# Patient Record
Sex: Female | Born: 1955 | Race: White | Hispanic: No | Marital: Married | Smoking: Never smoker
Health system: Southern US, Community
[De-identification: ages and names within clinical notes are randomized; demographics above are authoritative.]

## PROBLEM LIST (undated history)

## (undated) DIAGNOSIS — G4730 Sleep apnea, unspecified: Secondary | ICD-10-CM

## (undated) DIAGNOSIS — F419 Anxiety disorder, unspecified: Secondary | ICD-10-CM

## (undated) DIAGNOSIS — Z8719 Personal history of other diseases of the digestive system: Secondary | ICD-10-CM

## (undated) DIAGNOSIS — M62838 Other muscle spasm: Secondary | ICD-10-CM

## (undated) DIAGNOSIS — Z9889 Other specified postprocedural states: Secondary | ICD-10-CM

## (undated) DIAGNOSIS — L719 Rosacea, unspecified: Secondary | ICD-10-CM

## (undated) DIAGNOSIS — J302 Other seasonal allergic rhinitis: Secondary | ICD-10-CM

## (undated) DIAGNOSIS — I499 Cardiac arrhythmia, unspecified: Secondary | ICD-10-CM

## (undated) DIAGNOSIS — Z87442 Personal history of urinary calculi: Secondary | ICD-10-CM

## (undated) DIAGNOSIS — K219 Gastro-esophageal reflux disease without esophagitis: Secondary | ICD-10-CM

## (undated) DIAGNOSIS — Z87440 Personal history of urinary (tract) infections: Secondary | ICD-10-CM

## (undated) DIAGNOSIS — R112 Nausea with vomiting, unspecified: Secondary | ICD-10-CM

## (undated) DIAGNOSIS — F32A Depression, unspecified: Secondary | ICD-10-CM

## (undated) DIAGNOSIS — G4700 Insomnia, unspecified: Secondary | ICD-10-CM

## (undated) DIAGNOSIS — G629 Polyneuropathy, unspecified: Secondary | ICD-10-CM

## (undated) DIAGNOSIS — E039 Hypothyroidism, unspecified: Secondary | ICD-10-CM

## (undated) DIAGNOSIS — F329 Major depressive disorder, single episode, unspecified: Secondary | ICD-10-CM

## (undated) DIAGNOSIS — M549 Dorsalgia, unspecified: Secondary | ICD-10-CM

## (undated) DIAGNOSIS — E785 Hyperlipidemia, unspecified: Secondary | ICD-10-CM

## (undated) DIAGNOSIS — M2550 Pain in unspecified joint: Secondary | ICD-10-CM

## (undated) DIAGNOSIS — M2540 Effusion, unspecified joint: Secondary | ICD-10-CM

## (undated) DIAGNOSIS — G8929 Other chronic pain: Secondary | ICD-10-CM

## (undated) DIAGNOSIS — K5900 Constipation, unspecified: Secondary | ICD-10-CM

## (undated) DIAGNOSIS — M1990 Unspecified osteoarthritis, unspecified site: Secondary | ICD-10-CM

## (undated) DIAGNOSIS — N200 Calculus of kidney: Secondary | ICD-10-CM

## (undated) DIAGNOSIS — I209 Angina pectoris, unspecified: Secondary | ICD-10-CM

## (undated) DIAGNOSIS — Z8489 Family history of other specified conditions: Secondary | ICD-10-CM

## (undated) HISTORY — PX: KNEE SURGERY: SHX244

## (undated) HISTORY — PX: KNEE ARTHROSCOPY: SHX127

## (undated) HISTORY — PX: PATELLECTOMY: SHX1022

## (undated) HISTORY — PX: PLANTAR FASCIA SURGERY: SHX746

## (undated) HISTORY — PX: CARDIAC CATHETERIZATION: SHX172

## (undated) HISTORY — PX: JOINT REPLACEMENT: SHX530

## (undated) HISTORY — PX: LEFT OOPHORECTOMY: SHX1961

## (undated) HISTORY — PX: ABDOMINAL HYSTERECTOMY: SHX81

## (undated) HISTORY — PX: COLONOSCOPY: SHX174

## (undated) HISTORY — PX: ANAL FISSURE REPAIR: SHX2312

## (undated) HISTORY — PX: BARIATRIC SURGERY: SHX1103

## (undated) HISTORY — PX: ESOPHAGOGASTRODUODENOSCOPY: SHX1529

---

## 1979-01-04 HISTORY — PX: TUBAL LIGATION: SHX77

## 1997-11-26 ENCOUNTER — Encounter: Payer: Self-pay | Admitting: Family Medicine

## 1997-11-26 ENCOUNTER — Ambulatory Visit (HOSPITAL_COMMUNITY): Admission: RE | Admit: 1997-11-26 | Discharge: 1997-11-26 | Payer: Self-pay | Admitting: Family Medicine

## 1998-11-06 ENCOUNTER — Encounter: Payer: Self-pay | Admitting: Orthopedic Surgery

## 1998-11-06 ENCOUNTER — Encounter: Admission: RE | Admit: 1998-11-06 | Discharge: 1998-11-06 | Payer: Self-pay | Admitting: Orthopedic Surgery

## 1999-01-14 ENCOUNTER — Encounter: Admission: RE | Admit: 1999-01-14 | Discharge: 1999-03-22 | Payer: Self-pay | Admitting: Orthopedic Surgery

## 1999-08-04 ENCOUNTER — Ambulatory Visit (HOSPITAL_COMMUNITY): Admission: RE | Admit: 1999-08-04 | Discharge: 1999-08-04 | Payer: Self-pay | Admitting: Family Medicine

## 1999-08-04 ENCOUNTER — Encounter: Payer: Self-pay | Admitting: Family Medicine

## 1999-08-10 ENCOUNTER — Encounter: Payer: Self-pay | Admitting: Family Medicine

## 1999-08-10 ENCOUNTER — Ambulatory Visit (HOSPITAL_COMMUNITY): Admission: RE | Admit: 1999-08-10 | Discharge: 1999-08-10 | Payer: Self-pay | Admitting: Family Medicine

## 1999-08-11 ENCOUNTER — Encounter: Admission: RE | Admit: 1999-08-11 | Discharge: 1999-09-16 | Payer: Self-pay | Admitting: Family Medicine

## 2000-05-08 ENCOUNTER — Encounter: Admission: RE | Admit: 2000-05-08 | Discharge: 2000-05-08 | Payer: Self-pay | Admitting: Gynecology

## 2000-05-08 ENCOUNTER — Other Ambulatory Visit: Admission: RE | Admit: 2000-05-08 | Discharge: 2000-05-08 | Payer: Self-pay | Admitting: Gynecology

## 2000-05-08 ENCOUNTER — Encounter: Payer: Self-pay | Admitting: Gynecology

## 2000-12-19 ENCOUNTER — Encounter: Payer: Self-pay | Admitting: Podiatry

## 2000-12-19 ENCOUNTER — Encounter: Admission: RE | Admit: 2000-12-19 | Discharge: 2000-12-19 | Payer: Self-pay | Admitting: Podiatry

## 2001-01-26 ENCOUNTER — Encounter: Payer: Self-pay | Admitting: Neurological Surgery

## 2001-01-26 ENCOUNTER — Encounter: Admission: RE | Admit: 2001-01-26 | Discharge: 2001-01-26 | Payer: Self-pay | Admitting: Neurological Surgery

## 2001-01-27 ENCOUNTER — Encounter: Admission: RE | Admit: 2001-01-27 | Discharge: 2001-01-27 | Payer: Self-pay | Admitting: Neurological Surgery

## 2001-01-27 ENCOUNTER — Encounter: Payer: Self-pay | Admitting: Neurological Surgery

## 2001-08-06 ENCOUNTER — Other Ambulatory Visit: Admission: RE | Admit: 2001-08-06 | Discharge: 2001-08-06 | Payer: Self-pay | Admitting: Gynecology

## 2001-08-07 ENCOUNTER — Encounter: Admission: RE | Admit: 2001-08-07 | Discharge: 2001-08-07 | Payer: Self-pay | Admitting: Gynecology

## 2001-08-07 ENCOUNTER — Encounter: Payer: Self-pay | Admitting: Gynecology

## 2002-07-11 ENCOUNTER — Encounter: Admission: RE | Admit: 2002-07-11 | Discharge: 2002-07-11 | Payer: Self-pay | Admitting: Interventional Cardiology

## 2002-07-11 ENCOUNTER — Encounter: Payer: Self-pay | Admitting: Interventional Cardiology

## 2002-07-18 ENCOUNTER — Ambulatory Visit (HOSPITAL_COMMUNITY): Admission: RE | Admit: 2002-07-18 | Discharge: 2002-07-18 | Payer: Self-pay | Admitting: Interventional Cardiology

## 2002-10-11 ENCOUNTER — Encounter: Admission: RE | Admit: 2002-10-11 | Discharge: 2002-10-11 | Payer: Self-pay | Admitting: Family Medicine

## 2002-10-11 ENCOUNTER — Encounter: Payer: Self-pay | Admitting: Family Medicine

## 2003-04-01 ENCOUNTER — Ambulatory Visit (HOSPITAL_COMMUNITY): Admission: RE | Admit: 2003-04-01 | Discharge: 2003-04-01 | Payer: Self-pay | Admitting: Family Medicine

## 2003-10-16 ENCOUNTER — Encounter: Admission: RE | Admit: 2003-10-16 | Discharge: 2003-10-16 | Payer: Self-pay | Admitting: Family Medicine

## 2003-11-05 ENCOUNTER — Other Ambulatory Visit: Admission: RE | Admit: 2003-11-05 | Discharge: 2003-11-05 | Payer: Self-pay | Admitting: Gynecology

## 2004-06-04 ENCOUNTER — Ambulatory Visit: Admission: RE | Admit: 2004-06-04 | Discharge: 2004-06-04 | Payer: Self-pay | Admitting: Family Medicine

## 2004-08-02 ENCOUNTER — Inpatient Hospital Stay (HOSPITAL_COMMUNITY): Admission: RE | Admit: 2004-08-02 | Discharge: 2004-08-05 | Payer: Self-pay | Admitting: Orthopedic Surgery

## 2004-11-29 ENCOUNTER — Encounter: Admission: RE | Admit: 2004-11-29 | Discharge: 2004-11-29 | Payer: Self-pay | Admitting: Family Medicine

## 2005-01-03 HISTORY — PX: KNEE ARTHROPLASTY: SHX992

## 2005-07-11 ENCOUNTER — Other Ambulatory Visit: Admission: RE | Admit: 2005-07-11 | Discharge: 2005-07-11 | Payer: Self-pay | Admitting: Family Medicine

## 2005-07-20 ENCOUNTER — Ambulatory Visit (HOSPITAL_COMMUNITY): Admission: RE | Admit: 2005-07-20 | Discharge: 2005-07-20 | Payer: Self-pay | Admitting: Anesthesiology

## 2005-11-30 ENCOUNTER — Encounter: Admission: RE | Admit: 2005-11-30 | Discharge: 2005-11-30 | Payer: Self-pay | Admitting: Family Medicine

## 2006-12-04 ENCOUNTER — Encounter: Admission: RE | Admit: 2006-12-04 | Discharge: 2006-12-04 | Payer: Self-pay | Admitting: Family Medicine

## 2006-12-09 ENCOUNTER — Encounter: Admission: RE | Admit: 2006-12-09 | Discharge: 2006-12-09 | Payer: Self-pay | Admitting: Anesthesiology

## 2007-04-10 ENCOUNTER — Other Ambulatory Visit: Admission: RE | Admit: 2007-04-10 | Discharge: 2007-04-10 | Payer: Self-pay | Admitting: Gynecology

## 2007-04-20 ENCOUNTER — Encounter: Admission: RE | Admit: 2007-04-20 | Discharge: 2007-04-20 | Payer: Self-pay | Admitting: Gynecology

## 2007-07-26 ENCOUNTER — Ambulatory Visit (HOSPITAL_COMMUNITY): Admission: RE | Admit: 2007-07-26 | Discharge: 2007-07-26 | Payer: Self-pay | Admitting: Anesthesiology

## 2007-12-10 ENCOUNTER — Encounter: Admission: RE | Admit: 2007-12-10 | Discharge: 2007-12-10 | Payer: Self-pay | Admitting: Family Medicine

## 2008-07-19 ENCOUNTER — Emergency Department (HOSPITAL_COMMUNITY): Admission: EM | Admit: 2008-07-19 | Discharge: 2008-07-19 | Payer: Self-pay | Admitting: Emergency Medicine

## 2008-12-11 ENCOUNTER — Encounter: Admission: RE | Admit: 2008-12-11 | Discharge: 2008-12-11 | Payer: Self-pay | Admitting: Gynecology

## 2008-12-12 ENCOUNTER — Ambulatory Visit (HOSPITAL_COMMUNITY): Admission: RE | Admit: 2008-12-12 | Discharge: 2008-12-12 | Payer: Self-pay | Admitting: Anesthesiology

## 2009-06-30 ENCOUNTER — Ambulatory Visit (HOSPITAL_COMMUNITY): Admission: RE | Admit: 2009-06-30 | Discharge: 2009-06-30 | Payer: Self-pay | Admitting: General Surgery

## 2009-08-28 ENCOUNTER — Ambulatory Visit (HOSPITAL_COMMUNITY): Admission: RE | Admit: 2009-08-28 | Discharge: 2009-08-28 | Payer: Self-pay | Admitting: Anesthesiology

## 2009-12-30 ENCOUNTER — Encounter
Admission: RE | Admit: 2009-12-30 | Discharge: 2009-12-30 | Payer: Self-pay | Source: Home / Self Care | Attending: Family Medicine | Admitting: Family Medicine

## 2010-01-18 ENCOUNTER — Ambulatory Visit (HOSPITAL_COMMUNITY)
Admission: RE | Admit: 2010-01-18 | Discharge: 2010-01-18 | Payer: Self-pay | Source: Home / Self Care | Attending: Anesthesiology | Admitting: Anesthesiology

## 2010-01-24 ENCOUNTER — Encounter: Payer: Self-pay | Admitting: Family Medicine

## 2010-03-21 LAB — CBC
HCT: 36.8 % (ref 36.0–46.0)
Hemoglobin: 12.9 g/dL (ref 12.0–15.0)
MCH: 33.4 pg (ref 26.0–34.0)
MCHC: 35.1 g/dL (ref 30.0–36.0)
MCV: 95.2 fL (ref 78.0–100.0)
Platelets: 250 10*3/uL (ref 150–400)
RBC: 3.87 MIL/uL (ref 3.87–5.11)
RDW: 14.1 % (ref 11.5–15.5)
WBC: 6.4 10*3/uL (ref 4.0–10.5)

## 2010-03-21 LAB — SURGICAL PCR SCREEN
MRSA, PCR: NEGATIVE
Staphylococcus aureus: NEGATIVE

## 2010-03-21 LAB — DIFFERENTIAL
Basophils Absolute: 0 10*3/uL (ref 0.0–0.1)
Basophils Relative: 1 % (ref 0–1)
Eosinophils Absolute: 0.2 10*3/uL (ref 0.0–0.7)
Eosinophils Relative: 3 % (ref 0–5)
Lymphocytes Relative: 36 % (ref 12–46)
Lymphs Abs: 2.3 10*3/uL (ref 0.7–4.0)
Monocytes Absolute: 0.7 10*3/uL (ref 0.1–1.0)
Monocytes Relative: 11 % (ref 3–12)
Neutro Abs: 3.2 10*3/uL (ref 1.7–7.7)
Neutrophils Relative %: 50 % (ref 43–77)

## 2010-03-21 LAB — BASIC METABOLIC PANEL
BUN: 21 mg/dL (ref 6–23)
CO2: 25 mEq/L (ref 19–32)
Calcium: 9.6 mg/dL (ref 8.4–10.5)
Chloride: 104 mEq/L (ref 96–112)
Creatinine, Ser: 0.93 mg/dL (ref 0.4–1.2)
GFR calc Af Amer: >60 mL/min (ref >60)
GFR calc non Af Amer: >60 mL/min (ref >60)
Glucose, Bld: 101 mg/dL — ABNORMAL HIGH (ref 70–99)
Potassium: 4 mEq/L (ref 3.5–5.1)
Sodium: 138 mEq/L (ref 135–145)

## 2010-05-21 NOTE — Discharge Summary (Signed)
NAMEMACEE, VENABLES               ACCOUNT NO.:  [id number]   MEDICAL RECORD NO.:  [id number]          PATIENT TYPE:  INP   LOCATION:  1512                         FACILITY:  [facility]   PHYSICIAN:  John L. Rendall, M.D.  DATE OF BIRTH:  [birth year]   DATE OF ADMISSION:  08/02/2004  DATE OF DISCHARGE:  08/05/2004                                 DISCHARGE SUMMARY   ADMITTING DIAGNOSES:  1.  End-stage osteoarthritis of the left knee.  2.  Hypothyroidism.  3.  Obsessive-compulsive disorder.  4.  History of tachycardia with anxiety attacks.  5.  Gastric reflux disease with hiatal hernia.  6.  Chronic pain bilateral lower extremities.  7.  History of herniated disc T9-T10.  8.  Cervical myofascial pain.   DISCHARGE DIAGNOSES:  1.  Status post left total knee arthroplasty.  2.  Right knee pain.  3.  Hypothyroidism.  4.  Obsessive-compulsive disorder.  5.  History of tachycardia with anxiety attacks.  6.  History of gastric reflux/hiatal hernia.  7.  Chronic pain bilateral lower extremities.  8.  History of herniated disc T9-T10.  9.  History of cervical myofascial pain.  10. Acute blood loss anemia secondary to surgery, requiring no blood      products.  11. Poor pain control.  12. Constipation.   HISTORY OF PRESENT ILLNESS:  Ms. Villafuerte is a pleasant 55 year old white  female with bilateral knee pain left greater than right. The patient has  significant history of at least 13 knee surgeries which included bilateral  patellectomies. The patient's left knee pain has become worse over the past  year. She describes the pain as a constant electrical sharp pain with some  aching, throbbing sensations. Mechanical symptoms positive for giving way.  The patient does have waking pain. She uses a cane to ambulate. Pain  medications help with pain and the patient is currently seen by Dr. Vear Clock  to manage her lower leg pain and also cervical pain.   ALLERGIES:  MORPHINE, IBUPROFEN.  OXYCONTIN causes anxiety.   MEDICATIONS:  1.  Inderal LA 60 mg one p.o. daily.  2.  Synthroid 0.15 mg one p.o. daily.  3.  __________ 40 mg one p.o. daily.  4.  Alprazolam 0.25 mg p.r.n.  5.  Fentanyl patch 25 mcg per hour change every-other day.  6.  Vicodin 10/325 one tablet q.4h. p.r.n. pain.  7.  AcipHex 20 mg p.r.n.  8.  Allegra 180 mg p.r.n.  9.  Lidoderm patches 2 hours on, 2 hours off as needed for pain.  10. Aspirin 81 mg stopped 2 weeks prior to surgery.  11. Calcium 1200 mg plus vitamin D daily.  12. Dulcolax p.r.n. constipation.   SURGICAL PROCEDURE:  The patient was taken to the operating room on August 02, 2004 by Dr. Priscille Kluver, assisted by Arnoldo Morale, P.A. The patient was placed  under general anesthesia and a left total knee arthroplasty with computer  navigation was performed. The following components were used:  A medium  femoral component, a size 2.5 tibial keeled tray, and a 17.5 mm bearing. All  components were cemented. The patient tolerated the procedure well and  returned to the recovery room in good stable condition.   Postoperative day #1 the patient afebrile and vital signs stable. Pain under  good control. H&H 10.9 and 30.9; white count was 11,400. The patient  complaining of abdominal distention. Dulcolax 10 mg one PR was ordered. The  patient declined the suppository. Postoperative day #2 the patient had Tmax  of 100.0 degrees Fahrenheit. Otherwise, vital signs stable. The patient's  H&H 9.9 and 29.3. White count trended down at 9400. The patient with poor  pain control and therefore Dilaudid 2 mg one to two tablets p.o. q.4h.  p.r.n. pain was instituted. Postoperative day #3, the patient with good pain  control, had had a small bowel movement, continued to have some mild  epigastric tenderness. Dulcolax 10 mg per rectum was ordered. It is not  charted whether or not the patient had bowel movement prior to discharge.  The patient otherwise was afebrile  and vital signs stable. H&H was stable at  9.6 and 27.3. The patient had progressed well with physical therapy and was  discharged home.   LABORATORY DATA:  Routine labs on admission:  CBC:  All values within normal  limits. Coags:  All values within normal limits. Routine chemistries:  All  values within normal limits. Urinalysis on admission:  Color amber,  appearance was cloudy, specific gravity 1.035, bilirubin small, ketones  trace. HIV testing was nonreactive.   DISCHARGE INSTRUCTIONS:  Medications:  The patient was to resume home  medications except for Vicodin and no aspirin while on Arixtra. The patient  was to resume aspirin once Arixtra dosing was completed. The patient was  sent home on the following medications to be added to her routine regimen:  1.  Arixtra 2.5 mg one injection 8 p.m. x5 days.  2.  Robaxin 500 mg one to two tablets q.6-8h. for spasm.  3.  Lortab 10/650 one tablet q.4h. p.r.n. pain.  4.  Dilaudid 4 mg one tablet q.4h. for pain.  5.  Iron 325 mg one tablet per day x30 days.   Diet:  No restrictions.   Activity:  Weightbearing as tolerated with a walker.   Wound care:  The patient is to keep wound clean and dry. May shower after 2  days of no drainage. The patient is to call with temperatures greater than  101.5, any signs of infection, or if the pain is not controlled.   Special instructions:  1.  CPM 0-90 degrees 6-8 hours a day, increase by 10 degrees daily.  2.  Home health PT per [facility].   Follow up:  The patient needs a follow up appointment with Dr. Priscille Kluver in  the office approximately 10-11 days from time of discharge. The patient is  to call the office at [phone] for appointment.      Richardean Canal, P.A.      John L. Rendall, M.D.  Electronically Signed    GC/MEDQ  D:  08/25/2004  T:  08/25/2004  Job:  [id number]

## 2010-05-21 NOTE — H&P (Signed)
NAMESHERHONDA, GASPAR               ACCOUNT NO.:  [id number]   MEDICAL RECORD NO.:  [id number]          PATIENT TYPE:  INP   LOCATION:  NA                           FACILITY:  [facility]   PHYSICIAN:  John L. Rendall, M.D.  DATE OF BIRTH:  1955/03/15   DATE OF ADMISSION:  08/02/2004  DATE OF DISCHARGE:                                HISTORY & PHYSICAL   CHIEF COMPLAINT:  Bilateral knee pain, left greater than right.   HISTORY OF PRESENT ILLNESS:  Ms. Decoster is a pleasant 55 year old white  female with bilateral knee pain, left greater than right. The patient has a  significant history of at least 13 knee surgeries which included  patellectomy. The patient has had bilateral patellectomies. The pain in the  left knee became worse over the past year. She describes the pain as a  constant (electrical) sharp pain with some aching, throbbing sensations.  Mechanical symptoms positive for giving way. The patient does have waking  pain. She uses a cane to ambulate. Pain meds help with the pain and the  patient is seen by Dr. Vear Clock for her lower leg pain also cervical pain.   ALLERGIES:  MORPHINE, IBUPROFEN, and OXYCONTIN.  OxyContin causes anxiety.   MEDICATIONS:  1.  Inderal LA 60 mg, 1 p.o. daily.  2.  Synthroid 0.15 mg, 1 p.o. daily.  3.  Cymbalta 40 mg, 1 p.o. daily.  4.  Alprazolam 0.25 p.r.n.  5.  Fentanyl patch 25 mcg q._________ hour every other day.  6.  Vicodin 10/325, 1 q.4 h p.r.n. pain.  7.  AcipHex 20 mg p.r.n.  8.  Allegra 180 mg p.r.n.  9.  Lidoderm patches 2 hours on, 2 hours off as needed for pain.  10. Aspirin 81 mg, 1 daily, stopped approximately two weeks ago.  11. Calcium 1200 mg plus vitamin D daily.  12. Dulcolax p.r.n. constipation.   PAST MEDICAL HISTORY:  1.  Hypothyroidism.  2.  Obsessive compulsive disorder.  3.  Herniated disk at T9-T10.  4.  Chronic pain bilateral lower extremities.  5.  Cervical myofascial pain.  6.  GERD.  7.  Obesity.  8.  History  of tachycardia with anxiety attacks.  9.  Allergies.   PAST SURGICAL HISTORY:  1.  Right knee 1997 arthroscopy.  2.  1998 right knee patellectomy with reconstruction of extensor mechanism.  3.  December 2000 lateral femoral condyle lesion, lateral meniscectomy,      question arthroscopically _________ at [facility].  4.  Total of 13 knee surgeries on the left knee beginning in 1993, last      surgery 1999, all performed by Dr. Thomasena Edis. This included left      patellectomy.  5.  Tubal ligation in 1982.  6.  Oophorectomy in 1986.  7.  Hysterectomy, 1989.  8.  Surgery for plantar fasciitis left foot, 2004. The patient states that      she did have nausea with these procedures to general anesthesia      otherwise no complications.   SOCIAL HISTORY:  The patient denies any tobacco or alcohol use.  She is  married and has two grown children. Primary care physician  is Noberto Retort, M.D. of [facility], phone # is [phone]. The patient lives in a one story  home with three steps to the usual entrance. She is on disability.   FAMILY HISTORY:  The patient's mother is alive age 38 and has lower back  pain and hypothyroidism. The patient's father deceased at age 28 due to  hypertension and MI. She has one living brother who is age 33 and is  healthy. She has four sisters with significant medical history for  hypertension, hypothyroidism and the youngest sister has lupus.   REVIEW OF SYMPTOMS:  The patient denies any recent cold, cough, fever or flu-  like symptoms. No chest pain, shortness of breath, PND or orthopnea. She  wears glasses. She has gastric reflux otherwise review of systems negative  or noncontributory. The patient does not have a living will nor a Power-ofMultimedia programmer.   PHYSICAL EXAMINATION:  GENERAL:  The patient is a well-developed, well-  nourished female who walks with an antalgic gait. She states she is in pain  to ambulate. The patient _________ appropriately.  VITAL  SIGNS:  The patient's height 5 foot 6, weight 272 pounds. Temperature  98.2 degrees Fahrenheit, blood pressure 118/80, pulse 74, respiratory rate  16.  CARDIAC:  Regular rate and rhythm, no murmurs, rubs or gallops noted.  CHEST:  Breathing is nonlabored, no accessory muscles are used.  LUNGS:  Clear to auscultation bilaterally. No wheezing, rhonchi or rales  noted.  ABDOMEN:  Obese, soft, nontender to palpation. Bowel sounds slightly  hypoactive throughout.  NECK:  No lymphadenopathy, carotids are 2+ without bruits. The patient has  pain with extension and overall has some decreased range of motion of the  neck.  BACK:  The patient has tenderness about the level of T8 through T10 with  palpation. No pain with palpation over the lumbar vertebral column.  BREASTS/GENITOURINARY/RECTAL:  All deferred at this time.  NEUROLOGIC:  The patient is alert and oriented. Cranial nerves II-XII are  grossly intact. Lower extremity strength testing reveals 5/5 strength  throughout.  HEENT:  Head is normocephalic, atraumatic without frontal or maxillary sinus  tenderness to palpation. EOM's are intact. PERRLA. Sclera is nonicteric.  Conjunctiva is pink. No visible external ear deformities noted. TM's pearly  gray bilaterally. Nose, nasal septum midline, nasal mucosa is pink and moist  without polyps. Buccal mucosa is pink and moist. The patient has good  dentition. Tongue, uvula midline. Pharynx without erythema or exudate.  MUSCULOSKELETAL:  Upper extremities:  The patient's upper extremities are  equal and symmetric in size and shape. The patient had full range of motion  in the shoulders, elbows, wrists and hands without pain. Radial pulses are  2+ bilaterally otherwise the upper extremities are neuro, motor and  vascularly intact. Lower extremities:  The patient has full range of motion  of the bilateral hops. The patient does have minimal pain with internal rotation of the right hip. The patient  does have tenderness over the right  trochanteric region. No pain over the left trochanteric region. Next, left  knee 0 to 105 degrees of flexion. She has pain in the medial compartment and  to a lesser degree some pain of the lateral compartment with palpation. Mild  effusion. Passive range of motion reveals crepitus. Valgus and varus  stressing reveals no laxity. Varus deformity of 3-5 degrees. No erythema or  drawer. Anterior drawer  on the left knee is equivocal.  Right knee 0 to 120  degrees of flexion. No effusion, no edema, no findings of infection.  Palpation of the lateral joint line is greater than that of the medial joint  line. Does have crepitus with passive range of motion. Valgus and varus  stressing reveals no laxity. Anterior drawer is equivocal. The patient has  multiple well healed surgical incisions over both knees. Absent patella is  noted due to patellectomy. Lower extremities nonedematous. Dorsal pedal  pulses are 2+ bilaterally.   X-rays of the left knee shows end-stage osteoarthritis with near bone on  bone of the medial compartment with previous patellectomy.   IMPRESSION:  1.  Endstage osteoarthritis, left knee.  2.  Left knee osteoarthritis with history of patellectomy.  3.  Hypothyroidism.  4.  Obsessive compulsive disorder.  5.  History of tachycardia with anxiety attacks.  6.  Gastric reflux/hiatal hernia.  7.  Chronic pain bilateral lower extremities.  8.  Herniated disk at T9, T10.  9.  Cervical myofascial pain.   PLAN:  The patient is to be admitted to [facility] on August 02, 2004 to undergo a left total knee arthroplasty using computer navigation.  The patient will undergo preoperative labs and testing prior to surgery. The  patient did received preoperative clearance from her primary care physician,  Dr. Tiburcio Pea. Will follow instructions for pain management as prescribed by  Dr. Vear Clock. A copy of his note including those instructions is  included in  the patient's preop lab orders.       GC/MEDQ  D:  07/30/2004  T:  07/30/2004  Job:  [id number]

## 2010-05-21 NOTE — Cardiovascular Report (Signed)
NAME:  [name], [name]                         ACCOUNT NO.:  [id number]   MEDICAL RECORD NO.:  [id number]                   PATIENT TYPE:  OIB   LOCATION:  2899                                 FACILITY:  [facility]   PHYSICIAN:  Lesleigh Noe, M.D.            DATE OF BIRTH:  1955-12-17   DATE OF PROCEDURE:  07/18/2002  DATE OF DISCHARGE:                              CARDIAC CATHETERIZATION   INDICATIONS FOR PROCEDURE:  Atypical chest pain with subsequent Cardiolite  study demonstrating inferior wall abnormalities possibly related to  diaphragm attenuation.  This study is being done to rule out evidence of  myocardial ischemia in the absence of another explanation for her recurring  episodes of chest pain.   PROCEDURE PERFORMED:  1. Left heart catheterization.  2. Selective coronary angiography.  3. Left ventriculography.   DESCRIPTION:  After informed consent and using the modified Seldinger  technique a #6 French arterial sheath was placed in the right femoral artery  .  A #6 [place] AT multipurpose catheter was then used for hemodynamic  recordings in the aorta and LV, ventriculography, and right coronary  angiography.  A 3.5 cm left Judkins catheter was used for left coronary  angiography.  The patient tolerated the procedure without complications.   RESULTS:   I. HEMODYNAMIC DATA:  A.  Aortic pressure 148/89.  B.  Left ventricular pressure 148/14.   II. LEFT VENTRICULOGRAPHY:  Left ventricular cavity size and systolic  function are normal.  EF 75%.   III. CORONARY ANGIOGRAPHY:  A.  Left main coronary:  The left main is  normal.  B.  Left anterior descending coronary:  The LAD is a moderate sized vessel  that gives origin to a very proximally arising first diagonal that  bifurcates on the left lateral wall.  There is also a smaller second  diagonal that arises from the mid vessel.  The LAD reaches the left  ventricular apex, but does not course onto the distal inferior  wall.  The  left coronary is entirely normal.  C.  Circumflex artery:  Circumflex coronary artery give origin to 3 obtuse  marginal branches and is entirely normal.  D.  Right coronary:  The right coronary artery is normal.  It gives origin  to the PDA.   CONCLUSION:  1. Normal coronary arteries.  2. Normal left ventricular function.  3. Abnormal Cardiolite represents a false positive study, likely due to     diaphragmatic attenuation.  4. Chest pain, recurrent, noncardiac in origin.  Suspect either GI or     musculoskeletal.    PLAN:  No further cardiac evaluation.  Return care to primary care physician  with reference to further management of chest pain syndrome and other  specific evaluations for chest pain.  Lesleigh Noe, M.D.    HWS/MEDQ  D:  07/18/2002  T:  07/18/2002  Job:  [id number]  Holley Bouche, M.D.  [address]  [place], [place] [address]  Fax: [phone]   cc:   Holley Bouche, M.D.  [address]  [place], [place] [address]  Fax: [phone]

## 2010-05-21 NOTE — Op Note (Signed)
[name], [name]               ACCOUNT NO.:  [id number]   MEDICAL RECORD NO.:  [id number]          PATIENT TYPE:  INP   LOCATION:  X002                         [facility]ACILITY[facility]  [facility]   PHYSICIAN:  John L. Rendall, M.D.  DATE OF BIRTH:  1955-05-14   DATE OF PROCEDURE:  08/02/2004  DATE OF DISCHARGE:                                 OPERATIVE REPORT   PREOPERATIVE DIAGNOSIS:  Osteoarthritis left knee.   SURGICAL PROCEDURE:  Left LCS total knee replacement with computer  navigation assistance.   POSTOPERATIVE DIAGNOSIS:  Osteoarthritis, left knee.   SURGEON:  Dr. Priscille Kluver.   ASSISTANT:  Duffy, PA-C.   PATHOLOGY:  The patient has bone against bone medially, history of multiple  previous knee operations including a patellectomy.  She has, by computer, a  fixed 11 degree flexion contracture and at the end of the case, that came  out to 0 degrees with anatomic alignment within 1/2 degree on varus valgus  using computer nav.   PROCEDURE:  Under general anesthesia, the left leg is prepared with DuraPrep  and draped as a sterile field.  The leg is wrapped out with the Esmarch, and  the tourniquet is used at 375 mm.  The previous skin incision is excised,  and dissection is carried down to the capsule.  An incision is made medial  to the patellar tendon and then right up the middle of the old patellectomy  site, up the center of the quad tendon.  Dissection is carried around the  medial tibial plateau.  The central fat pad is removed.  The femur is sized  at a medium.  Debridement is then done in preparation for computer  navigation assistance.  Two Schanz pins are then placed in the medial tibia  superiorly and distal femur medially.  Once these are in place, the arrays  are set up.  The center of the femoral head is localized.  The medial and  lateral malleolus are found.  Proximal tibia and distal femur are mapped.  Once this is done, the proximal tibial resection is carried out with 8  mm  off the low side, 10 mm off the high side.  With this done, the ligaments  are balanced using the tensioner, and the remainder of the operation is  planned, planning for a medium femoral component.  The first femoral guide  is then used with navigation to resect anterior and posterior distal femur.  This is done within 1 degree of anatomic axis.  Distal femoral cut is then  made, again using navigation.  Recessing guide is then used.  A lamina  spreader is inserted.  Remnants of the cruciates and menisci are resected.  In the process of debriding the tibia, the popliteus tendon is taken down.  This immediately enlarged the joint space, and it went from a 12.5 to a 17  almost instantly.  However, this was a balanced 17.5.  The proximal tibia  was exposed, and a center PEG hole with keel was inserted.  A 17.5 bearing  was inserted, and the medium femur was inserted.  With  these in place, the  knee joint extended fully, and the knee was within 1/2 degree of anatomic  access.  Permanent components were then obtained while the bony surfaces  were prepared with pulse irrigation.  Permanent components were then  cemented in place.  While cement hardened, the complete synovectomy was  carried out.  Tourniquet was let down at  an hour and 15 minutes.  Multiple small vessels were cauterized.  Medium  Hemovac drain was inserted.  The knee was then closed in layers with #1  Tycron, 0 and 2-0 Vicryl, and skin clips.  Operative time approximately an  hour and 30 minutes.  The patient tolerated the procedure well and returned  to recovery in good condition.       JLR/MEDQ  D:  08/02/2004  T:  08/02/2004  Job:  [id number]

## 2010-05-26 ENCOUNTER — Other Ambulatory Visit (HOSPITAL_COMMUNITY): Payer: Self-pay | Admitting: Family Medicine

## 2010-05-26 ENCOUNTER — Ambulatory Visit (HOSPITAL_COMMUNITY)
Admission: RE | Admit: 2010-05-26 | Discharge: 2010-05-26 | Disposition: A | Discharge status: Home / Self Care | Payer: Medicare Other | Source: Ambulatory Visit | Attending: Family Medicine | Admitting: Family Medicine

## 2010-05-26 DIAGNOSIS — R52 Pain, unspecified: Secondary | ICD-10-CM

## 2010-05-26 DIAGNOSIS — R0789 Other chest pain: Secondary | ICD-10-CM | POA: Insufficient documentation

## 2010-12-16 ENCOUNTER — Other Ambulatory Visit: Payer: Self-pay | Admitting: Family Medicine

## 2010-12-16 DIAGNOSIS — Z1231 Encounter for screening mammogram for malignant neoplasm of breast: Secondary | ICD-10-CM

## 2011-01-13 ENCOUNTER — Ambulatory Visit
Admission: RE | Admit: 2011-01-13 | Discharge: 2011-01-13 | Disposition: A | Discharge status: Home / Self Care | Payer: Medicare Other | Source: Ambulatory Visit | Attending: Family Medicine | Admitting: Family Medicine

## 2011-01-13 DIAGNOSIS — Z1231 Encounter for screening mammogram for malignant neoplasm of breast: Secondary | ICD-10-CM

## 2011-04-15 ENCOUNTER — Other Ambulatory Visit: Payer: Self-pay | Admitting: Gastroenterology

## 2011-04-15 DIAGNOSIS — R1011 Right upper quadrant pain: Secondary | ICD-10-CM

## 2011-04-18 ENCOUNTER — Ambulatory Visit
Admission: RE | Admit: 2011-04-18 | Discharge: 2011-04-18 | Disposition: A | Discharge status: Home / Self Care | Payer: Medicare Other | Source: Ambulatory Visit | Attending: Gastroenterology | Admitting: Gastroenterology

## 2011-04-18 DIAGNOSIS — R1011 Right upper quadrant pain: Secondary | ICD-10-CM

## 2011-08-01 ENCOUNTER — Other Ambulatory Visit: Payer: Self-pay | Admitting: Orthopaedic Surgery

## 2011-08-01 DIAGNOSIS — M79605 Pain in left leg: Secondary | ICD-10-CM

## 2011-08-01 DIAGNOSIS — M545 Low back pain, unspecified: Secondary | ICD-10-CM

## 2011-08-01 DIAGNOSIS — M79604 Pain in right leg: Secondary | ICD-10-CM

## 2011-08-04 ENCOUNTER — Ambulatory Visit
Admission: RE | Admit: 2011-08-04 | Discharge: 2011-08-04 | Disposition: A | Discharge status: Home / Self Care | Payer: Medicare Other | Source: Ambulatory Visit | Attending: Orthopaedic Surgery | Admitting: Orthopaedic Surgery

## 2011-08-04 DIAGNOSIS — M545 Low back pain, unspecified: Secondary | ICD-10-CM

## 2011-08-04 DIAGNOSIS — M79605 Pain in left leg: Secondary | ICD-10-CM

## 2011-08-08 ENCOUNTER — Other Ambulatory Visit (HOSPITAL_COMMUNITY): Payer: Self-pay | Admitting: Orthopaedic Surgery

## 2011-08-08 DIAGNOSIS — Z96659 Presence of unspecified artificial knee joint: Secondary | ICD-10-CM

## 2011-08-08 DIAGNOSIS — T84038A Mechanical loosening of other internal prosthetic joint, initial encounter: Secondary | ICD-10-CM

## 2011-08-12 ENCOUNTER — Encounter (HOSPITAL_COMMUNITY)
Admission: RE | Admit: 2011-08-12 | Discharge: 2011-08-12 | Disposition: A | Discharge status: Home / Self Care | Payer: Medicare Other | Source: Ambulatory Visit | Attending: Orthopaedic Surgery | Admitting: Orthopaedic Surgery

## 2011-08-12 ENCOUNTER — Ambulatory Visit (HOSPITAL_COMMUNITY)
Admission: RE | Admit: 2011-08-12 | Discharge: 2011-08-12 | Disposition: A | Discharge status: Home / Self Care | Payer: Medicare Other | Source: Ambulatory Visit | Attending: Orthopaedic Surgery | Admitting: Orthopaedic Surgery

## 2011-08-12 DIAGNOSIS — M25569 Pain in unspecified knee: Secondary | ICD-10-CM | POA: Insufficient documentation

## 2011-08-12 DIAGNOSIS — T84038A Mechanical loosening of other internal prosthetic joint, initial encounter: Secondary | ICD-10-CM

## 2011-08-12 DIAGNOSIS — Z96659 Presence of unspecified artificial knee joint: Secondary | ICD-10-CM

## 2011-08-12 DIAGNOSIS — M1710 Unilateral primary osteoarthritis, unspecified knee: Secondary | ICD-10-CM | POA: Insufficient documentation

## 2011-08-12 DIAGNOSIS — IMO0002 Reserved for concepts with insufficient information to code with codable children: Secondary | ICD-10-CM | POA: Insufficient documentation

## 2011-08-12 MED ORDER — TECHNETIUM TC 99M MEDRONATE IV KIT
25.0000 | PACK | Freq: Once | INTRAVENOUS | Status: AC | PRN
  Administered 2011-08-12: 25 via INTRAVENOUS

## 2011-10-18 ENCOUNTER — Encounter (HOSPITAL_COMMUNITY): Payer: Self-pay | Admitting: Pharmacy Technician

## 2011-10-20 ENCOUNTER — Encounter (HOSPITAL_COMMUNITY)
Admission: RE | Admit: 2011-10-20 | Discharge: 2011-10-20 | Disposition: A | Discharge status: Home / Self Care | Payer: Medicare Other | Source: Ambulatory Visit | Attending: Orthopaedic Surgery | Admitting: Orthopaedic Surgery

## 2011-10-20 ENCOUNTER — Encounter (HOSPITAL_COMMUNITY): Payer: Self-pay

## 2011-10-20 ENCOUNTER — Ambulatory Visit (HOSPITAL_COMMUNITY)
Admission: RE | Admit: 2011-10-20 | Discharge: 2011-10-20 | Disposition: A | Discharge status: Home / Self Care | Payer: Medicare Other | Source: Ambulatory Visit | Attending: Orthopaedic Surgery | Admitting: Orthopaedic Surgery

## 2011-10-20 DIAGNOSIS — Z01818 Encounter for other preprocedural examination: Secondary | ICD-10-CM | POA: Insufficient documentation

## 2011-10-20 HISTORY — DX: Personal history of urinary (tract) infections: Z87.440

## 2011-10-20 HISTORY — DX: Unspecified osteoarthritis, unspecified site: M19.90

## 2011-10-20 HISTORY — DX: Rosacea, unspecified: L71.9

## 2011-10-20 HISTORY — DX: Dorsalgia, unspecified: M54.9

## 2011-10-20 HISTORY — DX: Cardiac arrhythmia, unspecified: I49.9

## 2011-10-20 HISTORY — DX: Nausea with vomiting, unspecified: R11.2

## 2011-10-20 HISTORY — DX: Insomnia, unspecified: G47.00

## 2011-10-20 HISTORY — DX: Personal history of other diseases of the digestive system: Z87.19

## 2011-10-20 HISTORY — DX: Other muscle spasm: M62.838

## 2011-10-20 HISTORY — DX: Personal history of urinary calculi: Z87.442

## 2011-10-20 HISTORY — DX: Polyneuropathy, unspecified: G62.9

## 2011-10-20 HISTORY — DX: Constipation, unspecified: K59.00

## 2011-10-20 HISTORY — DX: Hyperlipidemia, unspecified: E78.5

## 2011-10-20 HISTORY — DX: Nausea with vomiting, unspecified: Z98.890

## 2011-10-20 HISTORY — DX: Gastro-esophageal reflux disease without esophagitis: K21.9

## 2011-10-20 HISTORY — DX: Other seasonal allergic rhinitis: J30.2

## 2011-10-20 HISTORY — DX: Other chronic pain: G89.29

## 2011-10-20 HISTORY — DX: Anxiety disorder, unspecified: F41.9

## 2011-10-20 HISTORY — DX: Pain in unspecified joint: M25.50

## 2011-10-20 HISTORY — DX: Effusion, unspecified joint: M25.40

## 2011-10-20 HISTORY — DX: Hypothyroidism, unspecified: E03.9

## 2011-10-20 LAB — CBC
HCT: 38.6 % (ref 36.0–46.0)
Hemoglobin: 13.4 g/dL (ref 12.0–15.0)
MCH: 32.5 pg (ref 26.0–34.0)
MCHC: 34.7 g/dL (ref 30.0–36.0)
MCV: 93.7 fL (ref 78.0–100.0)
Platelets: 283 10*3/uL (ref 150–400)
RBC: 4.12 MIL/uL (ref 3.87–5.11)
RDW: 13 % (ref 11.5–15.5)
WBC: 8.9 10*3/uL (ref 4.0–10.5)

## 2011-10-20 LAB — BASIC METABOLIC PANEL
BUN: 17 mg/dL (ref 6–23)
CO2: 27 mEq/L (ref 19–32)
Calcium: 9.7 mg/dL (ref 8.4–10.5)
Chloride: 105 mEq/L (ref 96–112)
Creatinine, Ser: 0.97 mg/dL (ref 0.50–1.10)
GFR calc Af Amer: 74 mL/min — ABNORMAL LOW (ref >90)
GFR calc non Af Amer: 64 mL/min — ABNORMAL LOW (ref >90)
Glucose, Bld: 97 mg/dL (ref 70–99)
Potassium: 3.9 mEq/L (ref 3.5–5.1)
Sodium: 142 mEq/L (ref 135–145)

## 2011-10-20 LAB — SURGICAL PCR SCREEN
MRSA, PCR: NEGATIVE
Staphylococcus aureus: NEGATIVE

## 2011-10-20 LAB — TYPE AND SCREEN
ABO/RH(D): A POS
Antibody Screen: NEGATIVE

## 2011-10-20 LAB — ABO/RH: ABO/RH(D): A POS

## 2011-10-20 NOTE — Progress Notes (Signed)
Dr.Smith is cardiologist-last visit in Mar 2013-sees him on an prn basis-to request last visit  Stress test done in 2004 Heart cath in 2004-report in epic Echo done in 2013-to request report from Dr.Smith  EKG to be requested from Dr.Smith  Medical Md is Dr.Randy Tiburcio Pea with [facility]

## 2011-10-20 NOTE — Pre-Procedure Instructions (Signed)
20 [name]  10/20/2011   Your procedure is scheduled on:  Tues, Oct 29 @ 7:30 AM  Report to [facility] at 5:30 AM.  Call this number if you have problems the morning of surgery: [phone]   Remember:   Do not eat food:After Midnight.  Take these medicines the morning of surgery with A SIP OF WATER: Allegra(Fexodenadine),Pain Pill(if needed),Omeprazole(Prilosec),Paroxetine(Paxil),Propranolol(Inderal),and Synthroid(Levothyroxine)   Do not wear jewelry, make-up or nail polish.  Do not wear lotions, powders, or perfumes. You may wear deodorant.  Do not shave 48 hours prior to surgery.   Do not bring valuables to the hospital.  Contacts, dentures or bridgework may not be worn into surgery.  Leave suitcase in the car. After surgery it may be brought to your room.  For patients admitted to the hospital, checkout time is 11:00 AM the day of discharge.   Patients discharged the day of surgery will not be allowed to drive home.    Special Instructions: Shower using CHG 2 nights before surgery and the night before surgery.  If you shower the day of surgery use CHG.  Use special wash - you have one bottle of CHG for all showers.  You should use approximately 1/3 of the bottle for each shower.   Please read over the following fact sheets that you were given: Pain Booklet, Coughing and Deep Breathing, Blood Transfusion Information, Total Joint Packet, MRSA Information and Surgical Site Infection Prevention

## 2011-10-21 NOTE — Progress Notes (Signed)
Orders are  In this chart ... needs list on  Front of chart for day of surgery.

## 2011-10-31 MED ORDER — CHLORHEXIDINE GLUCONATE 4 % EX LIQD: 60.0000 mL | Freq: Every day | CUTANEOUS | Status: DC

## 2011-10-31 MED ORDER — SODIUM CHLORIDE 0.9 % IV SOLN: INTRAVENOUS | Status: DC

## 2011-10-31 MED ORDER — CHLORHEXIDINE GLUCONATE 4 % EX LIQD: 60.0000 mL | Freq: Once | CUTANEOUS | Status: DC

## 2011-10-31 MED ORDER — ACETAMINOPHEN 10 MG/ML IV SOLN
1000.0000 mg | INTRAVENOUS | Status: AC
  Administered 2011-11-01: 1000 mg via INTRAVENOUS
  Filled 2011-10-31: qty 100

## 2011-10-31 NOTE — H&P (Signed)
CHIEF COMPLAINT:  Painful left total knee replacement.   HISTORY OF PRESENT ILLNESS:  [name] is a very pleasant 56 year old white female who returns today for followup of her left total knee arthroplasty.  On 08/02/2004, she had a left total knee replacement by Dr. Priscille Kluver.  She had seen him several times in regard to pain in the left knee; however, over the past several years it has been worsening.  In 2011 there was a bone scan that showed consistent with synovitis of the knee without loosening of the components.  There was a normal sed rate and CRP.  She did not have any history of injury or trauma, but at that time she was taking care of her mother and was doing a lot of extra work.  She had developed pain along the medial tibial area mainly.  She, however, then saw [place], and we chose to obtain a bone scan as well as aspiration of the joint with routine laboratory studies.  She has noted that the bone scan was consistent with loosening of the tibial and femoral components of the total knee prosthesis.  The laboratory studies showed a sed rate of 16 and a C-reactive protein of less than 0.5.  She had a normal white count and hemoglobin.  Aspiration of the knee revealed 2615 white cells, 40 neutrophils, 33 monocytes, 20 lymphs, and 7 eos.  Glucose was 53.  Protein was less than 3.  Cultures were negative.  Bone density was then ordered, and that has showed to be essentially normal with a T score at minus 0.1.  She continues to have pain and discomfort in the knee, and she returns today for reevaluation.   PAST MEDICAL HISTORY:  Past medical history and general health are good.   PAST SURGICAL HISTORY:  Hospitalizations have included that of a left oophorectomy for a dermoid cyst, tubal ligation which was done laparoscopically, a total abdominal hysterectomy, 16 left knee surgeries, left total knee arthroplasty in 2006, 2 knee surgeries on the right, 2 bilateral foot surgeries, a fissure removal rectally,  childbirth x2, 1 in 1979 and 1 in 1981.   MEDICATIONS:  Include Synthroid 150 mcg daily, Inderal 80 mg daily, Paxil CR 50 mg daily, Dilaudid 4 mg every 6 hours p.r.n., Xanax 0.5 p.r.n., aspirin 81 mg daily, Prilosec 20 mg daily, Allegra 20 mg p.r.n., Robaxin 500 mg p.r.n., Dulcolax p.r.n., Lipitor daily, rosacea medication daily.   ALLERGIES:  MORPHINE which gives her a feeling of ants crawling on her skin.  Again, Dilaudid is not a problem.  She also gets GI upset with IBUPROFEN, NAPROXEN, and CELEBREX.  Apparently she was given TORADOL, and there was an allergic reaction in the fact that she had hematuria.   FOURTEEN-POINT REVIEW OF SYSTEMS:  Positive for glasses.  She does have a history of chest pain which was felt to be on the basis of anxiety as per Dr. Katrinka Blazing, the cardiologist.  She does have weekly palpitations.  She did have a peptic ulcer in 1985.  Blood in her stools, hemorrhoids, and a fissure were also noted and corrected surgically.  She has hypothyroidism since age 72 and is on Synthroid.  She did have anemia with her knee replacement.  Fifteen years ago she had kidney stones as well as infections.  Hematuria was secondary to Toradol.  She urinates at night at least 3 times.  She does have a history of depression and nervous anxiety.   FAMILY HISTORY:  Positive for her mother  who died at 42 years of age, father who died at 1 years of age from myocardial infarction.  She has 1 living brother who is 42 and 4 living sisters ages 10, 39, 13, and 76.   SOCIAL HISTORY:  [name] is a very pleasant 56 year old, white, married female who is Social Security disability.  She does not smoke or drink.   PHYSICAL EXAMINATION:  Today reveals a height of 5 feet 6 inches, weight of 281 pounds.  BMI is 45.3.  Vital signs reveal a temperature of 98.7, pulse 66, respirations 16, blood pressure 123/69. Head:  Normocephalic. Eyes:  Pupils equal, round, and reactive to light and accommodation with extraocular  movements intact. Ears/Nose/Throat:  Benign. Neck:  Supple.  No bruits were noted. Chest:  Had good expansion. Lungs:  Clear. Cardiac:  Had a regular rhythm and rate.  Normal S1, S2.  No murmurs were noted. Pulses:  Were 1+, bilateral and symmetric, in the lower extremities. Abdomen:  Obese, soft, nontender, and no masses palpable.  Normal bowel sounds present. Genital/Rectal/Breast:  Not indicated for the procedure. CNS:  She is oriented x3, and cranial nerves II through XII are grossly intact. Musculoskeletal:  Today she has range of motion from 0 to 115 degrees.  She does have an audible and palpable click.  She does have a 1+ effusion.  Diffusely tender about the medial proximal tibia.   CLINICAL IMPRESSION:  Loosened, painful left total knee replacement.   RECOMMENDATIONS:  At this time I have read her clearances, which we have obtained.  It is felt that she is of appropriate condition from a medical and cardiac standpoint for total knee replacement.  Our plan is with the evaluation by Dr. Tiburcio Pea that we will proceed with revision total knee replacement versus antibiotic spacer.  The procedure, risks, and benefits were fully explained to her, and she is understanding.  Certainly if there is an infected process, then she will have the antibiotic spacer.  If not, we will go straight to a revision left total knee arthroplasty.  She is understanding of this.     Oris Drone Aleda Grana [facility] [phone]  10/31/2011 5:36 PM

## 2011-11-01 ENCOUNTER — Encounter (HOSPITAL_COMMUNITY): Payer: Self-pay | Admitting: General Practice

## 2011-11-01 ENCOUNTER — Encounter (HOSPITAL_COMMUNITY): Payer: Self-pay | Admitting: Surgery

## 2011-11-01 ENCOUNTER — Inpatient Hospital Stay (HOSPITAL_COMMUNITY): Payer: Medicare Other | Admitting: Anesthesiology

## 2011-11-01 ENCOUNTER — Encounter (HOSPITAL_COMMUNITY): Payer: Self-pay | Admitting: Anesthesiology

## 2011-11-01 ENCOUNTER — Encounter (HOSPITAL_COMMUNITY)
Admission: RE | Disposition: A | Discharge status: Home Health | Payer: Self-pay | Source: Ambulatory Visit | Attending: Orthopaedic Surgery

## 2011-11-01 ENCOUNTER — Inpatient Hospital Stay (HOSPITAL_COMMUNITY)
Admission: RE | Admit: 2011-11-01 | Discharge: 2011-11-03 | DRG: 467 | Disposition: A | Discharge status: Home Health | Payer: Medicare Other | Source: Ambulatory Visit | Attending: Orthopaedic Surgery | Admitting: Orthopaedic Surgery

## 2011-11-01 DIAGNOSIS — T84039A Mechanical loosening of unspecified internal prosthetic joint, initial encounter: Principal | ICD-10-CM | POA: Diagnosis present

## 2011-11-01 DIAGNOSIS — G8929 Other chronic pain: Secondary | ICD-10-CM

## 2011-11-01 DIAGNOSIS — M25562 Pain in left knee: Secondary | ICD-10-CM

## 2011-11-01 DIAGNOSIS — F411 Generalized anxiety disorder: Secondary | ICD-10-CM | POA: Diagnosis present

## 2011-11-01 DIAGNOSIS — Z96659 Presence of unspecified artificial knee joint: Secondary | ICD-10-CM

## 2011-11-01 DIAGNOSIS — M549 Dorsalgia, unspecified: Secondary | ICD-10-CM | POA: Diagnosis present

## 2011-11-01 DIAGNOSIS — E039 Hypothyroidism, unspecified: Secondary | ICD-10-CM | POA: Diagnosis present

## 2011-11-01 DIAGNOSIS — D62 Acute posthemorrhagic anemia: Secondary | ICD-10-CM | POA: Diagnosis not present

## 2011-11-01 DIAGNOSIS — K219 Gastro-esophageal reflux disease without esophagitis: Secondary | ICD-10-CM | POA: Diagnosis present

## 2011-11-01 DIAGNOSIS — Y849 Medical procedure, unspecified as the cause of abnormal reaction of the patient, or of later complication, without mention of misadventure at the time of the procedure: Secondary | ICD-10-CM | POA: Diagnosis present

## 2011-11-01 DIAGNOSIS — Z7982 Long term (current) use of aspirin: Secondary | ICD-10-CM

## 2011-11-01 DIAGNOSIS — Z6841 Body Mass Index (BMI) 40.0 and over, adult: Secondary | ICD-10-CM

## 2011-11-01 DIAGNOSIS — T8484XA Pain due to internal orthopedic prosthetic devices, implants and grafts, initial encounter: Secondary | ICD-10-CM

## 2011-11-01 HISTORY — PX: REVISION TOTAL KNEE ARTHROPLASTY: SUR1280

## 2011-11-01 HISTORY — DX: Angina pectoris, unspecified: I20.9

## 2011-11-01 HISTORY — PX: TOTAL KNEE REVISION: SHX996

## 2011-11-01 HISTORY — DX: Calculus of kidney: N20.0

## 2011-11-01 LAB — HEPATIC FUNCTION PANEL
ALT: 17 U/L (ref 0–35)
AST: 21 U/L (ref 0–37)
Albumin: 4 g/dL (ref 3.5–5.2)
Alkaline Phosphatase: 65 U/L (ref 39–117)
Bilirubin, Direct: 0.1 mg/dL (ref 0.0–0.3)
Indirect Bilirubin: 0.6 mg/dL (ref 0.3–0.9)
Total Bilirubin: 0.7 mg/dL (ref 0.3–1.2)
Total Protein: 7.4 g/dL (ref 6.0–8.3)

## 2011-11-01 LAB — CBC
HCT: 37.7 % (ref 36.0–46.0)
Hemoglobin: 12.5 g/dL (ref 12.0–15.0)
MCH: 31.3 pg (ref 26.0–34.0)
MCHC: 33.2 g/dL (ref 30.0–36.0)
MCV: 94.5 fL (ref 78.0–100.0)
Platelets: 257 10*3/uL (ref 150–400)
RBC: 3.99 MIL/uL (ref 3.87–5.11)
RDW: 13 % (ref 11.5–15.5)
WBC: 5.8 10*3/uL (ref 4.0–10.5)

## 2011-11-01 LAB — PROTIME-INR
INR: 1.06 (ref 0.00–1.49)
Prothrombin Time: 13.7 seconds (ref 11.6–15.2)

## 2011-11-01 LAB — URINE MICROSCOPIC-ADD ON

## 2011-11-01 LAB — URINALYSIS, ROUTINE W REFLEX MICROSCOPIC
Bilirubin Urine: NEGATIVE
Glucose, UA: NEGATIVE mg/dL
Hgb urine dipstick: NEGATIVE
Ketones, ur: NEGATIVE mg/dL
Nitrite: NEGATIVE
Protein, ur: NEGATIVE mg/dL
Specific Gravity, Urine: 1.022 (ref 1.005–1.030)
Urobilinogen, UA: 0.2 mg/dL (ref 0.0–1.0)
pH: 5.5 (ref 5.0–8.0)

## 2011-11-01 LAB — APTT: aPTT: 30 seconds (ref 24–37)

## 2011-11-01 SURGERY — TOTAL KNEE REVISION
Anesthesia: General | Site: Knee | Laterality: Left | Wound class: Clean

## 2011-11-01 MED ORDER — PROPRANOLOL HCL ER 80 MG PO CP24
80.0000 mg | ORAL_CAPSULE | Freq: Every day | ORAL | Status: DC
  Filled 2011-11-01 (×2): qty 1

## 2011-11-01 MED ORDER — ESMOLOL HCL 10 MG/ML IV SOLN
INTRAVENOUS | Status: DC | PRN
  Administered 2011-11-01: 20 mg via INTRAVENOUS

## 2011-11-01 MED ORDER — BISACODYL 10 MG RE SUPP: 10.0000 mg | Freq: Every day | RECTAL | Status: DC | PRN

## 2011-11-01 MED ORDER — BUPIVACAINE-EPINEPHRINE PF 0.25-1:200000 % IJ SOLN
INTRAMUSCULAR | Status: AC
  Filled 2011-11-01: qty 30

## 2011-11-01 MED ORDER — VANCOMYCIN HCL 1000 MG IV SOLR
INTRAVENOUS | Status: AC
  Filled 2011-11-01: qty 3000

## 2011-11-01 MED ORDER — GLYCOPYRROLATE 0.2 MG/ML IJ SOLN
INTRAMUSCULAR | Status: DC | PRN
  Administered 2011-11-01: 0.6 mg via INTRAVENOUS

## 2011-11-01 MED ORDER — METOCLOPRAMIDE HCL 5 MG/ML IJ SOLN
5.0000 mg | Freq: Three times a day (TID) | INTRAMUSCULAR | Status: DC | PRN
Start: 2011-11-01 — End: 2011-11-03

## 2011-11-01 MED ORDER — CEFAZOLIN SODIUM-DEXTROSE 2-3 GM-% IV SOLR
2.0000 g | Freq: Four times a day (QID) | INTRAVENOUS | Status: AC
  Administered 2011-11-01 (×2): 2 g via INTRAVENOUS
  Filled 2011-11-01 (×2): qty 50

## 2011-11-01 MED ORDER — ATORVASTATIN CALCIUM 10 MG PO TABS
10.0000 mg | ORAL_TABLET | Freq: Every day | ORAL | Status: DC
  Administered 2011-11-01 – 2011-11-02 (×2): 10 mg via ORAL
  Filled 2011-11-01 (×3): qty 1

## 2011-11-01 MED ORDER — ALPRAZOLAM 0.5 MG PO TABS
0.5000 mg | ORAL_TABLET | Freq: Every evening | ORAL | Status: DC | PRN
  Filled 2011-11-01: qty 1

## 2011-11-01 MED ORDER — LEVOTHYROXINE SODIUM 150 MCG PO TABS
150.0000 ug | ORAL_TABLET | Freq: Every day | ORAL | Status: DC
  Administered 2011-11-02 – 2011-11-03 (×2): 150 ug via ORAL
  Filled 2011-11-01 (×3): qty 1

## 2011-11-01 MED ORDER — MIDAZOLAM HCL 5 MG/5ML IJ SOLN
INTRAMUSCULAR | Status: DC | PRN
  Administered 2011-11-01: 2 mg via INTRAVENOUS

## 2011-11-01 MED ORDER — METOCLOPRAMIDE HCL 10 MG PO TABS: 5.0000 mg | ORAL_TABLET | Freq: Three times a day (TID) | ORAL | Status: DC | PRN

## 2011-11-01 MED ORDER — ONDANSETRON HCL 4 MG/2ML IJ SOLN: 4.0000 mg | Freq: Four times a day (QID) | INTRAMUSCULAR | Status: DC | PRN

## 2011-11-01 MED ORDER — MENTHOL 3 MG MT LOZG: 1.0000 | LOZENGE | OROMUCOSAL | Status: DC | PRN

## 2011-11-01 MED ORDER — SODIUM CHLORIDE 0.9 % IV SOLN
INTRAVENOUS | Status: DC
  Administered 2011-11-01: 19:00:00 via INTRAVENOUS

## 2011-11-01 MED ORDER — METHOCARBAMOL 500 MG PO TABS
500.0000 mg | ORAL_TABLET | Freq: Four times a day (QID) | ORAL | Status: DC | PRN
  Administered 2011-11-02 – 2011-11-03 (×3): 500 mg via ORAL
  Filled 2011-11-01 (×5): qty 1

## 2011-11-01 MED ORDER — RIVAROXABAN 10 MG PO TABS
10.0000 mg | ORAL_TABLET | Freq: Every day | ORAL | Status: DC
  Administered 2011-11-02 – 2011-11-03 (×2): 10 mg via ORAL
  Filled 2011-11-01 (×2): qty 1

## 2011-11-01 MED ORDER — ARTIFICIAL TEARS OP OINT
TOPICAL_OINTMENT | OPHTHALMIC | Status: DC | PRN
  Administered 2011-11-01: 1 via OPHTHALMIC

## 2011-11-01 MED ORDER — LIDOCAINE HCL (CARDIAC) 20 MG/ML IV SOLN: INTRAVENOUS | Status: DC | PRN

## 2011-11-01 MED ORDER — ROCURONIUM BROMIDE 100 MG/10ML IV SOLN
INTRAVENOUS | Status: DC | PRN
  Administered 2011-11-01: 50 mg via INTRAVENOUS

## 2011-11-01 MED ORDER — PANTOPRAZOLE SODIUM 40 MG PO TBEC
40.0000 mg | DELAYED_RELEASE_TABLET | Freq: Every day | ORAL | Status: DC
  Administered 2011-11-02 – 2011-11-03 (×2): 40 mg via ORAL
  Filled 2011-11-01 (×2): qty 1

## 2011-11-01 MED ORDER — ONDANSETRON HCL 4 MG PO TABS: 4.0000 mg | ORAL_TABLET | Freq: Four times a day (QID) | ORAL | Status: DC | PRN

## 2011-11-01 MED ORDER — CEFAZOLIN SODIUM-DEXTROSE 2-3 GM-% IV SOLR
INTRAVENOUS | Status: AC
  Filled 2011-11-01: qty 50

## 2011-11-01 MED ORDER — LABETALOL HCL 5 MG/ML IV SOLN
INTRAVENOUS | Status: DC | PRN
  Administered 2011-11-01 (×4): 5 mg via INTRAVENOUS

## 2011-11-01 MED ORDER — HYDROMORPHONE HCL 2 MG PO TABS
2.0000 mg | ORAL_TABLET | ORAL | Status: DC | PRN
  Administered 2011-11-01 – 2011-11-02 (×4): 2 mg via ORAL
  Filled 2011-11-01 (×4): qty 1

## 2011-11-01 MED ORDER — FENTANYL CITRATE 0.05 MG/ML IJ SOLN
INTRAMUSCULAR | Status: DC | PRN
  Administered 2011-11-01 (×6): 50 ug via INTRAVENOUS
  Administered 2011-11-01 (×2): 100 ug via INTRAVENOUS

## 2011-11-01 MED ORDER — FENTANYL CITRATE 0.05 MG/ML IJ SOLN
INTRAMUSCULAR | Status: AC
  Filled 2011-11-01: qty 2

## 2011-11-01 MED ORDER — ACETAMINOPHEN 10 MG/ML IV SOLN
INTRAVENOUS | Status: AC
Start: 2011-11-01 — End: 2011-11-01
  Filled 2011-11-01: qty 100

## 2011-11-01 MED ORDER — PHENOL 1.4 % MT LIQD: 1.0000 | OROMUCOSAL | Status: DC | PRN

## 2011-11-01 MED ORDER — FENTANYL CITRATE 0.05 MG/ML IJ SOLN
25.0000 ug | INTRAMUSCULAR | Status: DC | PRN
  Administered 2011-11-01: 25 ug via INTRAVENOUS
  Administered 2011-11-01 (×2): 50 ug via INTRAVENOUS
  Administered 2011-11-01: 25 ug via INTRAVENOUS

## 2011-11-01 MED ORDER — EPHEDRINE SULFATE 50 MG/ML IJ SOLN
INTRAMUSCULAR | Status: DC | PRN
  Administered 2011-11-01: 5 mg via INTRAVENOUS
  Administered 2011-11-01: 10 mg via INTRAVENOUS
  Administered 2011-11-01: 15 mg via INTRAVENOUS
  Administered 2011-11-01: 10 mg via INTRAVENOUS
  Administered 2011-11-01: 25 mg via INTRAVENOUS
  Administered 2011-11-01: 10 mg via INTRAVENOUS

## 2011-11-01 MED ORDER — METHOCARBAMOL 100 MG/ML IJ SOLN
500.0000 mg | Freq: Four times a day (QID) | INTRAVENOUS | Status: DC | PRN
  Administered 2011-11-01: 500 mg via INTRAVENOUS
  Filled 2011-11-01: qty 5

## 2011-11-01 MED ORDER — ONDANSETRON HCL 4 MG/2ML IJ SOLN
INTRAMUSCULAR | Status: DC | PRN
  Administered 2011-11-01 (×2): 4 mg via INTRAVENOUS

## 2011-11-01 MED ORDER — NEOSTIGMINE METHYLSULFATE 1 MG/ML IJ SOLN
INTRAMUSCULAR | Status: DC | PRN
  Administered 2011-11-01: 4 mg via INTRAVENOUS

## 2011-11-01 MED ORDER — CEFAZOLIN SODIUM-DEXTROSE 2-3 GM-% IV SOLR
INTRAVENOUS | Status: DC | PRN
  Administered 2011-11-01: 2 g via INTRAVENOUS

## 2011-11-01 MED ORDER — HYDROMORPHONE HCL PF 1 MG/ML IJ SOLN
0.5000 mg | INTRAMUSCULAR | Status: DC | PRN
  Administered 2011-11-01 (×2): 0.5 mg via INTRAVENOUS
  Filled 2011-11-01 (×2): qty 1

## 2011-11-01 MED ORDER — THROMBIN 20000 UNITS EX KIT
PACK | CUTANEOUS | Status: AC
  Filled 2011-11-01: qty 1

## 2011-11-01 MED ORDER — VANCOMYCIN HCL 1000 MG IV SOLR
INTRAVENOUS | Status: DC | PRN
  Administered 2011-11-01 (×3): 1000 mg

## 2011-11-01 MED ORDER — PAROXETINE HCL ER 25 MG PO TB24
25.0000 mg | ORAL_TABLET | Freq: Every day | ORAL | Status: DC
Start: 2011-11-02 — End: 2011-11-03
  Administered 2011-11-02 – 2011-11-03 (×2): 25 mg via ORAL
  Filled 2011-11-01 (×2): qty 1

## 2011-11-01 MED ORDER — ACETAMINOPHEN 10 MG/ML IV SOLN
1000.0000 mg | Freq: Four times a day (QID) | INTRAVENOUS | Status: AC
  Administered 2011-11-01 – 2011-11-02 (×4): 1000 mg via INTRAVENOUS
  Filled 2011-11-01 (×6): qty 100

## 2011-11-01 MED ORDER — SENNOSIDES-DOCUSATE SODIUM 8.6-50 MG PO TABS: 1.0000 | ORAL_TABLET | Freq: Every evening | ORAL | Status: DC | PRN

## 2011-11-01 MED ORDER — SODIUM CHLORIDE 0.9 % IR SOLN
Status: DC | PRN
  Administered 2011-11-01 (×2): 3000 mL
  Administered 2011-11-01: 1000 mL

## 2011-11-01 MED ORDER — DOCUSATE SODIUM 100 MG PO CAPS
100.0000 mg | ORAL_CAPSULE | Freq: Two times a day (BID) | ORAL | Status: DC
  Administered 2011-11-01 – 2011-11-03 (×4): 100 mg via ORAL
  Filled 2011-11-01 (×2): qty 1
  Filled 2011-11-01: qty 4
  Filled 2011-11-01 (×3): qty 1

## 2011-11-01 MED ORDER — CEFAZOLIN SODIUM-DEXTROSE 2-3 GM-% IV SOLR
2.0000 g | Freq: Once | INTRAVENOUS | Status: DC
  Filled 2011-11-01: qty 50

## 2011-11-01 MED ORDER — LACTATED RINGERS IV SOLN
INTRAVENOUS | Status: DC | PRN
  Administered 2011-11-01 (×4): via INTRAVENOUS

## 2011-11-01 MED ORDER — ALUM & MAG HYDROXIDE-SIMETH 200-200-20 MG/5ML PO SUSP: 30.0000 mL | ORAL | Status: DC | PRN

## 2011-11-01 MED ORDER — BUPIVACAINE HCL (PF) 0.25 % IJ SOLN
INTRAMUSCULAR | Status: DC | PRN
  Administered 2011-11-01: 30 mL

## 2011-11-01 MED ORDER — LORATADINE 10 MG PO TABS
10.0000 mg | ORAL_TABLET | Freq: Every day | ORAL | Status: DC
  Administered 2011-11-03: 10 mg via ORAL
  Filled 2011-11-01 (×2): qty 1

## 2011-11-01 MED ORDER — VITAMIN D3 25 MCG (1000 UNIT) PO TABS
5000.0000 [IU] | ORAL_TABLET | Freq: Every day | ORAL | Status: DC
  Administered 2011-11-02 – 2011-11-03 (×2): 5000 [IU] via ORAL
  Filled 2011-11-01 (×3): qty 5

## 2011-11-01 MED ORDER — THROMBIN 20000 UNITS EX KIT
PACK | CUTANEOUS | Status: DC | PRN
  Administered 2011-11-01: 20000 [IU] via TOPICAL

## 2011-11-01 MED ORDER — FLEET ENEMA 7-19 GM/118ML RE ENEM: 1.0000 | ENEMA | Freq: Once | RECTAL | Status: AC | PRN

## 2011-11-01 MED ORDER — PROPOFOL 10 MG/ML IV BOLUS
INTRAVENOUS | Status: DC | PRN
  Administered 2011-11-01: 200 mg via INTRAVENOUS

## 2011-11-01 SURGICAL SUPPLY — 81 items
ADAPTER BOLT FEMORAL +2/-2 (Knees) ×2 IMPLANT
AUGMENT FEM SZ4 4 LT DIST PFC (Knees) ×4 IMPLANT
AUGMENT FMRL PST PFC SIG SZ4 8 (Orthopedic Implant) ×2 IMPLANT
BANDAGE ESMARK 6X9 LF (GAUZE/BANDAGES/DRESSINGS) ×2 IMPLANT
BLADE SAGITTAL 25.0X1.19X90 (BLADE) ×2 IMPLANT
BLADE SAW SGTL 13.0X1.19X90.0M (BLADE) ×2 IMPLANT
BLADE SAW SGTL 81X20 HD (BLADE) ×2 IMPLANT
BNDG ESMARK 6X9 LF (GAUZE/BANDAGES/DRESSINGS) ×4
BOWL SMART MIX CTS (DISPOSABLE) IMPLANT
CEMENT HV SMART SET (Cement) ×6 IMPLANT
CEMENT RESTRICTOR DEPUY SZ 3 (Cement) ×2 IMPLANT
CEMENT RESTRICTOR DEPUY SZ 4 (Cement) ×2 IMPLANT
CLOTH BEACON ORANGE TIMEOUT ST (SAFETY) ×2 IMPLANT
COMP FEM CEM LT SZ4 (Orthopedic Implant) ×2 IMPLANT
COMPONENT FEM CEM LT SZ4 (Orthopedic Implant) ×1 IMPLANT
CONT SPEC STER OR (MISCELLANEOUS) ×2 IMPLANT
COVER SURGICAL LIGHT HANDLE (MISCELLANEOUS) ×4 IMPLANT
CUFF TOURNIQUET SINGLE 34IN LL (TOURNIQUET CUFF) IMPLANT
CUFF TOURNIQUET SINGLE 44IN (TOURNIQUET CUFF) ×2 IMPLANT
DRAPE EXTREMITY T 121X128X90 (DRAPE) ×2 IMPLANT
DRAPE INCISE IOBAN 66X45 STRL (DRAPES) ×2 IMPLANT
DRAPE ORTHO SPLIT 77X108 STRL (DRAPES) ×2
DRAPE PROXIMA HALF (DRAPES) ×2 IMPLANT
DRAPE SURG ORHT 6 SPLT 77X108 (DRAPES) ×2 IMPLANT
DRSG ADAPTIC 3X8 NADH LF (GAUZE/BANDAGES/DRESSINGS) ×2 IMPLANT
DRSG PAD ABDOMINAL 8X10 ST (GAUZE/BANDAGES/DRESSINGS) ×4 IMPLANT
DURAPREP 26ML APPLICATOR (WOUND CARE) ×4 IMPLANT
ELECT REM PT RETURN 9FT ADLT (ELECTROSURGICAL) ×2
ELECTRODE REM PT RTRN 9FT ADLT (ELECTROSURGICAL) ×1 IMPLANT
EVACUATOR 1/8 PVC DRAIN (DRAIN) ×2 IMPLANT
FACESHIELD LNG OPTICON STERILE (SAFETY) ×4 IMPLANT
FEM POST AUG PFC SIGMA SZ4 8MM (Orthopedic Implant) ×4 IMPLANT
FEMORAL ADAPTER (Orthopedic Implant) ×2 IMPLANT
GLOVE BIO SURGEON STRL SZ7.5 (GLOVE) ×2 IMPLANT
GLOVE BIOGEL PI IND STRL 8 (GLOVE) ×3 IMPLANT
GLOVE BIOGEL PI IND STRL 8.5 (GLOVE) ×4 IMPLANT
GLOVE BIOGEL PI INDICATOR 8 (GLOVE) ×3
GLOVE BIOGEL PI INDICATOR 8.5 (GLOVE) ×4
GLOVE ECLIPSE 6.5 STRL STRAW (GLOVE) ×2 IMPLANT
GLOVE ECLIPSE 8.0 STRL XLNG CF (GLOVE) ×4 IMPLANT
GLOVE SURG ORTHO 8.0 STRL STRW (GLOVE) ×2 IMPLANT
GLOVE SURG ORTHO 8.5 STRL (GLOVE) ×6 IMPLANT
GLOVE SURG SS PI 7.5 STRL IVOR (GLOVE) ×4 IMPLANT
GOWN PREVENTION PLUS XLARGE (GOWN DISPOSABLE) ×2 IMPLANT
GOWN STRL NON-REIN LRG LVL3 (GOWN DISPOSABLE) ×6 IMPLANT
GOWN STRL REIN 3XL XLG LVL4 (GOWN DISPOSABLE) ×2 IMPLANT
HANDPIECE INTERPULSE COAX TIP (DISPOSABLE) ×2
INSERT TIBIAL TC3 SZ 4 12.5 (Knees) ×2 IMPLANT
KIT BASIN OR (CUSTOM PROCEDURE TRAY) ×2 IMPLANT
KIT ROOM TURNOVER OR (KITS) ×2 IMPLANT
MANIFOLD NEPTUNE II (INSTRUMENTS) ×2 IMPLANT
NEEDLE 22X1 1/2 (OR ONLY) (NEEDLE) IMPLANT
NS IRRIG 1000ML POUR BTL (IV SOLUTION) ×2 IMPLANT
PACK TOTAL JOINT (CUSTOM PROCEDURE TRAY) ×2 IMPLANT
PAD ARMBOARD 7.5X6 YLW CONV (MISCELLANEOUS) ×4 IMPLANT
PAD CAST 4YDX4 CTTN HI CHSV (CAST SUPPLIES) ×1 IMPLANT
PADDING CAST COTTON 4X4 STRL (CAST SUPPLIES) ×1
PADDING CAST COTTON 6X4 STRL (CAST SUPPLIES) ×2 IMPLANT
PENCIL BUTTON HOLSTER BLD 10FT (ELECTRODE) ×2 IMPLANT
SET HNDPC FAN SPRY TIP SCT (DISPOSABLE) ×2 IMPLANT
SPONGE GAUZE 4X4 12PLY (GAUZE/BANDAGES/DRESSINGS) ×2 IMPLANT
SPONGE LAP 18X18 X RAY DECT (DISPOSABLE) ×4 IMPLANT
STAPLER VISISTAT 35W (STAPLE) ×2 IMPLANT
STEM TIBIA PFC 13X30MM (Stem) ×4 IMPLANT
SUCTION FRAZIER TIP 10 FR DISP (SUCTIONS) ×2 IMPLANT
SUT BONE WAX W31G (SUTURE) ×2 IMPLANT
SUT ETHIBOND NAB CT1 #1 30IN (SUTURE) ×14 IMPLANT
SUT MNCRL AB 3-0 PS2 18 (SUTURE) ×2 IMPLANT
SUT VIC AB 0 CT1 27 (SUTURE) ×3
SUT VIC AB 0 CT1 27XBRD ANBCTR (SUTURE) ×3 IMPLANT
SUT VIC AB 2-0 FS1 27 (SUTURE) ×4 IMPLANT
SWAB COLLECTION DEVICE MRSA (MISCELLANEOUS) ×2 IMPLANT
SYR CONTROL 10ML LL (SYRINGE) IMPLANT
TOWER CARTRIDGE SMART MIX (DISPOSABLE) ×4 IMPLANT
TRAY FOLEY CATH 14FR (SET/KITS/TRAYS/PACK) ×2 IMPLANT
TRAY REVISION SZ 2.5 (Knees) ×2 IMPLANT
TRAY SLEEVE CEM ML (Knees) ×2 IMPLANT
TUBE ANAEROBIC SPECIMEN COL (MISCELLANEOUS) ×2 IMPLANT
TUBE CONNECTING 12X1/4 (SUCTIONS) ×2 IMPLANT
WATER STERILE IRR 1000ML POUR (IV SOLUTION) ×6 IMPLANT
YANKAUER SUCT BULB TIP NO VENT (SUCTIONS) ×2 IMPLANT

## 2011-11-01 NOTE — Transfer of Care (Signed)
Immediate Anesthesia Transfer of Care Note  Patient: [name]  Procedure(s) Performed: Procedure(s) (LRB) with comments: TOTAL KNEE REVISION (Left) - Revision Left Total Knee Replacement   Patient Location: PACU  Anesthesia Type:General and Regional  Level of Consciousness: awake, alert , oriented and sedated  Airway & Oxygen Therapy: Patient Spontanous Breathing and Patient connected to nasal cannula oxygen  Post-op Assessment: Report given to PACU RN, Post -op Vital signs reviewed and stable and Patient moving all extremities  Post vital signs: Reviewed and stable  Complications: No apparent anesthesia complications

## 2011-11-01 NOTE — Op Note (Signed)
PATIENT ID:      [name]  MRN:     [id number] DOB/AGE:    Dec 12, 1955 / 56 y.o.       OPERATIVE REPORT    DATE OF PROCEDURE:  11/01/2011       PREOPERATIVE DIAGNOSIS:   Loosened Left Total Knee Replacement                                                       BMI 45     POSTOPERATIVE DIAGNOSIS:   Loosened Left Total Knee Replacement                                                                     There is no height or weight on file to calculate BMI.                                                           BMI 45  PROCEDURE:  Procedure(s): TOTAL KNEE REVISION left     SURGEON:  [name], MD    ASSISTANT:   Jacqualine Code, PA-C   (Present and scrubbed throughout the case, critical for assistance with exposure, retraction, instrumentation, and closure.)          ANESTHESIA: regional and general     DRAINS: (left knee) Hemovact drain(s) in the open with  Suction Open :      TOURNIQUET TIME: 124 minutes first tourniquet, 30 minutes  second tourniquet with 20 minutes off tourniquet between   COMPLICATIONS:  None   CONDITION:  stable  PROCEDURE IN DETAIL: [id number]   [name] 11/01/2011, 11:48 AM

## 2011-11-01 NOTE — Progress Notes (Signed)
Patient ID: [name], female   DOB: 04-13-1955, 56 y.o.   MRN: [id number] The recent History & Physical has been reviewed. I have personally examined the patient today. There is no interval change to the documented History & Physical. The patient would like to proceed with the procedure.  [name] W 11/01/2011,  7:32 AM

## 2011-11-01 NOTE — Progress Notes (Signed)
Orthopedic Tech Progress Note Patient Details:  [name] April 18, 1955 [id number]  CPM Left Knee CPM Left Knee: On Left Knee Flexion (Degrees): 60  Left Knee Extension (Degrees): 0    [name] 11/01/2011, 1:04 PM

## 2011-11-01 NOTE — Preoperative (Signed)
Beta Blockers   Reason not to administer Beta Blockers:Hold  beta blocker due to Bradycardia (HR less than 50 bpm) 

## 2011-11-01 NOTE — Anesthesia Procedure Notes (Addendum)
Anesthesia Regional Block:    Pre-Anesthetic Checklist: ,, timeout performed,, Correct Site,, Correct Procedure,, site marked,,, surgical consent,,  Narrative:    Anesthesia Regional Block:  Femoral nerve block  Pre-Anesthetic Checklist: ,, timeout performed, Correct Patient, Correct Site, Correct Laterality, Correct Procedure, Correct Position, site marked, Risks and benefits discussed,  Surgical consent,  Pre-op evaluation,  At surgeon's request and post-op pain management  Laterality: Left  Prep: chloraprep       Needles:   Needle Type: Echogenic Needle          Additional Needles: Femoral nerve block  Nerve Stimulator or Paresthesia:  Response: 0.5 mA,   Additional Responses:   Narrative:  Start time: 11/01/2011 7:15 AM End time: 11/01/2011 7:30 AM Injection made incrementally with aspirations every 5 mL.  Performed by: Personally  Anesthesiologist: Dr. Randa Evens  Femoral nerve block

## 2011-11-01 NOTE — Progress Notes (Signed)
UR COMPLETED  

## 2011-11-01 NOTE — Anesthesia Preprocedure Evaluation (Addendum)
Anesthesia Evaluation  Patient identified by MRN, date of birth, ID band Patient awake    Reviewed: Allergy & Precautions, H&P , NPO status , Patient's Chart, lab work & pertinent test results  History of Anesthesia Complications (+) PONV  Airway Mallampati: II      Dental   Pulmonary neg pulmonary ROS,  breath sounds clear to auscultation        Cardiovascular Rhythm:Regular Rate:Normal     Neuro/Psych Anxiety    GI/Hepatic Neg liver ROS, hiatal hernia, GERD-  ,  Endo/Other  Hypothyroidism Morbid obesity  Renal/GU negative Renal ROS     Musculoskeletal   Abdominal   Peds  Hematology   Anesthesia Other Findings   Reproductive/Obstetrics                         Anesthesia Physical Anesthesia Plan  ASA: III  Anesthesia Plan: General   Post-op Pain Management:    Induction: Intravenous  Airway Management Planned: Oral ETT  Additional Equipment:   Intra-op Plan:   Post-operative Plan: Extubation in OR  Informed Consent: I have reviewed the patients History and Physical, chart, labs and discussed the procedure including the risks, benefits and alternatives for the proposed anesthesia with the patient or authorized representative who has indicated his/her understanding and acceptance.     Plan Discussed with: CRNA, Anesthesiologist and Surgeon  Anesthesia Plan Comments:         Anesthesia Quick Evaluation

## 2011-11-01 NOTE — Anesthesia Postprocedure Evaluation (Signed)
  Anesthesia Post-op Note  Patient: [name]  Procedure(s) Performed: Procedure(s) (LRB) with comments: TOTAL KNEE REVISION (Left) - Revision Left Total Knee Replacement   Patient Location: PACU  Anesthesia Type:General  Level of Consciousness: awake  Airway and Oxygen Therapy: Patient Spontanous Breathing  Post-op Pain: mild  Post-op Assessment: Post-op Vital signs reviewed  Post-op Vital Signs: Reviewed  Complications: No apparent anesthesia complications

## 2011-11-02 LAB — BASIC METABOLIC PANEL
BUN: 11 mg/dL (ref 6–23)
CO2: 28 mEq/L (ref 19–32)
Calcium: 8.6 mg/dL (ref 8.4–10.5)
Chloride: 102 mEq/L (ref 96–112)
Creatinine, Ser: 1.01 mg/dL (ref 0.50–1.10)
GFR calc Af Amer: 71 mL/min — ABNORMAL LOW (ref >90)
GFR calc non Af Amer: 61 mL/min — ABNORMAL LOW (ref >90)
Glucose, Bld: 119 mg/dL — ABNORMAL HIGH (ref 70–99)
Potassium: 3.5 mEq/L (ref 3.5–5.1)
Sodium: 138 mEq/L (ref 135–145)

## 2011-11-02 LAB — CBC
HCT: 29.9 % — ABNORMAL LOW (ref 36.0–46.0)
Hemoglobin: 10 g/dL — ABNORMAL LOW (ref 12.0–15.0)
MCH: 31.8 pg (ref 26.0–34.0)
MCHC: 33.4 g/dL (ref 30.0–36.0)
MCV: 95.2 fL (ref 78.0–100.0)
Platelets: 218 10*3/uL (ref 150–400)
RBC: 3.14 MIL/uL — ABNORMAL LOW (ref 3.87–5.11)
RDW: 13 % (ref 11.5–15.5)
WBC: 9.9 10*3/uL (ref 4.0–10.5)

## 2011-11-02 LAB — URINE CULTURE
Colony Count: NO GROWTH
Culture: NO GROWTH

## 2011-11-02 MED ORDER — POTASSIUM CHLORIDE CRYS ER 20 MEQ PO TBCR
30.0000 meq | EXTENDED_RELEASE_TABLET | Freq: Once | ORAL | Status: AC
  Administered 2011-11-02: 30 meq via ORAL
  Filled 2011-11-02: qty 1

## 2011-11-02 MED ORDER — SODIUM CHLORIDE 0.9 % IV SOLN
INTRAVENOUS | Status: DC
  Administered 2011-11-02: 18:00:00 via INTRAVENOUS

## 2011-11-02 MED ORDER — HYDROMORPHONE HCL 2 MG PO TABS
4.0000 mg | ORAL_TABLET | ORAL | Status: DC | PRN
  Administered 2011-11-02 – 2011-11-03 (×7): 4 mg via ORAL
  Filled 2011-11-02 (×7): qty 2

## 2011-11-02 MED ORDER — ACETAMINOPHEN 10 MG/ML IV SOLN
1000.0000 mg | Freq: Four times a day (QID) | INTRAVENOUS | Status: DC
  Administered 2011-11-02 – 2011-11-03 (×3): 1000 mg via INTRAVENOUS
  Filled 2011-11-02 (×4): qty 100

## 2011-11-02 MED ORDER — CEFAZOLIN SODIUM-DEXTROSE 2-3 GM-% IV SOLR
2.0000 g | Freq: Four times a day (QID) | INTRAVENOUS | Status: AC
  Administered 2011-11-02 (×2): 2 g via INTRAVENOUS
  Filled 2011-11-02 (×2): qty 50

## 2011-11-02 NOTE — Evaluation (Signed)
Occupational Therapy Evaluation Patient Details Name: [name] MRN: [id number] DOB: 07-18-1955 Today's Date: 11/02/2011 Time: 1027-2536 OT Time Calculation (min): 53 min  OT Assessment / Plan / Recommendation Clinical Impression  Pt. 56 yo female s/p left total knee revision. Pt. is doing well with mobility for ADL's. OT to follow acutely. Plan to address tub bench transfer and LB dressing next session.     OT Assessment  Patient needs continued OT Services    Follow Up Recommendations  Supervision - Intermittent    Barriers to Discharge      Equipment Recommendations  Rolling walker with 5" wheels    Recommendations for Other Services    Frequency  Min 2X/week    Precautions / Restrictions Precautions Precautions: Knee Restrictions Weight Bearing Restrictions: Yes LLE Weight Bearing: Partial weight bearing LLE Partial Weight Bearing Percentage or Pounds: 50   Pertinent Vitals/Pain Pain level 10/10, RN Ivar Drape made aware will receive pain meds at 9 am. BP at beginning of session supine in bed 90/63. At end of session BP 104/56 seated in chair.     ADL  Grooming: Performed;Wash/dry hands;Wash/dry face;Teeth care;Min guard Where Assessed - Grooming: Supported standing Lower Body Bathing: Simulated;Minimal assistance Where Assessed - Lower Body Bathing: Supported sit to stand Lower Body Dressing: Performed;Minimal assistance Where Assessed - Lower Body Dressing: Supported sit to Pharmacist, hospital: Performed;Min Pension scheme manager Method: Sit to Barista: Raised toilet seat with arms (or 3-in-1 over toilet) Toileting - Clothing Manipulation and Hygiene: Performed;Supervision/safety Where Assessed - Toileting Clothing Manipulation and Hygiene: Sit to stand from 3-in-1 or toilet Equipment Used: Gait belt;Rolling walker Transfers/Ambulation Related to ADLs: min (A) for sit<>stand and min guard for stand <>sit. Ambulated to bathroom with min  guard  ADL Comments: Pt. completed bed mobility with min (A) to support LLE due to pain. Ambulated to bathroom with min guard. Performed toilet transfer with min guard and cues for sequencing. Able to wash hands and brush teeth at sink level with min guard for safety. Educated on proper LB dressing techniques and required min (A) to dress LLE.     OT Diagnosis: Generalized weakness;Acute pain  OT Problem List: Decreased activity tolerance;Decreased safety awareness;Decreased knowledge of use of DME or AE;Pain OT Treatment Interventions: Self-care/ADL training;Therapeutic exercise;DME and/or AE instruction;Therapeutic activities;Patient/family education   OT Goals Acute Rehab OT Goals OT Goal Formulation: With patient Time For Goal Achievement: 11/16/11 Potential to Achieve Goals: Good ADL Goals Pt Will Perform Lower Body Dressing: with supervision;Sit to stand from chair ADL Goal: Lower Body Dressing - Progress: Goal set today Pt Will Perform Tub/Shower Transfer: Tub transfer;Transfer tub bench;with supervision ADL Goal: Tub/Shower Transfer - Progress: Goal set today Miscellaneous OT Goals Miscellaneous OT Goal #1: Pt wil be mod independence level for bed mobility as precursor to ADL's. OT Goal: Miscellaneous Goal #1 - Progress: Goal set today  Visit Information  Last OT Received On: 11/02/11 Assistance Needed: +1 PT/OT Co-Evaluation/Treatment: Yes    Subjective Data  Subjective: I haven't been up yet to walk Patient Stated Goal: To get pain under control and go home.    Prior Functioning     Home Living Lives With: Spouse Available Help at Discharge: Family Type of Home: House Home Access: Level entry Home Layout: One level Bathroom Shower/Tub: Engineer, manufacturing systems: Handicapped height Bathroom Accessibility: Yes How Accessible: Accessible via walker Home Adaptive Equipment: Shower chair with back;Walker - standard;Straight cane Prior Function Level of  Independence: Independent with  assistive device(s) (cane) Able to Take Stairs?: No Driving: Yes Vocation: On disability Communication Communication: No difficulties Dominant Hand: Right         Vision/Perception     Cognition  Overall Cognitive Status: Appears within functional limits for tasks assessed/performed Arousal/Alertness: Awake/alert Orientation Level: Appears intact for tasks assessed Behavior During Session: [facility] for tasks performed    Extremity/Trunk Assessment Right Upper Extremity Assessment RUE ROM/Strength/Tone: [facility] for tasks assessed Left Upper Extremity Assessment LUE ROM/Strength/Tone: [facility] for tasks assessed Right Lower Extremity Assessment RLE ROM/Strength/Tone: Unable to fully assess;Deficits;Due to pain;Due to precautions Left Lower Extremity Assessment LLE ROM/Strength/Tone: [facility] for tasks assessed     Mobility Bed Mobility Bed Mobility: Supine to Sit;Sitting - Scoot to Edge of Bed Supine to Sit: 4: Min assist Sitting - Scoot to [employer] of Bed: 4: Min assist Details for Bed Mobility Assistance: Vc's for technique and Min Assist for LLE Transfers Transfers: Sit to Stand;Stand to Sit Sit to Stand: 4: Min assist;4: Min guard;From bed;With upper extremity assist;From chair/3-in-1;With armrests Stand to Sit: 4: Min guard;To chair/3-in-1;With armrests Details for Transfer Assistance: Min assist to stand; VC's for hand placement               Balance Balance Balance Assessed: Yes High Level Balance High Level Balance Activites: Side stepping;Backward walking;Direction changes;Turns High Level Balance Comments: steady with increased time   End of Session OT - End of Session Equipment Utilized During Treatment: Gait belt Activity Tolerance: Patient tolerated treatment well Patient left: in chair;with call bell/phone within reach;with family/visitor present Nurse Communication: Mobility status CPM Left Knee CPM Left Knee: Off  GO     [name] 11/02/2011, 9:16 AM

## 2011-11-02 NOTE — Evaluation (Signed)
I agree with the following treatment note after reviewing documentation.   Johnston, Delesa Kawa Brynn   OTR/L Pager: [phone] Office: [phone] .   

## 2011-11-02 NOTE — Op Note (Signed)
NAMEELISHEVA, [name]               ACCOUNT NO.:  [id number]  MEDICAL RECORD NO.:  [id number]  LOCATION:  5N22C                        FACILITY:  [facility]  PHYSICIAN:  Claude Manges. Undra Trembath, M.D.DATE OF BIRTH:  August 20, 1955  DATE OF PROCEDURE:  11/01/2011 DATE OF DISCHARGE:                              OPERATIVE REPORT   PREOPERATIVE DIAGNOSIS:  Loose and painful left total knee replacement.  POSTOPERATIVE DIAGNOSIS:  Loose and painful left total knee replacement.  PROCEDURE:  Revision, left total knee replacement.  SURGEON:  Claude Manges. Cleophas Dunker, M.D.  ASSISTANT:  Arlys John D. Petrarca, PA-C, was present throughout the operative procedure to ensure its timely completion.  COMPLICATIONS:  None.  ANESTHESIA:  General with supplemental femoral nerve block.  COMPLICATIONS:  None.  COMPONENTS:  DePuy revision TC3 components, a #4 tibial component with two 8 mm posterior augments and two 4 mm anterior augments, this is a 13 mm x 30 mm stem, a femoral adapter bolt.  A #2.5 tibial tray with a 13 x 30 mm stem.  I used cement restrictor #3 in the tibia and #4 in the femur and a 12.5 mm polyethylene bridging bearing, components were cemented.  The patient has had a prior patellectomy.  PROCEDURE:  [name] was met in the holding area, identified the left knee as the appropriate operative site.  She did receive a preoperative femoral nerve block by Anesthesia in the holding area.  The patient was then transported to room #7, placed under general anesthesia without difficulty.  The nursing staff inserted a Foley catheter.  Urine was clear.  Left leg was then placed in a thigh tourniquet.  The leg was then prepped with Betadine scrub and then DuraPrep from the tourniquet to the midfoot.  Sterile draping was performed.  With the extremity elevated, it was Esmarch exsanguinated with a proximal tourniquet at 350 mmHg.  The prior longitudinal incision was utilized centered about the mid knee.   The patient had a prior patellectomy over 10 years ago and was functioning well without a patella.  Via sharp dissection, incision was carried down through subcutaneous tissue.  There was abundant adipose tissue consistent with the patient's body habitus.  The deep capsule was identified by virtue of the previously inserted Ethibond sutures.  The deep capsule was then incised along the suture line and the old sutures were removed.  The joint was then entered, there was a clear yellow joint effusion.  Specimens were sent for anaerobic and aerobic cultures, were to be held for 2 weeks. The patient had a preoperative CBC and sed rate, which were both within normal limits and cultures had been negative.  She also had a bone scan consistent with loosening of both the femur, the femoral and tibial components.  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  The synovium did not appear to be infected.  There was no gross loosening of the femoral component or the tibial component, but we proceeded to remove all of the previously inserted components and obtained deep synovial specimens for white cells per high- powered field.  Using the oscillating saw and the  thin osteotome, the femoral component was removed with very little bone attached to the component.  I obtained several specimens of deep synovium and then sent them to the pathologist, but they called and related that there was no evidence of acute inflammation and the same with the tibia with the same results.  We removed the femur and any remaining cement.  The polyethylene component stem was then amputated with a osteotome and then I removed the metallic femoral tibial component with the same oscillating saw and osteotome.  The cement was removed from the tibial canal.  We then proceeded with a revision total knee arthroplasty.  The tibial canal was  then hand reamed to 15 mm, and I checked to be sure that we had plenty of bone and then we measured a 2.5 mm tibial tray, and that was the appropriate fit.  I then applied the external tibial guide.  I then applied the tibial cutting jig over the hand reamer, which had been previously inserted into the tibial canal and did a cleanup cut on the proximal tibia.  I then applied a #2.5 tibial tray with a center stem in place, I then reamed and had an excellent fit without any toggling or need for augments.  This was left in place and the femur was then addressed.  Using #4 tibial jig, we made anterior, posterior, and distal femoral cuts and then inserted the trial femoral component with two 8 mm posterior augments, two 4 mm anterior augments and felt we had an excellent position and instability using the 13 mm x 30 mm stem.  The tibial component was then inserted followed by the femoral trial component, and then we measured a 12.5 mm polyethylene component and then we had full extension.  There was no opening with varus or valgus stress and negative anterior drawer sign.  The trial components were then removed.  The tourniquet was deflated at 2 hours and 4 minutes.  We left the tourniquet down for 20 minutes as we irrigated the joint and then re-esmarched the leg, elevated the tourniquet to 350 mmHg and proceeded with insertion of the final components.  We initially inserted the #2.5 tibial tray with a 13 mm x 30 mm stem.  I used a #3 cement plug and then inserted the entire component with excellent position.  Extraneous methacrylate was removed from the periphery.  We then inserted the femoral component with two 8 mm posterior augments and two 4 mm anterior augments, very nice position.  We used #4 cement restrictor with methacrylate.  Extraneous methacrylate was removed from its periphery.  We then inserted the 12.5 mm polyethylene component and the knee was placed in full  extension.  MCL and LCL remained intact.  At approximately 15 minutes, the methacrylate had matured.  I did use 1 g of vancomycin in each of the 3.  The joint was then explored.  There was some extraneous methacrylate posteriorly, which was removed with an osteotome.  We injected 0.25% Marcaine with epinephrine in the deep capsule.  I then exchanged the trial polyethylene component for the final 12.5 mm polyethylene component.  After insertion, put the knee through a full range of motion and there was perfect stability, no opening with varus or valgus stress. Tourniquet was then deflated at 30 minutes.  We did spray the operative site with thrombin, inserted a medium-size Hemovac, we had very nice hemostasis.  The deep capsule was closed with interrupted #1 Ethibond, superficial capsule with 0-Vicryl,  then 2-0 Vicryl, 3-0 Monocryl.  The skin was closed with skin clips.  Sterile bulky dressing was applied followed by the patient's support stocking.  The patient tolerated the procedure well without complications. Technically the procedure somewhat difficult based on the patient's size.     Claude Manges. Cleophas Dunker, M.D.     PWW/MEDQ  D:  11/01/2011  T:  11/02/2011  Job:  [id number]

## 2011-11-02 NOTE — Progress Notes (Signed)
Patient ID: [name], female   DOB: 27-Jul-1955, 56 y.o.   MRN: [id number] PATIENT ID: [name]        MRN:  [id number]          DOB/AGE: [birth year] / 56 y.o.    [name], MD   Jacqualine Code, PA-C [address] [place], [place]  [address]                             [phone]   PROGRESS NOTE  Subjective:  negative for Chest Pain  negative for Shortness of Breath  negative for Nausea/Vomiting   negative for Calf Pain    Tolerating Diet: yes         Patient reports pain as moderate.     Pain an issue and could be prolonged given chronic LBP and chronic pain meds, but so far tolerating left knee pain with present meds  Objective: Vital signs in last 24 hours:   Patient Vitals for the past 24 hrs:  BP Temp Temp src Pulse Resp SpO2  11/02/11 0617 94/38 mmHg 98.8 F (37.1 C) Oral 63  18  94 %  11/02/11 0423 99/38 mmHg - - 83  - -  11/02/11 0044 100/43 mmHg - - - - -  11/01/11 2202 94/44 mmHg 99.7 F (37.6 C) Oral 85  18  98 %  11/01/11 1600 - - - - 16  99 %  11/01/11 1534 104/39 mmHg - - 76  16  97 %  11/01/11 1520 106/41 mmHg - - 74  17  98 %  11/01/11 1519 - - - 75  18  98 %  11/01/11 1518 - - - 74  17  97 %  11/01/11 1517 - - - 73  18  97 %  11/01/11 1516 - - - 75  15  97 %  11/01/11 1515 - 97.7 F (36.5 C) - 75  19  95 %  11/01/11 1514 - - - 73  18  98 %  11/01/11 1513 - - - 74  17  96 %  11/01/11 1512 - - - 73  19  97 %  11/01/11 1511 - - - 79  16  98 %  11/01/11 1510 - - - 76  15  97 %  11/01/11 1509 - - - 75  14  99 %  11/01/11 1508 - - - 78  17  98 %  11/01/11 1507 - - - 75  16  97 %  11/01/11 1506 - - - 74  18  96 %  11/01/11 1505 122/62 mmHg - - 75  17  97 %  11/01/11 1504 - - - 75  17  98 %  11/01/11 1503 - - - 75  16  97 %  11/01/11 1502 - - - 74  18  97 %  11/01/11 1501 - - - 73  17  99 %  11/01/11 1500 - - - 75  14  97 %  11/01/11 1459 - - - 75  14  96 %  11/01/11 1458 - - - 75  20  95 %  11/01/11 1457 - - - 73  17  96 %    11/01/11 1456 - - - 74  15  98 %  11/01/11 1455 - - - 73  17  96 %  11/01/11 1454 - - - 73  16  95 %  11/01/11 1453 - - - 72  15  97 %  11/01/11 1452 - - - 74  14  96 %  11/01/11 1451 - - - 73  18  95 %  11/01/11 1450 119/66 mmHg - - 73  16  96 %  11/01/11 1449 - - - 73  19  95 %  11/01/11 1448 - - - 74  23  96 %  11/01/11 1447 - - - 74  22  96 %  11/01/11 1446 - - - 75  17  97 %  11/01/11 1445 - - - 74  18  95 %  11/01/11 1444 - - - 73  18  94 %  11/01/11 1443 - - - 71  19  94 %  11/01/11 1442 - - - 74  16  94 %  11/01/11 1441 - - - 73  12  97 %  11/01/11 1440 - - - 74  18  94 %  11/01/11 1439 - - - 73  18  95 %  11/01/11 1438 - - - 74  19  97 %  11/01/11 1437 - - - 73  15  97 %  11/01/11 1436 - - - 77  17  97 %  11/01/11 1435 118/54 mmHg - - 76  15  96 %  11/01/11 1434 - - - 75  21  95 %  11/01/11 1433 - - - 74  17  97 %  11/01/11 1432 - - - 74  19  96 %  11/01/11 1431 - - - 73  18  97 %  11/01/11 1430 - - - 73  21  98 %  11/01/11 1429 - - - 74  18  99 %  11/01/11 1428 - - - 74  16  98 %  11/01/11 1427 - - - 74  17  97 %  11/01/11 1426 - - - 74  18  98 %  11/01/11 1425 - - - 73  20  97 %  11/01/11 1424 - - - 72  20  96 %  11/01/11 1423 - - - 73  20  97 %  11/01/11 1422 - - - 74  18  96 %  11/01/11 1421 - - - 72  17  96 %  11/01/11 1420 118/58 mmHg - - 72  16  95 %  11/01/11 1419 - - - 74  17  98 %  11/01/11 1418 - - - 75  18  96 %  11/01/11 1417 - - - 74  20  96 %  11/01/11 1416 - - - 74  18  96 %  11/01/11 1415 - - - 73  16  96 %  11/01/11 1414 - - - 74  20  96 %  11/01/11 1413 - - - 73  16  95 %  11/01/11 1412 - - - 73  18  96 %  11/01/11 1411 - - - 72  22  96 %  11/01/11 1410 - - - 73  18  94 %  11/01/11 1409 - - - 72  16  95 %  11/01/11 1408 - - - 74  15  99 %  11/01/11 1407 - - - 72  18  95 %  11/01/11 1406 - - - 72  16  96 %  11/01/11 1405 153/131 mmHg - - 74  18  96 %  11/01/11 1404 - - - 73  21  96 %  11/01/11 1403 - - - 72  18  96 %  11/01/11 1402 -  - - 71  15  96 %  11/01/11 1401 - - - 71  18  98 %  11/01/11 1400 - - - 72  19  96 %  11/01/11 1359 - - - 73  17  95 %  11/01/11 1358 - - - 72  17  95 %  11/01/11 1357 - - - 71  16  98 %  11/01/11 1356 - - - 72  14  96 %  11/01/11 1355 - - - 72  15  96 %  11/01/11 1354 - - - 72  18  95 %  11/01/11 1353 - - - 71  19  95 %  11/01/11 1352 - - - 71  15  97 %  11/01/11 1351 - - - 73  16  95 %  11/01/11 1350 98/46 mmHg - - 71  18  94 %  11/01/11 1349 - - - 72  17  94 %  11/01/11 1348 - - - 72  15  96 %  11/01/11 1347 - - - 72  17  94 %  11/01/11 1346 - - - 72  18  97 %  11/01/11 1345 - - - 72  17  95 %  11/01/11 1344 - - - 74  16  97 %  11/01/11 1343 - - - 76  22  98 %  11/01/11 1342 - - - 74  30  96 %  11/01/11 1341 - - - 72  18  94 %  11/01/11 1340 - - - 73  18  93 %  11/01/11 1339 - - - 72  17  95 %  11/01/11 1338 - - - 73  19  93 %  11/01/11 1337 - - - 74  18  96 %  11/01/11 1336 - - - 74  18  97 %  11/01/11 1335 105/53 mmHg - - 73  17  96 %  11/01/11 1334 - - - 73  19  97 %  11/01/11 1333 - - - 75  18  96 %  11/01/11 1332 - - - 73  18  97 %  11/01/11 1331 - - - 74  18  98 %  11/01/11 1330 - 97.8 F (36.6 C) - 73  15  97 %  11/01/11 1329 - - - 73  19  98 %  11/01/11 1328 - - - 71  16  93 %  11/01/11 1327 - - - 71  17  95 %  11/01/11 1326 - - - 72  18  93 %  11/01/11 1325 - - - 71  19  96 %  11/01/11 1324 - - - 71  17  96 %  11/01/11 1323 - - - 72  18  93 %  11/01/11 1322 - - - 72  18  95 %  11/01/11 1321 - - - 72  16  96 %  11/01/11 1320 134/40 mmHg - - 70  19  93 %  11/01/11 1319 - - - 73  18  94 %  11/01/11 1318 - - - 72  17  92 %  11/01/11 1317 - - - 73  15  96 %  11/01/11 1316 - - - 72  16  97 %  11/01/11 1315 - - - 74  20  99 %  11/01/11 1314 - - -  74  18  97 %  11/01/11 1313 - - - 73  20  97 %  11/01/11 1312 - - - 74  18  97 %  11/01/11 1311 - - - 74  16  97 %  11/01/11 1310 - - - 74  17  96 %  11/01/11 1309 - - - 74  15  94 %  11/01/11 1308 - - - 73  18  94  %  11/01/11 1307 - - - 72  16  96 %  11/01/11 1306 - - - 73  18  96 %  11/01/11 1305 139/47 mmHg - - 72  17  93 %  11/01/11 1304 - - - 74  18  95 %  11/01/11 1303 - - - 75  18  95 %  11/01/11 1302 - - - 73  20  96 %  11/01/11 1301 - - - 72  18  96 %  11/01/11 1300 - - - 73  18  97 %  11/01/11 1259 - - - 73  16  97 %  11/01/11 1258 - - - 73  17  96 %  11/01/11 1257 - - - 73  19  97 %  11/01/11 1256 - - - 76  18  96 %  11/01/11 1255 - - - 76  17  97 %  11/01/11 1254 - - - 76  17  95 %  11/01/11 1253 - - - 77  20  96 %  11/01/11 1252 - - - 77  24  96 %  11/01/11 1251 - - - 75  16  96 %  11/01/11 1250 131/63 mmHg - - 75  18  97 %  11/01/11 1249 - - - 77  16  99 %  11/01/11 1248 - - - 74  17  95 %  11/01/11 1247 - - - 74  17  97 %  11/01/11 1246 - - - 75  17  93 %  11/01/11 1245 131/63 mmHg - - 76  15  92 %  11/01/11 1244 - - - 77  18  96 %  11/01/11 1243 - - - 77  16  98 %  11/01/11 1242 - - - 77  16  99 %  11/01/11 1241 - - - 77  18  99 %  11/01/11 1240 - - - 77  17  98 %  11/01/11 1239 - - - 75  17  98 %  11/01/11 1238 - - - 75  15  98 %  11/01/11 1237 - - - 76  13  98 %  11/01/11 1236 - - - 75  14  98 %  11/01/11 1235 132/50 mmHg - - 77  14  99 %  11/01/11 1234 - - - 79  14  99 %  11/01/11 1233 - - - 80  16  96 %  11/01/11 1232 - - - 81  19  97 %  11/01/11 1231 - - - 83  17  98 %  11/01/11 1230 132/50 mmHg - - 84  20  100 %  11/01/11 1229 - - - 84  16  96 %  11/01/11 1228 - - - 85  18  96 %  11/01/11 1227 - - - 86  17  96 %  11/01/11 1226 - - - 86  15  97 %  11/01/11 1225 - - - 85  18  99 %  11/01/11 1224 - - - 84  18  100 %  11/01/11 1223 - - - 85  20  98 %  11/01/11 1222 - - - 84  18  98 %  11/01/11 1221 - - - 86  19  100 %  11/01/11 1220 138/65 mmHg - - 88  - 96 %  11/01/11 1215 138/65 mmHg 98.1 F (36.7 C) - 85  18  97 %      Intake/Output from previous day:   10/29 0701 - 10/30 0700 In: 3320 [P.O.:120; I.V.:3200] Out: 5115 [Urine:4050; Drains:365]    Intake/Output this shift:       Intake/Output      10/29 0701 - 10/30 0700 10/30 0701 - 10/31 0700   P.O. 120    I.V. 3200    Total Intake 3320    Urine 4050    Drains 365    Blood 700    Total Output 5115    Net -1795            LABORATORY DATA:  Basename 11/02/11 0450 11/01/11 0607  WBC 9.9 5.8  HGB 10.0* 12.5  HCT 29.9* 37.7  PLT 218 257    Basename 11/02/11 0450  NA 138  K 3.5  CL 102  CO2 28  BUN 11  CREATININE 1.01  GLUCOSE 119*  CALCIUM 8.6   Lab Results  Component Value Date   INR 1.06 11/01/2011    Recent Radiographic Studies :  Dg Chest 2 View  10/20/2011  *RADIOLOGY REPORT*  Clinical Data: Preoperative evaluation prior joint replacement.  CHEST - 2 VIEW  Comparison: Chest x-ray 05/26/2010.  Findings: Lung volumes are normal.  No consolidative airspace disease.  No pleural effusions.  No pneumothorax.  No pulmonary nodule or mass noted.  Pulmonary vasculature and the cardiomediastinal silhouette are within normal limits.  IMPRESSION: 1. No radiographic evidence of acute cardiopulmonary disease.   Original Report Authenticated By: Florencia Reasons, M.D.      Examination:  General appearance: alert and mild distress  Wound Exam: clean, dry, intact   Drainage:  None: wound tissue dry  Motor Exam: EHL, FHL, Anterior Tibial and Posterior Tibial Intact  Sensory Exam: Superficial Peroneal, Deep Peroneal and Tibial diminished  Vascular Exam: Normal  Assessment:    1 Day Post-Op  Procedure(s) (LRB): TOTAL KNEE REVISION (Left)  ADDITIONAL DIAGNOSIS:  Active Problems:  * No active hospital problems. *   hypotension   Plan: Physical Therapy as ordered Partial Weight Bearing @ 50% (PWB)  DVT Prophylaxis:  Xarelto  DISCHARGE PLAN: Home  DISCHARGE NEEDS: has equipment at home   fluid challenge for decreased BP, monitor pain meds, hemovac with 150cc in last shift-keep in place until am, tingling in left toes related to tourniquet and  should resolve in next 245-48 hrs, OOB with PT    WHITFIELD, PETER W 11/02/2011 8:11 AM

## 2011-11-02 NOTE — Progress Notes (Signed)
Physical Therapy Treatment Patient Details Name: [name] MRN: [id number] DOB: 1955-08-03 Today's Date: 11/02/2011 Time: 7829-5621 PT Time Calculation (min): 24 min  PT Assessment / Plan / Recommendation Comments on Treatment Session  Pt is making steady progress towards therapy goals, anticipate safe for d/c following tomorrow session    Follow Up Recommendations  Home health PT     Does the patient have the potential to tolerate intense rehabilitation     Barriers to Discharge        Equipment Recommendations  Rolling walker with 5" wheels    Recommendations for Other Services    Frequency 7X/week   Plan Discharge plan remains appropriate    Precautions / Restrictions Precautions Precautions: Knee Restrictions Weight Bearing Restrictions: Yes LLE Weight Bearing: Partial weight bearing LLE Partial Weight Bearing Percentage or Pounds: 50   Pertinent Vitals/Pain 4/10    Mobility  Bed Mobility Bed Mobility: Sit to Supine;Scooting to HOB Sit to Supine: 4: Min assist;HOB flat Scooting to HOB: 5: Supervision;With trapeze Details for Bed Mobility Assistance: Vc's for technique and Min Assist for LLE Transfers Transfers: Sit to Stand;Stand to Sit Sit to Stand: 5: Supervision;From chair/3-in-1;With armrests;With upper extremity assist Stand to Sit: 5: Supervision;To bed Details for Transfer Assistance: VC's for hand placement Ambulation/Gait Ambulation/Gait Assistance: 5: Supervision Ambulation Distance (Feet): 140 Feet Assistive device: Rolling walker Ambulation/Gait Assistance Details: Emergining step thru gait; steady with one rest break  Gait Pattern: Step-to pattern;Decreased stride length;Antalgic;Trunk flexed;Step-through pattern Gait velocity: decreased General Gait Details: Pt able to increase ambulation distance  Stairs: No     PT Goals Acute Rehab PT Goals PT Goal: Stand - Progress: Progressing toward goal PT Goal: Ambulate - Progress: Progressing  toward goal  Visit Information  Last PT Received On: 11/02/11 Assistance Needed: +1    Subjective Data  Subjective: I'm feeling less pain I think the pain medicine is working now Patient Stated Goal: to go home    Cognition  Overall Cognitive Status: Appears within functional limits for tasks assessed/performed Arousal/Alertness: Awake/alert Orientation Level: Appears intact for tasks assessed Behavior During Session: [facility] for tasks performed    Balance  Balance Balance Assessed: Yes High Level Balance High Level Balance Activites: Side stepping;Backward walking;Direction changes;Turns High Level Balance Comments: steady with increased time  End of Session PT - End of Session Equipment Utilized During Treatment: Gait belt Activity Tolerance: Patient tolerated treatment well;Patient limited by fatigue;Patient limited by pain Patient left: in bed;in CPM;with call bell/phone within reach Nurse Communication: Mobility status;Other (comment) (CPM set at 55 degrees)   GP     Fabio Asa 11/02/2011, 4:56 PM  Charlotte Crumb, PT DPT  [phone]

## 2011-11-02 NOTE — Progress Notes (Signed)
Orthopedic Tech Progress Note Patient Details:  [name] May 15, 1955 [id number]  Patient ID: [name], female   DOB: Aug 10, 1955, 56 y.o.   MRN: [id number]   [name] 11/02/2011, 9:40 AM TRAPEZE Eston Esters

## 2011-11-02 NOTE — Evaluation (Signed)
Physical Therapy Evaluation Patient Details Name: ADELYNE MARCHESE MRN: [id number] DOB: 03/01/1955 Today's Date: 11/02/2011 Time: 2130-8657 PT Time Calculation (min): 25 min  PT Assessment / Plan / Recommendation Clinical Impression  Pt is a pleasent and cooperative 56 y.o. female s/p L TKA revision.  Pt tolerated therapy well despite low BP.  Vitals assessed throughout session.90/63 pre activity, 118/52 seated on commode during activity; 104/56 post activity.  Pt presents with deficits in functional mobility secondary to pian, weakness, decreased ROM, and decreased activity tolerance. Will continue to see to address deficits and maximize independence for dc.    PT Assessment  Patient needs continued PT services    Follow Up Recommendations  Home health PT    Does the patient have the potential to tolerate intense rehabilitation      Barriers to Discharge        Equipment Recommendations  Rolling walker with 5" wheels    Recommendations for Other Services     Frequency 7X/week    Precautions / Restrictions Precautions Precautions: Knee Restrictions Weight Bearing Restrictions: Yes LLE Weight Bearing: Partial weight bearing LLE Partial Weight Bearing Percentage or Pounds: 50   Pertinent Vitals/Pain 10/10      Mobility  Bed Mobility Bed Mobility: Supine to Sit;Sitting - Scoot to Edge of Bed Supine to Sit: 4: Min assist Sitting - Scoot to [place] of Bed: 4: Min assist Details for Bed Mobility Assistance: Vc's for technique and Min Assist for LLE Transfers Transfers: Sit to Stand;Stand to Sit Sit to Stand: 4: Min assist;4: Min guard;From bed;With upper extremity assist;From chair/3-in-1;With armrests Stand to Sit: 4: Min guard;To chair/3-in-1;With armrests Details for Transfer Assistance: Min assist to stand; VC's for hand placement Ambulation/Gait Ambulation/Gait Assistance: 4: Min guard (due to low BP and mild dizziness) Ambulation Distance (Feet): 26 Feet Assistive  device: Rolling walker Ambulation/Gait Assistance Details: VC's for sequencing and upright posture Gait Pattern: Step-to pattern;Decreased stride length;Antalgic;Trunk flexed Gait velocity: decreased General Gait Details: Pt limited with mild dizziness and + diaphoresis Stairs: No           PT Diagnosis: Difficulty walking;Abnormality of gait;Generalized weakness;Acute pain  PT Problem List: Decreased strength;Decreased range of motion;Decreased activity tolerance;Decreased balance;Decreased mobility;Pain PT Treatment Interventions: DME instruction;Gait training;Functional mobility training;Stair training;Therapeutic exercise;Therapeutic activities;Patient/family education   PT Goals Acute Rehab PT Goals PT Goal Formulation: With patient Time For Goal Achievement: 11/07/11 Potential to Achieve Goals: Good Pt will go Supine/Side to Sit: with modified independence PT Goal: Supine/Side to Sit - Progress: Goal set today Pt will Stand: with modified independence PT Goal: Stand - Progress: Goal set today Pt will Ambulate: >150 feet;with modified independence;with rolling walker PT Goal: Ambulate - Progress: Goal set today Pt will Go Up / Down Stairs: 1-2 stairs;with modified independence;with rolling walker PT Goal: Up/Down Stairs - Progress: Goal set today Pt will Perform Home Exercise Program: Independently PT Goal: Perform Home Exercise Program - Progress: Goal set today  Visit Information  Last PT Received On: 11/02/11 Assistance Needed: +1    Subjective Data  Subjective: to go to the bathroom Patient Stated Goal: to go home   Prior Functioning  Home Living Lives With: Spouse Available Help at Discharge: Family Type of Home: House Home Access: Level entry Home Layout: One level Bathroom Shower/Tub: Engineer, manufacturing systems: Handicapped height Bathroom Accessibility: Yes How Accessible: Accessible via walker Home Adaptive Equipment: Shower chair with back;Walker -  standard;Straight cane Prior Function Level of Independence: Independent with assistive device(s) (cane) Able  to Take Stairs?: No Driving: Yes Vocation: On disability Communication Communication: No difficulties Dominant Hand: Right    Cognition  Overall Cognitive Status: Appears within functional limits for tasks assessed/performed Arousal/Alertness: Awake/alert Orientation Level: Appears intact for tasks assessed Behavior During Session: [facility] for tasks performed    Extremity/Trunk Assessment Right Upper Extremity Assessment RUE ROM/Strength/Tone: [facility] for tasks assessed Left Upper Extremity Assessment LUE ROM/Strength/Tone: [facility] for tasks assessed Right Lower Extremity Assessment RLE ROM/Strength/Tone: Unable to fully assess;Deficits;Due to pain;Due to precautions Left Lower Extremity Assessment LLE ROM/Strength/Tone: [facility] for tasks assessed   Balance Balance Balance Assessed: Yes High Level Balance High Level Balance Activites: Side stepping;Backward walking;Direction changes;Turns High Level Balance Comments: steady with increased time  End of Session PT - End of Session Equipment Utilized During Treatment: Gait belt Activity Tolerance: Patient tolerated treatment well;Patient limited by fatigue;Patient limited by pain Patient left: in chair;with call bell/phone within reach;Other (comment) (with OT) Nurse Communication: Mobility status;Other (comment) (pt urinated 350 ccs following foley removal) CPM Left Knee CPM Left Knee: Off  GP     Fabio Asa 11/02/2011, 9:16 AM  Charlotte Crumb, PT DPT  [phone]

## 2011-11-03 ENCOUNTER — Encounter (HOSPITAL_COMMUNITY): Payer: Self-pay | Admitting: Orthopaedic Surgery

## 2011-11-03 DIAGNOSIS — G8929 Other chronic pain: Secondary | ICD-10-CM

## 2011-11-03 DIAGNOSIS — M25562 Pain in left knee: Secondary | ICD-10-CM

## 2011-11-03 DIAGNOSIS — E039 Hypothyroidism, unspecified: Secondary | ICD-10-CM | POA: Diagnosis present

## 2011-11-03 LAB — BASIC METABOLIC PANEL
BUN: 8 mg/dL (ref 6–23)
CO2: 28 mEq/L (ref 19–32)
Calcium: 8.4 mg/dL (ref 8.4–10.5)
Chloride: 107 mEq/L (ref 96–112)
Creatinine, Ser: 0.9 mg/dL (ref 0.50–1.10)
GFR calc Af Amer: 81 mL/min — ABNORMAL LOW (ref >90)
GFR calc non Af Amer: 70 mL/min — ABNORMAL LOW (ref >90)
Glucose, Bld: 131 mg/dL — ABNORMAL HIGH (ref 70–99)
Potassium: 4.2 mEq/L (ref 3.5–5.1)
Sodium: 140 mEq/L (ref 135–145)

## 2011-11-03 LAB — CBC
HCT: 28.6 % — ABNORMAL LOW (ref 36.0–46.0)
Hemoglobin: 9.3 g/dL — ABNORMAL LOW (ref 12.0–15.0)
MCH: 31.1 pg (ref 26.0–34.0)
MCHC: 32.5 g/dL (ref 30.0–36.0)
MCV: 95.7 fL (ref 78.0–100.0)
Platelets: 196 10*3/uL (ref 150–400)
RBC: 2.99 MIL/uL — ABNORMAL LOW (ref 3.87–5.11)
RDW: 13.3 % (ref 11.5–15.5)
WBC: 9 10*3/uL (ref 4.0–10.5)

## 2011-11-03 MED ORDER — HYDROMORPHONE HCL 4 MG PO TABS: 4.0000 mg | ORAL_TABLET | ORAL | Status: DC | PRN

## 2011-11-03 MED ORDER — RIVAROXABAN 10 MG PO TABS: 10.0000 mg | ORAL_TABLET | Freq: Every day | ORAL | Status: DC

## 2011-11-03 NOTE — Progress Notes (Signed)
Physical Therapy Treatment Patient Details Name: [name] MRN: [id number] DOB: May 09, 1955 Today's Date: 11/03/2011 Time: 4098-1191 PT Time Calculation (min): 35 min  PT Assessment / Plan / Recommendation Comments on Treatment Session  Pt has contiinued to make steady progress towards PT goal and demonstrates safe mobility at this time. Pt has performed step negotiation safely and tolerates activity without complication.     Follow Up Recommendations  Home health PT     Does the patient have the potential to tolerate intense rehabilitation     Barriers to Discharge        Equipment Recommendations  Rolling walker with 5" wheels    Recommendations for Other Services    Frequency 7X/week   Plan Discharge plan remains appropriate    Precautions / Restrictions Precautions Precautions: Knee Restrictions Weight Bearing Restrictions: Yes LLE Weight Bearing: Partial weight bearing LLE Partial Weight Bearing Percentage or Pounds: 50   Pertinent Vitals/Pain 4/10    Mobility  Bed Mobility Bed Mobility: Supine to Sit;Sitting - Scoot to Edge of Bed Supine to Sit: 6: Modified independent (Device/Increase time) Sitting - Scoot to Edge of Bed: 6: Modified independent (Device/Increase time) Details for Bed Mobility Assistance: Able to perform with increased time.  Transfers Transfers: Sit to Stand;Stand to Sit Sit to Stand: 5: Supervision;From chair/3-in-1;With armrests;With upper extremity assist;From bed Stand to Sit: 5: Supervision;With upper extremity assist;With armrests;To chair/3-in-1 Details for Transfer Assistance: VC's for hand placement Ambulation/Gait Ambulation/Gait Assistance: 5: Supervision Ambulation Distance (Feet): 100 Feet Assistive device: Rolling walker Ambulation/Gait Assistance Details: VC's to look up Gait Pattern: Step-through pattern;Decreased stride length;Antalgic;Trunk flexed Gait velocity: decreased General Gait Details: Pt tolerated ambulation  well  Stairs: Yes Stairs Assistance: 5: Supervision Stairs Assistance Details (indicate cue type and reason): VC's and demonstration for technique initially Stair Management Technique: No rails;Forwards;Step to pattern;With walker Number of Stairs: 2       PT Goals Acute Rehab PT Goals PT Goal: Supine/Side to Sit - Progress: Progressing toward goal PT Goal: Stand - Progress: Progressing toward goal PT Goal: Ambulate - Progress: Progressing toward goal PT Goal: Up/Down Stairs - Progress: Met PT Goal: Perform Home Exercise Program - Progress: Progressing toward goal  Visit Information  Last PT Received On: 11/03/11 Assistance Needed: +1    Subjective Data  Subjective: I am ready to go home Patient Stated Goal: to go home    Cognition  Overall Cognitive Status: Appears within functional limits for tasks assessed/performed Arousal/Alertness: Awake/alert Orientation Level: Appears intact for tasks assessed Behavior During Session: [facility] for tasks performed    Balance  Balance Balance Assessed: Yes High Level Balance High Level Balance Activites: Side stepping;Backward walking;Direction changes;Turns;Sudden stops;Head turns High Level Balance Comments: steady with increased time  End of Session PT - End of Session Equipment Utilized During Treatment: Gait belt Activity Tolerance: Patient tolerated treatment well;Patient limited by fatigue;Patient limited by pain Patient left: in chair;with call bell/phone within reach;with family/visitor present;Other (comment) (PA in room to remove drain) Nurse Communication: Mobility status (safe for d/c) CPM Left Knee CPM Left Knee: On Left Knee Flexion (Degrees): 60  Left Knee Extension (Degrees): 0    GP     [name] 11/03/2011, 11:55 AM Charlotte Crumb, PT DPT  [phone]

## 2011-11-03 NOTE — Discharge Summary (Signed)
Home Kristin Campbell, MD   Jacqualine Code, PA-C [address], [place], [place]  [address]                             [phone]  PATIENT ID: [name]        MRN:  [id number]          DOB/AGE: Oct 07, 1955 / 56 y.o.    DISCHARGE SUMMARY  ADMISSION DATE:    11/01/2011 DISCHARGE DATE:   11/03/2011   ADMISSION DIAGNOSIS: Loosened Left Total Knee Replacement    DISCHARGE DIAGNOSIS:  Loosened Left Total Knee Replacement    ADDITIONAL DIAGNOSIS: Principal Problem:  *Painful total knee replacement Active Problems:  Obesity, Class III, BMI 40-49.9 (morbid obesity)  Hypothyroid  Past Medical History  Diagnosis Date  . PONV (postoperative nausea and vomiting)   . Dysrhythmia     rapid HR on occasion-takes Propranolol daily  . Hyperlipidemia     takes LIpitor daily  . Peripheral neuropathy   . Joint pain   . Joint swelling   . Chronic back pain     facet disease and bulding disc  . Rosacea   . GERD (gastroesophageal reflux disease)     takes Prilosec and Zegerid daily  . H/O hiatal hernia   . Constipation     r/t pain meds;takes stool softener daily  . History of bladder infections     > 12yr ago  . Anxiety     takes Paxil daily and Xanax prn  . Insomnia   . Hypothyroidism     takes Synthroid daily  . Muscle spasms of head or neck     lumbar and thoracic;takes Robaxin prn  . Seasonal allergies     takes allegra prn  . Kidney stones   . Anginal pain     "on and off" (11/01/2011)  . Iron deficiency anemia 2007    "S/P knee replacement"  . Arthritis     "knees" (11/01/2011)    PROCEDURE: Procedure(s): TOTAL KNEE REVISION Left on 11/01/2011  CONSULTS: none     HISTORY: [name] is a very pleasant 56 year old white female who returns today for followup of her left total knee arthroplasty. On 08/02/2004, she had a left total knee replacement by Dr. Priscille Kluver. She had seen him several times in regard to pain in the left knee; however, over the past several years  it has been worsening. In 2011 there was a bone scan that showed consistent with synovitis of the knee without loosening of the components. There was a normal sed rate and CRP. She did not have any history of injury or trauma, but at that time she was taking care of her mother and was doing a lot of extra work. She had developed pain along the medial tibial area mainly. She, however, then saw [place], and we chose to obtain a bone scan as well as aspiration of the joint with routine laboratory studies. She has noted that the bone scan was consistent with loosening of the tibial and femoral components of the total knee prosthesis. The laboratory studies showed a sed rate of 16 and a C-reactive protein of less than 0.5. She had a normal white count and hemoglobin. Aspiration of the knee revealed 2615 white cells, 40 neutrophils, 33 monocytes, 20 lymphs, and 7 eos. Glucose was 53. Protein was less than 3. Cultures were negative. Bone density was then ordered, and that has showed to be  essentially normal with a T score at minus 0.1. She continues to have pain and discomfort in the knee   HOSPITAL COURSE:  [name] is a 56 y.o. admitted on 11/01/2011 and found to have a diagnosis of Loosened Left Total Knee Replacement.  After appropriate laboratory studies were obtained  they were taken to the operating room on 11/01/2011 and underwent  Procedure(s): TOTAL KNEE REVISION Left.   They were given perioperative antibiotics:  Anti-infectives     Start     Dose/Rate Route Frequency Ordered Stop   November 03, 2011 0830   ceFAZolin (ANCEF) IVPB 2 g/50 mL premix     Comments: REVISION TOTAL KNEE REPLACEMENT AND WILL CONTINUE IV ANTIBX FOR TOTAL OF 48 HOURS.      2 g 100 mL/hr over 30 Minutes Intravenous Every 6 hours 11/03/2011 0800 2011/11/03 1616   11/01/11 1600   ceFAZolin (ANCEF) IVPB 2 g/50 mL premix        2 g 100 mL/hr over 30 Minutes Intravenous Every 6 hours 11/01/11 1537 11/01/11 2128   11/01/11 1053    vancomycin (VANCOCIN) powder  Status:  Discontinued          As needed 11/01/11 1054 11/01/11 1210   11/01/11 0730   ceFAZolin (ANCEF) IVPB 2 g/50 mL premix  Status:  Discontinued        2 g 100 mL/hr over 30 Minutes Intravenous  Once 11/01/11 0726 11/01/11 1537        .  Tolerated the procedure well.  Placed with a foley intraoperatively.  Given Ofirmev at induction and for 48 hours.    POD #1, allowed out of bed to a chair.  PT for ambulation and exercise program.  Foley D/C'd in morning.  IV saline was continued at 12ml/hr for 6 hours then kvo'd because of hypotension.  O2 discontionued.  POD #2, continued PT and ambulation.  Hemovac pulled. Dressing changed. . The remainder of the hospital course was dedicated to ambulation and strengthening.   The patient was discharged on 2 Days Post-Op in  Stable condition.  Blood products given:none  DIAGNOSTIC STUDIES: Recent vital signs:  Patient Vitals for the past 24 hrs:  BP Temp Pulse Resp SpO2  11/03/11 1007 95/44 mmHg - 95  - -  11/03/11 0800 - - - 15  99 %  11/03/11 0704 120/68 mmHg 97.6 F (36.4 C) 90  18  99 %  11/03/11 2244 112/43 mmHg - 99  - -  2011-11-03 2146 139/44 mmHg 99.8 F (37.7 C) 99  18  95 %  Nov 03, 2011 1546 99/44 mmHg - 85  - -  Nov 03, 2011 1448 106/34 mmHg 99.5 F (37.5 C) 92  18  95 %       Recent laboratory studies:  Basename 11/03/11 0530 Nov 03, 2011 0450 11/01/11 0607  WBC 9.0 9.9 5.8  HGB 9.3* 10.0* 12.5  HCT 28.6* 29.9* 37.7  PLT 196 218 257    Basename 11/03/11 0530 2011-11-03 0450  NA 140 138  K 4.2 3.5  CL 107 102  CO2 28 28  BUN 8 11  CREATININE 0.90 1.01  GLUCOSE 131* 119*  CALCIUM 8.4 8.6   Lab Results  Component Value Date   INR 1.06 11/01/2011     Recent Radiographic Studies :  Dg Chest 2 View  10/20/2011  *RADIOLOGY REPORT*  Clinical Data: Preoperative evaluation prior joint replacement.  CHEST - 2 VIEW  Comparison: Chest x-ray 05/26/2010.  Findings: Lung volumes are normal.  No  consolidative airspace disease.  No pleural effusions.  No pneumothorax.  No pulmonary nodule or mass noted.  Pulmonary vasculature and the cardiomediastinal silhouette are within normal limits.  IMPRESSION: 1. No radiographic evidence of acute cardiopulmonary disease.   Original Report Authenticated By: Florencia Reasons, M.D.     DISCHARGE INSTRUCTIONS:     Discharge Orders    Future Orders Please Complete By Expires   Diet general      Call MD / Call 911      Comments:   If you experience chest pain or shortness of breath, CALL 911 and be transported to the hospital emergency room.  If you develope a fever above 101 F, pus (white drainage) or increased drainage or redness at the wound, or calf pain, call your surgeon's office.   Constipation Prevention      Comments:   Drink plenty of fluids.  Prune juice may be helpful.  You may use a stool softener, such as Colace (over the counter) 100 mg twice a day.  Use MiraLax (over the counter) for constipation as needed.   Increase activity slowly as tolerated      Patient may shower      Comments:   May shower over the brown band aid if present on leg.  You may shower without a dressing once there is no drainage.  Do not wash over the wound.  If drainage remains, cover wound with plastic wrap and then shower.   Partial weight bearing      Comments:   50 % WEIGHT BEARING AS TAUGHT IN PHYSICAL THERAPY   Lifting restrictions      Comments:   No lifting for 6 weeks   Discharge instructions      Comments:   Dr Tiburcio Pea will be calling in Inderal 20 mg tablets for you to take as prescribed.  Do not take then with your Inderal LA.  Contact him if any concerns with blood pressure or pulse   CPM      Comments:   Continuous passive motion machine (CPM):      Use the CPM from 0 to 60 for 6-8 hours per day.      You may increase by 5 per day.  You may break it up into 2 or 3 sessions per day.      Use CPM for 3-4 weeks or until you are told to stop.     TED hose      Comments:   Use stockings (TED hose) for 3 weeks on operative leg(s).  You may remove them at night for sleeping.   Change dressing      Comments:   Change dressing on Sunday, then change the dressing daily with sterile 4 x 4 inch gauze dressing and apply TED hose.  You may clean the incision with alcohol prior to redressing.   Do not put a pillow under the knee. Place it under the heel.         DISCHARGE MEDICATIONS:     Medication List     As of 11/03/2011 12:02 PM    STOP taking these medications         aspirin EC 81 MG tablet      omeprazole 20 MG capsule   Commonly known as: PRILOSEC      propranolol ER 80 MG 24 hr capsule   Commonly known as: INDERAL LA      TAKE these medications  ALPRAZolam 0.5 MG tablet   Commonly known as: XANAX   Take 0.5 mg by mouth at bedtime as needed.      atorvastatin 10 MG tablet   Commonly known as: LIPITOR   Take 10 mg by mouth Daily.      docusate sodium 100 MG capsule   Commonly known as: COLACE   Take 100 mg by mouth 2 (two) times daily.      fexofenadine 180 MG tablet   Commonly known as: ALLEGRA   Take 180 mg by mouth daily as needed. For allergies      HYDROmorphone 4 MG tablet   Commonly known as: DILAUDID   Take 1 tablet (4 mg total) by mouth every 3 (three) hours as needed.      methocarbamol 500 MG tablet   Commonly known as: ROBAXIN   Take 500 mg by mouth 3 (three) times daily as needed. For muscle spasms      Omeprazole-Sodium Bicarbonate 20-1100 MG Caps   Commonly known as: ZEGERID   Take 1 capsule by mouth daily before breakfast.      PARoxetine 25 MG 24 hr tablet   Commonly known as: PAXIL-CR   Take 25 mg by mouth Twice daily.      rivaroxaban 10 MG Tabs tablet   Commonly known as: XARELTO   Take 1 tablet (10 mg total) by mouth daily.      SYNTHROID 150 MCG tablet   Generic drug: levothyroxine   Take 150 mg by mouth Daily.      Vitamin D-3 5000 UNITS Tabs   Take 5,000  Units by mouth daily.         FOLLOW UP VISIT:   Follow-up Information    Follow up with [facility], [facility]. Schedule an appointment as soon as possible for a visit on 11/16/2011.   Contact information:   [address] [place] [address] [phone]          DISPOSITION:  Home  CONDITION:  Stable  PETRARCA,BRIAN 11/03/2011, 12:02 PM

## 2011-11-03 NOTE — Progress Notes (Signed)
I agree with the following treatment note after reviewing documentation.   Johnston, Karissa Meenan Brynn   OTR/L Pager: [phone] Office: [phone] .   

## 2011-11-03 NOTE — Progress Notes (Signed)
Patient ID: [name], female   DOB: [birth year], 56 y.o.   MRN: [id number] PATIENT ID: [name]        MRN:  [id number]          DOB/AGE: 11-03-1955 / 56 y.o.    Kristin Campbell, MD   Kristin Code, PA-C [address] [place], [place]  [address]                             [phone]   PROGRESS NOTE  Subjective:  negative for Chest Pain  negative for Shortness of Breath  negative for Nausea/Vomiting   negative for Calf Pain    Tolerating Diet: yes         Patient reports pain as mild.  Controlled on 4mg  Dilaudid q 3-4 hours prn   Desire to be discharged  Objective: Vital signs in last 24 hours:   Patient Vitals for the past 24 hrs:  BP Temp Pulse Resp SpO2  11/03/11 1007 95/44 mmHg - 95  - -  11/03/11 0800 - - - 15  99 %  11/03/11 0704 120/68 mmHg 97.6 F (36.4 C) 90  18  99 %  11/02/11 2244 112/43 mmHg - 99  - -  11/02/11 2146 139/44 mmHg 99.8 F (37.7 C) 99  18  95 %  11/02/11 1546 99/44 mmHg - 85  - -  11/02/11 1448 106/34 mmHg 99.5 F (37.5 C) 92  18  95 %      Intake/Output from previous day:   10/30 0701 - 10/31 0700 In: 480 [P.O.:480] Out: 1525 [Urine:1300; Drains:225]   Intake/Output this shift:       Intake/Output      10/30 0701 - 10/31 0700 10/31 0701 - 11/01 0700   P.O. 480    I.V.     Total Intake 480    Urine 1300    Drains 225    Blood     Total Output 1525    Net -1045            LABORATORY DATA:  Basename 11/03/11 0530 11/02/11 0450 11/01/11 0607  WBC 9.0 9.9 5.8  HGB 9.3* 10.0* 12.5  HCT 28.6* 29.9* 37.7  PLT 196 218 257    Basename 11/03/11 0530 11/02/11 0450  NA 140 138  K 4.2 3.5  CL 107 102  CO2 28 28  BUN 8 11  CREATININE 0.90 1.01  GLUCOSE 131* 119*  CALCIUM 8.4 8.6   Lab Results  Component Value Date   INR 1.06 11/01/2011    Recent Radiographic Studies :  Dg Chest 2 View  10/20/2011  *RADIOLOGY REPORT*  Clinical Data: Preoperative evaluation prior joint replacement.  CHEST - 2 VIEW   Comparison: Chest x-ray 05/26/2010.  Findings: Lung volumes are normal.  No consolidative airspace disease.  No pleural effusions.  No pneumothorax.  No pulmonary nodule or mass noted.  Pulmonary vasculature and the cardiomediastinal silhouette are within normal limits.  IMPRESSION: 1. No radiographic evidence of acute cardiopulmonary disease.   Original Report Authenticated By: Florencia Reasons, M.D.      Examination:  General appearance: alert, mild distress and morbidly obese Resp: clear to auscultation bilaterally Cardio: regular rate and rhythm, S1, S2 normal, no murmur, click, rub or gallop and regular rate and rhythm GI: normal findings: bowel sounds normal  Wound Exam: clean, dry, intact   Drainage:  None: wound tissue dry  Motor  Exam: EHL, FHL, Anterior Tibial and Posterior Tibial Intact  Sensory Exam: Superficial Peroneal, Deep Peroneal and Tibial diminished  Vascular Exam: Left posterior tibial artery has 1+ (weak) pulse  Assessment:    2 Days Post-Op  Procedure(s) (LRB): TOTAL KNEE REVISION (Left)  ADDITIONAL DIAGNOSIS:  Active Problems:  * No active hospital problems. *   Acute Blood Loss Anemia   Plan: Physical Therapy as ordered Partial Weight Bearing @ 50% (PWB)  DVT Prophylaxis:  Xarelto, Foot Pumps and TED hose  DISCHARGE PLAN: Home today.  Cleared by PT this AM  DISCHARGE NEEDS: HHPT, CPM, Walker and 3-in-1 comode seat         Rich,Kristin 11/03/2011 11:30 AM

## 2011-11-03 NOTE — Progress Notes (Signed)
Occupational Therapy Treatment Patient Details Name: [name] MRN: [id number] DOB: 1955/11/13 Today's Date: 11/03/2011 Time: 4098-1191 OT Time Calculation (min): 35 min  OT Assessment / Plan / Recommendation Comments on Treatment Session Pt is progressing well, performs LB ADL's with min (A) from husband. Ambulates and transfers with supervision for safety.     Follow Up Recommendations  Supervision - Intermittent    Barriers to Discharge       Equipment Recommendations  Rolling walker with 5" wheels    Recommendations for Other Services    Frequency Min 2X/week   Plan Discharge plan remains appropriate    Precautions / Restrictions Precautions Precautions: Knee Restrictions Weight Bearing Restrictions: Yes LLE Weight Bearing: Partial weight bearing LLE Partial Weight Bearing Percentage or Pounds: 50   Pertinent Vitals/Pain None reported     ADL  Lower Body Dressing: Performed;Minimal assistance Where Assessed - Lower Body Dressing: Supported sit to stand Toilet Transfer: Supervision/safety;Simulated Statistician Method: Sit to Barista: Raised toilet seat with arms (or 3-in-1 over toilet) Tub/Shower Transfer: Engineer, manufacturing Method: Science writer: Counsellor Used: Gait belt;Rolling walker Transfers/Ambulation Related to ADLs: supervision level for safety. vc's for correct hand placement ADL Comments: Performed bed mobility at modified independence and ambulates at supervision level. Pt already dressed prior to OT entering room, states she requires (A) with getting pants over her feet but then is able to finish pulling them up. Her husband is available to (A). Educated and demonstrated use of tub bench to transfer into shower. Pt showed return demonstration at supervision level with increased time and use of sheet to (A) with lifting RLE over tub.      OT  Diagnosis:    OT Problem List:   OT Treatment Interventions:     OT Goals Acute Rehab OT Goals OT Goal Formulation: With patient Time For Goal Achievement: 11/16/11 Potential to Achieve Goals: Good ADL Goals Pt Will Perform Lower Body Dressing: with supervision;Sit to stand from chair ADL Goal: Lower Body Dressing - Progress: Progressing toward goals Pt Will Perform Tub/Shower Transfer: Tub transfer;Transfer tub bench;with supervision ADL Goal: Tub/Shower Transfer - Progress: Met Miscellaneous OT Goals Miscellaneous OT Goal #1: Pt wil be mod independence level for bed mobility as precursor to ADL's. OT Goal: Miscellaneous Goal #1 - Progress: Met  Visit Information  Last OT Received On: 11/03/11 Assistance Needed: +1 PT/OT Co-Evaluation/Treatment: Yes    Subjective Data      Prior Functioning       Cognition  Overall Cognitive Status: Appears within functional limits for tasks assessed/performed Arousal/Alertness: Awake/alert Orientation Level: Appears intact for tasks assessed Behavior During Session: [facility] for tasks performed    Mobility  Shoulder Instructions Bed Mobility Bed Mobility: Supine to Sit;Sitting - Scoot to Edge of Bed Supine to Sit: 6: Modified independent (Device/Increase time) Sitting - Scoot to Edge of Bed: 6: Modified independent (Device/Increase time) Details for Bed Mobility Assistance: Able to perform with increased time.  Transfers Transfers: Sit to Stand;Stand to Sit Sit to Stand: 5: Supervision;From chair/3-in-1;With armrests;With upper extremity assist;From bed Stand to Sit: 5: Supervision;With upper extremity assist;With armrests;To chair/3-in-1 Details for Transfer Assistance: VC's for hand placement                 End of Session OT - End of Session Equipment Utilized During Treatment: Gait belt Activity Tolerance: Patient tolerated treatment well Patient left: in chair;with call bell/phone within reach;with family/visitor  present Nurse  Communication: Mobility status CPM Left Knee CPM Left Knee: On Left Knee Flexion (Degrees): 60  Left Knee Extension (Degrees): 0   GO     [name] 11/03/2011, 11:22 AM

## 2011-11-15 LAB — BODY FLUID CULTURE: Culture: NO GROWTH

## 2011-11-15 LAB — ANAEROBIC CULTURE

## 2012-01-12 ENCOUNTER — Inpatient Hospital Stay
Admission: RE | Admit: 2012-01-12 | Discharge: 2012-01-12 | Disposition: A | Discharge status: Home / Self Care | Payer: Self-pay | Source: Ambulatory Visit | Attending: Orthopaedic Surgery | Admitting: Orthopaedic Surgery

## 2012-01-12 ENCOUNTER — Other Ambulatory Visit (HOSPITAL_COMMUNITY): Payer: Self-pay | Admitting: Orthopaedic Surgery

## 2012-01-12 DIAGNOSIS — R52 Pain, unspecified: Secondary | ICD-10-CM

## 2012-01-30 ENCOUNTER — Other Ambulatory Visit: Payer: Self-pay | Admitting: Family Medicine

## 2012-01-30 DIAGNOSIS — Z1231 Encounter for screening mammogram for malignant neoplasm of breast: Secondary | ICD-10-CM

## 2012-02-23 ENCOUNTER — Ambulatory Visit: Payer: Medicare Other

## 2012-03-12 ENCOUNTER — Ambulatory Visit: Payer: Medicare Other

## 2012-04-17 ENCOUNTER — Ambulatory Visit: Payer: Medicare Other

## 2012-05-09 ENCOUNTER — Ambulatory Visit: Payer: Medicare Other

## 2012-05-15 ENCOUNTER — Ambulatory Visit
Admission: RE | Admit: 2012-05-15 | Discharge: 2012-05-15 | Disposition: A | Discharge status: Home / Self Care | Payer: Medicare Other | Source: Ambulatory Visit | Attending: Family Medicine | Admitting: Family Medicine

## 2012-05-15 DIAGNOSIS — Z1231 Encounter for screening mammogram for malignant neoplasm of breast: Secondary | ICD-10-CM

## 2012-08-20 ENCOUNTER — Ambulatory Visit (HOSPITAL_COMMUNITY)
Admission: RE | Admit: 2012-08-20 | Discharge: 2012-08-20 | Disposition: A | Discharge status: Home / Self Care | Payer: Medicare Other | Source: Ambulatory Visit | Attending: Anesthesiology | Admitting: Anesthesiology

## 2012-08-20 ENCOUNTER — Other Ambulatory Visit (HOSPITAL_COMMUNITY): Payer: Self-pay | Admitting: Anesthesiology

## 2012-08-20 DIAGNOSIS — M25559 Pain in unspecified hip: Secondary | ICD-10-CM | POA: Insufficient documentation

## 2012-08-20 DIAGNOSIS — M25551 Pain in right hip: Secondary | ICD-10-CM

## 2013-01-24 ENCOUNTER — Other Ambulatory Visit: Payer: Self-pay | Admitting: Orthopaedic Surgery

## 2013-01-24 DIAGNOSIS — M25551 Pain in right hip: Secondary | ICD-10-CM

## 2013-02-06 ENCOUNTER — Other Ambulatory Visit: Payer: Medicare Other

## 2013-03-08 ENCOUNTER — Ambulatory Visit
Admission: RE | Admit: 2013-03-08 | Discharge: 2013-03-08 | Disposition: A | Discharge status: Home / Self Care | Payer: 59 | Source: Ambulatory Visit | Attending: Orthopaedic Surgery | Admitting: Orthopaedic Surgery

## 2013-03-08 DIAGNOSIS — M25551 Pain in right hip: Secondary | ICD-10-CM

## 2013-03-08 MED ORDER — IOHEXOL 180 MG/ML  SOLN
15.0000 mL | Freq: Once | INTRAMUSCULAR | Status: AC | PRN
  Administered 2013-03-08: 15 mL via INTRA_ARTICULAR

## 2013-03-27 ENCOUNTER — Other Ambulatory Visit: Payer: Self-pay | Admitting: Orthopaedic Surgery

## 2013-03-27 DIAGNOSIS — M545 Low back pain, unspecified: Secondary | ICD-10-CM

## 2013-04-05 ENCOUNTER — Ambulatory Visit
Admission: RE | Admit: 2013-04-05 | Discharge: 2013-04-05 | Disposition: A | Discharge status: Home / Self Care | Payer: 59 | Source: Ambulatory Visit | Attending: Orthopaedic Surgery | Admitting: Orthopaedic Surgery

## 2013-04-05 DIAGNOSIS — M545 Low back pain, unspecified: Secondary | ICD-10-CM

## 2013-06-21 ENCOUNTER — Ambulatory Visit
Admission: RE | Admit: 2013-06-21 | Discharge: 2013-06-21 | Disposition: A | Discharge status: Home / Self Care | Payer: Medicare Other | Source: Ambulatory Visit | Attending: Family Medicine | Admitting: Family Medicine

## 2013-06-21 ENCOUNTER — Other Ambulatory Visit: Payer: Self-pay | Admitting: Family Medicine

## 2013-06-21 DIAGNOSIS — R1084 Generalized abdominal pain: Secondary | ICD-10-CM

## 2013-06-24 ENCOUNTER — Other Ambulatory Visit: Payer: Self-pay

## 2013-06-24 DIAGNOSIS — Z1231 Encounter for screening mammogram for malignant neoplasm of breast: Secondary | ICD-10-CM

## 2013-07-08 ENCOUNTER — Ambulatory Visit: Payer: Medicare Other

## 2013-07-15 ENCOUNTER — Ambulatory Visit
Admission: RE | Admit: 2013-07-15 | Discharge: 2013-07-15 | Disposition: A | Discharge status: Home / Self Care | Payer: Medicare Other | Source: Ambulatory Visit

## 2013-07-15 DIAGNOSIS — Z1231 Encounter for screening mammogram for malignant neoplasm of breast: Secondary | ICD-10-CM

## 2013-08-05 ENCOUNTER — Telehealth: Payer: Self-pay | Admitting: Interventional Cardiology

## 2013-08-05 ENCOUNTER — Ambulatory Visit (INDEPENDENT_AMBULATORY_CARE_PROVIDER_SITE_OTHER): Payer: 59 | Admitting: Interventional Cardiology

## 2013-08-05 ENCOUNTER — Encounter: Payer: Self-pay | Admitting: General Surgery

## 2013-08-05 ENCOUNTER — Encounter: Payer: Self-pay | Admitting: Interventional Cardiology

## 2013-08-05 ENCOUNTER — Other Ambulatory Visit: Payer: Self-pay | Admitting: General Surgery

## 2013-08-05 VITALS — BP 149/83 | HR 76 | Ht 65.0 in | Wt 280.8 lb

## 2013-08-05 DIAGNOSIS — M1710 Unilateral primary osteoarthritis, unspecified knee: Secondary | ICD-10-CM

## 2013-08-05 DIAGNOSIS — M170 Bilateral primary osteoarthritis of knee: Secondary | ICD-10-CM | POA: Insufficient documentation

## 2013-08-05 DIAGNOSIS — E7800 Pure hypercholesterolemia, unspecified: Secondary | ICD-10-CM | POA: Insufficient documentation

## 2013-08-05 DIAGNOSIS — Z01810 Encounter for preprocedural cardiovascular examination: Secondary | ICD-10-CM | POA: Insufficient documentation

## 2013-08-05 DIAGNOSIS — I10 Essential (primary) hypertension: Secondary | ICD-10-CM | POA: Insufficient documentation

## 2013-08-05 DIAGNOSIS — R002 Palpitations: Secondary | ICD-10-CM

## 2013-08-05 DIAGNOSIS — I452 Bifascicular block: Secondary | ICD-10-CM

## 2013-08-05 DIAGNOSIS — E785 Hyperlipidemia, unspecified: Secondary | ICD-10-CM

## 2013-08-05 NOTE — Progress Notes (Signed)
Patient ID: [name], female   DOB: 03-Oct-1955, 58 y.o.   MRN: [id number]   Date: 08/05/2013 ID: [name], DOB May 23, 1955, MRN [id number] PCP: Garald Balding, MD  Reason: Preoperative cardiovascular exam  ASSESSMENT;  1. Preoperative cardiovascular exam 2. 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 Obesity 4. Hypertension 5. Hyperlipidemia  PLAN:  1. Cleared for upcoming orthopedic knee surgery by Dr. Durward Fortes. Expected risk for anesthesia and rehabilitation is low from a cardiovascular standpoint. No further cardiac evaluation is necessary.   SUBJECTIVE: [name] is a 58 y.o. female who is here for clearance from cardiac standpoint prior to knee surgery. She has no history of cardiac disease. 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 She denies cardiopulmonary complaints. She is relatively inactive due to obesity and arthritis. She specifically denies orthopnea, PND, edema, chest pain, syncope, and claudication. She has had occasional palpitations that have been evaluated in the past without significant arrhythmia identified to   Allergies  Allergen Reactions  . Morphine And Related Other (See Comments)    Feels like bugs crawling over her  . Nsaids Hives  . Ciprofloxacin     Upset stomach   . Codeine     Hyper  . Ibuprofen Hives  . Lodine [Etodolac]     Upset Stomach   . Naproxen Hives  . Niacin And Related     flushing  . Pravastatin     achiness   . Septra [Sulfamethoxazole-Trimethoprim]     Stomach pains  . Tylox [Oxycodone-Acetaminophen]     Hyper  . Wellbutrin [Bupropion]     dizzy  . Buspar [Buspirone] Palpitations    Palpitations    Current Outpatient Prescriptions on File Prior to Visit  Medication Sig Dispense Refill  . ALPRAZolam (XANAX) 0.5 MG tablet Take 0.5 mg by mouth at bedtime as needed.      Marland Kitchen atorvastatin (LIPITOR) 10 MG  tablet Take 10 mg by mouth Daily.      . Cholecalciferol (VITAMIN D-3) 5000 UNITS TABS Take 5,000 Units by mouth daily.      . fexofenadine (ALLEGRA) 180 MG tablet Take 180 mg by mouth daily as needed. For allergies      . methocarbamol (ROBAXIN) 500 MG tablet Take 500 mg by mouth 3 (three) times daily as needed. For muscle spasms      . PARoxetine (PAXIL-CR) 25 MG 24 hr tablet Take 25 mg by mouth daily. 37.5 mg paxil in the am and 25 mg at night.      Marland Kitchen SYNTHROID 150 MCG tablet Take 150 mg by mouth Daily.       No current facility-administered medications on file prior to visit.    Past Medical History  Diagnosis Date  . PONV (postoperative nausea and vomiting)   . Dysrhythmia     rapid HR on occasion-takes Propranolol daily  . Hyperlipidemia     takes LIpitor daily  . Peripheral neuropathy   . Joint pain   . Joint swelling   . Chronic back pain     facet disease and bulding disc  . Rosacea   . GERD (gastroesophageal reflux disease)     takes Prilosec and Zegerid daily  . H/O hiatal hernia   . Constipation     r/t pain meds;takes stool softener daily  . History of bladder infections     > 62yr ago  .  Anxiety     takes Paxil daily and Xanax prn  . Insomnia   . Hypothyroidism     takes Synthroid daily  . Muscle spasms of head or neck     lumbar and thoracic;takes Robaxin prn  . Seasonal allergies     takes allegra prn  . Kidney stones   . Anginal pain     "on and off" (11/01/2011)  . Iron deficiency anemia 2007    "S/P knee replacement"  . Arthritis     "knees" (11/01/2011)    Past Surgical History  Procedure Laterality Date  . Abdominal hysterectomy    . Tubal ligation    . Knee surgery  1982-2013    X 16 on left  . Joint replacement      left knee  . Knee surgery      right x 4  . Anal fissure repair  ~ 2011  . Colonoscopy    . Esophagogastroduodenoscopy    . Knee arthroplasty  2007    left  . Revision total knee arthroplasty  11/01/2011    left   .  Plantar fascia surgery      bilaterally  . Left oophorectomy  1980's  . Total knee revision  11/01/2011    Procedure: TOTAL KNEE REVISION;  Surgeon: Garald Balding, MD;  Location: [facility];  Service: Orthopedics;  Laterality: Left;  Revision Left Total Knee Replacement     History   Social History  . Marital Status: Married    Spouse Name: N/A    Number of Children: N/A  . Years of Education: N/A   Occupational History  . Not on file.   Social History Main Topics  . Smoking status: Never Smoker   . Smokeless tobacco: Never Used  . Alcohol Use: No  . Drug Use: No  . Sexual Activity: Yes    Birth Control/ Protection: Surgical   Other Topics Concern  . Not on file   Social History Narrative  . No narrative on file    No family history on file.  ROS: Denies hemoptysis, weight loss, claudication, syncope, stroke, chest pain, urinary emboli, edema, and prolonged. Other systems negative for complaints.  OBJECTIVE: BP 149/83  Pulse 76  Ht 5\' 5"  (1.651 m)  Wt 280 lb 12.8 oz (127.37 kg)  BMI 46.73 kg/m2,  General: No acute distress, obese HEENT: normal without jaundice or pallor Neck: JVD flat. Carotids absent Chest: Clear Cardiac: Murmur: None. Gallop: None. Rhythm: Normal. Other: Normal Abdomen: Bruit: Absent. Pulsation: Absent Extremities: Edema: None. Pulses: 210 symmetric Neuro: Normal Psych: Normal  ECG: Normal sinus rhythm with incomplete right bundle and left axis deviation. No change when compared to prior tracings within at take and when compared [facility] tracings  ECHOCARDIOGRAM: 2012 1. Left ventricular ejection fraction estimated by 2D at 60-65 percent. 2. Mild left atrial enlargement. 3. Mild mitral valve regurgitation. 4. Normal aortic valve structure and function. 5. Mild tricuspid regurgitation. 6. Analysis of mitral valve inflow, pulmonary vein Doppler and tissue Doppler suggests normal diastolic function without elevated left atrial pressure.

## 2013-08-05 NOTE — Patient Instructions (Signed)
Your physician recommends that you continue on your current medications as directed. Please refer to the Current Medication list given to you today.  You have been cleared from a cardiac stand point for Surgery We will fax the clearance to Dr Durward Fortes today

## 2013-08-05 NOTE — Telephone Encounter (Signed)
Received request from Nurse fax box, documents faxed for surgical clearance. To: [employer] number: [phone] Attention: 8.3.15/km

## 2013-10-03 ENCOUNTER — Encounter (HOSPITAL_COMMUNITY): Payer: Self-pay | Admitting: Pharmacy Technician

## 2013-10-07 ENCOUNTER — Other Ambulatory Visit: Payer: Self-pay | Admitting: Family Medicine

## 2013-10-07 DIAGNOSIS — J328 Other chronic sinusitis: Secondary | ICD-10-CM

## 2013-10-07 NOTE — Pre-Procedure Instructions (Signed)
CAROLYN SYLVIA  10/07/2013   Your procedure is scheduled on:  Tuesday, October 13th  Report to [facility] at [phone] AM.  Call this number if you have problems the morning of surgery: [phone]   Remember:   Do not eat food or drink liquids after midnight.   Take these medicines the morning of surgery with A SIP OF WATER: synthroid, prilosec, paxil, propranolol, pain medication if needed, xanax if needed, flonase  Stop taking aspirin, OTC vitamins/herbal medications, NSAIDS (ibuprofen, advil, motrin) 7 days prior to surgery.   Do not wear jewelry, make-up or nail polish.  Do not wear lotions, powders, or perfumes. You may wear deodorant.  Do not shave 48 hours prior to surgery. Men may shave face and neck.  Do not bring valuables to the hospital.  [facility] is not responsible for any belongings or valuables.               Contacts, dentures or bridgework may not be worn into surgery.  Leave suitcase in the car. After surgery it may be brought to your room.  For patients admitted to the hospital, discharge time is determined by your  treatment team.      Please read over the following fact sheets that you were given: Pain Booklet, Coughing and Deep Breathing, Blood Transfusion Information, MRSA Information and Surgical Site Infection Prevention [place] - Preparing for Surgery  Before surgery, you can play an important role.  Because skin is not sterile, your skin needs to be as free of germs as possible.  You can reduce the number of germs on you skin by washing with CHG (chlorahexidine gluconate) soap before surgery.  CHG is an antiseptic cleaner which kills germs and bonds with the skin to continue killing germs even after washing.  Please DO NOT use if you have an allergy to CHG or antibacterial soaps.  If your skin becomes reddened/irritated stop using the CHG and inform your nurse when you arrive at Short Stay.  Do not shave (including legs and  underarms) for at least 48 hours prior to the first CHG shower.  You may shave your face.  Please follow these instructions carefully:   1.  Shower with CHG Soap the night before surgery and the morning of Surgery.  2.  If you choose to wash your hair, wash your hair first as usual with your normal shampoo.  3.  After you shampoo, rinse your hair and body thoroughly to remove the shampoo.  4.  Use CHG as you would any other liquid soap.  You can apply CHG directly to the skin and wash gently with scrungie or a clean washcloth.  5.  Apply the CHG Soap to your body ONLY FROM THE NECK DOWN.  Do not use on open wounds or open sores.  Avoid contact with your eyes, ears, mouth and genitals (private parts).  Wash genitals (private parts) with your normal soap.  6.  Wash thoroughly, paying special attention to the area where your surgery will be performed.  7.  Thoroughly rinse your body with warm water from the neck down.  8.  DO NOT shower/wash with your normal soap after using and rinsing off the CHG Soap.  9.  Pat yourself dry with a clean towel.            10.  Wear clean pajamas.            11.  Place clean sheets on  your bed the night of your first shower and do not sleep with pets.  Day of Surgery  Do not apply any lotions/deoderants the morning of surgery.  Please wear clean clothes to the hospital/surgery center.

## 2013-10-08 ENCOUNTER — Encounter (HOSPITAL_COMMUNITY): Payer: Self-pay

## 2013-10-08 ENCOUNTER — Ambulatory Visit (HOSPITAL_COMMUNITY)
Admission: RE | Admit: 2013-10-08 | Discharge: 2013-10-08 | Disposition: A | Discharge status: Home / Self Care | Payer: Medicare Other | Source: Ambulatory Visit | Attending: Orthopedic Surgery | Admitting: Orthopedic Surgery

## 2013-10-08 ENCOUNTER — Encounter (HOSPITAL_COMMUNITY)
Admission: RE | Admit: 2013-10-08 | Discharge: 2013-10-08 | Disposition: A | Discharge status: Home / Self Care | Payer: Medicare Other | Source: Ambulatory Visit | Attending: Orthopaedic Surgery | Admitting: Orthopaedic Surgery

## 2013-10-08 ENCOUNTER — Ambulatory Visit
Admission: RE | Admit: 2013-10-08 | Discharge: 2013-10-08 | Disposition: A | Discharge status: Home / Self Care | Payer: Medicare Other | Source: Ambulatory Visit | Attending: Family Medicine | Admitting: Family Medicine

## 2013-10-08 DIAGNOSIS — J328 Other chronic sinusitis: Secondary | ICD-10-CM

## 2013-10-08 DIAGNOSIS — M1711 Unilateral primary osteoarthritis, right knee: Secondary | ICD-10-CM | POA: Insufficient documentation

## 2013-10-08 DIAGNOSIS — Z01818 Encounter for other preprocedural examination: Secondary | ICD-10-CM | POA: Diagnosis not present

## 2013-10-08 HISTORY — DX: Depression, unspecified: F32.A

## 2013-10-08 HISTORY — DX: Major depressive disorder, single episode, unspecified: F32.9

## 2013-10-08 LAB — URINALYSIS, ROUTINE W REFLEX MICROSCOPIC
Bilirubin Urine: NEGATIVE
Glucose, UA: NEGATIVE mg/dL
Hgb urine dipstick: NEGATIVE
Ketones, ur: NEGATIVE mg/dL
Leukocytes, UA: NEGATIVE
Nitrite: NEGATIVE
Protein, ur: NEGATIVE mg/dL
Specific Gravity, Urine: 1.018 (ref 1.005–1.030)
Urobilinogen, UA: 1 mg/dL (ref 0.0–1.0)
pH: 5.5 (ref 5.0–8.0)

## 2013-10-08 LAB — COMPREHENSIVE METABOLIC PANEL
ALT: 17 U/L (ref 0–35)
AST: 23 U/L (ref 0–37)
Albumin: 3.9 g/dL (ref 3.5–5.2)
Alkaline Phosphatase: 72 U/L (ref 39–117)
Anion gap: 13 (ref 5–15)
BUN: 15 mg/dL (ref 6–23)
CO2: 25 mEq/L (ref 19–32)
Calcium: 9.5 mg/dL (ref 8.4–10.5)
Chloride: 104 mEq/L (ref 96–112)
Creatinine, Ser: 0.79 mg/dL (ref 0.50–1.10)
GFR calc Af Amer: >90 mL/min (ref >90)
GFR calc non Af Amer: >90 mL/min (ref >90)
Glucose, Bld: 111 mg/dL — ABNORMAL HIGH (ref 70–99)
Potassium: 4.2 mEq/L (ref 3.7–5.3)
Sodium: 142 mEq/L (ref 137–147)
Total Bilirubin: 0.4 mg/dL (ref 0.3–1.2)
Total Protein: 7.7 g/dL (ref 6.0–8.3)

## 2013-10-08 LAB — TYPE AND SCREEN
ABO/RH(D): A POS
Antibody Screen: NEGATIVE

## 2013-10-08 LAB — CBC WITH DIFFERENTIAL/PLATELET
Basophils Absolute: 0 10*3/uL (ref 0.0–0.1)
Basophils Relative: 1 % (ref 0–1)
Eosinophils Absolute: 0.1 10*3/uL (ref 0.0–0.7)
Eosinophils Relative: 3 % (ref 0–5)
HCT: 37.8 % (ref 36.0–46.0)
Hemoglobin: 12.8 g/dL (ref 12.0–15.0)
Lymphocytes Relative: 39 % (ref 12–46)
Lymphs Abs: 1.8 10*3/uL (ref 0.7–4.0)
MCH: 32.1 pg (ref 26.0–34.0)
MCHC: 33.9 g/dL (ref 30.0–36.0)
MCV: 94.7 fL (ref 78.0–100.0)
Monocytes Absolute: 0.5 10*3/uL (ref 0.1–1.0)
Monocytes Relative: 11 % (ref 3–12)
Neutro Abs: 2.1 10*3/uL (ref 1.7–7.7)
Neutrophils Relative %: 46 % (ref 43–77)
Platelets: 288 10*3/uL (ref 150–400)
RBC: 3.99 MIL/uL (ref 3.87–5.11)
RDW: 13.5 % (ref 11.5–15.5)
WBC: 4.5 10*3/uL (ref 4.0–10.5)

## 2013-10-08 LAB — SURGICAL PCR SCREEN
MRSA, PCR: NEGATIVE
Staphylococcus aureus: NEGATIVE

## 2013-10-08 LAB — APTT: aPTT: 27 seconds (ref 24–37)

## 2013-10-08 LAB — PROTIME-INR
INR: 0.98 (ref 0.00–1.49)
Prothrombin Time: 13 seconds (ref 11.6–15.2)

## 2013-10-08 NOTE — Progress Notes (Signed)
Primary - dr. Kenton Kingfisher Cardiologist - dr. Tamala Julian Cardiac clearance in epic along with ekg

## 2013-10-08 NOTE — Progress Notes (Signed)
10/08/13 1024  OBSTRUCTIVE SLEEP APNEA  Have you ever been diagnosed with sleep apnea through a sleep study? No  Do you snore loudly (loud enough to be heard through closed doors)?  1  Do you often feel tired, fatigued, or sleepy during the daytime? 0  Has anyone observed you stop breathing during your sleep? 0  Do you have, or are you being treated for high blood pressure? 0  BMI more than 35 kg/m2? 1  Age over 58 years old? 1  Neck circumference greater than 40 cm/16 inches? 1  Gender: 0  Obstructive Sleep Apnea Score 4  Score 4 or greater  Results sent to PCP

## 2013-10-10 LAB — URINE CULTURE: Colony Count: 15000

## 2013-10-11 NOTE — H&P (Signed)
CHIEF COMPLAINT:  Painful right knee.   HISTORY OF PRESENT ILLNESS:  [name] is a very pleasant 58 year old white female who is seen today for evaluation of her right knee.  She is status post left total knee arthroplasty and subsequent revision.  Apparently had a total knee replacement done by Dr. Telford Nab, and apparently it became loose.  This had been done in 2006, that of a left LCS performed.  In 2013 she did have a revision performed on it.  She did well with this overall.  Still has pain but otherwise doing well.  However, she has now gotten to the point where she is having constant pain and discomfort in the right knee.  She has previously had a patellectomy performed.  She has pain with every step and must use a cane now to be able to function.  She has nighttime pain and cannot sleep.  She is on chronic pain medicine with Dilaudid because of this pain.  She is also using methocarbamol.  She has fairly good motion, she states, but again it is quite painful.  Especially prolonged sitting and walking cause her increasing pain and discomfort.  She does have swelling of the right knee also associated with activity which is larger with activity and less without.  She has had multiple cortisone injections and actually has just finished up a series of viscosupplementation on 05/01/2013.  Again, symptoms are worsening in the right knee to the point where she cannot take the pain any more.  Her bad days outnumber her good.  Seen today for evaluation.   PAST MEDICAL HISTORY:  In general, health is good.   PAST SURGICAL HISTORY/HOSPITALIZATIONS:  She has had 18 left knee surgeries and 3 right knee surgeries.  She had a left total knee arthroplasty in 2006 and revision in 2013.  She has had 2 foot surgeries.  She has had a hysterectomy and oophorectomy.  She has had 2 children, 1 in 38 and 1 in 1981.   MEDICATIONS:  Include Synthroid 150 mg daily, Paxil CR 37.5 q. a.m., propranolol 80 mg daily q. a.m., Paxil  CR 25 mg at bedtime, omeprazole 40 mg b.i.d., methocarbamol 750 mg p.r.n.; hydromorphone 2 mg 1 to 2 tabs every 6 hours, managed by pain medicine; atorvastatin 10 mg daily at bedtime, Astepro 0.15% 2 puffs daily, Zyrtec 10 mg daily, Singulair 10 mg daily p.r.n., Xanax 0.5 mg p.r.n., vitamin D 5000 daily, multivitamin daily, 81 mg aspirin daily.   ALLERGIES:  MORPHINE and IBUPROFEN.   FAMILY HISTORY:  Mother who died at age 71 from cor pulmonale.  Father who died at 63 in his sleep from a myocardial infarction.  Brother who is alive at 82, healthy.  Four sisters, ages 30, 12, 67, and 32 with the last one having lymphoma x2.   SOCIAL HISTORY:  [name] is a 58 year old white married female on Social Security disability from [employer] [employer] [facility].  She does not smoke or use alcohol.   FOURTEEN-POINT REVIEW OF SYSTEMS:  Positive for glasses.  She does have tachycardia, but she bases it on anxiety.  She does have also chest pain with the tachycardia and has palpitations which are controlled with propranolol and Xanax.  She is hypothyroid.  She does have nervous tension and depression, basically an anxiety depression which is under control at this time.   PHYSICAL EXAMINATION:  General:  Today reveals a 58 year old white female, well developed, well nourished, obese, alert, pleasant, cooperative in moderate distress secondary to  right knee pain.  Height is 5 feet 4-1/2 inches with weight 279 pounds.  BMI is 47.1. Vital Signs:  Temperature 98.6.  Pulse 60.  Respiration 16.  Blood pressure 142/80. Head:  Normocephalic.  Eyes:  Pupils equal, round, and reactive to light and accommodation with extraocular movements intact. Ears/Nose/Throat:  Benign. Neck:  Supple.  No bruits were noted. Chest:  Had good expansion. Lungs:  Essentially clear. Cardiac:  Had a regular rhythm and rate with distant heart sounds.  No murmurs were noted. Pulses:  Were trace to 1+, bilateral and symmetric in the lower  extremities. Abdomen:  Scaphoid, soft, nontender, obese.  No mass palpable.  Normal bowel sounds present. CNS:  She is oriented x3, and cranial nerves II through XII grossly intact. Breast/Rectal/Genital:  Not indicated for an orthopedic procedure. Musculoskeletal:  Today she has a trace to 1+ effusion in the right knee.  She has range of motion from about 3 to 4 degrees to 105 degrees.  Her calf is supple and nontender.  She is neurovascularly intact distally.   RADIOGRAPHS:  Reveals the right knee to have marked narrowing of the medial joint line and periarticular spurring.  She does not have a patella on x-ray in either knee.   ASSESSMENT:  1.  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 2.  Status post left total knee arthroplasty with revision total knee arthroplasty. 3.  Obesity. 4.  Anxiety and depression.   PLAN:  At this time I have read the notation from [facility] that of Pernell Dupre, MD for cardiology.  He felt that she was cleared for an upcoming total knee surgery, and that was low from a cardiovascular standpoint.  Also has had evaluation by Dr. Kenton Kingfisher who felt that from a medical standpoint she was stable enough to consider a major surgery.  Therefore, our plan is to proceed with a right total knee arthroplasty.  Procedure, risks, and benefits were fully explained to her.  All questions were answered.  Appropriate models were used to show her the surgical procedure.  She had no further questions.  We will plan on doing this in the very near future.  Mike Craze [place], [place] [phone]  10/11/2013 8:34 AM

## 2013-10-14 MED ORDER — ACETAMINOPHEN 10 MG/ML IV SOLN
1000.0000 mg | Freq: Four times a day (QID) | INTRAVENOUS | Status: DC
  Administered 2013-10-15: 1000 mg via INTRAVENOUS
  Filled 2013-10-14: qty 100

## 2013-10-14 MED ORDER — DEXTROSE 5 % IV SOLN
3.0000 g | INTRAVENOUS | Status: AC
  Administered 2013-10-15: 3 g via INTRAVENOUS
  Filled 2013-10-14: qty 3000

## 2013-10-15 ENCOUNTER — Encounter (HOSPITAL_COMMUNITY): Payer: Self-pay | Admitting: Certified Registered Nurse Anesthetist

## 2013-10-15 ENCOUNTER — Encounter (HOSPITAL_COMMUNITY): Payer: Medicare Other | Admitting: Certified Registered Nurse Anesthetist

## 2013-10-15 ENCOUNTER — Encounter (HOSPITAL_COMMUNITY)
Admission: RE | Disposition: A | Discharge status: Home Health | Payer: Self-pay | Source: Ambulatory Visit | Attending: Orthopaedic Surgery

## 2013-10-15 ENCOUNTER — Inpatient Hospital Stay (HOSPITAL_COMMUNITY): Payer: Medicare Other | Admitting: Certified Registered Nurse Anesthetist

## 2013-10-15 ENCOUNTER — Inpatient Hospital Stay (HOSPITAL_COMMUNITY)
Admission: RE | Admit: 2013-10-15 | Discharge: 2013-10-17 | DRG: 470 | Disposition: A | Discharge status: Home Health | Payer: Medicare Other | Source: Ambulatory Visit | Attending: Orthopaedic Surgery | Admitting: Orthopaedic Surgery

## 2013-10-15 DIAGNOSIS — M1712 Unilateral primary osteoarthritis, left knee: Secondary | ICD-10-CM | POA: Diagnosis present

## 2013-10-15 DIAGNOSIS — K219 Gastro-esophageal reflux disease without esophagitis: Secondary | ICD-10-CM | POA: Diagnosis present

## 2013-10-15 DIAGNOSIS — Z6841 Body Mass Index (BMI) 40.0 and over, adult: Secondary | ICD-10-CM | POA: Diagnosis not present

## 2013-10-15 DIAGNOSIS — E039 Hypothyroidism, unspecified: Secondary | ICD-10-CM | POA: Diagnosis present

## 2013-10-15 DIAGNOSIS — I10 Essential (primary) hypertension: Secondary | ICD-10-CM | POA: Diagnosis present

## 2013-10-15 DIAGNOSIS — K449 Diaphragmatic hernia without obstruction or gangrene: Secondary | ICD-10-CM | POA: Diagnosis present

## 2013-10-15 DIAGNOSIS — F418 Other specified anxiety disorders: Secondary | ICD-10-CM | POA: Diagnosis present

## 2013-10-15 DIAGNOSIS — Z96652 Presence of left artificial knee joint: Secondary | ICD-10-CM | POA: Diagnosis present

## 2013-10-15 DIAGNOSIS — M1711 Unilateral primary osteoarthritis, right knee: Secondary | ICD-10-CM | POA: Diagnosis present

## 2013-10-15 HISTORY — PX: TOTAL KNEE ARTHROPLASTY: SHX125

## 2013-10-15 HISTORY — DX: Family history of other specified conditions: Z84.89

## 2013-10-15 SURGERY — ARTHROPLASTY, KNEE, TOTAL
Anesthesia: Monitor Anesthesia Care | Site: Knee | Laterality: Right

## 2013-10-15 MED ORDER — ATORVASTATIN CALCIUM 10 MG PO TABS
10.0000 mg | ORAL_TABLET | Freq: Every day | ORAL | Status: DC
  Administered 2013-10-15 – 2013-10-16 (×2): 10 mg via ORAL
  Filled 2013-10-15 (×3): qty 1

## 2013-10-15 MED ORDER — OXYCODONE HCL 5 MG PO TABS: 5.0000 mg | ORAL_TABLET | Freq: Once | ORAL | Status: DC | PRN

## 2013-10-15 MED ORDER — PROPOFOL 10 MG/ML IV BOLUS
INTRAVENOUS | Status: AC
Start: 2013-10-15 — End: 2013-10-15
  Filled 2013-10-15: qty 20

## 2013-10-15 MED ORDER — FENTANYL CITRATE 0.05 MG/ML IJ SOLN
INTRAMUSCULAR | Status: AC
  Filled 2013-10-15: qty 5

## 2013-10-15 MED ORDER — MONTELUKAST SODIUM 10 MG PO TABS
10.0000 mg | ORAL_TABLET | Freq: Every day | ORAL | Status: DC
  Administered 2013-10-16: 10 mg via ORAL
  Filled 2013-10-15 (×3): qty 1

## 2013-10-15 MED ORDER — LACTATED RINGERS IV SOLN
INTRAVENOUS | Status: DC
  Administered 2013-10-15: 10:00:00 via INTRAVENOUS

## 2013-10-15 MED ORDER — MENTHOL 3 MG MT LOZG: 1.0000 | LOZENGE | OROMUCOSAL | Status: DC | PRN

## 2013-10-15 MED ORDER — MIDAZOLAM HCL 2 MG/2ML IJ SOLN
INTRAMUSCULAR | Status: AC
  Administered 2013-10-15: 2 mg via INTRAVENOUS
  Filled 2013-10-15: qty 2

## 2013-10-15 MED ORDER — FENTANYL CITRATE 0.05 MG/ML IJ SOLN
50.0000 ug | INTRAMUSCULAR | Status: DC | PRN
  Administered 2013-10-15: 100 ug via INTRAVENOUS

## 2013-10-15 MED ORDER — DOCUSATE SODIUM 100 MG PO CAPS
100.0000 mg | ORAL_CAPSULE | Freq: Two times a day (BID) | ORAL | Status: DC
  Administered 2013-10-15 – 2013-10-17 (×4): 100 mg via ORAL
  Filled 2013-10-15 (×6): qty 1

## 2013-10-15 MED ORDER — ACETAMINOPHEN 10 MG/ML IV SOLN
1000.0000 mg | Freq: Four times a day (QID) | INTRAVENOUS | Status: AC
  Administered 2013-10-15 – 2013-10-16 (×4): 1000 mg via INTRAVENOUS
  Filled 2013-10-15 (×4): qty 100

## 2013-10-15 MED ORDER — MAGNESIUM CITRATE PO SOLN: 1.0000 | Freq: Once | ORAL | Status: AC | PRN

## 2013-10-15 MED ORDER — SODIUM CHLORIDE 0.9 % IV SOLN: INTRAVENOUS | Status: DC

## 2013-10-15 MED ORDER — FENTANYL CITRATE 0.05 MG/ML IJ SOLN
INTRAMUSCULAR | Status: DC | PRN
Start: 2013-10-15 — End: 2013-10-15
  Administered 2013-10-15 (×3): 50 ug via INTRAVENOUS

## 2013-10-15 MED ORDER — ARTIFICIAL TEARS OP OINT
TOPICAL_OINTMENT | OPHTHALMIC | Status: AC
  Filled 2013-10-15: qty 3.5

## 2013-10-15 MED ORDER — METHOCARBAMOL 500 MG PO TABS
500.0000 mg | ORAL_TABLET | Freq: Four times a day (QID) | ORAL | Status: DC | PRN
  Administered 2013-10-15 – 2013-10-17 (×3): 500 mg via ORAL
  Filled 2013-10-15 (×3): qty 1

## 2013-10-15 MED ORDER — FLUTICASONE PROPIONATE 50 MCG/ACT NA SUSP
1.0000 | Freq: Every day | NASAL | Status: DC | PRN
  Filled 2013-10-15: qty 16

## 2013-10-15 MED ORDER — ALPRAZOLAM 0.25 MG PO TABS
0.2500 mg | ORAL_TABLET | Freq: Three times a day (TID) | ORAL | Status: DC | PRN
  Administered 2013-10-16 (×2): 0.25 mg via ORAL
  Filled 2013-10-15 (×2): qty 1

## 2013-10-15 MED ORDER — ACETAMINOPHEN 325 MG PO TABS: 325.0000 mg | ORAL_TABLET | ORAL | Status: DC | PRN

## 2013-10-15 MED ORDER — CHLORHEXIDINE GLUCONATE 4 % EX LIQD: 60.0000 mL | Freq: Once | CUTANEOUS | Status: DC

## 2013-10-15 MED ORDER — ONDANSETRON HCL 4 MG PO TABS: 4.0000 mg | ORAL_TABLET | Freq: Four times a day (QID) | ORAL | Status: DC | PRN

## 2013-10-15 MED ORDER — AZELASTINE HCL 0.1 % NA SOLN
2.0000 | Freq: Two times a day (BID) | NASAL | Status: DC
  Administered 2013-10-16 (×2): 2 via NASAL
  Filled 2013-10-15: qty 30

## 2013-10-15 MED ORDER — METOCLOPRAMIDE HCL 5 MG/ML IJ SOLN
5.0000 mg | Freq: Three times a day (TID) | INTRAMUSCULAR | Status: DC | PRN
  Administered 2013-10-15: 10 mg via INTRAVENOUS
  Filled 2013-10-15: qty 2

## 2013-10-15 MED ORDER — OXYCODONE HCL 5 MG/5ML PO SOLN: 5.0000 mg | Freq: Once | ORAL | Status: DC | PRN

## 2013-10-15 MED ORDER — ONDANSETRON HCL 4 MG/2ML IJ SOLN
INTRAMUSCULAR | Status: AC
  Filled 2013-10-15: qty 2

## 2013-10-15 MED ORDER — GLYCOPYRROLATE 0.2 MG/ML IJ SOLN
INTRAMUSCULAR | Status: AC
  Filled 2013-10-15: qty 3

## 2013-10-15 MED ORDER — METHOCARBAMOL 1000 MG/10ML IJ SOLN
500.0000 mg | Freq: Four times a day (QID) | INTRAVENOUS | Status: DC | PRN
  Filled 2013-10-15: qty 5

## 2013-10-15 MED ORDER — ACETAMINOPHEN 10 MG/ML IV SOLN
1000.0000 mg | INTRAVENOUS | Status: DC
  Filled 2013-10-15: qty 100

## 2013-10-15 MED ORDER — ACETAMINOPHEN 160 MG/5ML PO SOLN
325.0000 mg | ORAL | Status: DC | PRN
  Filled 2013-10-15: qty 20.3

## 2013-10-15 MED ORDER — MIDAZOLAM HCL 5 MG/5ML IJ SOLN
INTRAMUSCULAR | Status: DC | PRN
  Administered 2013-10-15 (×2): 1 mg via INTRAVENOUS

## 2013-10-15 MED ORDER — ONDANSETRON HCL 4 MG/2ML IJ SOLN
4.0000 mg | Freq: Four times a day (QID) | INTRAMUSCULAR | Status: DC | PRN
  Administered 2013-10-15 – 2013-10-16 (×2): 4 mg via INTRAVENOUS
  Filled 2013-10-15 (×2): qty 2

## 2013-10-15 MED ORDER — LIDOCAINE 5 % EX PTCH
1.0000 | MEDICATED_PATCH | Freq: Two times a day (BID) | CUTANEOUS | Status: DC | PRN
  Filled 2013-10-15: qty 1

## 2013-10-15 MED ORDER — PROPOFOL INFUSION 10 MG/ML OPTIME
INTRAVENOUS | Status: DC | PRN
  Administered 2013-10-15: 14:00:00 via INTRAVENOUS
  Administered 2013-10-15: 200 ug/kg/min via INTRAVENOUS
  Administered 2013-10-15: 14:00:00 via INTRAVENOUS

## 2013-10-15 MED ORDER — FENTANYL CITRATE 0.05 MG/ML IJ SOLN
INTRAMUSCULAR | Status: AC
  Administered 2013-10-15: 100 ug via INTRAVENOUS
  Filled 2013-10-15: qty 2

## 2013-10-15 MED ORDER — SODIUM CHLORIDE 0.9 % IV SOLN
INTRAVENOUS | Status: DC
  Administered 2013-10-15: 75 mL/h via INTRAVENOUS

## 2013-10-15 MED ORDER — LORATADINE 10 MG PO TABS
10.0000 mg | ORAL_TABLET | Freq: Every day | ORAL | Status: DC
  Administered 2013-10-15 – 2013-10-17 (×3): 10 mg via ORAL
  Filled 2013-10-15 (×3): qty 1

## 2013-10-15 MED ORDER — ROCURONIUM BROMIDE 50 MG/5ML IV SOLN
INTRAVENOUS | Status: AC
  Filled 2013-10-15: qty 1

## 2013-10-15 MED ORDER — LEVOTHYROXINE SODIUM 150 MCG PO TABS
150.0000 ug | ORAL_TABLET | Freq: Every day | ORAL | Status: DC
  Administered 2013-10-16 – 2013-10-17 (×2): 150 ug via ORAL
  Filled 2013-10-15 (×3): qty 1

## 2013-10-15 MED ORDER — PHENOL 1.4 % MT LIQD: 1.0000 | OROMUCOSAL | Status: DC | PRN

## 2013-10-15 MED ORDER — NEOSTIGMINE METHYLSULFATE 10 MG/10ML IV SOLN
INTRAVENOUS | Status: AC
  Filled 2013-10-15: qty 1

## 2013-10-15 MED ORDER — MIDAZOLAM HCL 2 MG/2ML IJ SOLN
INTRAMUSCULAR | Status: AC
  Filled 2013-10-15: qty 2

## 2013-10-15 MED ORDER — LACTATED RINGERS IV SOLN
INTRAVENOUS | Status: DC | PRN
  Administered 2013-10-15 (×2): via INTRAVENOUS

## 2013-10-15 MED ORDER — HYDROMORPHONE HCL 1 MG/ML IJ SOLN
0.5000 mg | INTRAMUSCULAR | Status: DC | PRN
  Administered 2013-10-15 – 2013-10-16 (×4): 1 mg via INTRAVENOUS
  Filled 2013-10-15 (×4): qty 1

## 2013-10-15 MED ORDER — PROPRANOLOL HCL ER 80 MG PO CP24
80.0000 mg | ORAL_CAPSULE | Freq: Every day | ORAL | Status: DC
  Administered 2013-10-16 – 2013-10-17 (×2): 80 mg via ORAL
  Filled 2013-10-15 (×3): qty 1

## 2013-10-15 MED ORDER — PANTOPRAZOLE SODIUM 40 MG PO TBEC
80.0000 mg | DELAYED_RELEASE_TABLET | Freq: Every day | ORAL | Status: DC
  Administered 2013-10-16 – 2013-10-17 (×2): 80 mg via ORAL
  Filled 2013-10-15 (×3): qty 2

## 2013-10-15 MED ORDER — ROPIVACAINE HCL 5 MG/ML IJ SOLN
INTRAMUSCULAR | Status: DC | PRN
  Administered 2013-10-15: 20 mL via PERINEURAL

## 2013-10-15 MED ORDER — HYDROMORPHONE HCL 1 MG/ML IJ SOLN: 0.2500 mg | INTRAMUSCULAR | Status: DC | PRN

## 2013-10-15 MED ORDER — BUPIVACAINE-EPINEPHRINE 0.25% -1:200000 IJ SOLN
INTRAMUSCULAR | Status: DC | PRN
  Administered 2013-10-15: 30 mL

## 2013-10-15 MED ORDER — ALUM & MAG HYDROXIDE-SIMETH 200-200-20 MG/5ML PO SUSP: 30.0000 mL | ORAL | Status: DC | PRN

## 2013-10-15 MED ORDER — HYDROMORPHONE HCL 2 MG PO TABS
2.0000 mg | ORAL_TABLET | ORAL | Status: DC | PRN
  Administered 2013-10-15: 2 mg via ORAL
  Administered 2013-10-16: 4 mg via ORAL
  Administered 2013-10-16 – 2013-10-17 (×8): 2 mg via ORAL
  Filled 2013-10-15 (×11): qty 1

## 2013-10-15 MED ORDER — BUPIVACAINE-EPINEPHRINE (PF) 0.25% -1:200000 IJ SOLN
INTRAMUSCULAR | Status: AC
  Filled 2013-10-15: qty 30

## 2013-10-15 MED ORDER — SENNOSIDES-DOCUSATE SODIUM 8.6-50 MG PO TABS: 1.0000 | ORAL_TABLET | Freq: Every evening | ORAL | Status: DC | PRN

## 2013-10-15 MED ORDER — CEFAZOLIN SODIUM-DEXTROSE 2-3 GM-% IV SOLR
2.0000 g | Freq: Four times a day (QID) | INTRAVENOUS | Status: AC
  Administered 2013-10-15 (×2): 2 g via INTRAVENOUS
  Filled 2013-10-15 (×2): qty 50

## 2013-10-15 MED ORDER — METOCLOPRAMIDE HCL 10 MG PO TABS: 5.0000 mg | ORAL_TABLET | Freq: Three times a day (TID) | ORAL | Status: DC | PRN

## 2013-10-15 MED ORDER — BISACODYL 5 MG PO TBEC: 5.0000 mg | DELAYED_RELEASE_TABLET | Freq: Every day | ORAL | Status: DC | PRN

## 2013-10-15 MED ORDER — SODIUM CHLORIDE 0.9 % IR SOLN
Status: DC | PRN
  Administered 2013-10-15: 1000 mL

## 2013-10-15 MED ORDER — CHLORHEXIDINE GLUCONATE 4 % EX LIQD: 60.0000 mL | Freq: Every day | CUTANEOUS | Status: DC

## 2013-10-15 MED ORDER — BUPIVACAINE IN DEXTROSE 0.75-8.25 % IT SOLN
INTRATHECAL | Status: DC | PRN
  Administered 2013-10-15: 2 mL via INTRATHECAL

## 2013-10-15 MED ORDER — MIDAZOLAM HCL 2 MG/2ML IJ SOLN
1.0000 mg | INTRAMUSCULAR | Status: DC | PRN
  Administered 2013-10-15: 2 mg via INTRAVENOUS

## 2013-10-15 MED ORDER — RIVAROXABAN 10 MG PO TABS
10.0000 mg | ORAL_TABLET | Freq: Every day | ORAL | Status: DC
  Administered 2013-10-16 – 2013-10-17 (×2): 10 mg via ORAL
  Filled 2013-10-15 (×2): qty 1

## 2013-10-15 MED ORDER — LIDOCAINE HCL (CARDIAC) 20 MG/ML IV SOLN
INTRAVENOUS | Status: AC
  Filled 2013-10-15: qty 5

## 2013-10-15 MED ORDER — PAROXETINE HCL ER 25 MG PO TB24
25.0000 mg | ORAL_TABLET | Freq: Every day | ORAL | Status: DC
  Administered 2013-10-15 – 2013-10-17 (×3): 25 mg via ORAL
  Filled 2013-10-15 (×4): qty 1

## 2013-10-15 SURGICAL SUPPLY — 59 items
BANDAGE ESMARK 6X9 LF (GAUZE/BANDAGES/DRESSINGS) ×1 IMPLANT
BLADE SAGITTAL 25.0X1.19X90 (BLADE) ×4 IMPLANT
BNDG ESMARK 6X9 LF (GAUZE/BANDAGES/DRESSINGS) ×2
BOWL SMART MIX CTS (DISPOSABLE) ×2 IMPLANT
CAP UPCHARGE REVISION TRAY ×2 IMPLANT
CAPT RP KNEE ×2 IMPLANT
CEMENT HV SMART SET (Cement) ×4 IMPLANT
COVER SURGICAL LIGHT HANDLE (MISCELLANEOUS) ×2 IMPLANT
CUFF TOURNIQUET SINGLE 34IN LL (TOURNIQUET CUFF) IMPLANT
CUFF TOURNIQUET SINGLE 44IN (TOURNIQUET CUFF) ×2 IMPLANT
DRAPE EXTREMITY T 121X128X90 (DRAPE) ×2 IMPLANT
DRAPE PROXIMA HALF (DRAPES) ×2 IMPLANT
DRSG ADAPTIC 3X8 NADH LF (GAUZE/BANDAGES/DRESSINGS) ×2 IMPLANT
DRSG PAD ABDOMINAL 8X10 ST (GAUZE/BANDAGES/DRESSINGS) ×2 IMPLANT
DURAPREP 26ML APPLICATOR (WOUND CARE) ×2 IMPLANT
ELECT CAUTERY BLADE 6.4 (BLADE) ×2 IMPLANT
ELECT REM PT RETURN 9FT ADLT (ELECTROSURGICAL) ×2
ELECTRODE REM PT RTRN 9FT ADLT (ELECTROSURGICAL) ×1 IMPLANT
EVACUATOR 1/8 PVC DRAIN (DRAIN) IMPLANT
FACESHIELD WRAPAROUND (MASK) ×4 IMPLANT
FLOSEAL 10ML (HEMOSTASIS) IMPLANT
GAUZE SPONGE 4X4 12PLY STRL (GAUZE/BANDAGES/DRESSINGS) ×2 IMPLANT
GLOVE BIOGEL PI IND STRL 8 (GLOVE) ×1 IMPLANT
GLOVE BIOGEL PI IND STRL 8.5 (GLOVE) ×1 IMPLANT
GLOVE BIOGEL PI INDICATOR 8 (GLOVE) ×1
GLOVE BIOGEL PI INDICATOR 8.5 (GLOVE) ×1
GLOVE ECLIPSE 8.0 STRL XLNG CF (GLOVE) ×4 IMPLANT
GLOVE SURG ORTHO 8.5 STRL (GLOVE) ×4 IMPLANT
GOWN STRL REUS W/ TWL LRG LVL3 (GOWN DISPOSABLE) ×2 IMPLANT
GOWN STRL REUS W/TWL 2XL LVL3 (GOWN DISPOSABLE) ×2 IMPLANT
GOWN STRL REUS W/TWL LRG LVL3 (GOWN DISPOSABLE) ×2
HANDPIECE INTERPULSE COAX TIP (DISPOSABLE) ×1
KIT BASIN OR (CUSTOM PROCEDURE TRAY) ×2 IMPLANT
KIT ROOM TURNOVER OR (KITS) ×2 IMPLANT
MANIFOLD NEPTUNE II (INSTRUMENTS) ×2 IMPLANT
NEEDLE 22X1 1/2 (OR ONLY) (NEEDLE) IMPLANT
NS IRRIG 1000ML POUR BTL (IV SOLUTION) ×2 IMPLANT
PACK TOTAL JOINT (CUSTOM PROCEDURE TRAY) ×2 IMPLANT
PAD ARMBOARD 7.5X6 YLW CONV (MISCELLANEOUS) ×4 IMPLANT
PAD CAST 4YDX4 CTTN HI CHSV (CAST SUPPLIES) ×1 IMPLANT
PADDING CAST COTTON 4X4 STRL (CAST SUPPLIES) ×1
PADDING CAST COTTON 6X4 STRL (CAST SUPPLIES) ×2 IMPLANT
SET HNDPC FAN SPRY TIP SCT (DISPOSABLE) ×1 IMPLANT
SPONGE GAUZE 4X4 12PLY STER LF (GAUZE/BANDAGES/DRESSINGS) ×2 IMPLANT
STAPLER VISISTAT 35W (STAPLE) ×2 IMPLANT
SUCTION FRAZIER TIP 10 FR DISP (SUCTIONS) ×2 IMPLANT
SUT BONE WAX W31G (SUTURE) ×2 IMPLANT
SUT ETHIBOND NAB CT1 #1 30IN (SUTURE) ×4 IMPLANT
SUT MNCRL AB 3-0 PS2 18 (SUTURE) ×2 IMPLANT
SUT VIC AB 0 CT1 27 (SUTURE) ×1
SUT VIC AB 0 CT1 27XBRD ANBCTR (SUTURE) ×1 IMPLANT
SUT VIC AB 1 CT1 27 (SUTURE) ×1
SUT VIC AB 1 CT1 27XBRD ANBCTR (SUTURE) ×1 IMPLANT
SYR CONTROL 10ML LL (SYRINGE) IMPLANT
TOWEL OR 17X24 6PK STRL BLUE (TOWEL DISPOSABLE) ×2 IMPLANT
TOWEL OR 17X26 10 PK STRL BLUE (TOWEL DISPOSABLE) ×2 IMPLANT
TRAY FOLEY CATH 16FRSI W/METER (SET/KITS/TRAYS/PACK) ×2 IMPLANT
WATER STERILE IRR 1000ML POUR (IV SOLUTION) ×4 IMPLANT
WRAP KNEE MAXI GEL POST OP (GAUZE/BANDAGES/DRESSINGS) ×2 IMPLANT

## 2013-10-15 NOTE — Progress Notes (Signed)
Utilization review completed.  

## 2013-10-15 NOTE — Anesthesia Procedure Notes (Addendum)
Anesthesia Regional Block:  Femoral nerve block  Pre-Anesthetic Checklist: ,, timeout performed, Correct Patient, Correct Site, Correct Laterality, Correct Procedure, Correct Position, site marked, Risks and benefits discussed,  Surgical consent,  Pre-op evaluation,  At surgeon's request and post-op pain management  Laterality: Lower and Right  Prep: chloraprep       Needles:  Injection technique: Single-shot  Needle Type: Echogenic Stimulator Needle          Additional Needles:  Procedures: ultrasound guided (picture in chart) and nerve stimulator Femoral nerve block  Nerve Stimulator or Paresthesia:  Response: quad, 0.5 mA,   Additional Responses:   Narrative:  Start time: 10/15/2013 12:19 PM End time: 10/15/2013 12:27 PM Injection made incrementally with aspirations every 5 mL.  Performed by: Personally  Anesthesiologist: Miachel Nardelli  Additional Notes: H+P and labs reviewed, risks and benefits discussed with patient, procedure tolerated well without complications   Spinal  Patient location during procedure: OR Start time: 10/15/2013 12:49 PM End time: 10/15/2013 12:53 PM Staffing Anesthesiologist: Jazelyn Sipe, CHRIS Preanesthetic Checklist Completed: patient identified, surgical consent, pre-op evaluation, timeout performed, IV checked, risks and benefits discussed and monitors and equipment checked Spinal Block Patient position: sitting Prep: site prepped and draped and DuraPrep Patient monitoring: heart rate, cardiac monitor, continuous pulse ox and blood pressure Approach: midline Location: L4-5 Injection technique: single-shot Needle Needle type: Pencan  Needle gauge: 24 G Needle length: 10 cm Assessment Sensory level: T6

## 2013-10-15 NOTE — Progress Notes (Signed)
Orthopedic Tech Progress Note Patient Details:  [name] [birth year] [id number]  CPM Right Knee CPM Right Knee: On Right Knee Flexion (Degrees): 60 Right Knee Extension (Degrees): 0 Additional Comments: applied ohf to bed   Braulio Bosch 10/15/2013, 3:49 PM

## 2013-10-15 NOTE — Transfer of Care (Signed)
Immediate Anesthesia Transfer of Care Note  Patient: [name]  Procedure(s) Performed: Procedure(s): RIGHT TOTAL KNEE ARTHROPLASTY (Right)  Patient Location: PACU  Anesthesia Type:MAC  Level of Consciousness: awake, alert , oriented and patient cooperative  Airway & Oxygen Therapy: Patient Spontanous Breathing and Patient connected to nasal cannula oxygen  Post-op Assessment: Report given to PACU RN, Post -op Vital signs reviewed and stable and Patient moving all extremities  Post vital signs: Reviewed and stable  Complications: No apparent anesthesia complications

## 2013-10-15 NOTE — H&P (Signed)
  The recent History & Physical has been reviewed. I have personally examined the patient today. There is no interval change to the documented History & Physical. The patient would like to proceed with the procedure.  [name] W 10/15/2013,  11:47 AM

## 2013-10-15 NOTE — Op Note (Signed)
PATIENT ID:      [name]  MRN:     [id number] DOB/AGE:    [birth year] / 58 y.o.       OPERATIVE REPORT    DATE OF PROCEDURE:  10/15/2013       PREOPERATIVE DIAGNOSIS:   RIGHT KNEE OSTEOARTHRITIS-END STAGE                                                       Estimated body mass index is 47.17 kg/(m^2) as calculated from the following:   Height as of 10/08/13: 5' 4.5" (1.638 m).   Weight as of this encounter: 126.554 kg (279 lb).     POSTOPERATIVE DIAGNOSIS:   RIGHT KNEE OSTEOARTHRITIS-END STAGE                                                                     Estimated body mass index is 47.17 kg/(m^2) as calculated from the following:   Height as of 10/08/13: 5' 4.5" (1.638 m).   Weight as of this encounter: 126.554 kg (279 lb).     PROCEDURE:  Procedure(s): RIGHT TOTAL KNEE ARTHROPLASTY     SURGEON:  [name], MD    ASSISTANT:   Biagio Borg, PA-C   (Present and scrubbed throughout the case, critical for assistance with exposure, retraction, instrumentation, and closure.)          ANESTHESIA: SPINAL    DRAINS: (RIGHT KNEE) Hemovact drain(s) in the CLAMPED with  Suction Clamped :      TOURNIQUET TIME:  Total Tourniquet Time Documented: Thigh (Right) - 81 minutes Total: Thigh (Right) - 81 minutes     COMPLICATIONS:  NONE  CONDITION:  stable  PROCEDURE IN DETAIL: [place], [name] 10/15/2013, 2:47 PM

## 2013-10-15 NOTE — Anesthesia Postprocedure Evaluation (Signed)
  Anesthesia Post-op Note  Patient: [name]  Procedure(s) Performed: Procedure(s): RIGHT TOTAL KNEE ARTHROPLASTY (Right)  Patient Location: PACU  Anesthesia Type:Regional and Spinal  Level of Consciousness: awake  Airway and Oxygen Therapy: Patient Spontanous Breathing  Post-op Pain: none  Post-op Assessment: Post-op Vital signs reviewed, Patient's Cardiovascular Status Stable, Respiratory Function Stable, Patent Airway, No signs of Nausea or vomiting and Pain level controlled  Post-op Vital Signs: Reviewed and stable  Last Vitals:  Filed Vitals:   10/15/13 1623  BP: 115/72  Pulse: 62  Temp: 36.4 C  Resp: 14    Complications: No apparent anesthesia complications

## 2013-10-15 NOTE — Op Note (Signed)
[name], [name]               ACCOUNT NO.:  [id number]  MEDICAL RECORD NO.:  [id number]  LOCATION:  [id number]                        FACILITY:  [facility]  PHYSICIAN:  Vonna Kotyk. Whitfield, M.D.DATE OF BIRTH:  05/28/1955  DATE OF PROCEDURE:  10/15/2013 DATE OF DISCHARGE:                              OPERATIVE REPORT   PREOPERATIVE DIAGNOSES: 1. End-stage osteoarthritis, right knee. 2. Morbid obesity (BMI 47).  POSTOPERATIVE DIAGNOSES: 1. End-stage osteoarthritis, right knee. 2. Morbid obesity (BMI 47).  PROCEDURE:  Right total knee replacement.  SURGEON:  Vonna Kotyk. Durward Fortes, M.D.  ASSISTANT:  Aaron Edelman D. Petrarca, P.A.-C. was present throughout the operative procedure to ensure its timely completion.  ANESTHESIA:  Spinal.  COMPLICATIONS:  None.  COMPONENTS:  DePuy LCS standard femoral component and MBT revision, tibial tray #2.5, a 10-mm polyethylene bridging bearing.  The patient has had a prior patellectomy and accordingly, no patella component was utilized.  Components were secured with polymethyl methacrylate.  DESCRIPTION OF PROCEDURE:  [name] was met in the holding area and identified the right knee as appropriate operative site and marked it accordingly.  She was then transported to room #7 and placed under spinal anesthetic with minimal IV sedation without difficulty.  She was placed in the supine position.  The nursing staff inserted a Foley catheter.  Tourniquet was then applied to the left thigh and a time-out was called.  The right lower extremity was then prepped with chlorhexidine scrub and then DuraPrep from the tourniquet to the tips of the toes.  Sterile draping was performed.  With the extremity still elevated, it was Esmarch exsanguinated with a proximal tourniquet at 350 mmHg.  Skin incision was outlined in the medial longitudinal plane, centered about the old patella.  The patient has had a prior patellectomy many years ago with a transverse  incision.  Incision was carried down the subcutaneous tissue through abundant adipose tissue.  Medial parapatellar incision was then made through the deep capsule with the Bovie.  Joint was entered, there was a clear yellow joint effusion approximately 15 mL in volume.  I was able to evert the extensor mechanism laterally and then, flexed the knee to 90 degrees.  There were large areas of complete cartilage loss along the femur particularly in the medial half and as well as the tibial plateau with large osteophytes.  These were removed with a rongeur and I measured a standard femoral component.  Technically, the procedure was difficult because of the size of the patient's knee.  We were able to retract the soft tissue and initially performed an osteotomy in the proximal tibia transversely using the external tibial guide with 3 degrees angle declination.  With each bony cut on the tibia and femur, we used the external tibial guide to be sure we had appropriate alignment.  Subsequent cuts were then made on the femur using the standard femoral jig.  A 4-degree distal femoral valgus cut was utilized.  MCL and LCL remained intact.  A 10-mm flexion and extension gaps were perfectly intact.  Laminar spreaders were placed in the medial and lateral compartment to remove medial and lateral menisci as well as ACL and PCL.  Large osteophytes  removed from the posterior femoral condyle medially and laterally with a three-quarter inch curved osteotome.  There was a very large fabella laterally which was debrided.  Again, we checked flexion-extension gaps symmetrically at 10 mm.  Final cuts were then made on the femur for tapering purposes and to obtain the center holes.  Retractors were then placed about the tibia, we measured it 2.5.  Center hole was then made and then we removed the standard tray to insert the MBT revision tray with a longer stem.  This was pinned in place.  The keeled cut  was then made.  With the tibial tray in place, a 10-mm bridging bearing was applied followed by the standard femoral component.  The entire joint was reduced and through a full range of motion, we had full extension, flexion over 100 degrees, no instability with varus or valgus stress.  The trial components were removed.  We then copiously irrigated the joint with saline solution and removed any further synovitis.  The final components were then impacted with polymethyl methacrylate without antibiotics.  We initially inserted the tibia followed by the 10- mm bridging bearing and then the femur.  The knee was placed in full extension.  We had nice position of the components, any extraneous methacrylate was removed with the Freer from around the periphery of the components.  At approximately 16 minutes, the methacrylate had matured, during which time, we injected the deep capsule with 0.25% Marcaine with epinephrine. At 81 minutes, the tourniquet was deflated.  Gross bleeders were Bovie coagulated.  We had nice capillary refill to the joint surfaces.  Medium- size Hemovac was then placed to the lateral compartment.  We again irrigated the joint.  The deep capsule and extensor mechanism closed with 0 Ethibond suture.  Subcu was closed in several layers with 0 Vicryl, 3-0 Monocryl, skin closed with skin clips.  Sterile bulky dressing was applied followed by the patient's support stocking.  The patient tolerated the procedure without complications.     Vonna Kotyk. Durward Fortes, M.D.     PWW/MEDQ  D:  10/15/2013  T:  10/15/2013  Job:  [id number]

## 2013-10-15 NOTE — Anesthesia Preprocedure Evaluation (Addendum)
Anesthesia Evaluation  Patient identified by MRN, date of birth, ID band Patient awake    Reviewed: Allergy & Precautions, H&P , NPO status , Patient's Chart, lab work & pertinent test results  History of Anesthesia Complications (+) PONV and history of anesthetic complications  Airway Mallampati: III TM Distance: >3 FB Neck ROM: Full    Dental  (+) Teeth Intact   Pulmonary neg pulmonary ROS,  breath sounds clear to auscultation        Cardiovascular hypertension, Pt. on home beta blockers + dysrhythmias Supra Ventricular Tachycardia Rhythm:Regular     Neuro/Psych PSYCHIATRIC DISORDERS Anxiety Depression negative neurological ROS     GI/Hepatic Neg liver ROS, hiatal hernia, GERD-  Medicated and Controlled,  Endo/Other  Hypothyroidism Morbid obesity  Renal/GU negative Renal ROS     Musculoskeletal  (+) Arthritis -,   Abdominal   Peds  Hematology   Anesthesia Other Findings   Reproductive/Obstetrics                          Anesthesia Physical Anesthesia Plan  ASA: III  Anesthesia Plan: MAC, Spinal and Regional   Post-op Pain Management:    Induction:   Airway Management Planned: Natural Airway and Simple Face Mask  Additional Equipment:   Intra-op Plan:   Post-operative Plan: Extubation in OR  Informed Consent: I have reviewed the patients History and Physical, chart, labs and discussed the procedure including the risks, benefits and alternatives for the proposed anesthesia with the patient or authorized representative who has indicated his/her understanding and acceptance.   Dental advisory given  Plan Discussed with: CRNA and Surgeon  Anesthesia Plan Comments:        Anesthesia Quick Evaluation

## 2013-10-16 ENCOUNTER — Encounter (HOSPITAL_COMMUNITY): Payer: Self-pay | Admitting: Orthopaedic Surgery

## 2013-10-16 LAB — CBC
HCT: 34.3 % — ABNORMAL LOW (ref 36.0–46.0)
Hemoglobin: 11.3 g/dL — ABNORMAL LOW (ref 12.0–15.0)
MCH: 31.7 pg (ref 26.0–34.0)
MCHC: 32.9 g/dL (ref 30.0–36.0)
MCV: 96.1 fL (ref 78.0–100.0)
Platelets: 241 10*3/uL (ref 150–400)
RBC: 3.57 MIL/uL — ABNORMAL LOW (ref 3.87–5.11)
RDW: 13.4 % (ref 11.5–15.5)
WBC: 8.2 10*3/uL (ref 4.0–10.5)

## 2013-10-16 LAB — URINE CULTURE
Colony Count: NO GROWTH
Culture: NO GROWTH

## 2013-10-16 LAB — BASIC METABOLIC PANEL
Anion gap: 12 (ref 5–15)
BUN: 12 mg/dL (ref 6–23)
CO2: 26 mEq/L (ref 19–32)
Calcium: 8.8 mg/dL (ref 8.4–10.5)
Chloride: 104 mEq/L (ref 96–112)
Creatinine, Ser: 0.89 mg/dL (ref 0.50–1.10)
GFR calc Af Amer: 81 mL/min — ABNORMAL LOW (ref >90)
GFR calc non Af Amer: 70 mL/min — ABNORMAL LOW (ref >90)
Glucose, Bld: 116 mg/dL — ABNORMAL HIGH (ref 70–99)
Potassium: 4.2 mEq/L (ref 3.7–5.3)
Sodium: 142 mEq/L (ref 137–147)

## 2013-10-16 MED ORDER — INFLUENZA VAC SPLIT QUAD 0.5 ML IM SUSY
0.5000 mL | PREFILLED_SYRINGE | INTRAMUSCULAR | Status: AC
  Administered 2013-10-17: 0.5 mL via INTRAMUSCULAR
  Filled 2013-10-16: qty 0.5

## 2013-10-16 NOTE — Evaluation (Signed)
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   Clinical Impression   Pt admitted with the above diagnoses and presents with below problem list. Pt will benefit from continued acute OT to address the below listed deficits and maximize independence with basic ADLs prior to d/c home with 24 hour supervision/assistance from family. PTA pt was mod I with ADLs. Pt currently at min-mod A level for LB ADLs. ADL and PWB education provided. Pt fearful with mobility particularly putting extra compensatory weight through LLE to maintain 50% WB on RLE. Pt indicated she felt she was putting less than 50% WB through RLE, except during sidestepping. Min A initially during ambulation to advance RLE.     Follow Up Recommendations  Supervision/Assistance - 24 hour;No OT follow up    Equipment Recommendations  None recommended by OT;Other (comment) (pt has recommended equipment)    Recommendations for Other Services       Precautions / Restrictions Precautions Precautions: Knee Precaution Booklet Issued: No Precaution Comments: reviewed precautions Restrictions Weight Bearing Restrictions: Yes RLE Weight Bearing: Partial weight bearing RLE Partial Weight Bearing Percentage or Pounds: 50%      Mobility Bed Mobility               General bed mobility comments: pt in recliner. Per pt report transfer from bed to chair this morning required extra time due to fear of ability of nonoperative leg to manage additional weight from [facility] status of RLE.  Transfers Overall transfer level: Needs assistance Equipment used: Rolling walker (2 wheeled) Transfers: Sit to/from Stand Sit to Stand: Min assist         General transfer comment: min A to  power up    Balance Overall balance assessment: Needs assistance Sitting-balance support: No upper extremity supported;Feet supported Sitting balance-Leahy Scale: Fair     Standing balance support: Bilateral upper extremity supported;During functional activity Standing balance-Leahy Scale: Poor Standing balance comment: needs rw to maintain balance                            ADL Overall ADL's : Needs assistance/impaired Eating/Feeding: Set up;Sitting   Grooming: Set up;Sitting   Upper Body Bathing: Set up;Sitting   Lower Body Bathing: With adaptive equipment;Sit to/from stand;Moderate assistance   Upper Body Dressing : Set up;Sitting   Lower Body Dressing: Moderate assistance;With adaptive equipment;Sit to/from stand   Toilet Transfer: Minimal assistance;Ambulation;BSC;RW   Toileting- Clothing Manipulation and Hygiene: Minimal assistance;Sit to/from stand   Tub/ Shower Transfer: Minimal assistance;Ambulation;3 in 1;Rolling walker   Functional mobility during ADLs: Minimal assistance;Rolling walker General ADL Comments: Educated pt and spouse on techniques for ADL completion with knee precautions and 50% PWB status. Pt familiar with LB ADL techniques from previous knee surgeries.      Vision                     Perception     Praxis      Pertinent Vitals/Pain Pain Assessment: 0-10 Pain Score: 7  Pain Location: R knee Pain Descriptors / Indicators: Aching Pain Intervention(s): Limited activity within patient's tolerance;Monitored during session;Repositioned;Utilized relaxation techniques;Premedicated before session     Hand Dominance     Extremity/Trunk Assessment  Upper Extremity Assessment Upper Extremity Assessment: Overall WFL for tasks assessed   Lower Extremity Assessment Lower Extremity Assessment: Defer to PT evaluation       Communication Communication Communication: No difficulties   Cognition Arousal/Alertness:  Awake/alert Behavior During Therapy: WFL for tasks assessed/performed Overall Cognitive Status: Within Functional Limits for tasks assessed                     General Comments       Exercises       Shoulder Instructions      Home Living Family/patient expects to be discharged to:: Private residence Living Arrangements: Spouse/significant other Available Help at Discharge: Available 24 hours/day;Family Type of Home: House Home Access: Level entry     Home Layout: One level     Bathroom Shower/Tub: Teacher, early years/pre: Standard Bathroom Accessibility: Yes How Accessible: Accessible via walker Home Equipment: Bedside commode;Toilet riser;Tub bench;Hand held shower head;Walker - 2 wheels;Adaptive equipment (cane) Adaptive Equipment: Reacher        Prior Functioning/Environment Level of Independence: Independent with assistive device(s)        Comments: ambulated with cane    OT Diagnosis: Acute pain   OT Problem List: Impaired balance (sitting and/or standing);Decreased knowledge of use of DME or AE;Decreased knowledge of precautions;Pain;Obesity   OT Treatment/Interventions: Self-care/ADL training;DME and/or AE instruction;Patient/family education;Balance training;Therapeutic activities    OT Goals(Current goals can be found in the care plan section) Acute Rehab OT Goals Patient Stated Goal: not stated OT Goal Formulation: With patient/family Time For Goal Achievement: 10/23/13 Potential to Achieve Goals: Good ADL Goals Pt Will Perform Lower Body Bathing: with supervision;with adaptive equipment;sit to/from stand Pt Will Perform Lower Body Dressing: with supervision;with adaptive equipment;sit to/from stand Pt Will Transfer to Toilet: with supervision;ambulating (3n1over toilet) Pt Will Perform Toileting - Clothing Manipulation and hygiene: with supervision;sit to/from stand Pt Will Perform Tub/Shower Transfer: with supervision;tub  bench;rolling walker Additional ADL Goal #1: Pt will perform EOB<>supine at supervision level to prepare for OOB ADLs.   OT Frequency: Min 3X/week   Barriers to D/C:            Co-evaluation              End of Session Equipment Utilized During Treatment: Gait belt;Rolling walker  Activity Tolerance: Patient tolerated treatment well;Patient limited by pain Patient left: in chair;with call bell/phone within reach;with family/visitor present   Time: 1135-1153 OT Time Calculation (min): 18 min Charges:  OT General Charges $OT Visit: 1 Procedure OT Evaluation $Initial OT Evaluation Tier I: 1 Procedure OT Treatments $Self Care/Home Management : 8-22 mins G-Codes:    Hortencia Pilar 11/07/13, 1:22 PM

## 2013-10-16 NOTE — Progress Notes (Signed)
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    PT Comments    Pt mobilizing much better this session. Pt at min guard and still requiring max encouragement due to anxiety. Pt hopeful to D/C home tomorrow. Will follow per POC.  Follow Up Recommendations  Home health PT;Supervision/Assistance - 24 hour     Equipment Recommendations  None recommended by PT    Recommendations for Other Services OT consult     Precautions / Restrictions Precautions Precautions: Knee Precaution Booklet Issued: No Precaution Comments: reinforced WB precautions and no pillow under knee Restrictions Weight Bearing Restrictions: Yes RLE Weight Bearing: Partial weight bearing RLE Partial Weight Bearing Percentage or Pounds: 50%    Mobility  Bed Mobility Overal bed mobility: Needs Assistance Bed Mobility: Sit to Supine       Sit to supine: Min assist   General bed mobility comments: pt sitting on EOB upon arrival  Transfers Overall transfer level: Needs assistance Equipment used: Rolling walker (2 wheeled) Transfers: Sit to/from Stand Sit to Stand: Min guard;From elevated surface         General transfer comment: pt much improved transfers this session; min guard to steady and cues for hand placement  Ambulation/Gait Ambulation/Gait assistance: Min guard Ambulation Distance (Feet): 120 Feet Assistive device: Rolling walker (2 wheeled) Gait Pattern/deviations: Decreased stance time - right;Decreased step length - left;Antalgic;Decreased stride length;Narrow base of support Gait velocity: guarded due to pain Gait velocity interpretation: Below normal speed for age/gender General Gait Details: cues for proper gt sequencing and min cues  for PWB status; pt with 2 instances of Rt knee buckling due to incr WB; min guard to steady; 2 standing rest breaks due to fatigue    Stairs            Wheelchair Mobility    Modified Rankin (Stroke Patients Only)       Balance Overall balance assessment: Needs assistance Sitting-balance support: Feet supported;No upper extremity supported Sitting balance-Leahy Scale: Good     Standing balance support: During functional activity;Bilateral upper extremity supported Standing balance-Leahy Scale: Poor Standing balance comment: relies on RW                     Cognition Arousal/Alertness: Awake/alert Behavior During Therapy: Anxious Overall Cognitive Status: Within Functional Limits for tasks assessed                      Exercises Total Joint Exercises Ankle Circles/Pumps: AROM;Both;10 reps;Seated Quad Sets: AROM;Strengthening;Right;Both;10 reps Hip ABduction/ADduction: AAROM;Right;Strengthening;10 reps;Seated Long Arc Quad: AAROM;Strengthening;Right;10 reps;Seated Knee Flexion: Right;10 reps;Seated;AAROM Goniometric ROM: Rt knee AAROM in sitting 50 degrees    General Comments General comments (skin integrity, edema, etc.): pt less anxious this session with cont max encouragement      Pertinent Vitals/Pain Pain Assessment: 0-10 Pain Score: 8  Pain Location: Rt knee Pain Descriptors / Indicators: Aching;Sore Pain Intervention(s): Monitored during session;Premedicated before session;Repositioned    Home Living Family/patient expects to be discharged to:: Private residence Living Arrangements: Spouse/significant other Available Help at Discharge: Available 24 hours/day;Family Type of Home: House Home Access: Level entry   Home Layout: One level Home Equipment: Bedside commode;Toilet riser;Tub bench;Hand held shower head;Walker - 2 wheels;Adaptive equipment Additional Comments: pt  with multiple knee surgeries     Prior Function Level of  Independence: Independent with assistive device(s)      Comments: ambulated with cane   PT Goals (current goals can now be found in the care plan section) Acute Rehab PT Goals Patient Stated Goal: to go home tomorrow with my husband PT Goal Formulation: With patient/family Time For Goal Achievement: 10/23/13 Potential to Achieve Goals: Good Progress towards PT goals: Progressing toward goals    Frequency  7X/week    PT Plan Current plan remains appropriate    Co-evaluation             End of Session Equipment Utilized During Treatment: Gait belt Activity Tolerance: Patient tolerated treatment well Patient left: in chair;with call bell/phone within reach;with family/visitor present;with nursing/sitter in room     Time: 0768-0881 PT Time Calculation (min): 24 min  Charges:  $Gait Training: 8-22 mins $Therapeutic Exercise: 8-22 mins                    G CodesGustavus Bryant , [place]  [phone]  10/16/2013, 4:54 PM

## 2013-10-16 NOTE — Progress Notes (Signed)
Patient ID: [name], female   DOB: 1955/03/18, 58 y.o.   MRN: [id number] PATIENT ID: [name]        MRN:  [id number]          DOB/AGE: 03-Oct-1955 / 58 y.o.    [name], MD   Biagio Borg, PA-C [address] [place], [place]  [address]                             [phone]   PROGRESS NOTE  Subjective:  negative for Chest Pain  negative for Shortness of Breath  negative for Nausea/Vomiting   negative for Calf Pain    Tolerating Diet: yes         Patient reports pain as moderate.     Comfortable this am but tough night with pain  Objective: Vital signs in last 24 hours:   Patient Vitals for the past 24 hrs:  BP Temp Temp src Pulse Resp SpO2 Weight  10/16/13 0503 113/52 mmHg 98.3 F (36.8 C) Oral 72 19 100 % -  10/15/13 2332 110/51 mmHg 97.8 F (36.6 C) Oral 71 17 93 % -  10/15/13 2007 110/64 mmHg 98.4 F (36.9 C) Oral 72 16 93 % -  10/15/13 1947 - - - - 16 95 % -  10/15/13 1623 115/72 mmHg 97.6 F (36.4 C) - 62 14 98 % -  10/15/13 1600 124/71 mmHg 97.5 F (36.4 C) - 69 15 99 % -  10/15/13 1545 127/66 mmHg - - 65 12 99 % -  10/15/13 1530 132/73 mmHg - - 67 15 99 % -  10/15/13 1523 - - - 72 17 99 % -  10/15/13 1517 - 98.4 F (36.9 C) - - - - -  10/15/13 1515 129/68 mmHg - - 77 16 94 % -  10/15/13 1225 - - - 81 20 99 % -  10/15/13 1220 153/65 mmHg - - 81 21 98 % -  10/15/13 1215 - - - 80 23 100 % -  10/15/13 1210 159/65 mmHg - - 75 17 100 % -  10/15/13 1205 - - - 73 12 99 % -  10/15/13 0940 138/69 mmHg 98.4 F (36.9 C) Oral 81 18 97 % 126.554 kg (279 lb)      Intake/Output from previous day:   10/13 0701 - 10/14 0700 In: 2890 [P.O.:290; I.V.:2600] Out: 1810 [Urine:1350; Drains:460]   Intake/Output this shift:   10/14 0701 - 10/14 1900 In: -  Out: 200 [Urine:200]   Intake/Output     10/13 0701 - 10/14 0700 10/14 0701 - 10/15 0700   P.O. 290    I.V. (mL/kg) 2600 (20.5)    Total Intake(mL/kg) 2890 (22.8)    Urine (mL/kg/hr) 1350 200  (1.3)   Drains 460    Total Output 1810 200   Net +1080 -200           LABORATORY DATA:  Recent Labs  10/16/13 0500  WBC 8.2  HGB 11.3*  HCT 34.3*  PLT 241    Recent Labs  10/16/13 0500  NA 142  K 4.2  CL 104  CO2 26  BUN 12  CREATININE 0.89  GLUCOSE 116*  CALCIUM 8.8   Lab Results  Component Value Date   INR 0.98 10/08/2013   INR 1.06 11/01/2011    Recent Radiographic Studies :  Chest 2 View  10/08/2013   CLINICAL DATA:  Preop for  right knee replacement  EXAM: CHEST  2 VIEW  COMPARISON:  Chest x-ray of 10/20/2011  FINDINGS: No focal infiltrate or effusion is seen. Mediastinal and hilar contours are unchanged. The heart is borderline enlarged. No acute bony abnormality is seen.  IMPRESSION: Stable borderline cardiomegaly.  No active lung disease.   Electronically Signed   By: Ivar Drape M.D.   On: 10/08/2013 14:30   Ct Maxillofacial Wo Cm  10/08/2013   CLINICAL DATA:  Initial evaluation for sinusitis for 4 weeks, minimal relief with 2 courses of antibiotics, pain bilateral frontal and maxillary areas  EXAM: CT MAXILLOFACIAL WITHOUT CONTRAST  TECHNIQUE: Multidetector CT imaging of the maxillofacial structures was performed. Multiplanar CT image reconstructions were also generated. A small metallic BB was placed on the right temple in order to reliably differentiate right from left.  COMPARISON:  None.  FINDINGS: The frontal sinuses are clear. Ethmoid air cells and sphenoid sinuses are clear. There is minimal mucosal periosteal thickening of the left maxillary sinus. The right maxillary sinus is clear. Ostiomeatal complexes are patent bilaterally. There is moderate deviation of the nasal septum to for the left side.  IMPRESSION: Minimal inflammatory change of the left maxillary sinus.   Electronically Signed   By: Skipper Cliche M.D.   On: 10/08/2013 17:40     Examination:  General appearance: alert, cooperative, no distress and morbidly obese  Wound Exam: clean, dry,  intact   Drainage:  Moderate amount Serosanguinous exudate in hemovac  Motor Exam: EHL, FHL, Anterior Tibial and Posterior Tibial Intact  Sensory Exam: Superficial Peroneal, Deep Peroneal and Tibial normal  Vascular Exam: Normal  Assessment:    1 Day Post-Op  Procedure(s) (LRB): RIGHT TOTAL KNEE ARTHROPLASTY (Right)  ADDITIONAL DIAGNOSIS:  Active Problems:   S/P total knee replacement using cement  no new problems   Plan: Physical Therapy as ordered Partial Weight Bearing @ 50% (PWB)  DVT Prophylaxis:  Xarelto, Foot Pumps and TED hose  DISCHARGE PLAN: Home  DISCHARGE NEEDS: HHRN, CPM, Walker and 3-in-1 comode seat   DC hemovac in am, OOB with PT, foley out     [name] W  10/16/2013 8:13 AM

## 2013-10-16 NOTE — Discharge Instructions (Signed)
Information on my medicine - XARELTO (Rivaroxaban)  This medication education was reviewed with me or my healthcare representative as part of my discharge preparation.   Why was Xarelto prescribed for you? Xarelto was prescribed for you to reduce the risk of blood clots forming after orthopedic surgery. The medical term for these abnormal blood clots is venous thromboembolism (VTE).  What do you need to know about xarelto ? Take your Xarelto 10 mg ONCE DAILY at the same time every day. You may take it either with or without food.  If you have difficulty swallowing the tablet whole, you may crush it and mix in applesauce just prior to taking your dose.  Take Xarelto exactly as prescribed by your doctor and DO NOT stop taking Xarelto without talking to the doctor who prescribed the medication.  Stopping without other VTE prevention medication to take the place of Xarelto may increase your risk of developing a clot.  After discharge, you should have regular check-up appointments with your healthcare provider that is prescribing your Xarelto.    What do you do if you miss a dose? If you miss a dose, take it as soon as you remember on the same day then continue your regularly scheduled once daily regimen the next day. Do not take two doses of Xarelto on the same day.   Important Safety Information A possible side effect of Xarelto is bleeding. You should call your healthcare provider right away if you experience any of the following:   Bleeding from an injury or your nose that does not stop.   Unusual colored urine (red or dark brown) or unusual colored stools (red or black).   Unusual bruising for unknown reasons.   A serious fall or if you hit your head (even if there is no bleeding).  Some medicines may interact with Xarelto and might increase your risk of bleeding while on Xarelto. To help avoid this, consult your healthcare provider or pharmacist prior to using any new  prescription or non-prescription medications, including herbals, vitamins, non-steroidal anti-inflammatory drugs (NSAIDs) and supplements.  This website has more information on Xarelto: [email or web address]

## 2013-10-16 NOTE — Evaluation (Signed)
Physical Therapy Evaluation Patient Details Name: [name] MRN: [id number] DOB: 1955/12/28 Today's Date: 10/16/2013   History of Present Illness  s/p right TKA; h/o 18 left knee surgeries and 3 right knee surgeries; left total knee arthroplasty in 2006 and revision in 2013, 2 foot surgeries.  Clinical Impression  Pt is s/p Rt TKA resulting in the deficits listed below (see PT Problem List).  Pt will benefit from skilled PT to increase their independence and safety with mobility to allow discharge to the venue listed below. Pt very anxious and requires max encouragement for mobility. Believe pt will progress well enough to D/C home with husband.      Follow Up Recommendations Home health PT;Supervision/Assistance - 24 hour    Equipment Recommendations  None recommended by PT    Recommendations for Other Services OT consult     Precautions / Restrictions Precautions Precautions: Knee Precaution Booklet Issued: No Precaution Comments: reinforced no pillow under knee Restrictions Weight Bearing Restrictions: Yes RLE Weight Bearing: Partial weight bearing RLE Partial Weight Bearing Percentage or Pounds: 50%      Mobility  Bed Mobility Overal bed mobility: Needs Assistance Bed Mobility: Sit to Supine       Sit to supine: Min assist   General bed mobility comments: (A) to advance Rt LE into supine position; incr time and cues due to pain   Transfers Overall transfer level: Needs assistance Equipment used: Rolling walker (2 wheeled) Transfers: Sit to/from Stand Sit to Stand: Mod assist;From elevated surface         General transfer comment: incr difficulty powering up from lower surface bed;  bed elevated and cues for anterior rocking technique to shift weight; and mod (A) to power up with cues for hand placement   Ambulation/Gait Ambulation/Gait assistance: Min assist Ambulation Distance (Feet): 40 Feet Assistive device: Rolling walker (2 wheeled) Gait  Pattern/deviations: Decreased stance time - right;Decreased step length - left;Antalgic;Narrow base of support;Trunk flexed Gait velocity: guarded due to pain Gait velocity interpretation: Below normal speed for age/gender General Gait Details: cues for upright posture and gt sequencing; pt very anxious and requires max encouragement; min (A) to manage RW and maintain balance   Stairs            Wheelchair Mobility    Modified Rankin (Stroke Patients Only)       Balance Overall balance assessment: Needs assistance Sitting-balance support: Feet supported;No upper extremity supported Sitting balance-Leahy Scale: Fair     Standing balance support: During functional activity;Bilateral upper extremity supported Standing balance-Leahy Scale: Zero Standing balance comment: relies heavily on RW and min (A) to maintain balance                              Pertinent Vitals/Pain Pain Assessment: 0-10 Pain Score: 4  Pain Location: Rt knee Pain Descriptors / Indicators: Sore Pain Intervention(s): Monitored during session;Limited activity within patient's tolerance;Patient requesting pain meds-RN notified    Home Living Family/patient expects to be discharged to:: Private residence Living Arrangements: Spouse/significant other Available Help at Discharge: Available 24 hours/day;Family Type of Home: House Home Access: Level entry     Home Layout: One level Home Equipment: Bedside commode;Toilet riser;Tub bench;Hand held shower head;Walker - 2 wheels;Adaptive equipment Additional Comments: pt with multiple knee surgeries     Prior Function Level of Independence: Independent with assistive device(s)         Comments: ambulated with cane  Hand Dominance   Dominant Hand: Right    Extremity/Trunk Assessment   Upper Extremity Assessment: Defer to OT evaluation           Lower Extremity Assessment: RLE deficits/detail RLE Deficits / Details: knee AROM  in sitting 40 degrees limited by pain    Cervical / Trunk Assessment: Normal  Communication   Communication: No difficulties  Cognition Arousal/Alertness: Awake/alert Behavior During Therapy: Anxious Overall Cognitive Status: Within Functional Limits for tasks assessed                      General Comments General comments (skin integrity, edema, etc.): max encouragement; pt very anxious with mobility     Exercises Total Joint Exercises Ankle Circles/Pumps: AROM;Both;10 reps;Seated Long Arc Quad: AAROM;Strengthening;Right;10 reps;Seated Knee Flexion: AROM;Right;10 reps;Standing Goniometric ROM: see Rt LE assessment       Assessment/Plan    PT Assessment Patient needs continued PT services  PT Diagnosis Abnormality of gait;Generalized weakness;Acute pain   PT Problem List Decreased strength;Decreased range of motion;Decreased activity tolerance;Decreased balance;Decreased mobility;Decreased knowledge of use of DME;Decreased safety awareness;Pain;Impaired sensation;Decreased knowledge of precautions  PT Treatment Interventions DME instruction;Gait training;Functional mobility training;Therapeutic activities;Therapeutic exercise;Balance training;Neuromuscular re-education;Patient/family education   PT Goals (Current goals can be found in the Care Plan section) Acute Rehab PT Goals Patient Stated Goal: to go home tomorrow PT Goal Formulation: With patient/family Time For Goal Achievement: 10/23/13 Potential to Achieve Goals: Good    Frequency 7X/week   Barriers to discharge        Co-evaluation               End of Session Equipment Utilized During Treatment: Gait belt Activity Tolerance: Patient tolerated treatment well Patient left: in bed;with call bell/phone within reach;in CPM;with family/visitor present Nurse Communication: Patient requests pain meds;Precautions;Mobility status         Time: 4540-9811 PT Time Calculation (min): 21  min   Charges:   PT Evaluation $Initial PT Evaluation Tier I: 1 Procedure PT Treatments $Gait Training: 8-22 mins   PT G CodesGustavus Bryant, [place] 10/16/2013, 3:20 PM

## 2013-10-17 DIAGNOSIS — M1712 Unilateral primary osteoarthritis, left knee: Secondary | ICD-10-CM | POA: Diagnosis present

## 2013-10-17 DIAGNOSIS — M1711 Unilateral primary osteoarthritis, right knee: Secondary | ICD-10-CM | POA: Diagnosis not present

## 2013-10-17 LAB — BASIC METABOLIC PANEL
Anion gap: 10 (ref 5–15)
BUN: 11 mg/dL (ref 6–23)
CO2: 26 mEq/L (ref 19–32)
Calcium: 8.9 mg/dL (ref 8.4–10.5)
Chloride: 102 mEq/L (ref 96–112)
Creatinine, Ser: 0.9 mg/dL (ref 0.50–1.10)
GFR calc Af Amer: 80 mL/min — ABNORMAL LOW (ref >90)
GFR calc non Af Amer: 69 mL/min — ABNORMAL LOW (ref >90)
Glucose, Bld: 147 mg/dL — ABNORMAL HIGH (ref 70–99)
Potassium: 4.5 mEq/L (ref 3.7–5.3)
Sodium: 138 mEq/L (ref 137–147)

## 2013-10-17 LAB — CBC
HCT: 32.3 % — ABNORMAL LOW (ref 36.0–46.0)
Hemoglobin: 10.9 g/dL — ABNORMAL LOW (ref 12.0–15.0)
MCH: 32.2 pg (ref 26.0–34.0)
MCHC: 33.7 g/dL (ref 30.0–36.0)
MCV: 95.3 fL (ref 78.0–100.0)
Platelets: 232 10*3/uL (ref 150–400)
RBC: 3.39 MIL/uL — ABNORMAL LOW (ref 3.87–5.11)
RDW: 13.3 % (ref 11.5–15.5)
WBC: 10 10*3/uL (ref 4.0–10.5)

## 2013-10-17 MED ORDER — HYDROMORPHONE HCL 2 MG PO TABS: 2.0000 mg | ORAL_TABLET | ORAL | Status: DC | PRN

## 2013-10-17 MED ORDER — METHOCARBAMOL 500 MG PO TABS: 500.0000 mg | ORAL_TABLET | Freq: Four times a day (QID) | ORAL | Status: DC | PRN

## 2013-10-17 MED ORDER — RIVAROXABAN 10 MG PO TABS: 10.0000 mg | ORAL_TABLET | Freq: Every day | ORAL | Status: DC

## 2013-10-17 NOTE — Care Management Note (Signed)
CARE MANAGEMENT NOTE 10/17/2013  Patient:  [name], [name]   Account Number:  [id number]  Date Initiated:  10/16/2013  Documentation initiated by:  Ricki Miller  Subjective/Objective Assessment:   58 yr old female admitted with right knee osteoarthritis, underwent right total knee arthroplasty.     Action/Plan:   Patient preoperatively setup with [facility], no changes. Has family support at discharge.   Anticipated DC Date:  10/17/2013   Anticipated DC Plan:  [place]  CM consult      [facility] Choice  HOME HEALTH  DURABLE MEDICAL EQUIPMENT   Choice offered to / List presented to:     DME arranged  [facility]  3-N-1  CPM      DME agency  [employer]     [place] arranged  HH-2 PT      [place]   Status of service:  Completed, signed off Medicare Important Message given?  NA - LOS <3 / Initial given by admissions (If response is "NO", the following Medicare IM given date fields will be blank) Date Medicare IM given:   Medicare IM given by:   Date Additional Medicare IM given:   Additional Medicare IM given by:    Discharge Disposition:  [place]  Per UR Regulation:  Reviewed for med. necessity/level of care/duration of stay  If discussed at [place] of Stay Meetings, dates discussed:    Comments:

## 2013-10-17 NOTE — Discharge Summary (Signed)
[name], MD   Biagio Borg, PA-C [address], [place], [place]  [address]                             [phone]  PATIENT ID: [name]        MRN:  [id number]          DOB/AGE: [birth year] / 58 y.o.    DISCHARGE SUMMARY  ADMISSION DATE:    10/15/2013 DISCHARGE DATE:   10/17/2013   ADMISSION DIAGNOSIS: RIGHT KNEE OSTEOARTHRITIS    DISCHARGE DIAGNOSIS:  RIGHT KNEE OSTEOARTHRITIS    ADDITIONAL DIAGNOSIS: Principal Problem:   Osteoarthritis of left knee Active Problems:   S/P total knee replacement using cement  Past Medical History  Diagnosis Date  . PONV (postoperative nausea and vomiting)   . Dysrhythmia     rapid HR on occasion-takes Propranolol daily  . Hyperlipidemia     takes LIpitor daily  . Peripheral neuropathy   . Joint pain   . Joint swelling   . Chronic back pain     facet disease and bulging disc; "thoracic and lower back" (10/16/2013)  . Rosacea   . H/O hiatal hernia   . Constipation     r/t pain meds;takes stool softener daily  . History of bladder infections     > 71yr ago  . Anxiety     takes Paxil daily and Xanax prn  . Insomnia   . Hypothyroidism     takes Synthroid daily  . Muscle spasms of head or neck     lumbar and thoracic;takes Robaxin prn  . Seasonal allergies     takes allegra prn  . Kidney stones   . Anginal pain     "on and off" (11/01/2011) - anxiety related  . Depression   . Family history of anesthesia complication     ":PONV; mom and sisters"  . GERD (gastroesophageal reflux disease)     takes Prilosec   . Arthritis     "knees" (10/16/2013)    PROCEDURE: Procedure(s): RIGHT TOTAL KNEE ARTHROPLASTY  on 10/15/2013  CONSULTS: none     HISTORY: is a very pleasant 58 year old white female who is seen today for evaluation of her right knee. She is status post left total knee arthroplasty and subsequent revision. Apparently had a total knee replacement done by Dr. Telford Nab, and apparently it became  loose. This had been done in 2006, that of a left LCS performed. In 2013 she did have a revision performed on it. She did well with this overall. Still has pain but otherwise doing well. However, she has now gotten to the point where she is having constant pain and discomfort in the right knee. She has previously had a patellectomy performed. She has pain with every step and must use a cane now to be able to function. She has nighttime pain and cannot sleep. She is on chronic pain medicine with Dilaudid because of this pain. She is also using methocarbamol. She has fairly good motion, she states, but again it is quite painful. Especially prolonged sitting and walking cause her increasing pain and discomfort. She does have swelling of the right knee also associated with activity which is larger with activity and less without. She has had multiple cortisone injections and actually has just finished up a series of viscosupplementation on 05/01/2013. Again, symptoms are worsening in the right knee to the point where she cannot take  the pain any more. Her bad days outnumber her good.   HOSPITAL COURSE:  [name] is a 58 y.o. admitted on 10/15/2013 and found to have a diagnosis of RIGHT KNEE OSTEOARTHRITIS.  After appropriate laboratory studies were obtained  they were taken to the operating room on 10/15/2013 and underwent  Procedure(s): RIGHT TOTAL KNEE ARTHROPLASTY  .   They were given perioperative antibiotics:  Anti-infectives   Start     Dose/Rate Route Frequency Ordered Stop   10/15/13 1700  ceFAZolin (ANCEF) IVPB 2 g/50 mL premix     2 g 100 mL/hr over 30 Minutes Intravenous Every 6 hours 10/15/13 1651 10/16/13 0028   10/15/13 0600  ceFAZolin (ANCEF) 3 g in dextrose 5 % 50 mL IVPB     3 g 160 mL/hr over 30 Minutes Intravenous On call to O.R. 10/14/13 1423 10/15/13 1304    .  Tolerated the procedure well.  Placed with a foley intraoperatively.      POD #1, allowed out of bed to a chair.   PT for ambulation and exercise program.  Foley D/C'd in morning.  IV saline locked.  O2 discontionued.  POD #2, continued PT and ambulation.   Hemovac pulled. .  The remainder of the hospital course was dedicated to ambulation and strengthening.   The patient was discharged on 2 Days Post-Op in  Stable condition.  Blood products given:none  DIAGNOSTIC STUDIES: Recent vital signs: Patient Vitals for the past 24 hrs:  BP Temp Temp src Pulse Resp SpO2  10/17/13 1332 111/56 mmHg 98.1 F (36.7 C) Oral 92 18 98 %  10/17/13 0551 132/57 mmHg 98.8 F (37.1 C) Oral 82 18 95 %  10/16/13 2059 126/49 mmHg 99.8 F (37.7 C) Oral 86 17 94 %       Recent laboratory studies:  Recent Labs  10/16/13 0500 10/17/13 0515  WBC 8.2 10.0  HGB 11.3* 10.9*  HCT 34.3* 32.3*  PLT 241 232    Recent Labs  10/16/13 0500 10/17/13 0515  NA 142 138  K 4.2 4.5  CL 104 102  CO2 26 26  BUN 12 11  CREATININE 0.89 0.90  GLUCOSE 116* 147*  CALCIUM 8.8 8.9   Lab Results  Component Value Date   INR 0.98 10/08/2013   INR 1.06 11/01/2011     Recent Radiographic Studies :  Chest 2 View  10/08/2013   CLINICAL DATA:  Preop for right knee replacement  EXAM: CHEST  2 VIEW  COMPARISON:  Chest x-ray of 10/20/2011  FINDINGS: No focal infiltrate or effusion is seen. Mediastinal and hilar contours are unchanged. The heart is borderline enlarged. No acute bony abnormality is seen.  IMPRESSION: Stable borderline cardiomegaly.  No active lung disease.   Electronically Signed   By: Ivar Drape M.D.   On: 10/08/2013 14:30   Ct Maxillofacial Wo Cm  10/08/2013   CLINICAL DATA:  Initial evaluation for sinusitis for 4 weeks, minimal relief with 2 courses of antibiotics, pain bilateral frontal and maxillary areas  EXAM: CT MAXILLOFACIAL WITHOUT CONTRAST  TECHNIQUE: Multidetector CT imaging of the maxillofacial structures was performed. Multiplanar CT image reconstructions were also generated. A small metallic BB was placed on  the right temple in order to reliably differentiate right from left.  COMPARISON:  None.  FINDINGS: The frontal sinuses are clear. Ethmoid air cells and sphenoid sinuses are clear. There is minimal mucosal periosteal thickening of the left maxillary sinus. The right maxillary sinus is clear. Ostiomeatal  complexes are patent bilaterally. There is moderate deviation of the nasal septum to for the left side.  IMPRESSION: Minimal inflammatory change of the left maxillary sinus.   Electronically Signed   By: Skipper Cliche M.D.   On: 10/08/2013 17:40    DISCHARGE INSTRUCTIONS: Discharge Instructions   CPM    Complete by:  As directed   Continuous passive motion machine (CPM):      Use the CPM from 0 to 60 for 6-8 hours per day.      You may increase by 5-10 per day.  You may break it up into 2 or 3 sessions per day.      Use CPM for 3-4 weeks or until you are told to stop.     Call MD / Call 911    Complete by:  As directed   If you experience chest pain or shortness of breath, CALL 911 and be transported to the hospital emergency room.  If you develop a fever above 101 F, pus (white drainage) or increased drainage or redness at the wound, or calf pain, call your surgeon's office.     Change dressing    Complete by:  As directed   DO NOT CHANGE DRESSING     Constipation Prevention    Complete by:  As directed   Drink plenty of fluids.  Prune juice and/or coffee may be helpful.  You may use a stool softener, such as Colace (over the counter) 100 mg twice a day.  Use MiraLax (over the counter) for constipation as needed but this may take several days to work.  Mag Citrate --OR-- Milk of Magnesia --OR -- Dulcolax pills/suppositories may also be used but follow directions on the label.     Diet - low sodium heart healthy    Complete by:  As directed      Diet general    Complete by:  As directed      Discharge instructions    Complete by:  As directed   YOU WERE GIVEN A DEVICE CALLED AN INCENTIVE  SPIROMETER TO HELP YOU TAKE DEEP BREATHS.  PLEASE USE THIS AT LEAST TEN (10) TIMES EVERY 1-2 HOURS EVERY DAY TO PREVENT PNEUMONIA.     Do not put a pillow under the knee. Place it under the heel.    Complete by:  As directed      Driving restrictions    Complete by:  As directed   No driving for 6 weeks     Increase activity slowly as tolerated    Complete by:  As directed      Lifting restrictions    Complete by:  As directed   No lifting for 6 weeks     Partial weight bearing    Complete by:  As directed   50 % WEIGHT BEARING AS TAUGHT IN PHYSICAL THERAPY     Patient may shower    Complete by:  As directed   You may shower over the brown dressing.  Once the dressing is removed you may shower without a dressing once there is no drainage.  Do not wash over the wound.  If drainage remains, cover wound with plastic wrap and then shower.     TED hose    Complete by:  As directed   Use stockings (TED hose) for 2 weeks on operative leg(s).  You may remove them at night for sleeping.           DISCHARGE MEDICATIONS:  Medication List    STOP taking these medications       aspirin 81 MG tablet      TAKE these medications       ALPRAZolam 0.5 MG tablet  Commonly known as:  XANAX  Take 0.25 mg by mouth 3 (three) times daily as needed for anxiety.     atorvastatin 10 MG tablet  Commonly known as:  LIPITOR  Take 10 mg by mouth Daily.     azelastine 0.1 % nasal spray  Commonly known as:  ASTELIN  Place 2 sprays into both nostrils 2 (two) times daily. Use in each nostril as directed     cetirizine 10 MG tablet  Commonly known as:  ZYRTEC  Take 10 mg by mouth daily.     fexofenadine 180 MG tablet  Commonly known as:  ALLEGRA  Take 180 mg by mouth daily as needed. For allergies     fluticasone 50 MCG/ACT nasal spray  Commonly known as:  FLONASE  Place 1 spray into both nostrils daily as needed for allergies or rhinitis.     hydrochlorothiazide 12.5 MG capsule  Commonly  known as:  MICROZIDE  Take 12.5 mg by mouth daily as needed.     HYDROmorphone 2 MG tablet  Commonly known as:  DILAUDID  Take 1-2 tablets (2-4 mg total) by mouth every 4 (four) hours as needed for severe pain.     levothyroxine 150 MCG tablet  Commonly known as:  SYNTHROID, LEVOTHROID  Take 150 mcg by mouth daily before breakfast.     lidocaine 5 %  Commonly known as:  LIDODERM  Place 1 patch onto the skin every 12 (twelve) hours as needed (knee, back). Remove & Discard patch within 12 hours or as directed by MD     methocarbamol 500 MG tablet  Commonly known as:  ROBAXIN  Take 1 tablet (500 mg total) by mouth every 6 (six) hours as needed for muscle spasms.     montelukast 10 MG tablet  Commonly known as:  SINGULAIR  Take 10 mg by mouth at bedtime.     multivitamin tablet  Take 1 tablet by mouth daily.     omeprazole 40 MG capsule  Commonly known as:  PRILOSEC  Take 40 mg by mouth 2 (two) times daily.     PARoxetine 25 MG 24 hr tablet  Commonly known as:  PAXIL-CR  Take 25 mg by mouth daily. 37.5 mg paxil in the am and 25 mg at night.     propranolol ER 80 MG 24 hr capsule  Commonly known as:  INDERAL LA  Take 80 mg by mouth daily.     rivaroxaban 10 MG Tabs tablet  Commonly known as:  XARELTO  Take 1 tablet (10 mg total) by mouth daily.     vitamin C 1000 MG tablet  Take 1,000 mg by mouth daily.     Vitamin D-3 5000 UNITS Tabs  Take 5,000 Units by mouth daily.        FOLLOW UP VISIT:       Follow-up Information   Follow up with [facility], Vonna Kotyk, MD. Schedule an appointment as soon as possible for a visit on 10/28/2013.   Specialty:  Orthopedic Surgery   Contact information:   [address] [place] [address] [phone]       DISPOSITION:   Home  CONDITION:  Stable   Mike Craze. [place], [place] [phone]  10/17/2013 2:15 PM

## 2013-10-17 NOTE — Progress Notes (Signed)
Subjective: 2 Days Post-Op Procedure(s) (LRB): RIGHT TOTAL KNEE ARTHROPLASTY (Right) Patient reports pain as moderate  Objective: Vital signs in last 24 hours: Temp:  [98.1 F (36.7 C)-99.8 F (37.7 C)] 98.1 F (36.7 C) (10/15 1332) Pulse Rate:  [82-92] 92 (10/15 1332) Resp:  [17-18] 18 (10/15 1332) BP: (111-132)/(49-57) 111/56 mmHg (10/15 1332) SpO2:  [94 %-98 %] 98 % (10/15 1332)  Intake/Output from previous day: 10/14 0701 - 10/15 0700 In: 1080 [P.O.:1080] Out: 302 [Urine:202; Drains:100] Intake/Output this shift: Total I/O In: 520 [P.O.:520] Out: 3 [Urine:3]   Recent Labs  10/16/13 0500 10/17/13 0515  HGB 11.3* 10.9*    Recent Labs  10/16/13 0500 10/17/13 0515  WBC 8.2 10.0  RBC 3.57* 3.39*  HCT 34.3* 32.3*  PLT 241 232    Recent Labs  10/16/13 0500 10/17/13 0515  NA 142 138  K 4.2 4.5  CL 104 102  CO2 26 26  BUN 12 11  CREATININE 0.89 0.90  GLUCOSE 116* 147*  CALCIUM 8.8 8.9   No results found for this basename: LABPT, INR,  in the last 72 hours  Neurologically intact  Assessment/Plan: 2 Days Post-Op Procedure(s) (LRB): RIGHT TOTAL KNEE ARTHROPLASTY (Right) Discharge home with home health, hemovac removed, dressing changed, lab stable, will D/C to home  [facility], PETER W 10/17/2013, 2:11 PM

## 2013-10-17 NOTE — Progress Notes (Signed)
Occupational Therapy Treatment and Discharge Patient Details Name: [name] MRN: [id number] DOB: 1955-12-02 Today's Date: 10/17/2013    History of present illness s/p right TKA; h/o 18 left knee surgeries and 3 right knee surgeries; left total knee arthroplasty in 2006 and revision in 2013, 2 foot surgeries.   OT comments  This 58 yo female admitted with above presents to acute OT with all education completed and pt without any further questions about BADLs. Acute OT will sign off.  Follow Up Recommendations  Supervision/Assistance - 24 hour;No OT follow up    Equipment Recommendations  None recommended by OT       Precautions / Restrictions Precautions Precautions: Knee Restrictions Weight Bearing Restrictions: Yes RLE Weight Bearing: Partial weight bearing RLE Partial Weight Bearing Percentage or Pounds: 50%       Mobility Bed Mobility               General bed mobility comments: Pt up in recliner upon arrival with PT in gym  Transfers Overall transfer level: Needs assistance Equipment used: Rolling walker (2 wheeled) Transfers: Sit to/from Stand Sit to Stand: Min guard (with increased time today due to increased pain)                  ADL Overall ADL's : Needs assistance/impaired                                 Tub/ Shower Transfer: Minimal assistance;Ambulation;Rolling walker;Tub bench Tub/Shower Transfer Details (indicate cue type and reason): A for RLE and to hold RW for sit<>stand   General ADL Comments: Pt reports that husband will A her with LBADLs until she can do them for herself and he has a reacher that she can use as well. Pt is aware she is to keep her RLE clean and dry--recommended "Press n seal" cling wrap and taping at the top.                Cognition   Behavior During Therapy: WFL for tasks assessed/performed Overall Cognitive Status: Within Functional Limits for tasks assessed                                     Pertinent Vitals/ Pain       Pain Assessment: 0-10 Pain Score: 10-Worst pain ever Pain Location: right knee Pain Descriptors / Indicators: Aching;Sore Pain Intervention(s): Monitored during session;Repositioned;Patient requesting pain meds-RN notified            Progress Toward Goals  OT Goals(current goals can now be found in the care plan section)  Progress towards OT goals:  (All education completed)     Plan Discharge plan remains appropriate       End of Session Equipment Utilized During Treatment: Gait belt;Rolling walker   Activity Tolerance Patient limited by pain   Patient Left in chair;with call bell/phone within reach;with family/visitor present   Nurse Communication Patient requests pain meds        Time: 7564-3329 OT Time Calculation (min): 22 min  Charges: OT General Charges $OT Visit: 1 Procedure OT Treatments $Self Care/Home Management : 8-22 mins  Almon Register [phone] 10/17/2013, 8:48 AM

## 2013-10-17 NOTE — Progress Notes (Signed)
Physical Therapy Treatment Patient Details Name: [name] MRN: [id number] DOB: [birth year] Today's Date: 10/17/2013    History of Present Illness s/p right TKA; h/o 18 left knee surgeries and 3 right knee surgeries; left total knee arthroplasty in 2006 and revision in 2013, 2 foot surgeries.    PT Comments    Pt very slow to progress today secondary to pain. Reviewed all education regarding mobility and therapeutic exercise with pt. Pt hopeful to D/C home today.    Follow Up Recommendations  Home health PT;Supervision/Assistance - 24 hour     Equipment Recommendations  None recommended by PT    Recommendations for Other Services       Precautions / Restrictions Precautions Precautions: Knee Precaution Comments: given HEP; reinforced no pillow under knee  Restrictions Weight Bearing Restrictions: Yes RLE Weight Bearing: Partial weight bearing RLE Partial Weight Bearing Percentage or Pounds: 50%    Mobility  Bed Mobility Overal bed mobility: Needs Assistance Bed Mobility: Supine to Sit     Supine to sit: Min guard     General bed mobility comments: educated on using leg lift with sheet to advance Rt LE off EOB; bed flattened to simulate home; cues for technique   Transfers Overall transfer level: Needs assistance Equipment used: Rolling walker (2 wheeled) Transfers: Sit to/from Stand Sit to Stand: Min guard         General transfer comment: pt required incr time this session due to pain;   Ambulation/Gait Ambulation/Gait assistance: Supervision Ambulation Distance (Feet): 60 Feet Assistive device: Rolling walker (2 wheeled) Gait Pattern/deviations: Step-through pattern;Decreased stance time - right;Decreased step length - left;Antalgic;Trunk flexed Gait velocity: guarded due to pain Gait velocity interpretation: Below normal speed for age/gender General Gait Details: cues for upright posture and gt sequencing; pt limited due to pain today requiring  rest break to conserve energy for exercises and bath tub transfer   Stairs            Wheelchair Mobility    Modified Rankin (Stroke Patients Only)       Balance           Standing balance support: During functional activity;Bilateral upper extremity supported Standing balance-Leahy Scale: Poor Standing balance comment: relies heavily on RW for balance                     Cognition Arousal/Alertness: Awake/alert Behavior During Therapy: WFL for tasks assessed/performed Overall Cognitive Status: Within Functional Limits for tasks assessed                      Exercises Total Joint Exercises Ankle Circles/Pumps: AROM;Both;10 reps;Seated Quad Sets: AROM;Strengthening;Right;Both;10 reps Short Arc Quad: Other (comment) (unable to perform due to pain) Heel Slides: AAROM;Right;5 reps;Seated;Other (comment) (limited due to pain ) Hip ABduction/ADduction: AAROM;Right;Strengthening;10 reps;Seated Long Arc Quad: AAROM;Strengthening;Right;Seated;5 reps;Other (comment) (limited by pain) Goniometric ROM: knee flexion greatly limited by pain today; 40 degrees in sitting    General Comments General comments (skin integrity, edema, etc.): discussed car transfer technique with pt       Pertinent Vitals/Pain Pain Assessment: 0-10 Pain Score: 10-Worst pain ever Pain Location: Rt knee Pain Descriptors / Indicators: Constant;Sore;Spasm Pain Intervention(s): Monitored during session;Limited activity within patient's tolerance;Repositioned;Premedicated before session    Home Living                      Prior Function            PT  Goals (current goals can now be found in the care plan section) Acute Rehab PT Goals Patient Stated Goal: to go home today PT Goal Formulation: With patient/family Time For Goal Achievement: 10/23/13 Potential to Achieve Goals: Good Progress towards PT goals: Progressing toward goals    Frequency  7X/week    PT Plan  Current plan remains appropriate    Co-evaluation             End of Session Equipment Utilized During Treatment: Gait belt Activity Tolerance: Patient limited by pain Patient left: in chair;with call bell/phone within reach;Other (comment) (in ortho gym with OT)     Time: 2458-0998 PT Time Calculation (min): 24 min  Charges:  $Gait Training: 8-22 mins $Therapeutic Exercise: 8-22 mins                    G Codes:      Gustavus Bryant, [place]  [phone] 10/17/2013, 9:00 AM

## 2013-12-18 ENCOUNTER — Other Ambulatory Visit (HOSPITAL_COMMUNITY): Payer: Self-pay | Admitting: Orthopaedic Surgery

## 2013-12-18 ENCOUNTER — Ambulatory Visit (HOSPITAL_COMMUNITY)
Admission: RE | Admit: 2013-12-18 | Discharge: 2013-12-18 | Disposition: A | Discharge status: Home / Self Care | Payer: Medicare Other | Source: Ambulatory Visit | Attending: Orthopaedic Surgery | Admitting: Orthopaedic Surgery

## 2013-12-18 DIAGNOSIS — M79609 Pain in unspecified limb: Secondary | ICD-10-CM

## 2013-12-18 DIAGNOSIS — Z96651 Presence of right artificial knee joint: Secondary | ICD-10-CM | POA: Diagnosis not present

## 2013-12-18 DIAGNOSIS — R609 Edema, unspecified: Secondary | ICD-10-CM

## 2013-12-18 DIAGNOSIS — M79604 Pain in right leg: Secondary | ICD-10-CM

## 2013-12-18 DIAGNOSIS — M7989 Other specified soft tissue disorders: Secondary | ICD-10-CM | POA: Diagnosis present

## 2013-12-18 NOTE — Progress Notes (Signed)
VASCULAR LAB PRELIMINARY  PRELIMINARY  PRELIMINARY  PRELIMINARY  Right lower extremity venous duplex completed.    Preliminary report:  Right:  No evidence of DVT, superficial thrombosis, or Baker's cyst.  Auda Finfrock, RVT 12/18/2013, 1:12 PM

## 2014-05-01 ENCOUNTER — Other Ambulatory Visit: Payer: Self-pay | Admitting: Specialist

## 2014-05-01 DIAGNOSIS — E7800 Pure hypercholesterolemia, unspecified: Secondary | ICD-10-CM

## 2014-05-14 DIAGNOSIS — Z01818 Encounter for other preprocedural examination: Secondary | ICD-10-CM | POA: Insufficient documentation

## 2014-05-20 ENCOUNTER — Other Ambulatory Visit: Payer: Self-pay | Admitting: Specialist

## 2014-05-20 ENCOUNTER — Ambulatory Visit
Admission: RE | Admit: 2014-05-20 | Discharge: 2014-05-20 | Disposition: A | Discharge status: Home / Self Care | Payer: Medicare Other | Source: Ambulatory Visit | Attending: Specialist | Admitting: Specialist

## 2014-05-20 DIAGNOSIS — E7800 Pure hypercholesterolemia, unspecified: Secondary | ICD-10-CM

## 2014-07-22 ENCOUNTER — Other Ambulatory Visit: Payer: Self-pay

## 2014-07-22 DIAGNOSIS — Z1231 Encounter for screening mammogram for malignant neoplasm of breast: Secondary | ICD-10-CM

## 2014-07-23 ENCOUNTER — Ambulatory Visit
Admission: RE | Admit: 2014-07-23 | Discharge: 2014-07-23 | Disposition: A | Discharge status: Home / Self Care | Payer: Medicare Other | Source: Ambulatory Visit

## 2014-07-23 DIAGNOSIS — Z1231 Encounter for screening mammogram for malignant neoplasm of breast: Secondary | ICD-10-CM

## 2014-07-24 ENCOUNTER — Other Ambulatory Visit: Payer: Self-pay | Admitting: Family Medicine

## 2014-07-25 ENCOUNTER — Other Ambulatory Visit: Payer: Self-pay | Admitting: Specialist

## 2014-07-28 ENCOUNTER — Other Ambulatory Visit: Payer: Self-pay | Admitting: Family Medicine

## 2014-07-28 DIAGNOSIS — N6310 Unspecified lump in the right breast, unspecified quadrant: Secondary | ICD-10-CM

## 2014-07-30 ENCOUNTER — Ambulatory Visit
Admission: RE | Admit: 2014-07-30 | Discharge: 2014-07-30 | Disposition: A | Discharge status: Home / Self Care | Payer: Medicare Other | Source: Ambulatory Visit | Attending: Family Medicine | Admitting: Family Medicine

## 2014-07-30 DIAGNOSIS — N6310 Unspecified lump in the right breast, unspecified quadrant: Secondary | ICD-10-CM

## 2014-08-08 ENCOUNTER — Ambulatory Visit
Admission: RE | Admit: 2014-08-08 | Discharge: 2014-08-08 | Disposition: A | Discharge status: Home / Self Care | Payer: Medicare Other | Source: Ambulatory Visit | Attending: Specialist | Admitting: Specialist

## 2014-08-27 ENCOUNTER — Encounter: Payer: Self-pay | Admitting: Pulmonary Disease

## 2014-08-27 ENCOUNTER — Ambulatory Visit (INDEPENDENT_AMBULATORY_CARE_PROVIDER_SITE_OTHER): Payer: Medicare Other | Admitting: Pulmonary Disease

## 2014-08-27 VITALS — BP 130/78 | HR 72 | Ht 65.5 in | Wt 283.4 lb

## 2014-08-27 DIAGNOSIS — G4733 Obstructive sleep apnea (adult) (pediatric): Secondary | ICD-10-CM | POA: Diagnosis not present

## 2014-08-27 NOTE — Patient Instructions (Signed)
Home sleep test 

## 2014-08-27 NOTE — Progress Notes (Signed)
Subjective:    Patient ID: [name], female    DOB: [birth year], 59 y.o.   MRN: [id number]  HPI  Chief Complaint  Patient presents with  . Sleep Consult    Referred by Dr. Darnell Level; patient needs sleep study before she can have weight loss surgery; Epworth Score: 31   59 year old obese woman presents for evaluation of sleep-disordered breathing as part of her preop evaluation for bariatric surgery Epworth sleepiness score is 3 and she denies excessive daytime somnolence or fatigue She has gone through the first few stages for evaluation for bariatric surgery. She does have arthritis and hypertension has qualifying comorbidities. She uses a cane to walk. Bedtime is around 11 PM, sleep latency about an hour, the TV stays on through the night, she sleeps on her left side with 2 pillows, reports nocturia with 1-2 awakenings and is out of bed by 9 AM feeling rested with occasional dryness of mouth without a headache. She's gained 6 pounds in the last 2 years. There is no history suggestive of cataplexy, sleep paralysis or parasomnias Husband has noted loud snoring. She has occasionally woken herself up by her snoring  She also reports symptoms of fibromyalgia   Past Medical History  Diagnosis Date  . PONV (postoperative nausea and vomiting)   . Dysrhythmia     rapid HR on occasion-takes Propranolol daily  . Hyperlipidemia     takes LIpitor daily  . Peripheral neuropathy   . Joint pain   . Joint swelling   . Chronic back pain     facet disease and bulging disc; "thoracic and lower back" (10/16/2013)  . Rosacea   . H/O hiatal hernia   . Constipation     r/t pain meds;takes stool softener daily  . History of bladder infections     > 87yr ago  . Anxiety     takes Paxil daily and Xanax prn  . Insomnia   . Hypothyroidism     takes Synthroid daily  . Muscle spasms of head or neck     lumbar and thoracic;takes Robaxin prn  . Seasonal allergies     takes allegra prn  . Kidney  stones   . Anginal pain     "on and off" (11/01/2011) - anxiety related  . Depression   . Family history of anesthesia complication     ":PONV; mom and sisters"  . GERD (gastroesophageal reflux disease)     takes Prilosec   . Arthritis     "knees" (10/16/2013)    Past Surgical History  Procedure Laterality Date  . Knee surgery Left 1982-2013    "17 before 10/31/2013"  . Joint replacement      left knee  . Knee arthroscopy Right   . Anal fissure repair  ~ 2011    "banded hemorrhoids at this time too"  . Colonoscopy    . Esophagogastroduodenoscopy    . Knee arthroplasty Left 2007  . Revision total knee arthroplasty Left 11/01/2011  . Plantar fascia surgery Bilateral 1990's  . Left oophorectomy  1980's  . Total knee revision  11/01/2011    Procedure: TOTAL KNEE REVISION;  Surgeon: Garald Balding, MD;  Location: [facility];  Service: Orthopedics;  Laterality: Left;  Revision Left Total Knee Replacement   . Total knee arthroplasty Right 10/15/2013    Procedure: RIGHT TOTAL KNEE ARTHROPLASTY;  Surgeon: Garald Balding, MD;  Location: [facility];  Service: Orthopedics;  Laterality: Right;  . Patellectomy Right   .  Knee arthroscopy Right     "torn meniscus"  . Abdominal hysterectomy  1980's  . Tubal ligation  1981  . Cardiac catheterization  ~ 1999   Social History   Social History  . Marital Status: Married    Spouse Name: N/A  . Number of Children: N/A  . Years of Education: N/A   Occupational History  . Not on file.   Social History Main Topics  . Smoking status: Never Smoker   . Smokeless tobacco: Never Used  . Alcohol Use: No  . Drug Use: No  . Sexual Activity: Yes    Birth Control/ Protection: Surgical   Other Topics Concern  . Not on file   Social History Narrative    No family history on file.   Review of Systems  Constitutional: Negative for fever, chills and unexpected weight change.  HENT: Negative for congestion, dental problem, ear pain, nosebleeds,  postnasal drip, rhinorrhea, sinus pressure, sneezing, sore throat, trouble swallowing and voice change.   Eyes: Negative for visual disturbance.  Respiratory: Negative for cough, choking and shortness of breath.   Cardiovascular: Negative for chest pain and leg swelling.  Gastrointestinal: Negative for vomiting, abdominal pain and diarrhea.  Genitourinary: Negative for difficulty urinating.  Musculoskeletal: Negative for arthralgias.  Skin: Negative for rash.  Neurological: Negative for tremors, syncope and headaches.  Hematological: Does not bruise/bleed easily.       Objective:   Physical Exam  Gen. Pleasant, obese, in no distress, normal affect ENT - no lesions, no post nasal drip, class 2-3 airway Neck: No JVD, no thyromegaly, no carotid bruits Lungs: no use of accessory muscles, no dullness to percussion, decreased without rales or rhonchi  Cardiovascular: Rhythm regular, heart sounds  normal, no murmurs or gallops, no peripheral edema Abdomen: soft and non-tender, no hepatosplenomegaly, BS normal. Musculoskeletal: No deformities, no cyanosis or clubbing Neuro:  alert, non focal, no tremors        Assessment & Plan:

## 2014-08-27 NOTE — Assessment & Plan Note (Signed)
Given excessive daytime somnolence, narrow pharyngeal exam, witnessed apneas & loud snoring, obstructive sleep apnea is very likely & an overnight polysomnogram will be scheduled as a home study. The pathophysiology of obstructive sleep apnea , it's cardiovascular consequences & modes of treatment including CPAP were discused with the patient in detail & they evidenced understanding.  

## 2014-09-04 DIAGNOSIS — G4733 Obstructive sleep apnea (adult) (pediatric): Secondary | ICD-10-CM | POA: Diagnosis not present

## 2014-09-12 ENCOUNTER — Encounter: Payer: Self-pay | Admitting: Pulmonary Disease

## 2014-09-12 ENCOUNTER — Telehealth: Payer: Self-pay | Admitting: Pulmonary Disease

## 2014-09-12 ENCOUNTER — Other Ambulatory Visit: Payer: Self-pay | Admitting: *Deleted

## 2014-09-12 DIAGNOSIS — G4733 Obstructive sleep apnea (adult) (pediatric): Secondary | ICD-10-CM | POA: Diagnosis not present

## 2014-09-12 NOTE — Telephone Encounter (Signed)
Moderate Sleep Apnea - 28/h Needs CPAP Titration Study

## 2014-09-15 NOTE — Telephone Encounter (Signed)
Patient returned call, may be reached at [phone].

## 2014-09-15 NOTE — Telephone Encounter (Signed)
lmtcb

## 2014-09-15 NOTE — Telephone Encounter (Signed)
Called and spoke with pt Informed pt of moderate OSA and the need for CPAP titration study Pt voiced understanding of the above  Order placed for test  Nothing further is needed

## 2014-09-16 DIAGNOSIS — K760 Fatty (change of) liver, not elsewhere classified: Secondary | ICD-10-CM | POA: Insufficient documentation

## 2014-09-17 ENCOUNTER — Ambulatory Visit (HOSPITAL_BASED_OUTPATIENT_CLINIC_OR_DEPARTMENT_OTHER): Payer: Medicare Other | Attending: Pulmonary Disease | Admitting: Radiology

## 2014-09-17 VITALS — Ht 65.0 in | Wt 275.0 lb

## 2014-09-17 DIAGNOSIS — R0683 Snoring: Secondary | ICD-10-CM | POA: Insufficient documentation

## 2014-09-17 DIAGNOSIS — G4733 Obstructive sleep apnea (adult) (pediatric): Secondary | ICD-10-CM | POA: Diagnosis not present

## 2014-09-17 DIAGNOSIS — G4730 Sleep apnea, unspecified: Secondary | ICD-10-CM | POA: Diagnosis present

## 2014-09-17 DIAGNOSIS — I493 Ventricular premature depolarization: Secondary | ICD-10-CM | POA: Diagnosis not present

## 2014-09-23 ENCOUNTER — Telehealth: Payer: Self-pay | Admitting: Pulmonary Disease

## 2014-09-23 ENCOUNTER — Ambulatory Visit (INDEPENDENT_AMBULATORY_CARE_PROVIDER_SITE_OTHER): Payer: Medicare Other | Admitting: Nurse Practitioner

## 2014-09-23 ENCOUNTER — Encounter: Payer: Self-pay | Admitting: Nurse Practitioner

## 2014-09-23 VITALS — BP 140/80 | HR 68 | Ht 65.0 in | Wt 282.0 lb

## 2014-09-23 DIAGNOSIS — G4733 Obstructive sleep apnea (adult) (pediatric): Secondary | ICD-10-CM

## 2014-09-23 DIAGNOSIS — Z01810 Encounter for preprocedural cardiovascular examination: Secondary | ICD-10-CM | POA: Diagnosis not present

## 2014-09-23 DIAGNOSIS — R0789 Other chest pain: Secondary | ICD-10-CM

## 2014-09-23 NOTE — Patient Instructions (Addendum)
We will be checking the following labs today - NONE  Medication Instructions:    Continue with your current medicines.     Testing/Procedures To Be Arranged:  2 day Lexiscan  Follow-Up:   See Dr. Tamala Julian as needed    Other Special Instructions:   N/A  Call the [place] office at [phone] if you have any questions, problems or concerns.

## 2014-09-23 NOTE — Telephone Encounter (Signed)
Called and spoke to pt. Pt is requesting the results of the CPAP titration study.   Dr. Elsworth Soho, please advise. Thanks.

## 2014-09-23 NOTE — Progress Notes (Signed)
CARDIOLOGY OFFICE NOTE  Date:  09/23/2014    [name] Date of Birth: 08-Oct-1955 Medical Record #[id number]  PCP:  Shirline Frees, MD  Cardiologist:  Tamala Julian    Chief Complaint  Patient presents with  . Pre-op Exam    Surgical clearance - seen for Dr. Tamala Julian    History of Present Illness: [name] is a 59 y.o. female who presents today for a pre op clearance visit. Seen for Dr. Tamala Julian. Has chronic right incomplete RBBB and left axis over past several years. Other issues include OSA, borderline DM and HLD. She is obese. She had cardiac cath back in 2004 following abnormal stress testing - tells me she had a one day stress study. She denies having HTN. She is on statin therapy.   Last seen a year ago - this was surgical clearance.   Comes back today. Here with her husband. Wanting bariatric surgery. Not scheduled yet. She could not get her surgery at [facility] due to insurance issues. Will have at [facility]. She is borderline DM. Unable to exercise due to her knees. She does endorse chest pain - says "has all the time" - she attributes this to anxiety. She will take Inderal and Xanax with relief. Not really short of breath. Sedentary. Needs help just with household chores/cooking/cleaning, etc. Not dizzy or lightheaded.   Past Medical History  Diagnosis Date  . PONV (postoperative nausea and vomiting)   . Dysrhythmia     rapid HR on occasion-takes Propranolol daily  . Hyperlipidemia     takes LIpitor daily  . Peripheral neuropathy   . Joint pain   . Joint swelling   . Chronic back pain     facet disease and bulging disc; "thoracic and lower back" (10/16/2013)  . Rosacea   . H/O hiatal hernia   . Constipation     r/t pain meds;takes stool softener daily  . History of bladder infections     > 5yr ago  . Anxiety     takes Paxil daily and Xanax prn  . Insomnia   . Hypothyroidism     takes Synthroid daily  . Muscle spasms of head or neck     lumbar and  thoracic;takes Robaxin prn  . Seasonal allergies     takes allegra prn  . Kidney stones   . Anginal pain     "on and off" (11/01/2011) - anxiety related  . Depression   . Family history of anesthesia complication     ":PONV; mom and sisters"  . GERD (gastroesophageal reflux disease)     takes Prilosec   . Arthritis     "knees" (10/16/2013)    Past Surgical History  Procedure Laterality Date  . Knee surgery Left 1982-2013    "17 before 10/31/2013"  . Joint replacement      left knee  . Knee arthroscopy Right   . Anal fissure repair  ~ 2011    "banded hemorrhoids at this time too"  . Colonoscopy    . Esophagogastroduodenoscopy    . Knee arthroplasty Left 2007  . Revision total knee arthroplasty Left 11/01/2011  . Plantar fascia surgery Bilateral 1990's  . Left oophorectomy  1980's  . Total knee revision  11/01/2011    Procedure: TOTAL KNEE REVISION;  Surgeon: Garald Balding, MD;  Location: [facility];  Service: Orthopedics;  Laterality: Left;  Revision Left Total Knee Replacement   . Total knee arthroplasty Right 10/15/2013    Procedure:  RIGHT TOTAL KNEE ARTHROPLASTY;  Surgeon: Garald Balding, MD;  Location: [facility];  Service: Orthopedics;  Laterality: Right;  . Patellectomy Right   . Knee arthroscopy Right     "torn meniscus"  . Abdominal hysterectomy  1980's  . Tubal ligation  1981  . Cardiac catheterization  ~ 1999     Medications: Current Outpatient Prescriptions  Medication Sig Dispense Refill  . ALPRAZolam (XANAX) 0.5 MG tablet Take 0.25 mg by mouth 3 (three) times daily as needed for anxiety.     . Ascorbic Acid (VITAMIN C) 1000 MG tablet Take 1,000 mg by mouth daily.    Marland Kitchen atorvastatin (LIPITOR) 10 MG tablet Take 10 mg by mouth Daily.    Marland Kitchen azelastine (ASTELIN) 0.1 % nasal spray Place 2 sprays into both nostrils 2 (two) times daily. Use in each nostril as directed    . cetirizine (ZYRTEC) 10 MG tablet Take 10 mg by mouth daily.    . Cholecalciferol (VITAMIN D-3)  5000 UNITS TABS Take 5,000 Units by mouth daily.    . hydrochlorothiazide (HYDRODIURIL) 25 MG tablet Take 25 mg by mouth as needed (ankle swelling).    Marland Kitchen HYDROmorphone (DILAUDID) 2 MG tablet Take 1-2 tablets (2-4 mg total) by mouth every 4 (four) hours as needed for severe pain. 90 tablet 0  . levothyroxine (SYNTHROID, LEVOTHROID) 150 MCG tablet Take 150 mcg by mouth daily before breakfast.    . lidocaine (LIDODERM) 5 % Place 1 patch onto the skin every 12 (twelve) hours as needed (knee, back). Remove & Discard patch within 12 hours or as directed by MD    . methocarbamol (ROBAXIN) 500 MG tablet Take 1 tablet (500 mg total) by mouth every 6 (six) hours as needed for muscle spasms. 40 tablet 0  . montelukast (SINGULAIR) 10 MG tablet Take 10 mg by mouth at bedtime.    . Multiple Vitamin (MULTIVITAMIN) tablet Take 1 tablet by mouth daily.    Marland Kitchen omeprazole (PRILOSEC) 40 MG capsule Take 40 mg by mouth 2 (two) times daily.     Marland Kitchen PARoxetine (PAXIL-CR) 25 MG 24 hr tablet Take 25 mg by mouth daily. 37.5 mg paxil in the am and 25 mg at night.    . propranolol (INDERAL) 60 MG tablet Take 60 mg by mouth daily.     No current facility-administered medications for this visit.    Allergies: Allergies  Allergen Reactions  . Morphine And Related Other (See Comments)    Feels like bugs crawling over her  . Nsaids Hives  . Ciprofloxacin     Upset stomach   . Codeine     Hyper  . Ibuprofen Hives  . Lodine [Etodolac]     Upset Stomach   . Naproxen Hives  . Niacin And Related     flushing  . Pravastatin     achiness   . Septra [Sulfamethoxazole-Trimethoprim]     Stomach pains  . Tylox [Oxycodone-Acetaminophen]     Hyper  . Wellbutrin [Bupropion]     dizzy  . Buspar [Buspirone] Palpitations    Palpitations    Social History: The patient  reports that she has never smoked. She has never used smokeless tobacco. She reports that she does not drink alcohol or use illicit drugs.   Family  History: The patient's family history includes Heart attack in her father; Heart disease (age of onset: 61) in her father; Heart disease (age of onset: 20) in her mother; Heart failure in her mother.  Review of Systems: Please see the history of present illness.   Otherwise, the review of systems is positive for depression, back pain, muscle pain, sweating, leg pain, snoring, anxiety and occasional swelling.   All other systems are reviewed and negative.   Physical Exam: VS:  BP 140/80 mmHg  Pulse 68  Ht 5\' 5"  (1.651 m)  Wt 282 lb (127.914 kg)  BMI 46.93 kg/m2 .  BMI Body mass index is 46.93 kg/(m^2).  Wt Readings from Last 3 Encounters:  09/23/14 282 lb (127.914 kg)  09/17/14 275 lb (124.739 kg)  08/27/14 283 lb 6.4 oz (128.549 kg)    General: Pleasant. She is morbidly obese. She is in no acute distress. Quite jovial.  HEENT: Normal. Neck: Supple, no JVD, carotid bruits, or masses noted.  Cardiac: Regular rate and rhythm. Heart tones distant. No murmurs, rubs, or gallops. No edema.  Respiratory:  Lungs are clear to auscultation bilaterally with normal work of breathing.  GI: Soft and nontender. Abdomen is obese.  MS: No deformity or atrophy. Gait and ROM intact. Skin: Warm and dry. Color is normal.  Neuro:  Strength and sensation are intact and no gross focal deficits noted.  Psych: Alert, appropriate and with normal affect.   LABORATORY DATA:  EKG:  EKG is ordered today. This demonstrates NSR with incomplete RBBB and left axis deviation - EKG is unchanged.   Lab Results  Component Value Date   WBC 10.0 10/17/2013   HGB 10.9* 10/17/2013   HCT 32.3* 10/17/2013   PLT 232 10/17/2013   GLUCOSE 147* 10/17/2013   ALT 17 10/08/2013   AST 23 10/08/2013   NA 138 10/17/2013   K 4.5 10/17/2013   CL 102 10/17/2013   CREATININE 0.90 10/17/2013   BUN 11 10/17/2013   CO2 26 10/17/2013   INR 0.98 10/08/2013    BNP (last 3 results) No results for input(s): BNP in the last 8760  hours.  ProBNP (last 3 results) No results for input(s): PROBNP in the last 8760 hours.   Other Studies Reviewed Today:   CARDIAC CATH CONCLUSION FROM 2004 1. Normal coronary arteries. 2. Normal left ventricular function. 3. Abnormal Cardiolite represents a false positive study, likely due to  diaphragmatic attenuation. 4. Chest pain, recurrent, noncardiac in origin. Suspect either GI or  musculoskeletal.    Assessment/Plan: 1. Pre op clearance - would favor checking 2 day lexiscan in light of chest pain and multiple risk factors for CAD (weight/HLD/+FH with father dying at 21 and borderline DM). Her EKG is unchanged. Further disposition to follow.   2. Incomplete RBBB -  This is unchanged.   3. Morbid obesity  4. HTN - she denies.   5. HLD - on statin therapy  Current medicines are reviewed with the patient today.  The patient does not have concerns regarding medicines other than what has been noted above.  The following changes have been made:  See above.  Labs/ tests ordered today include:   No orders of the defined types were placed in this encounter.     Disposition:   FU with Dr. Tamala Julian prn unless her stress test is abnormal.   Patient is agreeable to this plan and will call if any problems develop in the interim.   Signed: Burtis Junes, RN, ANP-C 09/23/2014 2:22 PM  [facility] [address] [place], [place]  [address] Phone: [phone] Fax: [phone]

## 2014-09-24 ENCOUNTER — Telehealth (HOSPITAL_COMMUNITY): Payer: Self-pay | Admitting: *Deleted

## 2014-09-24 DIAGNOSIS — G4733 Obstructive sleep apnea (adult) (pediatric): Secondary | ICD-10-CM | POA: Diagnosis not present

## 2014-09-24 NOTE — Telephone Encounter (Signed)
OSA was corrected by CPAP of 14 cm Start Actos CPAP 6-14 cm , mask of choice, humidity, download in 4 weeks Office visit in 6 weeks with TP/ me

## 2014-09-24 NOTE — Addendum Note (Signed)
Addended by: Rigoberto Noel on: 09/24/2014 08:51 AM   Modules accepted: Level of Service

## 2014-09-24 NOTE — Telephone Encounter (Signed)
Left message on voicemail per DPR in reference to upcoming appointment scheduled on 09/29/14 at 25 with detailed instructions given per Myocardial Perfusion Study Information Sheet for the test. LM to arrive 15 minutes early, and that it is imperative to arrive on time for appointment to keep from having the test rescheduled. If you need to cancel or reschedule your appointment, please call the office within 24 hours of your appointment. Failure to do so may result in a cancellation of your appointment, and a $50 no show fee. Phone number given for call back for any questions. Hubbard Robinson, RN

## 2014-09-24 NOTE — Progress Notes (Signed)
Patient Name: [name], [name] Date: 09/17/2014 Gender: Female D.O.B: December 03, 1955 Age (years): 43 Referring Provider: Kara Mead MD, ABSM Height (inches): 65 Interpreting Physician: Kara Mead MD, ABSM Weight (lbs): 275 RPSGT: Carolin Coy BMI: 32 MRN: [id number] Neck Size: 16.50   CLINICAL INFORMATION The patient is referred for a CPAP titration to treat sleep apnea. Sleep evaluation was performed as part of assessment prior to bariatric surgery.  Date of HST: 09/2014, showed moderate OSA with AHI of 28/hour  SLEEP STUDY TECHNIQUE As per the AASM Manual for the Scoring of Sleep and Associated Events v2.3 (April 2016) with a hypopnea requiring 4% desaturations.  The channels recorded and monitored were frontal, central and occipital EEG, electrooculogram (EOG), submentalis EMG (chin), nasal and oral airflow, thoracic and abdominal wall motion, anterior tibialis EMG, snore microphone, electrocardiogram, and pulse oximetry. Continuous positive airway pressure (CPAP) was initiated at the beginning of the study and titrated to treat sleep-disordered breathing.  MEDICATIONS Medications administered by patient during sleep study : Sleep medicine administered - None  TECHNICIAN COMMENTS Comments added by technician: Patient was ordered as cpap titration. Patient was noted to have some difficulity maintaining sleep during the night.    RESPIRATORY PARAMETERS Optimal PAP Pressure (cm): 14cm AHI at Optimal Pressure (/hr): 5 Overall Minimal O2 (%): 87.00 Supine % at Optimal Pressure (%): NA Minimal O2 at Optimal Pressure (%): 87.00      SLEEP ARCHITECTURE The study was initiated at 11:16:02 PM and ended at 5:25:16 AM.  Sleep onset time was 39.9 minutes and the sleep efficiency was 53.9%. The total sleep time was 198.8 minutes.  The patient spent 17.85% of the night in stage N1 sleep, 81.89% in stage N2 sleep, 0.25% in stage N3 and 0.00% in REM.Stage REM latency was N/A  minutes  Wake after sleep onset was 130.5. Alpha intrusion was absent. Supine sleep was 51.72%.  CARDIAC DATA The 2 lead EKG demonstrated sinus rhythm. The mean heart rate was 62.61 beats per minute. Other EKG findings include: PVCs.  LEG MOVEMENT DATA The total Periodic Limb Movements of Sleep (PLMS) were 0. The PLMS index was 0.00. A PLMS index of <15 is considered normal in adults.  IMPRESSIONS An optimal PAP pressure of 14 cm could  selected for this patient based on the available study data. Central sleep apnea was not noted during this titration (CAI = 1.5/h). Mild oxygen desaturations were observed during this titration (min O2 = 87.00%). The patient snored with Soft snoring volume during this titration study. 2-lead EKG demonstrated: PVCs Clinically significant periodic limb movements were not noted during this study. Arousals associated with PLMs were rare.  DIAGNOSIS Obstructive Sleep Apnea (327.23 [G47.33 ICD-10])  RECOMMENDATIONS Recommend a trial of Auto-BiPAP 6-14 cm H2O.  Avoid alcohol, sedatives and other CNS depressants that may worsen sleep apnea and disrupt normal sleep architecture. Sleep hygiene should be reviewed to assess factors that may improve sleep quality. Weight management and regular exercise should be initiated or continued. Return  for re-evaluation after 4 weeks of therapy  Rigoberto Noel. MD

## 2014-09-25 ENCOUNTER — Telehealth: Payer: Self-pay | Admitting: Pulmonary Disease

## 2014-09-25 DIAGNOSIS — G4733 Obstructive sleep apnea (adult) (pediatric): Secondary | ICD-10-CM

## 2014-09-25 NOTE — Telephone Encounter (Signed)
Spoke with pt, states she needs sleep study results and a surgical clearance letter faxed to the below verified fax #. I do not see in patient's only ov note that she was cleared for bariatric surgery.   RA please advise if you're ok with a sx clearance letter being written for pt.  Thanks!

## 2014-09-25 NOTE — Telephone Encounter (Signed)
Does she have a date for surgery? Generally this note will be returned after she has her follow-up for CPAP visit

## 2014-09-25 NOTE — Telephone Encounter (Signed)
I spoke with patient about results and she verbalized understanding and had no questions Order placed 

## 2014-09-26 NOTE — Telephone Encounter (Signed)
lmtcb x1 

## 2014-09-29 ENCOUNTER — Institutional Professional Consult (permissible substitution): Payer: Medicare Other | Admitting: Pulmonary Disease

## 2014-09-29 ENCOUNTER — Ambulatory Visit (HOSPITAL_COMMUNITY): Payer: Medicare Other | Attending: Cardiology

## 2014-09-29 ENCOUNTER — Ambulatory Visit (HOSPITAL_COMMUNITY): Payer: Medicare Other

## 2014-09-29 DIAGNOSIS — F419 Anxiety disorder, unspecified: Secondary | ICD-10-CM | POA: Insufficient documentation

## 2014-09-29 DIAGNOSIS — I4510 Unspecified right bundle-branch block: Secondary | ICD-10-CM | POA: Diagnosis not present

## 2014-09-29 DIAGNOSIS — E785 Hyperlipidemia, unspecified: Secondary | ICD-10-CM | POA: Insufficient documentation

## 2014-09-29 DIAGNOSIS — R0789 Other chest pain: Secondary | ICD-10-CM

## 2014-09-29 DIAGNOSIS — R002 Palpitations: Secondary | ICD-10-CM | POA: Diagnosis not present

## 2014-09-29 DIAGNOSIS — R9439 Abnormal result of other cardiovascular function study: Secondary | ICD-10-CM | POA: Insufficient documentation

## 2014-09-29 DIAGNOSIS — Z01810 Encounter for preprocedural cardiovascular examination: Secondary | ICD-10-CM

## 2014-09-29 MED ORDER — REGADENOSON 0.4 MG/5ML IV SOLN
0.4000 mg | Freq: Once | INTRAVENOUS | Status: AC
  Administered 2014-09-29: 0.4 mg via INTRAVENOUS

## 2014-09-29 MED ORDER — TECHNETIUM TC 99M SESTAMIBI GENERIC - CARDIOLITE
33.0000 | Freq: Once | INTRAVENOUS | Status: AC | PRN
  Administered 2014-09-29: 33 via INTRAVENOUS

## 2014-09-29 NOTE — Telephone Encounter (Signed)
lmtcb x2 for pt. 

## 2014-09-30 ENCOUNTER — Ambulatory Visit (HOSPITAL_COMMUNITY): Payer: Medicare Other

## 2014-09-30 ENCOUNTER — Ambulatory Visit (HOSPITAL_COMMUNITY): Payer: Medicare Other | Attending: Cardiology

## 2014-09-30 DIAGNOSIS — R0989 Other specified symptoms and signs involving the circulatory and respiratory systems: Secondary | ICD-10-CM

## 2014-09-30 LAB — MYOCARDIAL PERFUSION IMAGING
LV dias vol: 89 mL
LV sys vol: 23 mL
Peak HR: 95 {beats}/min
RATE: 0.31
Rest HR: 75 {beats}/min
SDS: 5
SRS: 1
SSS: 6
TID: 0.94

## 2014-09-30 MED ORDER — TECHNETIUM TC 99M SESTAMIBI GENERIC - CARDIOLITE
31.5000 | Freq: Once | INTRAVENOUS | Status: AC | PRN
  Administered 2014-09-30: 32 via INTRAVENOUS

## 2014-09-30 NOTE — Telephone Encounter (Signed)
Spoke with pt, states she does not yet have a sx date.  Her surgeon will not schedule her until she has all of her clearance.  Pt is picking up cpap machine tonight, and is scheduled to follow up with RA on 11/17/14.  Pt is hoping to get clearance before that visit.    RA please advise.  Thanks!

## 2014-09-30 NOTE — Telephone Encounter (Signed)
lmtcb X3 for pt. 

## 2014-09-30 NOTE — Telephone Encounter (Signed)
lmtcb X1 to make pt aware.  Order placed for 4 week download. Forwarding to [place] to follow up on.

## 2014-09-30 NOTE — Telephone Encounter (Signed)
Needs to use CPAP x 4 wks , we will then check download & provide clearance on Fu visit

## 2014-10-01 ENCOUNTER — Telehealth: Payer: Self-pay | Admitting: Pulmonary Disease

## 2014-10-01 NOTE — Telephone Encounter (Signed)
I called her yesterday afternoon to keep her updated about getting the download in 4 weeks before RA will write her sx clearance letter.  It looks like Sharyn Lull may have already closed the encounter

## 2014-10-01 NOTE — Telephone Encounter (Signed)
Noted. Set a reminder to obtain download. Nothing further needed.

## 2014-10-01 NOTE — Telephone Encounter (Signed)
Did you call this patient? I don't see where you did.

## 2014-10-03 ENCOUNTER — Telehealth: Payer: Self-pay

## 2014-10-03 NOTE — Telephone Encounter (Signed)
Pt aware of lexiscan results with verbal understanding. Results fwd to surgeon Dr.John Bruce. Pt voiced appreciation.

## 2014-10-03 NOTE — Telephone Encounter (Signed)
-----   Message from Belva Crome, MD sent at 10/02/2014  1:25 PM EDT ----- I agree that this is a low risk study. It is conceivable that the "ischemia" is due to diaphragm attenuation. If chest pain is anginal quality, she may need coronary angiography. If chest discomfort is atypical, no further evaluation and clear for surgery.

## 2014-10-25 ENCOUNTER — Other Ambulatory Visit: Payer: Self-pay | Admitting: Specialist

## 2014-10-25 ENCOUNTER — Other Ambulatory Visit: Payer: Self-pay | Admitting: Orthopaedic Surgery

## 2014-10-25 DIAGNOSIS — M25561 Pain in right knee: Secondary | ICD-10-CM

## 2014-10-29 ENCOUNTER — Telehealth: Payer: Self-pay | Admitting: *Deleted

## 2014-10-29 NOTE — Telephone Encounter (Signed)
error 

## 2014-10-31 ENCOUNTER — Telehealth: Payer: Self-pay | Admitting: Pulmonary Disease

## 2014-10-31 NOTE — Telephone Encounter (Signed)
Compliance Report - 9/28-10/27 Per Dr. Elsworth Soho: Average Pressure is 12cm Cleared for surgery with due precautions for OSA -------------------------------------------------------------  Called and spoke with patient Letter faxed to Case Manager - Dione Plover at [phone] and to Dr. Darnell Level for surgery clearance  Patient notified. Nothing further needed.

## 2014-10-31 NOTE — Telephone Encounter (Signed)
Download in RA look at.Rigoberto Noel, MD at 09/30/2014 4:48 PM     Status: Signed       Expand All Collapse All   Needs to use CPAP x 4 wks , we will then check download & provide clearance on Fu visit            Len Blalock, CMA at 09/30/2014 2:56 PM     Status: Signed       Expand All Collapse All   Spoke with pt, states she does not yet have a sx date. Her surgeon will not schedule her until she has all of her clearance. Pt is picking up cpap machine tonight, and is scheduled to follow up with RA on 11/17/14. Pt is hoping to get clearance before that visit.   RA please advise. Thanks!

## 2014-11-09 ENCOUNTER — Ambulatory Visit
Admission: RE | Admit: 2014-11-09 | Discharge: 2014-11-09 | Disposition: A | Discharge status: Home / Self Care | Payer: Medicare Other | Source: Ambulatory Visit | Attending: Orthopaedic Surgery | Admitting: Orthopaedic Surgery

## 2014-11-09 DIAGNOSIS — M25561 Pain in right knee: Secondary | ICD-10-CM

## 2014-11-13 ENCOUNTER — Encounter: Payer: Self-pay | Admitting: Pulmonary Disease

## 2014-11-17 ENCOUNTER — Ambulatory Visit (INDEPENDENT_AMBULATORY_CARE_PROVIDER_SITE_OTHER): Payer: Medicare Other | Admitting: Adult Health

## 2014-11-17 ENCOUNTER — Encounter: Payer: Self-pay | Admitting: Adult Health

## 2014-11-17 VITALS — BP 122/72 | HR 69 | Ht 65.0 in | Wt 291.0 lb

## 2014-11-17 DIAGNOSIS — G4733 Obstructive sleep apnea (adult) (pediatric): Secondary | ICD-10-CM | POA: Diagnosis not present

## 2014-11-17 NOTE — Patient Instructions (Signed)
Continue on CPAP At bedtime   Work on weight loss.  Goal is to wear at least 6hr each night.  follow up Dr. Elsworth Soho  In 4 months and As needed

## 2014-11-22 NOTE — Assessment & Plan Note (Signed)
Work on weight loss.

## 2014-11-22 NOTE — Progress Notes (Signed)
Subjective:    Patient ID: [name], female    DOB: [birth year], 59 y.o.   MRN: [id number]  HPI 59 yo female seen for sleep consult 08/2014 .   TEST  Sleep study  >AHI 28/hr  09/2014 CPAP titration corrected by 14cm >auto CPAP 6-14cm   11/22/2014 Follow up : OSA  Pt returns for 3 month follow up . Recently seen for sleep consult in August.  Sleep study showed moderate OSA with AHI 28/hr . She was set up for CPAP titration w/ good control at 14cm . Started on autoCPAP 6-14cm . Feels CPAP is helping.  She is less tired.  Pt states that she has been using the CPAP regularly and has noticed an increase in her daytime energy levels. Pt has had to try several mask options to find one that fits. Pt reports that she does have some occasional air leakage due to mask fit.  Download last month shows excellent compliance with AHI 1.7 on autoset 6-14, mild leaks,  Denies chest pain, orthopnea, edema or fever.     Past Medical History  Diagnosis Date  . PONV (postoperative nausea and vomiting)   . Dysrhythmia     rapid HR on occasion-takes Propranolol daily  . Hyperlipidemia     takes LIpitor daily  . Peripheral neuropathy ([place])   . Joint pain   . Joint swelling   . Chronic back pain     facet disease and bulging disc; "thoracic and lower back" (10/16/2013)  . Rosacea   . H/O hiatal hernia   . Constipation     r/t pain meds;takes stool softener daily  . History of bladder infections     > 40yr ago  . Anxiety     takes Paxil daily and Xanax prn  . Insomnia   . Hypothyroidism     takes Synthroid daily  . Muscle spasms of head or neck     lumbar and thoracic;takes Robaxin prn  . Seasonal allergies     takes allegra prn  . Kidney stones   . Anginal pain ([place])     "on and off" (11/01/2011) - anxiety related  . Depression   . Family history of anesthesia complication     ":PONV; mom and sisters"  . GERD (gastroesophageal reflux disease)     takes Prilosec   . Arthritis    "knees" (10/16/2013)   Current Outpatient Prescriptions on File Prior to Visit  Medication Sig Dispense Refill  . ALPRAZolam (XANAX) 0.5 MG tablet Take 0.25 mg by mouth 3 (three) times daily as needed for anxiety.     . Ascorbic Acid (VITAMIN C) 1000 MG tablet Take 1,000 mg by mouth daily.    Marland Kitchen atorvastatin (LIPITOR) 10 MG tablet Take 10 mg by mouth Daily.    Marland Kitchen azelastine (ASTELIN) 0.1 % nasal spray Place 2 sprays into both nostrils 2 (two) times daily. Use in each nostril as directed    . cetirizine (ZYRTEC) 10 MG tablet Take 10 mg by mouth daily.    . Cholecalciferol (VITAMIN D-3) 5000 UNITS TABS Take 5,000 Units by mouth daily.    . hydrochlorothiazide (HYDRODIURIL) 25 MG tablet Take 25 mg by mouth as needed (ankle swelling).    Marland Kitchen HYDROmorphone (DILAUDID) 2 MG tablet Take 1-2 tablets (2-4 mg total) by mouth every 4 (four) hours as needed for severe pain. 90 tablet 0  . levothyroxine (SYNTHROID, LEVOTHROID) 150 MCG tablet Take 150 mcg by mouth daily before breakfast.    .  lidocaine (LIDODERM) 5 % Place 1 patch onto the skin every 12 (twelve) hours as needed (knee, back). Remove & Discard patch within 12 hours or as directed by MD    . methocarbamol (ROBAXIN) 500 MG tablet Take 1 tablet (500 mg total) by mouth every 6 (six) hours as needed for muscle spasms. 40 tablet 0  . montelukast (SINGULAIR) 10 MG tablet Take 10 mg by mouth at bedtime.    . Multiple Vitamin (MULTIVITAMIN) tablet Take 1 tablet by mouth daily.    Marland Kitchen omeprazole (PRILOSEC) 40 MG capsule Take 40 mg by mouth 2 (two) times daily.     Marland Kitchen PARoxetine (PAXIL-CR) 25 MG 24 hr tablet Take 25 mg by mouth daily. 37.5 mg paxil in the am and 25 mg at night.    . propranolol (INDERAL) 60 MG tablet Take 60 mg by mouth daily.     No current facility-administered medications on file prior to visit.      Review of Systems Constitutional:   No  weight loss, night sweats,  Fevers, chills,  +fatigue, or  lassitude.  HEENT:   No headaches,   Difficulty swallowing,  Tooth/dental problems, or  Sore throat,                No sneezing, itching, ear ache, nasal congestion, post nasal drip,   CV:  No chest pain,  Orthopnea, PND, swelling in lower extremities, anasarca, dizziness, palpitations, syncope.   GI  No heartburn, indigestion, abdominal pain, nausea, vomiting, diarrhea, change in bowel habits, loss of appetite, bloody stools.   Resp: No shortness of breath with exertion or at rest.  No excess mucus, no productive cough,  No non-productive cough,  No coughing up of blood.  No change in color of mucus.  No wheezing.  No chest wall deformity  Skin: no rash or lesions.  GU: no dysuria, change in color of urine, no urgency or frequency.  No flank pain, no hematuria   MS:  No joint pain or swelling.  No decreased range of motion.  No back pain.  Psych:  No change in mood or affect. No depression or anxiety.  No memory loss.         Objective:   Physical Exam GEN: A/Ox3; pleasant , NAD, obese   HEENT:  [place]/AT,  EACs-clear, TMs-wnl, NOSE-clear, THROAT-clear, no lesions, no postnasal drip or exudate noted. Class 2-3 MP airway   NECK:  Supple w/ fair ROM; no JVD; normal carotid impulses w/o bruits; no thyromegaly or nodules palpated; no lymphadenopathy.  RESP  Clear  P & A; w/o, wheezes/ rales/ or rhonchi.no accessory muscle use, no dullness to percussion  CARD:  RRR, no m/r/g  , no peripheral edema, pulses intact, no cyanosis or clubbing.  GI:   Soft & nt; nml bowel sounds; no organomegaly or masses detected.  Musco: Warm bil, no deformities or joint swelling noted.   Neuro: alert, no focal deficits noted.    Skin: Warm, no lesions or rashes         Assessment & Plan:

## 2014-11-22 NOTE — Assessment & Plan Note (Signed)
Good control on CPAP   Plan  Continue on CPAP At bedtime   Work on weight loss.  Goal is to wear at least 6hr each night.  follow up Dr. Elsworth Soho  In 4 months and As needed

## 2014-11-26 ENCOUNTER — Encounter (HOSPITAL_BASED_OUTPATIENT_CLINIC_OR_DEPARTMENT_OTHER): Payer: Medicare Other

## 2014-12-03 ENCOUNTER — Encounter: Payer: Self-pay | Admitting: Adult Health

## 2014-12-09 ENCOUNTER — Telehealth: Payer: Self-pay | Admitting: Pulmonary Disease

## 2014-12-09 NOTE — Telephone Encounter (Signed)
Compliance Report - 12/01/14  Per Dr. Elsworth Soho: CPAP Effective Needs to be more consistent every night.

## 2014-12-09 NOTE — Telephone Encounter (Signed)
Called pt and notified of results per Dr Elsworth Soho Pt voiced understanding  Nothing further is needed

## 2014-12-16 ENCOUNTER — Encounter: Payer: Self-pay | Admitting: Pulmonary Disease

## 2015-01-06 ENCOUNTER — Telehealth: Payer: Self-pay | Admitting: Pulmonary Disease

## 2015-01-06 NOTE — Telephone Encounter (Signed)
Received request for records from [facility] 25 pages were faxed to [phone] Rec'd confirmation from fax

## 2015-01-20 ENCOUNTER — Telehealth: Payer: Self-pay | Admitting: Pulmonary Disease

## 2015-01-20 DIAGNOSIS — G4733 Obstructive sleep apnea (adult) (pediatric): Secondary | ICD-10-CM

## 2015-01-20 NOTE — Telephone Encounter (Signed)
Pt is requesting a new mask - specifically the Nasal Pillows  Patient also needs CPAP supplies - hosing, filters, head gear, etc DME: [facility] Please advise Dr Elsworth Soho if okay to place order.

## 2015-01-20 NOTE — Telephone Encounter (Signed)
Ok to send rx

## 2015-01-20 NOTE — Telephone Encounter (Signed)
Called and spoke with patient. Informed the patient that orders will be placed. Patient voiced understanding and had no further questions. Orders placed through [facility]. Nothing further needed at this time.

## 2015-03-03 DIAGNOSIS — K449 Diaphragmatic hernia without obstruction or gangrene: Secondary | ICD-10-CM | POA: Insufficient documentation

## 2015-03-26 ENCOUNTER — Ambulatory Visit: Payer: Medicare Other | Admitting: Pulmonary Disease

## 2015-04-01 ENCOUNTER — Ambulatory Visit: Payer: Medicare Other | Admitting: Pulmonary Disease

## 2015-04-22 ENCOUNTER — Ambulatory Visit: Payer: Medicare Other | Admitting: Adult Health

## 2015-04-28 ENCOUNTER — Encounter: Payer: Self-pay | Admitting: Adult Health

## 2015-04-28 ENCOUNTER — Ambulatory Visit (INDEPENDENT_AMBULATORY_CARE_PROVIDER_SITE_OTHER): Payer: Medicare Other | Admitting: Adult Health

## 2015-04-28 VITALS — BP 108/68 | HR 70 | Temp 98.0°F | Ht 65.0 in | Wt 256.0 lb

## 2015-04-28 DIAGNOSIS — E66813 Obesity, class 3: Secondary | ICD-10-CM

## 2015-04-28 DIAGNOSIS — G4733 Obstructive sleep apnea (adult) (pediatric): Secondary | ICD-10-CM

## 2015-04-28 NOTE — Assessment & Plan Note (Signed)
Great job with wt loss.

## 2015-04-28 NOTE — Patient Instructions (Signed)
Decrease CPAP Pressure to 11cm H2O.  Keep up good work.  Continue on CPAP At bedtime   Follow up Dr. Elsworth Soho  In  1 year and As needed   Very proud of all your hard work .

## 2015-04-28 NOTE — Assessment & Plan Note (Signed)
Doing very well on CPAP   Plan  Decrease CPAP Pressure to 11cm H2O.  Keep up good work.  Continue on CPAP At bedtime   Follow up Dr. Elsworth Soho  In  1 year and As needed   Very proud of all your hard work .

## 2015-04-28 NOTE — Progress Notes (Signed)
Subjective:    Patient ID: [name], female    DOB: March 26, 1955, 60 y.o.   MRN: [id number]  HPI 60 yo female seen for sleep consult 08/2014 .   TEST  Sleep study  >AHI 28/hr  09/2014 CPAP titration corrected by 14cm >auto CPAP 6-14cm   04/28/2015 Follow up : OSA  Pt returns for 4 month follow up .  Download shows excellent compliance with avg usage at 6hr with AHI at 2.8 , min leaks.  Feels pressure is too strong since she lost weight.  Has really been working on wt loss with 25lb before surgery . Marland Kitchen She is so excited . She is eating healthy.  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 . She feels so much better.  Seen for sleep consult in August.  Sleep study showed moderate OSA with AHI 28/hr . She was set up for CPAP titration w/ good control at 14cm . Started on autoCPAP 6-14cm .    Denies chest pain, orthopnea, edema or fever.     Past Medical History  Diagnosis Date  . PONV (postoperative nausea and vomiting)   . Dysrhythmia     rapid HR on occasion-takes Propranolol daily  . Hyperlipidemia     takes LIpitor daily  . Peripheral neuropathy ([place])   . Joint pain   . Joint swelling   . Chronic back pain     facet disease and bulging disc; "thoracic and lower back" (10/16/2013)  . Rosacea   . H/O hiatal hernia   . Constipation     r/t pain meds;takes stool softener daily  . History of bladder infections     > 80yr ago  . Anxiety     takes Paxil daily and Xanax prn  . Insomnia   . Hypothyroidism     takes Synthroid daily  . Muscle spasms of head or neck     lumbar and thoracic;takes Robaxin prn  . Seasonal allergies     takes allegra prn  . Kidney stones   . Anginal pain ([place])     "on and off" (11/01/2011) - anxiety related  . Depression   . Family history of anesthesia complication     ":PONV; mom and sisters"  . GERD (gastroesophageal reflux disease)     takes Prilosec   . Arthritis     "knees" (10/16/2013)   Current  Outpatient Prescriptions on File Prior to Visit  Medication Sig Dispense Refill  . ALPRAZolam (XANAX) 0.5 MG tablet Take 0.25 mg by mouth 3 (three) times daily as needed for anxiety.     . Ascorbic Acid (VITAMIN C) 1000 MG tablet Take 1,000 mg by mouth daily.    Marland Kitchen atorvastatin (LIPITOR) 10 MG tablet Take 10 mg by mouth Daily.    . cetirizine (ZYRTEC) 10 MG tablet Take 10 mg by mouth daily.    . Cholecalciferol (VITAMIN D-3) 5000 UNITS TABS Take 5,000 Units by mouth daily.    . hydrochlorothiazide (HYDRODIURIL) 25 MG tablet Take 25 mg by mouth as needed (ankle swelling).    Marland Kitchen HYDROmorphone (DILAUDID) 2 MG tablet Take 1-2 tablets (2-4 mg total) by mouth every 4 (four) hours as needed for severe pain. 90 tablet 0  . levothyroxine (SYNTHROID, LEVOTHROID) 150 MCG tablet Take 150 mcg by mouth daily before breakfast.    . lidocaine (LIDODERM) 5 % Place 1 patch onto the skin every 12 (twelve) hours as needed (knee, back). Remove & Discard patch  within 12 hours or as directed by MD    . methocarbamol (ROBAXIN) 500 MG tablet Take 1 tablet (500 mg total) by mouth every 6 (six) hours as needed for muscle spasms. 40 tablet 0  . omeprazole (PRILOSEC) 40 MG capsule Take 40 mg by mouth 2 (two) times daily.     Marland Kitchen PARoxetine (PAXIL-CR) 25 MG 24 hr tablet Take 25 mg by mouth daily. 37.5 mg paxil in the am and 25 mg at night.    Marland Kitchen PARoxetine (PAXIL-CR) 37.5 MG 24 hr tablet Take 37.5 mg by mouth every morning.    . pramipexole (MIRAPEX) 0.125 MG tablet Take 0.125 mg by mouth 3 (three) times daily.    . propranolol (INDERAL) 60 MG tablet Take 60 mg by mouth daily.    . Multiple Vitamin (MULTIVITAMIN) tablet Take 1 tablet by mouth daily. Reported on 04/28/2015     No current facility-administered medications on file prior to visit.      Review of Systems Constitutional:   No  weight loss, night sweats,  Fevers, chills,  +fatigue, or  lassitude.  HEENT:   No headaches,  Difficulty swallowing,  Tooth/dental  problems, or  Sore throat,                No sneezing, itching, ear ache, nasal congestion, post nasal drip,   CV:  No chest pain,  Orthopnea, PND, swelling in lower extremities, anasarca, dizziness, palpitations, syncope.   GI  No heartburn, indigestion, abdominal pain, nausea, vomiting, diarrhea, change in bowel habits, loss of appetite, bloody stools.   Resp: No shortness of breath with exertion or at rest.  No excess mucus, no productive cough,  No non-productive cough,  No coughing up of blood.  No change in color of mucus.  No wheezing.  No chest wall deformity  Skin: no rash or lesions.  GU: no dysuria, change in color of urine, no urgency or frequency.  No flank pain, no hematuria   MS:  No joint pain or swelling.  No decreased range of motion.  No back pain.  Psych:  No change in mood or affect. No depression or anxiety.  No memory loss.         Objective:   Physical Exam   Filed Vitals:   04/28/15 1608  BP: 108/68  Pulse: 70  Temp: 98 F (36.7 C)  TempSrc: Oral  Height: 5\' 5"  (1.651 m)  Weight: 256 lb (116.121 kg)  SpO2: 95%   GEN: A/Ox3; pleasant , NAD, obese   HEENT:  [place]/AT,  EACs-clear, TMs-wnl, NOSE-clear, THROAT-clear, no lesions, no postnasal drip or exudate noted. Class 2-3 MP airway   NECK:  Supple w/ fair ROM; no JVD; normal carotid impulses w/o bruits; no thyromegaly or nodules palpated; no lymphadenopathy.  RESP  Clear  P & A; w/o, wheezes/ rales/ or rhonchi.no accessory muscle use, no dullness to percussion  CARD:  RRR, no m/r/g  , no peripheral edema, pulses intact, no cyanosis or clubbing.  GI:   Soft & nt; nml bowel sounds; no organomegaly or masses detected.  Musco: Warm bil, no deformities or joint swelling noted.   Neuro: alert, no focal deficits noted.    Skin: Warm, no lesions or rashes  Sylvan Sookdeo NP-C  [facility]  04/28/2015        Assessment & Plan:

## 2015-04-30 NOTE — Progress Notes (Signed)
Reviewed & agree with plan  

## 2015-05-13 ENCOUNTER — Encounter: Payer: Self-pay | Admitting: Adult Health

## 2015-08-05 ENCOUNTER — Other Ambulatory Visit: Payer: Self-pay | Admitting: Orthopaedic Surgery

## 2015-08-05 DIAGNOSIS — M545 Low back pain: Secondary | ICD-10-CM

## 2015-08-15 ENCOUNTER — Other Ambulatory Visit: Payer: Medicare Other

## 2015-08-22 ENCOUNTER — Other Ambulatory Visit: Payer: Medicare Other

## 2015-08-26 ENCOUNTER — Other Ambulatory Visit: Payer: Self-pay | Admitting: Family Medicine

## 2015-08-26 DIAGNOSIS — Z1231 Encounter for screening mammogram for malignant neoplasm of breast: Secondary | ICD-10-CM

## 2015-09-14 ENCOUNTER — Ambulatory Visit: Payer: Medicare Other

## 2015-09-21 ENCOUNTER — Ambulatory Visit: Payer: Medicare Other

## 2015-10-05 ENCOUNTER — Ambulatory Visit: Payer: Medicare Other

## 2015-10-12 ENCOUNTER — Ambulatory Visit
Admission: RE | Admit: 2015-10-12 | Discharge: 2015-10-12 | Disposition: A | Discharge status: Home / Self Care | Payer: Medicare Other | Source: Ambulatory Visit | Attending: Family Medicine | Admitting: Family Medicine

## 2015-10-12 DIAGNOSIS — Z1231 Encounter for screening mammogram for malignant neoplasm of breast: Secondary | ICD-10-CM

## 2016-05-05 ENCOUNTER — Encounter (INDEPENDENT_AMBULATORY_CARE_PROVIDER_SITE_OTHER): Payer: Self-pay

## 2016-05-05 ENCOUNTER — Ambulatory Visit (INDEPENDENT_AMBULATORY_CARE_PROVIDER_SITE_OTHER): Payer: Medicare Other | Admitting: Diagnostic Neuroimaging

## 2016-05-05 DIAGNOSIS — R200 Anesthesia of skin: Secondary | ICD-10-CM

## 2016-05-05 DIAGNOSIS — Z0289 Encounter for other administrative examinations: Secondary | ICD-10-CM

## 2016-05-06 NOTE — Procedures (Signed)
[facility]  NCS (NERVE CONDUCTION STUDY) WITH EMG (ELECTROMYOGRAPHY) REPORT   STUDY DATE: 05/05/16 PATIENT NAME: [name] DOB: 1955-04-21 MRN: [id number]  ORDERING CLINICIAN: Shirline Frees, MD  TECHNOLOGIST: Oneita Jolly ELECTROMYOGRAPHER: Earlean Polka. Draysen Weygandt, MD  CLINICAL INFORMATION: 61 year old female with bilateral hand numbness.  FINDINGS: NERVE CONDUCTION STUDY: Bilateral median motor responses have prolonged distal latencies (left 4.6 ms, right 4.7 ms), normal amplitudes, normal conduction velocities.  Bilateral ulnar motor responses and F wave latencies are normal.  Bilateral ulnar sensory responses are normal. Bilateral median sensory responses have slightly prolonged latencies and normal amplitudes.  Bilateral median to ulnar transcarpal comparison studies show prolonged peak latency differences of 0.8 ms on the left and 1.0 ms on the right.  Right radial sensory response is normal.    NEEDLE ELECTROMYOGRAPHY: Needle examination of right upper to mid deltoid, biceps, triceps, flexor carpi radialis, first dorsal interosseous is normal.    IMPRESSION:  Abnormal study demonstrating: - Mild bilateral median neuropathies at the wrist consistent with mild bilateral carpal tunnel syndrome.     INTERPRETING PHYSICIAN:  Penni Bombard, MD Certified in Neurology, Neurophysiology and Neuroimaging  [facility] [address], [place], [place] [address] [phone]  [facility]    Nerve / Sites Rec. Site Latency Ref. Amplitude Ref. Rel Amp Segments Distance Velocity Ref. Area    ms ms mV mV %  cm m/s m/s mVms  L Median - APB     Wrist APB 4.6 ?4.4 9.5 ?4.0 100 Wrist - APB 7   31.1     Upper arm APB 8.4  9.2  97.4 Upper arm - Wrist 20 53 ?49 31.2  R Median - APB     Wrist APB 4.7 ?4.4 7.3 ?4.0 100 Wrist - APB 7   21.7     Upper arm APB 8.4  7.1  97.1 Upper arm - Wrist 21 57 ?49 21.4  L Ulnar - ADM     Wrist  ADM 3.0 ?3.3 9.6 ?6.0 100 Wrist - ADM 7   29.1     B.Elbow ADM 6.9  8.2  85.5 B.Elbow - Wrist 19 49 ?49 27.3     A.Elbow ADM 9.1  7.5  91.9 A.Elbow - B.Elbow 11 49 ?49 26.4         A.Elbow - Wrist      R Ulnar - ADM     Wrist ADM 3.2 ?3.3 10.0 ?6.0 100 Wrist - ADM 7   26.1     B.Elbow ADM 7.0  9.2  92 B.Elbow - Wrist 19 49 ?49 24.9     A.Elbow ADM 9.4  7.9  86.2 A.Elbow - B.Elbow 12 50 ?49 24.7         A.Elbow - Wrist                 SNC    Nerve / Sites Rec. Site Peak Lat Ref.  Amp Ref. Segments Distance Peak Diff Ref.    ms ms V V  cm ms ms  R Radial - Anatomical snuff box (Forearm)     Forearm Wrist 2.40 ?2.90 11 ?15 Forearm - Wrist 10    L Median, Ulnar - Transcarpal comparison     Median Palm Wrist 2.86 ?2.20 52 ?35 Median Palm - Wrist 8       Ulnar Palm Wrist 2.08 ?2.20 13 ?12 Ulnar Palm - Wrist 8  Median Palm - Ulnar Palm  0.8 ?0.4  R Median, Ulnar - Transcarpal comparison     Median Palm Wrist 2.92 ?2.20 60 ?35 Median Palm - Wrist 8       Ulnar Palm Wrist 1.88 ?2.20 13 ?12 Ulnar Palm - Wrist 8          Median Palm - Ulnar Palm  1.0 ?0.4  L Median - Orthodromic (Dig II, Mid palm)     Dig II Wrist 3.75 ?3.40 12 ?10 Dig II - Wrist 13    R Median - Orthodromic (Dig II, Mid palm)     Dig II Wrist 3.80 ?3.40 27 ?10 Dig II - Wrist 13    L Ulnar - Orthodromic, (Dig V, Mid palm)     Dig V Wrist 3.02 ?3.10 5 ?5 Dig V - Wrist 11    R Ulnar - Orthodromic, (Dig V, Mid palm)     Dig V Wrist 2.81 ?3.10 8 ?5 Dig V - Wrist 37                     F  Wave    Nerve F Lat Ref.   ms ms  L Ulnar - ADM 28.9 ?32.0  R Ulnar - ADM 28.4 ?32.0         EMG full       EMG Summary Table    Spontaneous MUAP Recruitment  Muscle IA Fib PSW Fasc Other Amp Dur. Poly Pattern  R. Deltoid Normal None None None _______ Normal Normal Normal Normal  R. Biceps brachii Normal None None None _______ Normal Normal Normal Normal  R. Triceps brachii Normal None None None _______ Normal Normal Normal  Normal  R. Flexor carpi radialis Normal None None None _______ Normal Normal Normal Normal  R. First dorsal interosseous Normal None None None _______ Normal Normal Normal Normal

## 2016-07-14 ENCOUNTER — Ambulatory Visit
Admission: RE | Admit: 2016-07-14 | Discharge: 2016-07-14 | Disposition: A | Discharge status: Home / Self Care | Payer: Medicare Other | Source: Ambulatory Visit | Attending: Anesthesiology | Admitting: Anesthesiology

## 2016-07-14 ENCOUNTER — Other Ambulatory Visit: Payer: Self-pay | Admitting: Anesthesiology

## 2016-07-14 DIAGNOSIS — M549 Dorsalgia, unspecified: Secondary | ICD-10-CM

## 2016-09-01 ENCOUNTER — Telehealth (INDEPENDENT_AMBULATORY_CARE_PROVIDER_SITE_OTHER): Payer: Self-pay | Admitting: Orthopaedic Surgery

## 2016-09-01 NOTE — Telephone Encounter (Signed)
Please schedule this patient.   

## 2016-09-01 NOTE — Telephone Encounter (Signed)
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 Patient wants to know if Dr. Durward Fortes would evaluate her for her neck, or if she would need to see Dr. Louanne Skye. Patient would prefer not to see Dr. Lorin Mercy. Please call patient to advise.

## 2016-09-01 NOTE — Telephone Encounter (Signed)
Will see

## 2016-09-01 NOTE — Telephone Encounter (Signed)
What would you like to do? Nitka?

## 2016-09-13 NOTE — Telephone Encounter (Signed)
I LMOM for patient to call, and schedule appt if she needs to.

## 2016-10-10 ENCOUNTER — Other Ambulatory Visit: Payer: Self-pay | Admitting: Family Medicine

## 2016-10-10 DIAGNOSIS — Z1231 Encounter for screening mammogram for malignant neoplasm of breast: Secondary | ICD-10-CM

## 2016-11-01 ENCOUNTER — Ambulatory Visit: Payer: Medicare Other

## 2016-11-04 ENCOUNTER — Other Ambulatory Visit: Payer: Self-pay | Admitting: Family Medicine

## 2016-11-04 DIAGNOSIS — M5412 Radiculopathy, cervical region: Secondary | ICD-10-CM

## 2016-11-15 ENCOUNTER — Other Ambulatory Visit: Payer: Medicare Other

## 2016-11-15 ENCOUNTER — Ambulatory Visit
Admission: RE | Admit: 2016-11-15 | Discharge: 2016-11-15 | Disposition: A | Discharge status: Home / Self Care | Payer: Medicare Other | Source: Ambulatory Visit | Attending: Family Medicine | Admitting: Family Medicine

## 2016-11-15 DIAGNOSIS — M5412 Radiculopathy, cervical region: Secondary | ICD-10-CM

## 2016-11-21 ENCOUNTER — Ambulatory Visit (INDEPENDENT_AMBULATORY_CARE_PROVIDER_SITE_OTHER): Payer: Medicare Other | Admitting: Orthopaedic Surgery

## 2016-11-22 ENCOUNTER — Ambulatory Visit (INDEPENDENT_AMBULATORY_CARE_PROVIDER_SITE_OTHER): Payer: Medicare Other | Admitting: Orthopaedic Surgery

## 2016-11-28 ENCOUNTER — Ambulatory Visit: Payer: Medicare Other

## 2016-12-02 ENCOUNTER — Ambulatory Visit (INDEPENDENT_AMBULATORY_CARE_PROVIDER_SITE_OTHER): Payer: Medicare Other | Admitting: Orthopaedic Surgery

## 2016-12-14 ENCOUNTER — Ambulatory Visit (INDEPENDENT_AMBULATORY_CARE_PROVIDER_SITE_OTHER): Payer: Medicare Other | Admitting: Orthopedic Surgery

## 2016-12-23 ENCOUNTER — Ambulatory Visit: Payer: Medicare Other

## 2017-01-11 ENCOUNTER — Ambulatory Visit (INDEPENDENT_AMBULATORY_CARE_PROVIDER_SITE_OTHER): Payer: Medicare Other

## 2017-01-11 ENCOUNTER — Encounter (INDEPENDENT_AMBULATORY_CARE_PROVIDER_SITE_OTHER): Payer: Self-pay | Admitting: Orthopedic Surgery

## 2017-01-11 ENCOUNTER — Ambulatory Visit (INDEPENDENT_AMBULATORY_CARE_PROVIDER_SITE_OTHER): Payer: Medicare Other | Admitting: Orthopedic Surgery

## 2017-01-11 ENCOUNTER — Ambulatory Visit (INDEPENDENT_AMBULATORY_CARE_PROVIDER_SITE_OTHER): Payer: Self-pay

## 2017-01-11 VITALS — BP 126/70 | HR 65 | Resp 65 | Ht 64.0 in | Wt 251.0 lb

## 2017-01-11 DIAGNOSIS — M25511 Pain in right shoulder: Secondary | ICD-10-CM | POA: Diagnosis not present

## 2017-01-11 DIAGNOSIS — M545 Low back pain: Secondary | ICD-10-CM

## 2017-01-11 NOTE — Progress Notes (Signed)
Office Visit Note   Patient: [name]           Date of Birth: 1955/03/26           MRN: [id number] Visit Date: 01/11/2017              Requested by: Shirline Frees, MD [place], [place] [address] PCP: Shirline Frees, MD   Assessment & Plan: Visit Diagnoses:  1. Low back pain, unspecified back pain laterality, unspecified chronicity, with sciatica presence unspecified   2. Right shoulder pain, unspecified chronicity     Plan: #1:  steroid injection subacromial space of the right shoulder was performed #2: We will see her back in about 2 weeks and see if this is made any benefit for her shoulder and to decide whether this is a separate entity of her cervical spine. #3: May need to consider MRI scanning of the right shoulder or the cervical spine. #4: Certainly we could reevaluate also her thoracic spine and   .Marland KitchenMarland KitchenI would like that a T-spine .Marland KitchenMarland KitchenMarland KitchenA-P and lateral to be  obtained at the time of her     visit. . Face-to-face time spent with patient was greater than 40 minutes.  Greater than 50% of the time was spent in counseling and coordination of care.  Follow-Up Instructions: Return in about 2 weeks (around 01/25/2017).   Orders:  Orders Placed This Encounter  Procedures  . XR Lumbar Spine 2-3 Views  . XR Shoulder Right  . MR Cervical Spine w/o contrast   No orders of the defined types were placed in this encounter.     Procedures: Large Joint Inj: R subacromial bursa on 01/12/2017 3:05 PM Indications: pain and diagnostic evaluation Details: 25 G 1.5 in needle, anterolateral approach  Arthrogram: No  Medications: 2 mL bupivacaine 0.5 %; 2 mL lidocaine 2 %; 80 mg methylPREDNISolone acetate 40 MG/ML Consent was given by the patient. Immediately prior to procedure a time out was called to verify the correct patient, procedure, equipment, support staff and site/side marked as required. Patient was prepped and draped in the usual sterile  fashion.       Clinical Data: No additional findings.   Subjective: Chief Complaint  Patient presents with  . Neck - Pain, Numbness    Patient has had a CT Scan which shows bulging disc  . Spine - Pain    On the right side and nerve pain, burning and tingling   . Right Knee - Pain  . Knee Pain    Bil knee pain x 4 months, no injury, difficulty walking, giving out, weakness, grinding noises, difficulty sleeping at night, TKR BIL  . Back Pain    C-spine, T-spine, L-spine pain x 5 months, no injury, no back surgery, tingling, burning, difficulty sleeping  . Neck Pain    Neck pain x 5 months, no injury, no surgery, limited range of motion  . Shoulder Pain    Right shoulder pain x 1 day, no injury, no surgery, not diabetic, limited range of motion    HPI  [name] is seen today for evaluation of a myriad of complaints. She has a lot of symptoms of the cervical spine with the pain discomfort on the right upper extremity. She also some pain burning and tingling. She has the entire spine pain which she states is within the past 5 months. She's had no injury or back surgery. She does have some tingling. She has problems sleeping. She  is also known to have right shoulder symptoms. She states that this is only one day in duration. However these may be very similar to what she was having in regards to her cervical spine symptoms. She denies any history of injury or trauma. Seen today for evaluation.  Review of Systems  Constitutional: Positive for activity change and fatigue.  HENT: Negative for trouble swallowing.   Eyes: Negative for pain.  Respiratory: Negative for shortness of breath.   Cardiovascular: Negative for leg swelling.  Gastrointestinal: Positive for constipation.  Endocrine: Negative for cold intolerance.  Genitourinary: Negative for difficulty urinating.  Musculoskeletal: Positive for back pain, gait problem, joint swelling, neck pain and neck stiffness.  Skin: Negative for  rash.  Allergic/Immunologic: Negative for food allergies.  Neurological: Positive for weakness and light-headedness.  Hematological: Does not bruise/bleed easily.  Psychiatric/Behavioral: Positive for sleep disturbance.     Objective: Vital Signs: BP 126/70 (BP Location: Left Arm, Patient Position: Sitting, Cuff Size: Small)   Pulse 65   Resp (!) 65   Ht _0  (1.626 m)   Wt 251 lb (113.9 kg)   BMI 43.08 kg/m   Physical Exam  Constitutional: She is oriented to person, place, and time. She appears well-developed and well-nourished.  HENT:  Head: Normocephalic and atraumatic.  Eyes: EOM are normal. Pupils are equal, round, and reactive to light.  Pulmonary/Chest: Effort normal.  Neurological: She is alert and oriented to person, place, and time.  Skin: Skin is warm and dry.  Psychiatric: She has a normal mood and affect. Her behavior is normal. Judgment and thought content normal.    Ortho Exam  Exam today reveals positive empty can test of the right shoulder. She is limited in her abduction shy of about 20-30. She does have some weakness with resistance against external rotation with the elbow at her side. Good strength with internal rotation and abduction. She appears neurovascular intact distally to light touch. She does have some limitation in the range of motion of her cervical spine. No pain to palpation along the cervical spine. Good strength in abduction as well as internal and external rotation of the shoulder.  Specialty Comments:  No specialty comments available.  Imaging: Xr Lumbar Spine 2-3 Views  Result Date: 01/12/2017 Two-view x-ray of the lumbar spine reveals superiors spurring and inferior spurring anteriorly of L2, 3, 4, 5. She does have some narrowing of the L1 vertebrae whether this is part of an old compression fracture cannot tell. T10-T11 reveals decreased vertebral height comparison to the others.  Xr Shoulder Right  Result Date: 01/11/2017 Right  shoulder reveals before meals joint space narrowing with some spurring. Also has some periarticular spurring on the glenoid. Type II acromion almost a 3.    PMFS History: Patient Active Problem List   Diagnosis Date Noted  . OSA (obstructive sleep apnea) 08/27/2014  . Osteoarthritis of left knee 10/17/2013  . 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 Hyperlipemia 08/05/2013  . Palpitations 08/05/2013  . HTN (hypertension) 08/05/2013  . 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 Preoperative cardiovascular examination 08/05/2013  . Degenerative arthritis of knee, bilateral 08/05/2013  . Painful total knee replacement ([facility]) 11/03/2011  . Obesity, Class III, BMI 40-49.9 (morbid obesity) ([facility]) 11/03/2011  . Hypothyroid 11/03/2011   Past Medical History:  Diagnosis Date  . Anginal pain ([facility])    "on and off" (11/01/2011) - anxiety related  . Anxiety    takes  Paxil daily and Xanax prn  . Arthritis    "knees" (10/16/2013)  . Chronic back pain    facet disease and bulging disc; "thoracic and lower back" (10/16/2013)  . Constipation    r/t pain meds;takes stool softener daily  . Depression   . Dysrhythmia    rapid HR on occasion-takes Propranolol daily  . Family history of anesthesia complication    ":PONV; mom and sisters"  . GERD (gastroesophageal reflux disease)    takes Prilosec   . H/O hiatal hernia   . History of bladder infections    > 5yrago  . Hyperlipidemia    takes LIpitor daily  . Hypothyroidism    takes Synthroid daily  . Insomnia   . Joint pain   . Joint swelling   . Kidney stones   . Muscle spasms of head or neck    lumbar and thoracic;takes Robaxin prn  . Peripheral neuropathy   . PONV (postoperative nausea and vomiting)   . Rosacea   . Seasonal allergies    takes allegra prn    Family History  Problem Relation Age of Onset  . Heart disease Mother 766 . Heart failure Mother   . Heart attack  Father   . Heart disease Father 561   Past Surgical History:  Procedure Laterality Date  . ABDOMINAL HYSTERECTOMY  1980's  . ANAL FISSURE REPAIR  ~ 2011   "banded hemorrhoids at this time too"  . BARIATRIC SURGERY    . CARDIAC CATHETERIZATION  ~ 1999  . COLONOSCOPY    . ESOPHAGOGASTRODUODENOSCOPY    . JOINT REPLACEMENT     left knee  . KNEE ARTHROPLASTY Left 2007  . KNEE ARTHROSCOPY Right   . KNEE ARTHROSCOPY Right    "torn meniscus"  . KNEE SURGERY Left 1982-2013   "17 before 10/31/2013"  . LEFT OOPHORECTOMY  1980's  . PATELLECTOMY Right   . PLANTAR FASCIA SURGERY Bilateral 1990's  . REVISION TOTAL KNEE ARTHROPLASTY Left 11/01/2011  . TOTAL KNEE ARTHROPLASTY Right 10/15/2013   Procedure: RIGHT TOTAL KNEE ARTHROPLASTY;  Surgeon: PGarald Balding MD;  Location: [facility]  Service: Orthopedics;  Laterality: Right;  . TOTAL KNEE REVISION  11/01/2011   Procedure: TOTAL KNEE REVISION;  Surgeon: PGarald Balding MD;  Location: [facility]  Service: Orthopedics;  Laterality: Left;  Revision Left Total Knee Replacement   . TUBAL LIGATION  1981   Social History   Occupational History  . Not on file  Tobacco Use  . Smoking status: Never Smoker  . Smokeless tobacco: Never Used  Substance and Sexual Activity  . Alcohol use: No  . Drug use: No  . Sexual activity: Yes    Birth control/protection: Surgical

## 2017-01-11 NOTE — Progress Notes (Signed)
Office Visit Note   Patient: [name]           Date of Birth: 24-Jan-1955           MRN: [id number] Visit Date: 01/11/2017              Requested by: Shirline Frees, MD [place], [place] [address] PCP: Shirline Frees, MD   Assessment & Plan: Visit Diagnoses:  1. Low back pain, unspecified back pain laterality, unspecified chronicity, with sciatica presence unspecified   2. Right shoulder pain, unspecified chronicity     Plan:  #1 obtain T-spine films upon her arrival at her next visit  Follow-Up Instructions: No Follow-up on file.   Orders:  Orders Placed This Encounter  Procedures  . XR Lumbar Spine 2-3 Views  . XR Shoulder Right   No orders of the defined types were placed in this encounter.     Procedures: No procedures performed   Clinical Data: No additional findings.   Subjective: Chief Complaint  Patient presents with  . Neck - Pain, Numbness    Patient has had a CT Scan which shows bulging disc  . Spine - Pain    On the right side and nerve pain, burning and tingling   . Right Knee - Pain  . Knee Pain    Bil knee pain x 4 months, no injury, difficulty walking, giving out, weakness, grinding noises, difficulty sleeping at night, TKR BIL  . Back Pain    C-spine, T-spine, L-spine pain x 5 months, no injury, no back surgery, tingling, burning, difficulty sleeping  . Neck Pain    Neck pain x 5 months, no injury, no surgery, limited range of motion  . Shoulder Pain    Right shoulder pain x 1 day, no injury, no surgery, not diabetic, limited range of motion    HPI    Review of Systems   Objective: Vital Signs: BP 126/70 (BP Location: Left Arm, Patient Position: Sitting, Cuff Size: Small)   Pulse 65   Resp (!) 65   Ht 5\' 4"  (1.626 m)   Wt 251 lb (113.9 kg)   BMI 43.08 kg/m   Physical Exam  Ortho Exam  Deep tendon reflexes in the upper lower show meters were 2+ bilateral symmetric. She had good strength in  the upper extremities though she had the weakness against external rotation with the elbow at her side on the right. Left was normal. She did have positive impingement sign on both shoulders and forward flexion. He did have decreased abduction on the right in comparison to the left shoulder up.   Specialty Comments:  No specialty comments available.  Imaging: Xr Shoulder Right  Result Date: 01/11/2017 Right shoulder reveals before meals joint space narrowing with some spurring. Also has some periarticular spurring on the glenoid. Type II acromion almost a 3.    PMFS History: Patient Active Problem List   Diagnosis Date Noted  . OSA (obstructive sleep apnea) 08/27/2014  . Osteoarthritis of left knee 10/17/2013  . 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 Hyperlipemia 08/05/2013  . Palpitations 08/05/2013  . HTN (hypertension) 08/05/2013  . 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 Preoperative cardiovascular examination 08/05/2013  . Degenerative arthritis of knee, bilateral 08/05/2013  . Painful total knee replacement ([facility]) 11/03/2011  . Obesity, Class III, BMI 40-49.9 (morbid obesity) ([facility]) 11/03/2011  . Hypothyroid 11/03/2011   Past Medical  History:  Diagnosis Date  . Anginal pain ([facility])    "on and off" (11/01/2011) - anxiety related  . Anxiety    takes Paxil daily and Xanax prn  . Arthritis    "knees" (10/16/2013)  . Chronic back pain    facet disease and bulging disc; "thoracic and lower back" (10/16/2013)  . Constipation    r/t pain meds;takes stool softener daily  . Depression   . Dysrhythmia    rapid HR on occasion-takes Propranolol daily  . Family history of anesthesia complication    ":PONV; mom and sisters"  . GERD (gastroesophageal reflux disease)    takes Prilosec   . H/O hiatal hernia   . History of bladder infections    > 88yr ago  . Hyperlipidemia    takes LIpitor daily  . Hypothyroidism     takes Synthroid daily  . Insomnia   . Joint pain   . Joint swelling   . Kidney stones   . Muscle spasms of head or neck    lumbar and thoracic;takes Robaxin prn  . Peripheral neuropathy   . PONV (postoperative nausea and vomiting)   . Rosacea   . Seasonal allergies    takes allegra prn    Family History  Problem Relation Age of Onset  . Heart disease Mother 45  . Heart failure Mother   . Heart attack Father   . Heart disease Father 50    Past Surgical History:  Procedure Laterality Date  . ABDOMINAL HYSTERECTOMY  1980's  . ANAL FISSURE REPAIR  ~ 2011   "banded hemorrhoids at this time too"  . BARIATRIC SURGERY    . CARDIAC CATHETERIZATION  ~ 1999  . COLONOSCOPY    . ESOPHAGOGASTRODUODENOSCOPY    . JOINT REPLACEMENT     left knee  . KNEE ARTHROPLASTY Left 2007  . KNEE ARTHROSCOPY Right   . KNEE ARTHROSCOPY Right    "torn meniscus"  . KNEE SURGERY Left 1982-2013   "17 before 10/31/2013"  . LEFT OOPHORECTOMY  1980's  . PATELLECTOMY Right   . PLANTAR FASCIA SURGERY Bilateral 1990's  . REVISION TOTAL KNEE ARTHROPLASTY Left 11/01/2011  . TOTAL KNEE ARTHROPLASTY Right 10/15/2013   Procedure: RIGHT TOTAL KNEE ARTHROPLASTY;  Surgeon: Garald Balding, MD;  Location: [facility];  Service: Orthopedics;  Laterality: Right;  . TOTAL KNEE REVISION  11/01/2011   Procedure: TOTAL KNEE REVISION;  Surgeon: Garald Balding, MD;  Location: [facility];  Service: Orthopedics;  Laterality: Left;  Revision Left Total Knee Replacement   . TUBAL LIGATION  1981   Social History   Occupational History  . Not on file  Tobacco Use  . Smoking status: Never Smoker  . Smokeless tobacco: Never Used  Substance and Sexual Activity  . Alcohol use: No  . Drug use: No  . Sexual activity: Yes    Birth control/protection: Surgical

## 2017-01-12 DIAGNOSIS — M25511 Pain in right shoulder: Secondary | ICD-10-CM

## 2017-01-12 MED ORDER — LIDOCAINE HCL 2 % IJ SOLN
2.0000 mL | INTRAMUSCULAR | Status: AC | PRN
  Administered 2017-01-12: 2 mL

## 2017-01-12 MED ORDER — METHYLPREDNISOLONE ACETATE 40 MG/ML IJ SUSP
80.0000 mg | INTRAMUSCULAR | Status: AC | PRN
  Administered 2017-01-12: 80 mg

## 2017-01-12 MED ORDER — BUPIVACAINE HCL 0.5 % IJ SOLN
2.0000 mL | INTRAMUSCULAR | Status: AC | PRN
  Administered 2017-01-12: 2 mL via INTRA_ARTICULAR

## 2017-01-16 ENCOUNTER — Telehealth (INDEPENDENT_AMBULATORY_CARE_PROVIDER_SITE_OTHER): Payer: Self-pay | Admitting: Orthopaedic Surgery

## 2017-01-16 NOTE — Telephone Encounter (Signed)
Do I need to do anything on this? Thanks, Marcie Bal

## 2017-01-16 NOTE — Telephone Encounter (Signed)
Patient states she has MRI for Cspine tomorrow 1/15. Patient spoke with insurance company, and they will cover Cspine and shoulder Mri at the same time. Due to extreme Claustrophobia patient would like to have both done at the same time. Patient also needs a rx for Valium to be called into [facility] in [place].

## 2017-01-17 ENCOUNTER — Ambulatory Visit
Admission: RE | Admit: 2017-01-17 | Discharge: 2017-01-17 | Disposition: A | Discharge status: Home / Self Care | Payer: Medicare Other | Source: Ambulatory Visit | Attending: Orthopedic Surgery | Admitting: Orthopedic Surgery

## 2017-01-17 DIAGNOSIS — M25511 Pain in right shoulder: Secondary | ICD-10-CM

## 2017-01-20 ENCOUNTER — Ambulatory Visit
Admission: RE | Admit: 2017-01-20 | Discharge: 2017-01-20 | Disposition: A | Discharge status: Home / Self Care | Payer: Medicare Other | Source: Ambulatory Visit | Attending: Family Medicine | Admitting: Family Medicine

## 2017-01-20 DIAGNOSIS — Z1231 Encounter for screening mammogram for malignant neoplasm of breast: Secondary | ICD-10-CM

## 2017-01-25 ENCOUNTER — Ambulatory Visit (INDEPENDENT_AMBULATORY_CARE_PROVIDER_SITE_OTHER): Payer: Medicare Other | Admitting: Orthopedic Surgery

## 2017-01-25 VITALS — BP 126/75 | HR 65 | Temp 97.9°F | Ht 64.0 in | Wt 238.0 lb

## 2017-01-25 DIAGNOSIS — M542 Cervicalgia: Secondary | ICD-10-CM | POA: Diagnosis not present

## 2017-01-25 DIAGNOSIS — M47812 Spondylosis without myelopathy or radiculopathy, cervical region: Secondary | ICD-10-CM

## 2017-01-25 DIAGNOSIS — M792 Neuralgia and neuritis, unspecified: Secondary | ICD-10-CM | POA: Diagnosis not present

## 2017-01-25 NOTE — Progress Notes (Signed)
Office Visit Note   Patient: [name]           Date of Birth: 06-Sep-1955           MRN: [id number] Visit Date: 01/25/2017              Requested by: Shirline Frees, MD [place], [place] [address] PCP: Shirline Frees, MD   Assessment & Plan: Visit Diagnoses:  1. Cervical spine arthritis with nerve pain   2. Neck pain   3. Cervicalgia   4.      Cervical spondylosis with predominantly discogenic degenerative changes at C5-6 and C6-7 levels.  5.      C5-6 there is mild left foraminal and mild canal stenosis.  6.      C6-7 there is moderate right foraminal and mild canal stenosis. Right subarticular disc protrusion contacts the right anterior cord and may effect exiting C7 nerve roots.  Plan:  #1: Our plan would be to proceed with evaluation by an interventional radiologist for possible cervical injections. We will schedule saw the office. #2:  If this is not beneficial she can return and then we'll need to consider possible evaluation by neurosurgery.  Follow-Up Instructions: No Follow-up on file.   Face-to-face time spent with patient was greater than 30 minutes.  Greater than 50% of the time was spent in counseling and coordination of care.  Orders:  Orders Placed This Encounter  Procedures  . DG INJECT DIAG/THERA/INC NEEDLE/CATH/PLC EPI/CERV/THOR W/IMG   No orders of the defined types were placed in this encounter.     Procedures: No procedures performed   Clinical Data: No additional findings.   Subjective: Chief Complaint  Patient presents with  . Follow-up    MRI C SPINE FU    HPI  And is a very pleasant 62 year old white female who is seen today for evaluation and review of an MRI scan. The majority of her symptoms weren't cervical spine with pain discomfort in the right upper extremity. Also has some burning pain and tingling in the arm. She has the entire spine pain which she states is the been within the last 5 months.  She does have occasional tingling in the upper extremities. He is having problems sleeping. She does complain of some right shoulder symptoms and a corticosteroid injection was placed without a lot of benefit and therefore that was the reason for ordering an MRI scan cervical spine.. Seen today for review of MRI scan.   Review of Systems  Constitutional: Positive for fatigue.  HENT: Negative.   Eyes: Negative.   Respiratory: Negative.   Cardiovascular: Negative.   Gastrointestinal: Negative.   Genitourinary: Negative.   Musculoskeletal: Positive for back pain, myalgias, neck pain and neck stiffness.  Skin: Negative.   Neurological: Positive for weakness and numbness.  Psychiatric/Behavioral: Positive for sleep disturbance.     Objective: Vital Signs: BP 126/75 (BP Location: Right Arm, Patient Position: Sitting, Cuff Size: Normal)   Pulse 65   Temp 97.9 F (36.6 C)   Ht 5\' 4"  (1.626 m)   Wt 238 lb (108 kg)   BMI 40.85 kg/m   Physical Exam  Constitutional: She is oriented to person, place, and time. She appears well-developed and well-nourished.  HENT:  Head: Normocephalic and atraumatic.  Eyes: EOM are normal. Pupils are equal, round, and reactive to light.  Pulmonary/Chest: Effort normal.  Neurological: She is alert and oriented to person, place, and time.  Skin: Skin  is warm and dry.  Psychiatric: She has a normal mood and affect. Her behavior is normal. Judgment and thought content normal.    Ortho Exam  She does have some limitations in range of motion of her cervical spine but does not have any pain to palpation over the cervical spine. She's good. Strength in the shoulder with internal/external rotation. She is intact neurovascularly in the upper extremities. She does have positive empty can test right shoulder. She's limited in her abduction the shy about 20-30.  Specialty Comments:  No specialty comments available.  Imaging: Mr Cervical Spine W/o  Contrast  Result Date: 01/18/2017 CLINICAL DATA:  62 y/o F; right-sided neck pain radiating into the right shoulder, arm, and hands with weakness and numbness. EXAM: MRI CERVICAL SPINE WITHOUT CONTRAST TECHNIQUE: Multiplanar, multisequence MR imaging of the cervical spine was performed. No intravenous contrast was administered. COMPARISON:  01/06/2016 CT of the cervical spine. FINDINGS: Alignment: Physiologic. Vertebrae: No fracture, evidence of discitis, or bone lesion. Cord: Normal signal and morphology. Posterior Fossa, vertebral arteries, paraspinal tissues: Negative. Disc levels: C2-3: No significant disc displacement, foraminal stenosis, or canal stenosis. C3-4: No significant disc displacement, foraminal stenosis, or canal stenosis. C4-5: No significant disc displacement, foraminal stenosis, or canal stenosis. C5-6: Disc osteophyte complex with bilateral uncovertebral and facet hypertrophy greater on the left. Mild left foraminal and canal stenosis. C6-7: Disc osteophyte complex with 4 mm right subarticular disc protrusion and right greater than left uncovertebral/facet hypertrophy. Moderate right foraminal stenosis and mild canal stenosis. Disc contact on the right anterior cord with minimal flattening. C7-T1: No significant disc displacement, foraminal stenosis, or canal stenosis. IMPRESSION: 1. No acute osseous abnormality or abnormal cord signal. 2. Cervical spondylosis with predominantly discogenic degenerative changes at C5-6 and C6-7 levels. 3. At C5-6 there is mild left foraminal and mild canal stenosis. 4. At C6-7 there is moderate right foraminal and mild canal stenosis. Right subarticular disc protrusion contacts the right anterior cord and may effect exiting C7 nerve roots. Electronically Signed   By: Kristine Garbe M.D.   On: 01/18/2017 02:31     PMFS History: Patient Active Problem List   Diagnosis Date Noted  . OSA (obstructive sleep apnea) 08/27/2014  . Osteoarthritis of  left knee 10/17/2013  . 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 Hyperlipemia 08/05/2013  . Palpitations 08/05/2013  . HTN (hypertension) 08/05/2013  . 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 Preoperative cardiovascular examination 08/05/2013  . Degenerative arthritis of knee, bilateral 08/05/2013  . Painful total knee replacement ([facility]) 11/03/2011  . Obesity, Class III, BMI 40-49.9 (morbid obesity) ([facility]) 11/03/2011  . Hypothyroid 11/03/2011   Past Medical History:  Diagnosis Date  . Anginal pain ([facility])    "on and off" (11/01/2011) - anxiety related  . Anxiety    takes Paxil daily and Xanax prn  . Arthritis    "knees" (10/16/2013)  . Chronic back pain    facet disease and bulging disc; "thoracic and lower back" (10/16/2013)  . Constipation    r/t pain meds;takes stool softener daily  . Depression   . Dysrhythmia    rapid HR on occasion-takes Propranolol daily  . Family history of anesthesia complication    ":PONV; mom and sisters"  . GERD (gastroesophageal reflux disease)    takes Prilosec   . H/O hiatal hernia   . History of bladder infections    > 57yr ago  . Hyperlipidemia    takes  LIpitor daily  . Hypothyroidism    takes Synthroid daily  . Insomnia   . Joint pain   . Joint swelling   . Kidney stones   . Muscle spasms of head or neck    lumbar and thoracic;takes Robaxin prn  . Peripheral neuropathy   . PONV (postoperative nausea and vomiting)   . Rosacea   . Seasonal allergies    takes allegra prn    Family History  Problem Relation Age of Onset  . Heart disease Mother 63  . Heart failure Mother   . Heart attack Father   . Heart disease Father 32    Past Surgical History:  Procedure Laterality Date  . ABDOMINAL HYSTERECTOMY  1980's  . ANAL FISSURE REPAIR  ~ 2011   "banded hemorrhoids at this time too"  . BARIATRIC SURGERY    . CARDIAC CATHETERIZATION  ~ 1999  . COLONOSCOPY    .  ESOPHAGOGASTRODUODENOSCOPY    . JOINT REPLACEMENT     left knee  . KNEE ARTHROPLASTY Left 2007  . KNEE ARTHROSCOPY Right   . KNEE ARTHROSCOPY Right    "torn meniscus"  . KNEE SURGERY Left 1982-2013   "17 before 10/31/2013"  . LEFT OOPHORECTOMY  1980's  . PATELLECTOMY Right   . PLANTAR FASCIA SURGERY Bilateral 1990's  . REVISION TOTAL KNEE ARTHROPLASTY Left 11/01/2011  . TOTAL KNEE ARTHROPLASTY Right 10/15/2013   Procedure: RIGHT TOTAL KNEE ARTHROPLASTY;  Surgeon: Garald Balding, MD;  Location: [facility];  Service: Orthopedics;  Laterality: Right;  . TOTAL KNEE REVISION  11/01/2011   Procedure: TOTAL KNEE REVISION;  Surgeon: Garald Balding, MD;  Location: [facility];  Service: Orthopedics;  Laterality: Left;  Revision Left Total Knee Replacement   . TUBAL LIGATION  1981   Social History   Occupational History  . Not on file  Tobacco Use  . Smoking status: Never Smoker  . Smokeless tobacco: Never Used  Substance and Sexual Activity  . Alcohol use: No  . Drug use: No  . Sexual activity: Yes    Birth control/protection: Surgical

## 2017-01-26 ENCOUNTER — Encounter: Payer: Self-pay | Admitting: Skilled Nursing Facility1

## 2017-01-26 ENCOUNTER — Encounter: Payer: Medicare Other | Attending: Family Medicine | Admitting: Skilled Nursing Facility1

## 2017-01-26 ENCOUNTER — Encounter (INDEPENDENT_AMBULATORY_CARE_PROVIDER_SITE_OTHER): Payer: Self-pay | Admitting: Orthopedic Surgery

## 2017-01-26 DIAGNOSIS — Z713 Dietary counseling and surveillance: Secondary | ICD-10-CM | POA: Insufficient documentation

## 2017-01-26 DIAGNOSIS — Z6841 Body Mass Index (BMI) 40.0 and over, adult: Secondary | ICD-10-CM | POA: Insufficient documentation

## 2017-01-26 NOTE — Progress Notes (Signed)
  Assessment:  Primary concerns today: referred for obesity.  Pt states she had the sleeve March 21, 2015 through a practice of [facility]. Pt state she would lie to lose weight. Pt states she got to 214 pounds and was there for 4 months and then increased from there due to hypothyroidism. Pt states she eats until before she is about to throw up. Pt state she is often trying to squeeze in a few more bites. Pt states her weight pre-surgery was 299. Pt states at 214 pounds she felt really good. Pt states she has had 3 knee replacements and her knees are starting to hurt again. Pt states she is taking gummies as a multivitamin, b complex, vitamin d. Pt states she has developed nerve pain in her arms and legs stating this is due to a protruding disc. Pt states between 3pm and 2am she overeats. 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 Pt states she has chronic pain which inhibits her from sleeping. Pt states she has to be down in her weight before she sees her surgeon in march otherrwise she feels she has failed. Pt states she started working with a therapist on her chronic pain causing the overeating at night (using food to find relief).    MEDICATIONS: See List   DIETARY INTAKE:  Usual eating pattern includes 3 meals and 3 snacks per day.  Everyday foods include none stated.  Avoided foods include none stated.    24-hr recall:  B ( AM): coffee with sugar creamer Snk ( AM): premier protein shake or scrambled egg with 3 slices of bacon L ( PM): cheese stick  Snk ( PM): soup or grilled cheese  D ( PM): chicken or pork with green beans or broccoli and cheese or zucchini and salad Snk ( PM): 10  molasses cookies dipped in coffee or potato chips and dip, pretzel and dip, and popcorn or crackers Beverages: decaf coffee with creamer (32oz), water with flavorings, sugar free drink (24oz): 56oz  Usual physical activity: recumbent bike intermittently 3 days a week 10 minutes in the morning and 10 minutes in  the evening   Estimated energy needs: 1500 calories 170 g carbohydrates 112 g protein 42 g fat  Progress Towards Goal(s):  In progress.   Nutritional Diagnosis:  [place]-3.3 Overweight/obesity related to past poor dietary habits and physical inactivity as evidenced by patient w/ sleeve surgery following dietary guidelines for continued weight loss.    Intervention:  Nutrition counseling. Goals: -Find some sugar free creamer maybe coffee mate and do 1 tablespoon at a time to taste  -Identify satisfied verses full -Create new neural pathways -Create complete balanced meals throughout the day  Given: Meal ideas sheet Should I eat sheet  Teaching Method Utilized:  Visual Auditory Hands on  Barriers to learning/adherence to lifestyle change: emotional eating  Demonstrated degree of understanding via:  Teach Back   Monitoring/Evaluation:  Dietary intake, exercise,and body weight prn.

## 2017-01-26 NOTE — Patient Instructions (Addendum)
-  Find some sugar free creamer maybe coffee mate and do 1 tablespoon at a time to taste   -Identify satisfied verses full  -Create new neural pathways  -Create complete balanced meals throughout the day

## 2017-02-01 ENCOUNTER — Telehealth (INDEPENDENT_AMBULATORY_CARE_PROVIDER_SITE_OTHER): Payer: Self-pay | Admitting: Orthopaedic Surgery

## 2017-02-01 NOTE — Telephone Encounter (Signed)
Patient would like to be referred to a neurologist (spoke with Aaron Edelman about it at last office visit).  Patient stated that she doesn't know anyone specific but would be happy with anyone that he recommends.

## 2017-02-02 NOTE — Telephone Encounter (Signed)
Aaron Edelman has ordered and she has appt on 02-07-17.

## 2017-02-02 NOTE — Telephone Encounter (Signed)
LVMOM that I would need to verify with Aaron Edelman. LVMOM for Aaron Edelman to let me know what to do.

## 2017-02-02 NOTE — Telephone Encounter (Signed)
Patient calling again to check on status of possible referral. Please call patient with name of Dr., Dr. Durward Fortes would recommend.

## 2017-02-07 ENCOUNTER — Ambulatory Visit
Admission: RE | Admit: 2017-02-07 | Discharge: 2017-02-07 | Disposition: A | Discharge status: Home / Self Care | Payer: Medicare Other | Source: Ambulatory Visit | Attending: Orthopedic Surgery | Admitting: Orthopedic Surgery

## 2017-02-07 DIAGNOSIS — M542 Cervicalgia: Secondary | ICD-10-CM

## 2017-02-07 MED ORDER — IOPAMIDOL (ISOVUE-M 300) INJECTION 61%
1.0000 mL | Freq: Once | INTRAMUSCULAR | Status: AC | PRN
  Administered 2017-02-07: 1 mL via EPIDURAL

## 2017-02-07 MED ORDER — TRIAMCINOLONE ACETONIDE 40 MG/ML IJ SUSP (RADIOLOGY)
60.0000 mg | Freq: Once | INTRAMUSCULAR | Status: AC
  Administered 2017-02-07: 60 mg via EPIDURAL

## 2017-02-07 NOTE — Discharge Instructions (Signed)

## 2017-02-15 ENCOUNTER — Telehealth (INDEPENDENT_AMBULATORY_CARE_PROVIDER_SITE_OTHER): Payer: Self-pay | Admitting: Orthopaedic Surgery

## 2017-02-15 NOTE — Telephone Encounter (Signed)
Please schedule

## 2017-02-15 NOTE — Telephone Encounter (Signed)
MRI performed 02/07/2017. Unable to see impressions

## 2017-02-15 NOTE — Telephone Encounter (Signed)
Patient calling to let you know that she had Cspine Epidural done 8 days ago. Patient received no relief with that injection. Patient questions what to do next. Please call to advise.

## 2017-02-15 NOTE — Telephone Encounter (Signed)
Have her make appt with Dr Durward Fortes to evaluate

## 2017-02-17 ENCOUNTER — Ambulatory Visit (INDEPENDENT_AMBULATORY_CARE_PROVIDER_SITE_OTHER): Payer: Medicare Other | Admitting: Orthopaedic Surgery

## 2017-02-17 ENCOUNTER — Encounter (INDEPENDENT_AMBULATORY_CARE_PROVIDER_SITE_OTHER): Payer: Self-pay | Admitting: Orthopaedic Surgery

## 2017-02-17 DIAGNOSIS — M25511 Pain in right shoulder: Secondary | ICD-10-CM

## 2017-02-17 DIAGNOSIS — G8929 Other chronic pain: Secondary | ICD-10-CM

## 2017-02-17 MED ORDER — METHOCARBAMOL 750 MG PO TABS: 750.0000 mg | ORAL_TABLET | Freq: Two times a day (BID) | ORAL | 0 refills | Status: DC | PRN

## 2017-02-17 NOTE — Progress Notes (Signed)
Office Visit Note   Patient: [name]           Date of Birth: [birth year]           MRN: [id number] Visit Date: 02/17/2017              Requested by: Shirline Frees, MD [address] [place], [place] [address] PCP: Shirline Frees, MD   Assessment & Plan: Visit Diagnoses:  1. Chronic right shoulder pain     Plan: Mrs. Rich is had a chronic problem with her cervical spine and right upper extremity. To date she's had an MRI scan of the cervical spine revealing no acute osseous abnormalities or abnormal cord signal. There is cervical spondylosis with predominantly discogenic degenerative changes at C5-6 and C6-7. C5-6 there was mild left foraminal and mild canal stenosis. At C6-7 there was a moderate right foraminal and mild canal stenosis. Right subarticular disc protrusion contacted the right anterior cord and might affect the exiting C7 nerve roots. She's been seen by the radiologist at [facility] imaging for an injection that only gave her temporary relief. She's also had a problem with her right shoulder. Aaron Edelman injected her shoulder several months ago that only gave her temporary relief of her pain. She continues to have pain in the cervical spine and right upper extremity with burning sensation diffusely. She did see Dr. Leanora Cover months ago and had EMGs and nerve conduction studies.. These were consistent with mild bilateral carpal tunnel syndrome. [name] is also been involved in a chronic pain clinic evaluations with Dr. Hardin Negus. She had been on different medicines. Presently is no longer going to the clinic and is only taking Tylenol in Robaxin. She is just frustrated with her pain and discomfort. I'd like to obtain an MRI scan of her right shoulder and renewed the Robaxin. Plan to see her back shortly after the above stability. I think Mrs. Swalley S2 separate issues. One is with the degenerative changes in the cervical spine. I be worthwhile to consider  further injections are a prior to that I like to obtain an MRI scan of her right shoulder she still has trouble with motion with a poor response to subacromial cortisone injection  Follow-Up Instructions: Return after MRI right shoulder.   Orders:  Orders Placed This Encounter  Procedures  . MR SHOULDER RIGHT WO CONTRAST   No orders of the defined types were placed in this encounter.     Procedures: No procedures performed   Clinical Data: No additional findings.   Subjective: Chief Complaint  Patient presents with  . Neck - Pain    [name] is a 62 y o here for chronic cervical pain that burns constantly. Injection on 02/07/17 @ GI provided no relief.R shoulder to thoracic spine feels Like stabbing pain and radiates down both arms and h hands have tingling/weakness/writing.  Still having a problem with her cervical spine and right upper extremity. Does have some pain in the base of her neck was some referred pain to the interscapular region. Occasion [name] some numbness and tingling. She's been. Previously diagnosed with carpal tunnel syndrome to Dr. Levell July office with EMGs and nerve conduction studies. She also was had some issues with her right shoulder but has not had a good response to a subacromial cortisone injection. She does have trouble with both her neck and her should right shoulder that appear to be separate  HPI  Review of Systems  Constitutional: Negative for  chills, fatigue and fever.  HENT: Positive for hearing loss. Negative for tinnitus.   Eyes: Negative for itching.  Respiratory: Negative for chest tightness and shortness of breath.   Cardiovascular: Negative for chest pain, palpitations and leg swelling.  Gastrointestinal: Negative for blood in stool, constipation and diarrhea.  Endocrine: Negative for polyuria.  Genitourinary: Negative for dysuria.  Musculoskeletal: Positive for back pain, joint swelling, neck pain and neck stiffness.    Allergic/Immunologic: Negative for immunocompromised state.  Neurological: Positive for weakness. Negative for dizziness, numbness and headaches.  Hematological: Does not bruise/bleed easily.  Psychiatric/Behavioral: Positive for sleep disturbance. The patient is not nervous/anxious.      Objective: Vital Signs: BP (!) 149/75   Pulse 90   Resp 16   Ht 5\' 4"  (1.626 m)   Wt 247 lb (112 kg)   BMI 42.40 kg/m   Physical Exam  Ortho Exam awake alert and oriented 3. Comfortable sitting. Mild loss of motion of cervical spine. Can touch her chin to her chest. Little bit of discomfort in the interscapular region without motion. Lacks some degrees to full overhead motion and in extension with some referred pain to her right shoulder and the interscapular region. Some mild limitation of motion to the right and to the left. Positive impingement and empty can testing right shoulder area and biceps intact. Good strength. Skin intact. Some mild discomfort over the median nerves at both wrists consistent with her previous diagnosis of carpal tunnel syndrome. Good grip and good release  Specialty Comments:  No specialty comments available.  Imaging: No results found.   PMFS History: Patient Active Problem List   Diagnosis Date Noted  . OSA (obstructive sleep apnea) 08/27/2014  . Osteoarthritis of left knee 10/17/2013  . 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 Hyperlipemia 08/05/2013  . Palpitations 08/05/2013  . HTN (hypertension) 08/05/2013  . 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 Preoperative cardiovascular examination 08/05/2013  . Degenerative arthritis of knee, bilateral 08/05/2013  . Painful total knee replacement ([place]) 11/03/2011  . Obesity, Class III, BMI 40-49.9 (morbid obesity) ([place]) 11/03/2011  . Hypothyroid 11/03/2011   Past Medical History:  Diagnosis Date  . Anginal pain ([place])    "on and off" (11/01/2011) -  anxiety related  . Anxiety    takes Paxil daily and Xanax prn  . Arthritis    "knees" (10/16/2013)  . Chronic back pain    facet disease and bulging disc; "thoracic and lower back" (10/16/2013)  . Constipation    r/t pain meds;takes stool softener daily  . Depression   . Dysrhythmia    rapid HR on occasion-takes Propranolol daily  . Family history of anesthesia complication    ":PONV; mom and sisters"  . GERD (gastroesophageal reflux disease)    takes Prilosec   . H/O hiatal hernia   . History of bladder infections    > 76yr ago  . Hyperlipidemia    takes LIpitor daily  . Hypothyroidism    takes Synthroid daily  . Insomnia   . Joint pain   . Joint swelling   . Kidney stones   . Muscle spasms of head or neck    lumbar and thoracic;takes Robaxin prn  . Peripheral neuropathy   . PONV (postoperative nausea and vomiting)   . Rosacea   . Seasonal allergies    takes allegra prn    Family History  Problem Relation Age of Onset  . Heart disease  Mother 80  . Heart failure Mother   . Heart attack Father   . Heart disease Father 75    Past Surgical History:  Procedure Laterality Date  . ABDOMINAL HYSTERECTOMY  1980's  . ANAL FISSURE REPAIR  ~ 2011   "banded hemorrhoids at this time too"  . BARIATRIC SURGERY    . CARDIAC CATHETERIZATION  ~ 1999  . COLONOSCOPY    . ESOPHAGOGASTRODUODENOSCOPY    . JOINT REPLACEMENT     left knee  . KNEE ARTHROPLASTY Left 2007  . KNEE ARTHROSCOPY Right   . KNEE ARTHROSCOPY Right    "torn meniscus"  . KNEE SURGERY Left 1982-2013   "17 before 10/31/2013"  . LEFT OOPHORECTOMY  1980's  . PATELLECTOMY Right   . PLANTAR FASCIA SURGERY Bilateral 1990's  . REVISION TOTAL KNEE ARTHROPLASTY Left 11/01/2011  . TOTAL KNEE ARTHROPLASTY Right 10/15/2013   Procedure: RIGHT TOTAL KNEE ARTHROPLASTY;  Surgeon: Garald Balding, MD;  Location: [facility];  Service: Orthopedics;  Laterality: Right;  . TOTAL KNEE REVISION  11/01/2011   Procedure: TOTAL KNEE  REVISION;  Surgeon: Garald Balding, MD;  Location: [facility];  Service: Orthopedics;  Laterality: Left;  Revision Left Total Knee Replacement   . TUBAL LIGATION  1981   Social History   Occupational History  . Not on file  Tobacco Use  . Smoking status: Never Smoker  . Smokeless tobacco: Never Used  Substance and Sexual Activity  . Alcohol use: No  . Drug use: No  . Sexual activity: Yes    Birth control/protection: Surgical

## 2017-02-26 ENCOUNTER — Ambulatory Visit
Admission: RE | Admit: 2017-02-26 | Discharge: 2017-02-26 | Disposition: A | Discharge status: Home / Self Care | Payer: Medicare Other | Source: Ambulatory Visit | Attending: Orthopaedic Surgery | Admitting: Orthopaedic Surgery

## 2017-02-26 DIAGNOSIS — M25511 Pain in right shoulder: Principal | ICD-10-CM

## 2017-02-26 DIAGNOSIS — G8929 Other chronic pain: Secondary | ICD-10-CM

## 2017-02-28 ENCOUNTER — Ambulatory Visit (INDEPENDENT_AMBULATORY_CARE_PROVIDER_SITE_OTHER): Payer: Medicare Other | Admitting: Orthopaedic Surgery

## 2017-02-28 ENCOUNTER — Encounter (INDEPENDENT_AMBULATORY_CARE_PROVIDER_SITE_OTHER): Payer: Self-pay | Admitting: Orthopaedic Surgery

## 2017-02-28 VITALS — Resp 16 | Ht 64.0 in | Wt 247.0 lb

## 2017-02-28 DIAGNOSIS — G8929 Other chronic pain: Secondary | ICD-10-CM

## 2017-02-28 DIAGNOSIS — M25511 Pain in right shoulder: Secondary | ICD-10-CM | POA: Diagnosis not present

## 2017-02-28 NOTE — Progress Notes (Signed)
Office Visit Note   Patient: [name]           Date of Birth: 1955-09-03           MRN: [id number] Visit Date: 02/28/2017              Requested by: Shirline Frees, MD [address] [place], [place] [address] PCP: Shirline Frees, MD   Assessment & Plan: Visit Diagnoses:  1. Chronic right shoulder pain     Plan: MRI scan demonstrates an intrasubstance tear of the supraspinatus. There is no articular or bursal surface tearing. She had moderate degenerative changes at the acromioclavicular joint and the glenohumeral joint. Also has chronic burning in both upper extremities. Had EMGs and nerve conduction studies performed at the neurology clinic 8 months ago. She believes that this demonstrated a mild carpal tunnel. She cannot take gabapentin or Lyrica as she becomes "suicidal". She's had difficulty with NSAIDs. He think that's a separate issue as I don't think this is necessarily caused by her neck or shoulder. I suggested that she check back with her primary care physician for an update. Oldest standpoint I would like her to try some physical therapy. Her son is a physical therapist. She has a copy of the MRI scan and I did give her some suggestions  about therapy. She also has been seeing Dr. Ernestina Patches for her neck with injections.  Follow-Up Instructions: Return if symptoms worsen or fail to improve.   Orders:  No orders of the defined types were placed in this encounter.  No orders of the defined types were placed in this encounter.     Procedures: No procedures performed   Clinical Data: No additional findings.   Subjective: Chief Complaint  Patient presents with  . Right Shoulder - Results    [name] is here today for MRI results.  MRI scan of the right shoulder demonstrated intrasubstance tearing of the supraspinatus. There are degenerative changes at the acromioclavicular joint and glenohumeral joint. [name] is having some difficulty  raising her arm over her head with some referred pain along the anterior posterior and lateral subacromial region. Also has chronic "burning" of both upper extremities. Had EMGs and nerve conduction studies performed about 8 months ago and she relates demonstrated mild carpal tunnel syndrome. Has been followed by her primary care physician. She relates having significant burning of both arms at rest. Has had difficulty with Lyrica and gabapentin in the past. Had a recent MRI of her cervical spine demonstrating moderate right foraminal and mild canal stenosis at C6-7. A right subarticular disc protrusion contacted the right anterior cord and may affect the exiting C7 nerve root. She did have an injection by Dr. Romona Curls wasn't sure that it made it "a difference". She has an appointment for follow-up  HPI  Review of Systems  Constitutional: Negative for chills, fatigue and fever.  HENT: Negative for hearing loss and tinnitus.   Eyes: Negative for itching.  Respiratory: Negative for chest tightness and shortness of breath.   Cardiovascular: Negative for chest pain, palpitations and leg swelling.  Gastrointestinal: Negative for blood in stool, constipation and diarrhea.  Endocrine: Negative for polyuria.  Genitourinary: Negative for dysuria.  Musculoskeletal: Positive for arthralgias and neck stiffness. Negative for back pain, joint swelling and neck pain.  Allergic/Immunologic: Negative for immunocompromised state.  Neurological: Negative for dizziness, numbness and headaches.  Hematological: Does not bruise/bleed easily.  Psychiatric/Behavioral: Negative for sleep disturbance. The patient is  nervous/anxious.      Objective: Vital Signs: Resp 16   Ht 5\' 4"  (1.626 m)   Wt 247 lb (112 kg)   BMI 42.40 kg/m   Physical Exam  Ortho Exam awake alert and oriented 3. Comfortable sitting. Has local tenderness over the acromioclavicular joint right shoulder. Does have some impingement and empty can  testing. Is able to raise her right arm fully over her head. Appears to have good strength. Biceps intact. Good grip and good release. Has subjective burning in both of her arms and hands. She cannot reproduce that burning or exacerbate that discomfort by motion of her cervical spine.   Specialty Comments:  No specialty comments available.  Imaging: No results found.   PMFS History: Patient Active Problem List   Diagnosis Date Noted  . OSA (obstructive sleep apnea) 08/27/2014  . Osteoarthritis of left knee 10/17/2013  . 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 Hyperlipemia 08/05/2013  . Palpitations 08/05/2013  . HTN (hypertension) 08/05/2013  . 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 Preoperative cardiovascular examination 08/05/2013  . Degenerative arthritis of knee, bilateral 08/05/2013  . Painful total knee replacement ([place]) 11/03/2011  . Obesity, Class III, BMI 40-49.9 (morbid obesity) ([place]) 11/03/2011  . Hypothyroid 11/03/2011   Past Medical History:  Diagnosis Date  . Anginal pain ([place])    "on and off" (11/01/2011) - anxiety related  . Anxiety    takes Paxil daily and Xanax prn  . Arthritis    "knees" (10/16/2013)  . Chronic back pain    facet disease and bulging disc; "thoracic and lower back" (10/16/2013)  . Constipation    r/t pain meds;takes stool softener daily  . Depression   . Dysrhythmia    rapid HR on occasion-takes Propranolol daily  . Family history of anesthesia complication    ":PONV; mom and sisters"  . GERD (gastroesophageal reflux disease)    takes Prilosec   . H/O hiatal hernia   . History of bladder infections    > 70yr ago  . Hyperlipidemia    takes LIpitor daily  . Hypothyroidism    takes Synthroid daily  . Insomnia   . Joint pain   . Joint swelling   . Kidney stones   . Muscle spasms of head or neck    lumbar and thoracic;takes Robaxin prn  . Peripheral neuropathy   . PONV  (postoperative nausea and vomiting)   . Rosacea   . Seasonal allergies    takes allegra prn    Family History  Problem Relation Age of Onset  . Heart disease Mother 78  . Heart failure Mother   . Heart attack Father   . Heart disease Father 58    Past Surgical History:  Procedure Laterality Date  . ABDOMINAL HYSTERECTOMY  1980's  . ANAL FISSURE REPAIR  ~ 2011   "banded hemorrhoids at this time too"  . BARIATRIC SURGERY    . CARDIAC CATHETERIZATION  ~ 1999  . COLONOSCOPY    . ESOPHAGOGASTRODUODENOSCOPY    . JOINT REPLACEMENT     left knee  . KNEE ARTHROPLASTY Left 2007  . KNEE ARTHROSCOPY Right   . KNEE ARTHROSCOPY Right    "torn meniscus"  . KNEE SURGERY Left 1982-2013   "17 before 10/31/2013"  . LEFT OOPHORECTOMY  1980's  . PATELLECTOMY Right   . PLANTAR FASCIA SURGERY Bilateral 1990's  . REVISION TOTAL KNEE ARTHROPLASTY Left 11/01/2011  . TOTAL KNEE  ARTHROPLASTY Right 10/15/2013   Procedure: RIGHT TOTAL KNEE ARTHROPLASTY;  Surgeon: Garald Balding, MD;  Location: [facility];  Service: Orthopedics;  Laterality: Right;  . TOTAL KNEE REVISION  11/01/2011   Procedure: TOTAL KNEE REVISION;  Surgeon: Garald Balding, MD;  Location: [facility];  Service: Orthopedics;  Laterality: Left;  Revision Left Total Knee Replacement   . TUBAL LIGATION  1981   Social History   Occupational History  . Not on file  Tobacco Use  . Smoking status: Never Smoker  . Smokeless tobacco: Never Used  Substance and Sexual Activity  . Alcohol use: No  . Drug use: No  . Sexual activity: Yes    Birth control/protection: Surgical

## 2017-03-01 ENCOUNTER — Ambulatory Visit: Payer: Medicare Other | Admitting: Skilled Nursing Facility1

## 2017-03-03 ENCOUNTER — Ambulatory Visit (INDEPENDENT_AMBULATORY_CARE_PROVIDER_SITE_OTHER): Payer: Medicare Other | Admitting: Orthopaedic Surgery

## 2017-03-03 HISTORY — PX: CERVICAL DISC SURGERY: SHX588

## 2017-06-20 ENCOUNTER — Other Ambulatory Visit: Payer: Self-pay | Admitting: Neurosurgery

## 2017-06-20 DIAGNOSIS — M546 Pain in thoracic spine: Secondary | ICD-10-CM

## 2017-06-24 ENCOUNTER — Ambulatory Visit
Admission: RE | Admit: 2017-06-24 | Discharge: 2017-06-24 | Disposition: A | Discharge status: Home / Self Care | Payer: Medicare Other | Source: Ambulatory Visit | Attending: Neurosurgery | Admitting: Neurosurgery

## 2017-06-24 DIAGNOSIS — M546 Pain in thoracic spine: Secondary | ICD-10-CM

## 2017-07-04 ENCOUNTER — Other Ambulatory Visit: Payer: Self-pay | Admitting: Neurosurgery

## 2017-07-04 DIAGNOSIS — M546 Pain in thoracic spine: Secondary | ICD-10-CM

## 2017-07-05 ENCOUNTER — Ambulatory Visit
Admission: RE | Admit: 2017-07-05 | Discharge: 2017-07-05 | Disposition: A | Discharge status: Home / Self Care | Payer: Medicare Other | Source: Ambulatory Visit | Attending: Neurosurgery | Admitting: Neurosurgery

## 2017-07-05 DIAGNOSIS — M546 Pain in thoracic spine: Secondary | ICD-10-CM

## 2017-07-05 MED ORDER — GADOBENATE DIMEGLUMINE 529 MG/ML IV SOLN
20.0000 mL | Freq: Once | INTRAVENOUS | Status: AC | PRN
  Administered 2017-07-05: 20 mL via INTRAVENOUS

## 2017-09-13 HISTORY — PX: CARPAL TUNNEL RELEASE: SHX101

## 2017-10-10 ENCOUNTER — Ambulatory Visit (INDEPENDENT_AMBULATORY_CARE_PROVIDER_SITE_OTHER): Payer: Medicare Other | Admitting: Psychiatry

## 2017-10-10 ENCOUNTER — Encounter: Payer: Self-pay | Admitting: Psychiatry

## 2017-10-10 DIAGNOSIS — F411 Generalized anxiety disorder: Secondary | ICD-10-CM

## 2017-10-10 DIAGNOSIS — F3340 Major depressive disorder, recurrent, in remission, unspecified: Secondary | ICD-10-CM

## 2017-10-10 DIAGNOSIS — F429 Obsessive-compulsive disorder, unspecified: Secondary | ICD-10-CM | POA: Insufficient documentation

## 2017-10-10 DIAGNOSIS — F331 Major depressive disorder, recurrent, moderate: Secondary | ICD-10-CM | POA: Insufficient documentation

## 2017-10-10 NOTE — Progress Notes (Signed)
      [facility] Counselor/Therapist Progress Note   Patient ID: [name], MRN: [id number]  Date: 10/10/2017  Timespent: 50 minutes  4:35p-5:20p  Treatment Type: Individual  Subjective:  Concerns for privacy of records under electronic system.  After last session's revelation about husband's behavior (sexually dystonic, uncomfortable expressions of anger) and her own tendency to shop for comfort, she reluctantly brought up with him the shopping tendency.  Formed a plan with husband to consolidate debt, and two banks turned down for loans based on amount of credit extended.  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  Encouraged by her own experience offering (courage), researching (intellect), meeting new people (socializing), and hearing other's lives (gift of empathy).    Benefit of imagery technique last session trying to remember her own experience of God and commitment/salvation.  Recalled afterwards an experience in early 46s when she prayed over her doubts about being saved and felt a supernatural peace come over her.  Cleansing to confess her shopping to husband,   Says H recently has been starting the morning by pulling off covers, pulling off her night clothing to varying degrees, and standing staring at her, as if at a stripper.    Interventions:CBT, Counsellor and Reframing Educated about new system, rights, and protections.  Agreed to allow her psychiatric diagnoses to be part of the general record and problem list.  Oriented to progressive muscle relaxation for calming, demonstrated on neck and arm muscles while encouraging freer breathing to relax vs. worries expressed in session.  Disc H's unusual morning behavior, validating both the bit of flattery she experiences in being desired and the sense of objectification she experiences with it. Offered assertive options how to  respond.  Mental Status Exam:   Appearance:   Casual     Behavior:  Appropriate and Motivated  Motor:  Normal  Speech/Language:   NA  Affect:  Appropriate, tense/nervous  Mood:  anxious  Thought process:  Relevant and mildly tangential  Thought content:    Logical  Perceptual disturbances:    Normal  Mild hearing deficit  Orientation:  Full (Time, Place, and Person)  Attention:  Good  Concentration:  WNL  Memory:  N/A  Fund of knowledge:   NA  Insight:    Fair  Judgment:   Good  Impulse Control:  good    Reported Symptoms:  Worry, anxiety, intrusive thoughts, compulsions to pray, reluctance to assert  Risk Assessment: Danger to Self:  No Self-injurious Behavior: No Danger to Others: No Duty to Warn:no Physical Aggression / Violence:No  Access to Firearms a concern: No  Gang Involvement:No   Diagnosis:   ICD-10-CM   1. Obsessive-compulsive disorder, unspecified type F42.9   2. Generalized anxiety disorder F41.1   3. Recurrent major depressive disorder, in remission [facility]) F33.40      Plan:  Practice relaxation skill ad lib Attempt at discretion asking husband to refrain from morning behavior, or ask him to consider how it feels in her place Continue to utilize previously learned skills ad lib Maintain medication if prescribed, work with relevant provider Call clinic on-call, present to ER, or 911 if life-threatening emergency    Kristin Serve, PhD

## 2017-10-24 ENCOUNTER — Ambulatory Visit: Payer: Medicare Other | Admitting: Psychiatry

## 2017-10-24 DIAGNOSIS — F3340 Major depressive disorder, recurrent, in remission, unspecified: Secondary | ICD-10-CM

## 2017-10-24 DIAGNOSIS — F429 Obsessive-compulsive disorder, unspecified: Secondary | ICD-10-CM | POA: Diagnosis not present

## 2017-10-24 DIAGNOSIS — F411 Generalized anxiety disorder: Secondary | ICD-10-CM | POA: Diagnosis not present
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
## 2017-11-14 ENCOUNTER — Ambulatory Visit: Payer: Medicare Other | Admitting: Psychiatry

## 2017-11-14 DIAGNOSIS — Z630 Problems in relationship with spouse or partner: Secondary | ICD-10-CM

## 2017-11-14 DIAGNOSIS — F411 Generalized anxiety disorder: Secondary | ICD-10-CM | POA: Diagnosis not present

## 2017-11-14 DIAGNOSIS — F3340 Major depressive disorder, recurrent, in remission, unspecified: Secondary | ICD-10-CM

## 2017-11-14 DIAGNOSIS — F429 Obsessive-compulsive disorder, unspecified: Secondary | ICD-10-CM

## 2017-11-14 NOTE — Progress Notes (Signed)
      [facility] Counselor/Therapist Progress Note   Patient ID: [name] MRN: [id number] Date: 11/14/2017  Treatment Type: Individual Start time: 3:16p Stop time: 4:15p Time Spent: 59 min  Self-report: Daughter (fellow PT) getting relief from last intervention, wants PT to convey appreciation.  Personally, been thinking back over history and interpretation of her F and H as examples of God.  Worry over H's road rage.  Has come to feel that marrying [name] was a "mistake", remembering a former BF [name]. Tired of anal sex as insisted by husband.  Has begun her [place], feeling more experienced and capable and making some money now.  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    Interventions: Cognitive Behavioral Therapy, Assertiveness/Communication and Ego-Supportive  Discussed OCD and salvation fears, recommended "just take God's word for it" and  Re. [name] and asserting her wishes re. intimacy, confronted his behavior as nonviolently coercive and out of touch with respect for her person and her wishes.  Allowing for her freedom to choose what to give in the way of intimacy, framed her right to ask different and encouraged that "Love will listen to you."    Mental Status Exam:    Appearance:   Casual and Neat     Behavior:  Appropriate and tempred to minimize sensitive subjct  Motor:  Normal  Speech/Language:   Clear and Coherent  Affect:  uncomfortbale with subject but trusting  Mood:  anxious, irritable and privately  Thought process:  normal and worrisome  Thought content:    WNL  Sensory/Perceptual disturbances:    WNL  Orientation:  WNL  Attention:  Good  Concentration:  Fair  Memory:  [name] of knowledge:   intact  Insight:    Fair  Judgment:   Good  Impulse Control:  Good   Risk Assessment: Danger to Self:  No Self-injurious Behavior: No Danger to Others: No Duty to Warn:no Physical Aggression / Violence:No  Access to Firearms a  concern: No  Gang Involvement:No   Diagnosis:   ICD-10-CM   1. Obsessive-compulsive disorder, unspecified type F42.9   2. Relationship problem between partners Z63.0    Sexual disagreement, with longstanding unassertiveness  3. Generalized anxiety disorder F41.1   4. Recurrent major depressive disorder, in remission ([name]) F33.40    Plan:  . Further consider assertiveness and limit-setting re. unwanted sex acts . Continue to utilize previously learned skills ad lib . Maintain medication, if prescribed, and work faithfully with relevant prescriber(s) . Call the clinic on-call service, present to ER, or call 911 if any life-threatening emergency . Follow up with me in about 2-4 weeks as able    [name], PhD

## 2017-11-22 ENCOUNTER — Other Ambulatory Visit: Payer: Self-pay | Admitting: Psychiatry

## 2017-11-28 ENCOUNTER — Other Ambulatory Visit: Payer: Self-pay | Admitting: Psychiatry

## 2017-11-28 NOTE — Telephone Encounter (Signed)
Need to review paper chart  

## 2017-12-12 ENCOUNTER — Ambulatory Visit: Payer: Medicare Other | Admitting: Psychiatry

## 2017-12-12 DIAGNOSIS — Z630 Problems in relationship with spouse or partner: Secondary | ICD-10-CM

## 2017-12-12 DIAGNOSIS — F429 Obsessive-compulsive disorder, unspecified: Secondary | ICD-10-CM

## 2017-12-12 DIAGNOSIS — F411 Generalized anxiety disorder: Secondary | ICD-10-CM | POA: Diagnosis not present

## 2017-12-12 NOTE — Progress Notes (Signed)
Psychotherapy Progress Note -- Kristin Moore, PhD, [facility]  Patient ID: [name]     MRN: [id number]     Date: 12/12/2017   Therapy format: Individual psychotherapy Start: 3:16p Stop: 4:06p Time Spent: 50 min Accompanied by: none  Session narrative -- interim history, self-report of stressors and symptoms, applications of prior therapy, status changes, and interventions in session Has a good female friend [name]) in acute grief lately, up till 2am some nights talking with her, even 2-3 times a week.  Becomes an OCD trigger trying to tell her truth if approached about questions of sexual morality (e.g., whether she approves of friend having sex without marriage, etc.).  Discussed values, moral judgment, and Jesus' example of respect for all sinners, including women who were guilty of sexual sin at the time.  Surgery right side coming up January.  OT scheduled for left hand (s/p carpal tunnel and nerve disentrapment surgeries), to treat residual pain at carpal tunnel surgery site.    H still pulling covers off to gawk at her body, still insisting on anal sex.  Has given H an object lesson by pulling covers off him to make him see how it feels, but still has not explicitly said she wants him to stop, and so he has continued.  Realizes she has been overeating hoping to gain weight and turn him off.  Processed obstacles to assertiveness, largely anxiety over upsetting H, and encouraged in the validity of setting limits on unwanted contact.  Differentiated from accusing, shaming, being otherwise unfair to H.  "Love also says 'no' ."  Therapeutic modalities: Cognitive Behavioral Therapy and Assertiveness/Communication  Mental Status/Observations:  Appearance:   Casual     Behavior:  Appropriate  Motor:  Normal  Speech/Language:   Clear and Coherent  Affect:  anxious with subject, responsive to [place]  Mood:  anxious and irritated with subject  Thought process:  normal  Thought  content:    WNL  Sensory/Perceptual disturbances:    WNL  Orientation:  WNL  Attention:  Good  Concentration:  Fair  Memory:  WNL  Insight:    Fair  Judgment:   Good  Impulse Control:  Good   Risk Assessment: Danger to Self:  No Self-injurious Behavior: No Danger to Others: No Duty to Warn:no Physical Aggression / Violence:No  Access to Firearms a concern: No   Diagnosis:   ICD-10-CM   1. Obsessive-compulsive disorder, unspecified type F42.9   2. Generalized anxiety disorder F41.1   3. Relationship problem between partners Z63.0     Assessment of progress:  improving  Plan:  . Continue to assert, more directly with H, stating clearly that she does not want anal sex or to be gawked at, and framing it as her legitimate wishes, not personal fault-finding . Continue to utilize previously learned skills ad lib . Maintain medication, if prescribed, and work faithfully with relevant prescriber(s) . Call the clinic on-call service, present to ER, or call 911 if any life-threatening emergency . Follow up with me at [place], PhD

## 2018-01-15 ENCOUNTER — Telehealth (INDEPENDENT_AMBULATORY_CARE_PROVIDER_SITE_OTHER): Payer: Self-pay | Admitting: Orthopaedic Surgery

## 2018-01-15 NOTE — Telephone Encounter (Signed)
Please call patient and schedule appt w/Dr. Durward Fortes. Thank you.

## 2018-01-15 NOTE — Telephone Encounter (Signed)
Please advise 

## 2018-01-15 NOTE — Telephone Encounter (Signed)
Patient called stating 4 weeks ago she developed flu like symptoms with a fever and had pain in both knees.  Patient states her symptoms continued and she went to her PCP who prescribed antibiotics.  Patient states she is still having knee pain and a little swelling in both knees and has trouble bending them.  Patient states she has had knee replacements in both knees and isn't sure if the fever is related to her knee pain.  Patient requested a return call.

## 2018-01-15 NOTE — Telephone Encounter (Signed)
Would be worthwhile to see in office

## 2018-01-18 ENCOUNTER — Ambulatory Visit (INDEPENDENT_AMBULATORY_CARE_PROVIDER_SITE_OTHER): Payer: Medicare Other | Admitting: Orthopaedic Surgery

## 2018-01-18 ENCOUNTER — Encounter (INDEPENDENT_AMBULATORY_CARE_PROVIDER_SITE_OTHER): Payer: Self-pay | Admitting: Orthopaedic Surgery

## 2018-01-18 ENCOUNTER — Ambulatory Visit (INDEPENDENT_AMBULATORY_CARE_PROVIDER_SITE_OTHER): Payer: Self-pay

## 2018-01-18 VITALS — BP 139/89 | HR 71 | Resp 15 | Ht 64.0 in | Wt 254.0 lb

## 2018-01-18 DIAGNOSIS — M25562 Pain in left knee: Secondary | ICD-10-CM | POA: Diagnosis not present

## 2018-01-18 DIAGNOSIS — Z96659 Presence of unspecified artificial knee joint: Secondary | ICD-10-CM

## 2018-01-18 DIAGNOSIS — M25561 Pain in right knee: Secondary | ICD-10-CM

## 2018-01-18 DIAGNOSIS — G8929 Other chronic pain: Secondary | ICD-10-CM

## 2018-01-18 DIAGNOSIS — Z96651 Presence of right artificial knee joint: Secondary | ICD-10-CM

## 2018-01-18 MED ORDER — BUPIVACAINE HCL 0.5 % IJ SOLN
2.0000 mL | INTRAMUSCULAR | Status: AC | PRN
  Administered 2018-01-18: 2 mL via INTRA_ARTICULAR

## 2018-01-18 MED ORDER — METHYLPREDNISOLONE ACETATE 40 MG/ML IJ SUSP
80.0000 mg | INTRAMUSCULAR | Status: AC | PRN
  Administered 2018-01-18: 80 mg

## 2018-01-18 MED ORDER — LIDOCAINE HCL 1 % IJ SOLN
2.0000 mL | INTRAMUSCULAR | Status: AC | PRN
  Administered 2018-01-18: 2 mL

## 2018-01-18 NOTE — Progress Notes (Signed)
Office Visit Note   Patient: [name]           Date of Birth: July 15, 1955           MRN: [id number] Visit Date: 01/18/2018              Requested by: Shirline Frees, MD [address], [place] [address] PCP: Shirline Frees, MD   Assessment & Plan: Visit Diagnoses:  1. Chronic pain of both knees   2. History of revision of total knee arthroplasty   3. History of total knee arthroplasty, right     Plan:  #1: Aspiration of the left knee revealed yellow clear fluid which was sent to laboratory for of the fluid studies. #2: He is allergic to anti-inflammatories to the point where she has hives so we are not going to give her any anti-inflammatories. #3: She will follow back up with [place] in 1 week or earlier if the lab studies are abnormal.  Follow-Up Instructions: Return in about 1 week (around 01/25/2018).   Face-to-face time spent with patient was greater than 30 minutes.  Greater than 50% of the time was spent in counseling and coordination of care.  Orders:  Orders Placed This Encounter  Procedures  . Large Joint Inj  . Anaerobic and Aerobic Culture  . XR KNEE 3 VIEW RIGHT  . XR KNEE 3 VIEW LEFT  . Synovial cell count + diff, w/ crystals  . Protein, Synovial Fluid  . Glucose, synovial fluid   No orders of the defined types were placed in this encounter.     Procedures: Large Joint Inj: L knee on 01/18/2018 1:22 PM Indications: pain and diagnostic evaluation Details: 18 G 1.5 in and 3.5 in needle, anteromedial approach  Arthrogram: No  Medications: 2 mL lidocaine 1 %; 2 mL bupivacaine 0.5 %; 80 mg methylPREDNISolone acetate 40 MG/ML Aspirate: yellow and clear; sent for lab analysis Outcome: tolerated well, no immediate complications Procedure, treatment alternatives, risks and benefits explained, specific risks discussed. Consent was given by the patient. Immediately prior to procedure a time out was called to verify the correct patient,  procedure, equipment, support staff and site/side marked as required. Patient was prepped and draped in the usual sterile fashion.       Clinical Data: No additional findings.   Subjective: Chief Complaint  Patient presents with  . Right Knee - Pain  . Left Knee - Pain  [name] presents in the office today with bilateral knee pain. Patient states she fell about 3 months ago. Patient states her knees are swelling. Patient states her knees were bruised after the fall but were not painful. Patient states the onset of pain was about 2-3 weeks ago.   HPI  [name] is a very pleasant 63 year old white female who presents today with a history of bilateral knee pain.  Patient states that in mid December she started having some sinus infections.  She waited a couple days and then apparently was started on anti-biotics.  She is also now noted pain in both her knees that is started with an insidious onset.  She has had a revision left total knee arthroplasty in 2013.  And she had a right total knee replacement in October 2015.  Any history of injury or trauma.  She just started having this aching pain which she relates to the beginning of her sinus infection.  She is seen today for evaluation.  Review of Systems  Constitutional: Negative  for chills, fatigue and fever.  HENT: Negative for hearing loss and tinnitus.   Eyes: Negative for itching.  Respiratory: Negative for chest tightness and shortness of breath.   Cardiovascular: Negative for chest pain, palpitations and leg swelling.  Gastrointestinal: Negative for blood in stool, constipation and diarrhea.  Endocrine: Negative for polyuria.  Genitourinary: Negative for dysuria.  Musculoskeletal: Positive for arthralgias and neck stiffness. Negative for back pain, joint swelling and neck pain.  Allergic/Immunologic: Negative for immunocompromised state.  Neurological: Negative for dizziness, numbness and headaches.  Hematological: Does not  bruise/bleed easily.  Psychiatric/Behavioral: Negative for sleep disturbance. The patient is nervous/anxious.      Objective: Vital Signs: BP 139/89 (BP Location: Right Arm, Patient Position: Sitting, Cuff Size: Normal)   Pulse 71   Resp 15   Ht 5\' 4"  (1.626 m)   Wt 254 lb (115.2 kg)   BMI 43.60 kg/m   Physical Exam Constitutional:      Appearance: She is well-developed.  Eyes:     Pupils: Pupils are equal, round, and reactive to light.  Pulmonary:     Effort: Pulmonary effort is normal.  Skin:    General: Skin is warm and dry.  Neurological:     Mental Status: She is alert and oriented to person, place, and time.  Psychiatric:        Behavior: Behavior normal.     Ortho Exam  Left knee today reveals no erythema or warmth.  He lacks about 3 to 5 degrees of full extension flexes to about 90 to 95 degrees.  She is ligamentously stable.  No patella palpable.  Right knee today again reveals no erythema or warmth also.  She has full extension and flexes to about 100 degrees.  Good ligament stability.  No palpable patella.   Specialty Comments:  No specialty comments available.  Imaging: Xr Knee 3 View Left  Result Date: 01/18/2018 Three-view x-ray of the left knee reveals a revision prosthesis of the distal femur and proximal tibia in good position alignment.  No obvious deformities.  Xr Knee 3 View Right  Result Date: 01/18/2018 Three-view x-ray of the right knee reveals excellent position alignment of the prosthesis of the distal femur and proximal tibia.  There is no patella noted.  No radiolucencies.    PMFS History: Patient Active Problem List   Diagnosis Date Noted  . Obsessive-compulsive disorder 10/10/2017  . Generalized anxiety disorder 10/10/2017  . Major depressive disorder, recurrent episode, moderate ([place]) 10/10/2017  . Hiatal hernia 03/03/2015  . Steatosis of liver 09/16/2014  . Obstructive sleep apnea syndrome 08/27/2014  . Pre-op evaluation  05/14/2014  . Osteoarthritis of left knee 10/17/2013  . 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 Hypercholesterolemia 08/05/2013  . Palpitations 08/05/2013  . HTN (hypertension) 08/05/2013  . 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 Preoperative cardiovascular examination 08/05/2013  . Degenerative arthritis of knee, bilateral 08/05/2013  . Painful total knee replacement ([place]) 11/03/2011  . Morbid obesity ([place]) 11/03/2011  . Hypothyroid 11/03/2011   Past Medical History:  Diagnosis Date  . Anginal pain ([place])    "on and off" (11/01/2011) - anxiety related  . Anxiety    takes Paxil daily and Xanax prn  . Arthritis    "knees" (10/16/2013)  . Chronic back pain    facet disease and bulging disc; "thoracic and lower back" (10/16/2013)  . Constipation    r/t pain meds;takes stool softener daily  .  Depression   . Dysrhythmia    rapid HR on occasion-takes Propranolol daily  . Family history of anesthesia complication    ":PONV; mom and sisters"  . GERD (gastroesophageal reflux disease)    takes Prilosec   . H/O hiatal hernia   . History of bladder infections    > 59yr ago  . Hyperlipidemia    takes LIpitor daily  . Hypothyroidism    takes Synthroid daily  . Insomnia   . Joint pain   . Joint swelling   . Kidney stones   . Muscle spasms of head or neck    lumbar and thoracic;takes Robaxin prn  . Peripheral neuropathy   . PONV (postoperative nausea and vomiting)   . Rosacea   . Seasonal allergies    takes allegra prn    Family History  Problem Relation Age of Onset  . Heart disease Mother 91  . Heart failure Mother   . Heart attack Father   . Heart disease Father 11    Past Surgical History:  Procedure Laterality Date  . ABDOMINAL HYSTERECTOMY  1980's  . ANAL FISSURE REPAIR  ~ 2011   "banded hemorrhoids at this time too"  . BARIATRIC SURGERY    . CARDIAC CATHETERIZATION  ~ 1999  . CARPAL TUNNEL RELEASE Left  09/13/2017   & ulnar nerve release  . CERVICAL DISC SURGERY  03/2017   C5, C6   . COLONOSCOPY    . ESOPHAGOGASTRODUODENOSCOPY    . JOINT REPLACEMENT     left knee  . KNEE ARTHROPLASTY Left 2007  . KNEE ARTHROSCOPY Right   . KNEE ARTHROSCOPY Right    "torn meniscus"  . KNEE SURGERY Left 1982-2013   "17 before 10/31/2013"  . LEFT OOPHORECTOMY  1980's  . PATELLECTOMY Right   . PLANTAR FASCIA SURGERY Bilateral 1990's  . REVISION TOTAL KNEE ARTHROPLASTY Left 11/01/2011  . TOTAL KNEE ARTHROPLASTY Right 10/15/2013   Procedure: RIGHT TOTAL KNEE ARTHROPLASTY;  Surgeon: Garald Balding, MD;  Location: [facility];  Service: Orthopedics;  Laterality: Right;  . TOTAL KNEE REVISION  11/01/2011   Procedure: TOTAL KNEE REVISION;  Surgeon: Garald Balding, MD;  Location: [facility];  Service: Orthopedics;  Laterality: Left;  Revision Left Total Knee Replacement   . TUBAL LIGATION  1981   Social History   Occupational History  . Not on file  Tobacco Use  . Smoking status: Never Smoker  . Smokeless tobacco: Never Used  Substance and Sexual Activity  . Alcohol use: No  . Drug use: No  . Sexual activity: Yes    Birth control/protection: Surgical   Current Outpatient Medications  Medication Sig Dispense Refill  . ALPRAZolam (XANAX) 0.5 MG tablet Take 0.25 mg by mouth 3 (three) times daily as needed for anxiety.     . Ascorbic Acid (VITAMIN C) 1000 MG tablet Take 1,000 mg by mouth daily.    . Cholecalciferol (VITAMIN D-3) 5000 UNITS TABS Take 5,000 Units by mouth daily.    Marland Kitchen ezetimibe (ZETIA) 10 MG tablet     . levothyroxine (SYNTHROID, LEVOTHROID) 150 MCG tablet Take 175 mcg by mouth daily before breakfast.     . Multiple Vitamin (MULTIVITAMIN) tablet Take 1 tablet by mouth daily. Reported on 04/28/2015    . PARoxetine (PAXIL) 20 MG tablet TAKE 1 TABLET BY MOUTH TWICE DAILY AND 1TABLET AT BEDTIME 90 tablet 0  . pramipexole (MIRAPEX) 0.25 MG tablet TAKE ONE AND ONE-HALF TABLETS (0.375 MG)BY MOUTH  TWICE DAILY.  90 tablet 1  . propranolol (INDERAL) 60 MG tablet Take 60 mg by mouth daily.    Marland Kitchen tiZANidine (ZANAFLEX) 4 MG tablet tizanidine 4 mg tablet    . atorvastatin (LIPITOR) 10 MG tablet Take 10 mg by mouth Daily.    . cetirizine (ZYRTEC) 10 MG tablet Take 10 mg by mouth daily.    . hydrochlorothiazide (HYDRODIURIL) 25 MG tablet Take 25 mg by mouth as needed (ankle swelling).    Marland Kitchen HYDROmorphone (DILAUDID) 2 MG tablet Take 1-2 tablets (2-4 mg total) by mouth every 4 (four) hours as needed for severe pain. (Patient not taking: Reported on 02/17/2017) 90 tablet 0  . lidocaine (LIDODERM) 5 % Place 1 patch onto the skin every 12 (twelve) hours as needed (knee, back). Remove & Discard patch within 12 hours or as directed by MD    . methocarbamol (ROBAXIN) 500 MG tablet TAKE 1 TABLET BY MOUTH 3 TIMES A DAY AS NEEDED FOR MUSCLE SPASMS.  2  . omeprazole (PRILOSEC) 40 MG capsule Take 40 mg by mouth 2 (two) times daily.     Marland Kitchen PARoxetine (PAXIL-CR) 25 MG 24 hr tablet Take 25 mg by mouth daily. 37.5 mg paxil in the am and 25 mg at night.    Marland Kitchen PARoxetine (PAXIL-CR) 37.5 MG 24 hr tablet Take 37.5 mg by mouth every morning.     No current facility-administered medications for this visit.

## 2018-01-19 ENCOUNTER — Ambulatory Visit: Payer: Medicare Other | Admitting: Psychiatry

## 2018-01-19 DIAGNOSIS — Z630 Problems in relationship with spouse or partner: Secondary | ICD-10-CM

## 2018-01-19 DIAGNOSIS — F411 Generalized anxiety disorder: Secondary | ICD-10-CM | POA: Diagnosis not present

## 2018-01-19 DIAGNOSIS — F429 Obsessive-compulsive disorder, unspecified: Secondary | ICD-10-CM | POA: Diagnosis not present

## 2018-01-19 NOTE — Progress Notes (Signed)
Psychotherapy Progress Note [facility]  Patient ID: [name]     MRN: [id number]      Date: 01/19/2018     Start: 3:12p Stop: 4:03p Time Spent: 51 min Therapy format: Individual psychotherapy  Session narrative -- presenting needs, interim history, self-report of stressors and symptoms, applications of prior therapy, status changes, and interventions in session Surgery deferred due to left hand not ready.  Jimmy's gawking behavior has stopped while PT sick and he has slept in another room.  PT has had repetitive sinus infx, and the reprieve has been wonderful.  Refreshed measures to retrain him once the situation subsides.  Friend/neighbor passed away from heart attack sustained during a more or less routine heart procedure.  Triggered spiritual doubt OCD until she relapsed in praying the Sinner's Prayer and Prayer of Recommitment.  Seeking refresher on prior advice.  Led to recall principles, like "Just b/c it's loud doesn't mean it's true" and "You already have evidence of God walking with you."  Refreshed on the central therapeutic question, "When you pray that prayer, which God is listening?"  Discussed how to educate loved ones who call it silly if she shares OCD worries.  Offered she may ask them about a panic feeling they might have had and relate it to that, that when she gets certain thoughts, it sets off a panic reaction like that, even though it is without good reason and without thinking herself into it.  This happened young, before she knew how to tell the difference, so she made sense of it the only way she could and did what she was taught spiritually by her super-conservative father.  Once habit, for many years, it was not easily attainable to dispute it, and repeatedly giving in to the compulsion to fix it through prayer became an ever more strongly conditioned habit that takes patience and diligent repetition to overcome.  Also reframed OCD attack as a form of  micro-seizure or a "brain cramp" that happens to involve strong feelings of doubt, uninvited but painful.  Agrees, will use.  Therapeutic modalities: Cognitive Behavioral Therapy, Assertiveness/Communication, Solution-Oriented/Positive Psychology, Ego-Supportive and faith-sensitive  Mental Status/Observations:  Appearance:   Casual and Neat     Behavior:  Appropriate  Motor:  Restlestness  Speech/Language:   Clear and Coherent  Affect:  Appropriate and anxious  Mood:  anxious  Thought process:  concrete  Thought content:    Obsessions  Sensory/Perceptual disturbances:    WNL  Orientation:  Intact  Attention:  Good  Concentration:  Fair  Memory:  grossly intact  Insight:    Fair  Judgment:   Good  Impulse Control:  Fair   Risk Assessment: Danger to Self:  No Self-injurious Behavior: No Danger to Others: No Duty to Warn:no Physical Aggression / Violence:No  Access to Firearms a concern: No   Diagnosis:   ICD-10-CM   1. Obsessive-compulsive disorder, unspecified type F42.9   2. Generalized anxiety disorder F41.1   3. Relationship problem between partners Z63.0     Assessment of progress:  stable  Plan:  . Make fuller use of "Which God?" question . Practice unstructured, open prayer where able . Stay ready to challenge husband's objectifying behavior once normal sleeping arrangements return . Mentally practice explanations to loved ones if dismissed about OCD sxs . Continue to utilize previously learned skills ad lib . Maintain medication, if prescribed, and work faithfully with relevant prescriber(s) . Call the clinic on-call service, present to ER, or  call 911 if any life-threatening emergency . Follow up with me in about 1 mo. as able   [name], PhD  Licensed Psychologist

## 2018-01-24 LAB — SYNOVIAL CELL COUNT + DIFF, W/ CRYSTALS
Basophils, %: 0 %
Eosinophils-Synovial: 0 % (ref 0–2)
Lymphocytes-Synovial Fld: 21 % (ref 0–74)
Monocyte/Macrophage: 24 % (ref 0–69)
Neutrophil, Synovial: 54 % — ABNORMAL HIGH (ref 0–24)
Synoviocytes, %: 1 % (ref 0–15)
WBC, Synovial: 1465 cells/uL — ABNORMAL HIGH (ref <150)

## 2018-01-24 LAB — PROTEIN, SYNOVIAL FLUID: PROTEIN, TOTAL, SYNOVIAL FLUID: 3.7 g/dL — ABNORMAL HIGH (ref 1.0–3.0)

## 2018-01-24 LAB — GLUCOSE, SYNOVIAL FLUID: Glucose, Synovial Fluid: <10 mg/dL

## 2018-01-24 LAB — ANAEROBIC AND AEROBIC CULTURE
AER RESULT:: NO GROWTH
MICRO NUMBER:: 68859
MICRO NUMBER:: 68860
SPECIMEN QUALITY:: ADEQUATE
SPECIMEN QUALITY:: ADEQUATE

## 2018-01-25 ENCOUNTER — Ambulatory Visit (INDEPENDENT_AMBULATORY_CARE_PROVIDER_SITE_OTHER): Payer: Medicare Other | Admitting: Orthopedic Surgery

## 2018-01-25 ENCOUNTER — Encounter (INDEPENDENT_AMBULATORY_CARE_PROVIDER_SITE_OTHER): Payer: Self-pay | Admitting: Orthopedic Surgery

## 2018-01-25 VITALS — BP 121/86 | HR 73 | Resp 18 | Ht 64.0 in | Wt 254.0 lb

## 2018-01-25 DIAGNOSIS — G8929 Other chronic pain: Secondary | ICD-10-CM | POA: Diagnosis not present

## 2018-01-25 DIAGNOSIS — Z96653 Presence of artificial knee joint, bilateral: Secondary | ICD-10-CM | POA: Diagnosis not present

## 2018-01-25 DIAGNOSIS — M25562 Pain in left knee: Secondary | ICD-10-CM

## 2018-01-25 NOTE — Progress Notes (Signed)
Office Visit Note   Patient: [name]           Date of Birth: 28-May-1955           MRN: [id number] Visit Date: 01/25/2018              Requested by: Shirline Frees, MD [address], [place] [address] PCP: Shirline Frees, MD   Assessment & Plan: Visit Diagnoses:  1. Chronic pain of left knee   2. Total knee replacement status, bilateral     Plan:  #1: At this time her aspiration revealed calcium pyrophosphate crystals.  There was some elevation in her white cells in the aspiration.  I chose at this time not to inject cortisone intra-articularly. #2: We had discussed the possibility of nondisplaced fracture or possible loosening though on the x-ray does not appear to have any signs of loosening.  She is inclined to have a three-phase bone scan which I have discussed with her.  We will proceed with this in the very near future.  Return after scan.  Follow-Up Instructions: Return after scan.  Will follow back up after bone scan is completed.  Face-to-face time spent with patient was greater than 30 minutes.  Greater than 50% of the time was spent in counseling and coordination of care.    Orders:  Orders Placed This Encounter  Procedures  . NM Bone Scan 3 Phase  . CBC with Differential/Platelet  . Sed Rate (ESR)  . C-reactive protein   No orders of the defined types were placed in this encounter.     Procedures: No procedures performed   Clinical Data: No additional findings.   Subjective: Chief Complaint  Patient presents with  . Left Knee - Pain  . Right Knee - Pain  . Knee Pain    Patient was seen 01/18/18, left knee was aspirated. Bilateral knee pain, left knee is worse. Lateral knee pain, swelling, constant ache certain movements will cause sharp stabbing pain. Tylenol and ice.     HPI  [name] is a very pleasant 63 year old white female who is seen today for follow-up of her bilateral knee pain.  I saw her back on 18 January 2018 and at that time I did a left knee aspiration.  A came back revealing calcium pyrophosphate crystals and a white cell count of 1465.  54% were neutrophils.  Culture was negative.  Still complaining of pain in the knees more on the left than on the right.   Review of Systems  Constitutional: Negative for fever.  HENT: Positive for congestion and ear pain.   Eyes: Negative for pain.  Respiratory: Negative for chest tightness and shortness of breath.   Cardiovascular: Negative for leg swelling.  Gastrointestinal: Negative for abdominal pain.  Endocrine: Negative for cold intolerance.  Genitourinary: Negative for difficulty urinating.  Musculoskeletal: Positive for gait problem.  Skin: Negative for rash.  Allergic/Immunologic: Negative for food allergies.  Neurological: Negative for dizziness.  Hematological: Does not bruise/bleed easily.  Psychiatric/Behavioral: Negative for sleep disturbance.     Objective: Vital Signs: BP 121/86 (BP Location: Left Arm, Patient Position: Sitting, Cuff Size: Normal)   Pulse 73   Resp 18   Ht _0  (1.626 m)   Wt 254 lb (115.2 kg)   BMI 43.60 kg/m   Physical Exam Constitutional:      Appearance: She is well-developed.  Eyes:     Pupils: Pupils are equal, round, and reactive to light.  Pulmonary:     Effort: Pulmonary effort is normal.  Skin:    General: Skin is warm and dry.  Neurological:     Mental Status: She is alert and oriented to person, place, and time.  Psychiatric:        Behavior: Behavior normal.     Ortho Exam  Left knee reveals no warmth or erythema.  I think she still has a little bit of an effusion.  Range of motion from about 3 to 5 degrees shy of full extension to 90 to 95 degrees of flexion.  Ligamentously stable.  No palpable patella  Right knee reveals range of motion 0 to 100 degrees.  Good ligament stability.  No palpable patella  Specialty Comments:  No specialty comments available.  Imaging: No results  found.   PMFS History: Current Outpatient Medications  Medication Sig Dispense Refill  . ALPRAZolam (XANAX) 0.5 MG tablet Take 0.25 mg by mouth 3 (three) times daily as needed for anxiety.     . Ascorbic Acid (VITAMIN C) 1000 MG tablet Take 1,000 mg by mouth daily.    . Cholecalciferol (VITAMIN D-3) 5000 UNITS TABS Take 5,000 Units by mouth daily.    Marland Kitchen ezetimibe (ZETIA) 10 MG tablet     . levothyroxine (SYNTHROID, LEVOTHROID) 150 MCG tablet Take 175 mcg by mouth daily before breakfast.     . methocarbamol (ROBAXIN) 500 MG tablet TAKE 1 TABLET BY MOUTH 3 TIMES A DAY AS NEEDED FOR MUSCLE SPASMS.  2  . Multiple Vitamin (MULTIVITAMIN) tablet Take 1 tablet by mouth daily. Reported on 04/28/2015    . pramipexole (MIRAPEX) 0.25 MG tablet TAKE ONE AND ONE-HALF TABLETS (0.375 MG)BY MOUTH TWICE DAILY. 90 tablet 1  . propranolol (INDERAL) 60 MG tablet Take 60 mg by mouth daily.    Marland Kitchen atorvastatin (LIPITOR) 10 MG tablet Take 10 mg by mouth Daily.    . cetirizine (ZYRTEC) 10 MG tablet Take 10 mg by mouth daily.    . hydrochlorothiazide (HYDRODIURIL) 25 MG tablet Take 25 mg by mouth as needed (ankle swelling).    Marland Kitchen HYDROmorphone (DILAUDID) 2 MG tablet Take 1-2 tablets (2-4 mg total) by mouth every 4 (four) hours as needed for severe pain. (Patient not taking: Reported on 02/17/2017) 90 tablet 0  . lidocaine (LIDODERM) 5 % Place 1 patch onto the skin every 12 (twelve) hours as needed (knee, back). Remove & Discard patch within 12 hours or as directed by MD    . omeprazole (PRILOSEC) 40 MG capsule Take 40 mg by mouth 2 (two) times daily.     Marland Kitchen PARoxetine (PAXIL) 20 MG tablet TAKE 1 TABLET BY MOUTH TWICE DAILY AND 1TABLET AT BEDTIME (Patient not taking: Reported on 01/25/2018) 90 tablet 0  . PARoxetine (PAXIL-CR) 25 MG 24 hr tablet Take 25 mg by mouth daily. 37.5 mg paxil in the am and 25 mg at night.    Marland Kitchen PARoxetine (PAXIL-CR) 37.5 MG 24 hr tablet Take 37.5 mg by mouth every morning.    Marland Kitchen tiZANidine (ZANAFLEX) 4  MG tablet tizanidine 4 mg tablet     No current facility-administered medications for this visit.      Patient Active Problem List   Diagnosis Date Noted  . Obsessive-compulsive disorder 10/10/2017  . Generalized anxiety disorder 10/10/2017  . Major depressive disorder, recurrent episode, moderate ([place]) 10/10/2017  . Hiatal hernia 03/03/2015  . Steatosis of liver 09/16/2014  . Obstructive sleep apnea syndrome 08/27/2014  . Pre-op evaluation 05/14/2014  .  Osteoarthritis of left knee 10/17/2013  . 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 Hypercholesterolemia 08/05/2013  . Palpitations 08/05/2013  . HTN (hypertension) 08/05/2013  . 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 Preoperative cardiovascular examination 08/05/2013  . Degenerative arthritis of knee, bilateral 08/05/2013  . Painful total knee replacement ([place]) 11/03/2011  . Morbid obesity ([place]) 11/03/2011  . Hypothyroid 11/03/2011   Past Medical History:  Diagnosis Date  . Anginal pain ([place])    "on and off" (11/01/2011) - anxiety related  . Anxiety    takes Paxil daily and Xanax prn  . Arthritis    "knees" (10/16/2013)  . Chronic back pain    facet disease and bulging disc; "thoracic and lower back" (10/16/2013)  . Constipation    r/t pain meds;takes stool softener daily  . Depression   . Dysrhythmia    rapid HR on occasion-takes Propranolol daily  . Family history of anesthesia complication    ":PONV; mom and sisters"  . GERD (gastroesophageal reflux disease)    takes Prilosec   . H/O hiatal hernia   . History of bladder infections    > 79yrago  . Hyperlipidemia    takes LIpitor daily  . Hypothyroidism    takes Synthroid daily  . Insomnia   . Joint pain   . Joint swelling   . Kidney stones   . Muscle spasms of head or neck    lumbar and thoracic;takes Robaxin prn  . Peripheral neuropathy   . PONV (postoperative nausea and vomiting)   . Rosacea    . Seasonal allergies    takes allegra prn    Family History  Problem Relation Age of Onset  . Heart disease Mother 741 . Heart failure Mother   . Heart attack Father   . Heart disease Father 531   Past Surgical History:  Procedure Laterality Date  . ABDOMINAL HYSTERECTOMY  1980's  . ANAL FISSURE REPAIR  ~ 2011   "banded hemorrhoids at this time too"  . BARIATRIC SURGERY    . CARDIAC CATHETERIZATION  ~ 1999  . CARPAL TUNNEL RELEASE Left 09/13/2017   & ulnar nerve release  . CERVICAL DISC SURGERY  03/2017   C5, C6   . COLONOSCOPY    . ESOPHAGOGASTRODUODENOSCOPY    . JOINT REPLACEMENT     left knee  . KNEE ARTHROPLASTY Left 2007  . KNEE ARTHROSCOPY Right   . KNEE ARTHROSCOPY Right    "torn meniscus"  . KNEE SURGERY Left 1982-2013   "17 before 10/31/2013"  . LEFT OOPHORECTOMY  1980's  . PATELLECTOMY Right   . PLANTAR FASCIA SURGERY Bilateral 1990's  . REVISION TOTAL KNEE ARTHROPLASTY Left 11/01/2011  . TOTAL KNEE ARTHROPLASTY Right 10/15/2013   Procedure: RIGHT TOTAL KNEE ARTHROPLASTY;  Surgeon: PGarald Balding MD;  [facility]  Service: Orthopedics;  Laterality: Right;  . TOTAL KNEE REVISION  11/01/2011   Procedure: TOTAL KNEE REVISION;  Surgeon: PGarald Balding MD;  [facility]  Service: Orthopedics;  Laterality: Left;  Revision Left Total Knee Replacement   . TUBAL LIGATION  1981   Social History   Occupational History  . Not on file  Tobacco Use  . Smoking status: Never Smoker  . Smokeless tobacco: Never Used  Substance and Sexual Activity  . Alcohol use: No  . Drug use: No  . Sexual activity: Yes    Birth control/protection: Surgical

## 2018-01-26 LAB — CBC WITH DIFFERENTIAL/PLATELET
Absolute Monocytes: 567 cells/uL (ref 200–950)
Basophils Absolute: 61 cells/uL (ref 0–200)
Basophils Relative: 1.1 %
Eosinophils Absolute: 149 cells/uL (ref 15–500)
Eosinophils Relative: 2.7 %
HCT: 39.5 % (ref 35.0–45.0)
Hemoglobin: 13.3 g/dL (ref 11.7–15.5)
Lymphs Abs: 1672 cells/uL (ref 850–3900)
MCH: 31.5 pg (ref 27.0–33.0)
MCHC: 33.7 g/dL (ref 32.0–36.0)
MCV: 93.6 fL (ref 80.0–100.0)
MPV: 9 fL (ref 7.5–12.5)
Monocytes Relative: 10.3 %
Neutro Abs: 3053 cells/uL (ref 1500–7800)
Neutrophils Relative %: 55.5 %
Platelets: 378 10*3/uL (ref 140–400)
RBC: 4.22 10*6/uL (ref 3.80–5.10)
RDW: 13.4 % (ref 11.0–15.0)
Total Lymphocyte: 30.4 %
WBC: 5.5 10*3/uL (ref 3.8–10.8)

## 2018-01-26 LAB — C-REACTIVE PROTEIN: CRP: 2.7 mg/L (ref <8.0)

## 2018-01-26 LAB — SEDIMENTATION RATE: Sed Rate: 43 mm/h — ABNORMAL HIGH (ref 0–30)

## 2018-01-29 ENCOUNTER — Other Ambulatory Visit: Payer: Self-pay | Admitting: Family Medicine

## 2018-01-29 ENCOUNTER — Ambulatory Visit
Admission: RE | Admit: 2018-01-29 | Discharge: 2018-01-29 | Disposition: A | Discharge status: Home / Self Care | Payer: Medicare Other | Source: Ambulatory Visit | Attending: Family Medicine | Admitting: Family Medicine

## 2018-01-29 DIAGNOSIS — Z1231 Encounter for screening mammogram for malignant neoplasm of breast: Secondary | ICD-10-CM

## 2018-01-31 ENCOUNTER — Other Ambulatory Visit: Payer: Self-pay | Admitting: Family Medicine

## 2018-01-31 DIAGNOSIS — R928 Other abnormal and inconclusive findings on diagnostic imaging of breast: Secondary | ICD-10-CM

## 2018-02-02 ENCOUNTER — Ambulatory Visit
Admission: RE | Admit: 2018-02-02 | Discharge: 2018-02-02 | Disposition: A | Discharge status: Home / Self Care | Payer: Medicare Other | Source: Ambulatory Visit | Attending: Family Medicine | Admitting: Family Medicine

## 2018-02-02 DIAGNOSIS — R928 Other abnormal and inconclusive findings on diagnostic imaging of breast: Secondary | ICD-10-CM

## 2018-02-07 ENCOUNTER — Other Ambulatory Visit: Payer: Self-pay | Admitting: Neurosurgery

## 2018-02-07 DIAGNOSIS — D492 Neoplasm of unspecified behavior of bone, soft tissue, and skin: Secondary | ICD-10-CM

## 2018-02-09 ENCOUNTER — Ambulatory Visit: Payer: Medicare Other | Admitting: Psychiatry

## 2018-02-09 DIAGNOSIS — F341 Dysthymic disorder: Secondary | ICD-10-CM

## 2018-02-09 DIAGNOSIS — F429 Obsessive-compulsive disorder, unspecified: Secondary | ICD-10-CM | POA: Diagnosis not present

## 2018-02-09 DIAGNOSIS — Z630 Problems in relationship with spouse or partner: Secondary | ICD-10-CM

## 2018-02-09 NOTE — Progress Notes (Signed)
Psychotherapy Progress Note [facility] [name], PhD LP  Patient ID: [name]     MRN: [id number]      Therapy format: Individual psychotherapy Date: 02/09/2018     Start: 4:10p Stop: 5:00p Time Spent: 50 min  Session narrative -- presenting needs, interim history, self-report of stressors and symptoms, applications of prior therapy, status changes, and interventions made in session Had mammogram and "the dreaded call" to come back for further diagnostic.  Triggered OCD thoughts of getting ill, dying, resorting to The Sinner's Prayer, etc..  Eventually coped by calling out loud "Stop!" and re-labeling her thoughts "brain cramps" as suggested last session.  Also triggered some by news of a neighbor dying 3 weeks ago.  Reframed and affirmed "Stop" as discovering on her own the behavior therapy technique of thought-stopping.  Affirmed the success of doing so and encouraged willingness to vocalize it again, or to think it loudly as the practice of conscious redirection.  Refreshed understandings of compulsive prayer as "incantation" and 1-sided conversation, the necessity of looking/listening for God's loving character when offering up prayer, and the importance of varying the form of prayer both to stay fresh and to combat compulsive tendencies.  Est. 40% conviction that her OCD really is a neurological issue now, but has had insight that it is a trust issue more than a "belief" issue.  Now wrestling with how to tell if she "really" trusts God.  Can count blessings, see God in creation.  Still struggles with sense of conviction that she is intrinsically bad, unworthy.  In support of this, her aunt has repeatedly convicted her that she didn't pay back a debt to her mother while she was alive, feels bad, personally bad.  History noted that mother both settled the issue and seemed to still resent it prior to her death (explicitly giving an heirloom to her brother as fair  settlement, then still bringing up failure to pay back later).  Support provided and encouragement that perhaps her mother sees it differently now.  In closing, shared another patient's success with E/RP, particularly the successful ordeal and quick benefit of being fully in touch with what intimidates, and coached in self-support through E/RP moments, suggesting a frame like "Just because it's loud doesn't mean it's true.  This feeling is going to stop sometime.  I'm going to go on without the 'incantation' until it's OK.  With your help, Lord."  Therapeutic modalities: Cognitive Behavioral Therapy and faith-sensitive  Mental Status/Observations:  Appearance:   Casual     Behavior:  Appropriate  Motor:  Normal  Speech/Language:   Clear and Coherent and some halting  Affect:  Appropriate and anxious with content  Mood:  anxious and responsive  Thought process:  somewhat obsessive  Thought content:    Obsessions and doubts  Sensory/Perceptual disturbances:    WNL  Orientation:  WNL  Attention:  Good  Concentration:  Good  Memory:  WNL  Insight:    Fair  Judgment:   Fair  Impulse Control:  Good   Risk Assessment: Danger to Self:  No Self-injurious Behavior: No Danger to Others: No Duty to Warn:no Physical Aggression / Violence:No  Access to Firearms a concern: No   Diagnosis:   ICD-10-CM   1. Obsessive-compulsive disorder, unspecified type F42.9    Spiritual focus/scrupulosity  2. 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 Relationship problem between partners Z63.0     Assessment of progress:  improving  Plan:  .  Continue ad lib exposure to intrusive thoughts of unworthiness and being out of favor with God and redirect prayer forms as above . Continue to utilize previously learned skills ad lib . Maintain medication as prescribed and work faithfully with relevant prescriber(s) if any changes are desired or seem indicated . Call the clinic on-call service, present to ER, or call 911  if any life-threatening emergency Return in about 3 weeks (around 03/02/2018). Future Appointments  Date Time Provider [place]  02/13/2018 12:00 PM [facility]NM 1 [facility]  02/13/2018  3:00 PM MC-NM 3 MC[facility]NM [facility]  03/02/2018  3:00 PM [name], PhD CP-CP None  03/23/2018  3:00 PM [name], PhD CP-CP None     [name], PhD [facility] Licensed Psychologist

## 2018-02-11 ENCOUNTER — Ambulatory Visit
Admission: RE | Admit: 2018-02-11 | Discharge: 2018-02-11 | Disposition: A | Discharge status: Home / Self Care | Payer: Medicare Other | Source: Ambulatory Visit | Attending: Neurosurgery | Admitting: Neurosurgery

## 2018-02-11 DIAGNOSIS — D492 Neoplasm of unspecified behavior of bone, soft tissue, and skin: Secondary | ICD-10-CM

## 2018-02-11 MED ORDER — GADOBENATE DIMEGLUMINE 529 MG/ML IV SOLN
20.0000 mL | Freq: Once | INTRAVENOUS | Status: AC | PRN
  Administered 2018-02-11: 20 mL via INTRAVENOUS

## 2018-02-12 DIAGNOSIS — F341 Dysthymic disorder: Secondary | ICD-10-CM | POA: Insufficient documentation

## 2018-02-13 ENCOUNTER — Encounter (HOSPITAL_COMMUNITY)
Admission: RE | Admit: 2018-02-13 | Discharge: 2018-02-13 | Disposition: A | Discharge status: Home / Self Care | Payer: Medicare Other | Source: Ambulatory Visit | Attending: Orthopedic Surgery | Admitting: Orthopedic Surgery

## 2018-02-13 DIAGNOSIS — M25562 Pain in left knee: Secondary | ICD-10-CM | POA: Insufficient documentation

## 2018-02-13 DIAGNOSIS — G8929 Other chronic pain: Secondary | ICD-10-CM | POA: Diagnosis present

## 2018-02-13 MED ORDER — TECHNETIUM TC 99M MEDRONATE IV KIT: 20.0000 | PACK | Freq: Once | INTRAVENOUS | Status: DC | PRN

## 2018-02-19 ENCOUNTER — Encounter (INDEPENDENT_AMBULATORY_CARE_PROVIDER_SITE_OTHER): Payer: Self-pay | Admitting: Orthopaedic Surgery

## 2018-02-19 ENCOUNTER — Telehealth (INDEPENDENT_AMBULATORY_CARE_PROVIDER_SITE_OTHER): Payer: Self-pay | Admitting: Orthopaedic Surgery

## 2018-02-19 ENCOUNTER — Ambulatory Visit (INDEPENDENT_AMBULATORY_CARE_PROVIDER_SITE_OTHER): Payer: Medicare Other | Admitting: Orthopaedic Surgery

## 2018-02-19 VITALS — BP 166/90 | HR 70 | Ht 64.0 in | Wt 256.0 lb

## 2018-02-19 DIAGNOSIS — G8929 Other chronic pain: Secondary | ICD-10-CM

## 2018-02-19 DIAGNOSIS — M25561 Pain in right knee: Secondary | ICD-10-CM

## 2018-02-19 DIAGNOSIS — M25562 Pain in left knee: Secondary | ICD-10-CM | POA: Diagnosis not present

## 2018-02-19 MED ORDER — METHYLPREDNISOLONE ACETATE 40 MG/ML IJ SUSP
80.0000 mg | INTRAMUSCULAR | Status: AC | PRN
  Administered 2018-02-19: 80 mg via INTRA_ARTICULAR

## 2018-02-19 MED ORDER — BUPIVACAINE HCL 0.5 % IJ SOLN
2.0000 mL | INTRAMUSCULAR | Status: AC | PRN
  Administered 2018-02-19: 2 mL via INTRA_ARTICULAR

## 2018-02-19 MED ORDER — LIDOCAINE HCL 1 % IJ SOLN
2.0000 mL | INTRAMUSCULAR | Status: AC | PRN
  Administered 2018-02-19: 2 mL

## 2018-02-19 NOTE — Progress Notes (Signed)
Office Visit Note   Patient: [name]           Date of Birth: November 04, 1955           MRN: [id number] Visit Date: 02/19/2018              Requested by: Shirline Frees, MD [address] [place], [place] [address] PCP: Shirline Frees, MD   Assessment & Plan: Visit Diagnoses:  1. Chronic pain of both knees     Plan: Three-phase bone scan revealed focally increased delayed uptake of tracer at the left lateral tibial plateau and at the right medial femoral.  They could not exclude aseptic loosening of bilateral knee prosthesis.  Mrs. Denherder is not symptomatic on the right knee today.  She is tender along the lateral tibial plateau of the left knee but there is no effusion or increased warmth.  She did have a knee aspiration recently revealing 1500 cells 54% of which were neutrophils and there were positive pseudogout crystals.  X-rays reveal excellent position of the components without obvious loosening.  I am not sure that the tibial component of the left total knee revision is loose.  I am going to inject the lateral proximal tibia with cortisone and monitor her response if this does not help then I would inject the joint will reevaluate in 2 weeks  Follow-Up Instructions: Return in about 2 weeks (around 03/05/2018).   Orders:  Orders Placed This Encounter  Procedures  . Large Joint Inj: L knee   No orders of the defined types were placed in this encounter.     Procedures: Large Joint Inj: L knee on 02/19/2018 3:23 PM Indications: pain and diagnostic evaluation Details: 25 G 1.5 in needle, anterolateral approach  Arthrogram: No  Medications: 2 mL lidocaine 1 %; 2 mL bupivacaine 0.5 %; 80 mg methylPREDNISolone acetate 40 MG/ML  Injected proximal ant tibial area Procedure, treatment alternatives, risks and benefits explained, specific risks discussed. Consent was given by the patient. Patient was prepped and draped in the usual sterile fashion.        Clinical Data: No additional findings.   Subjective: Chief Complaint  Patient presents with  . Left Knee - Follow-up  Patient is following up today for her bone scan results. She had her bone scan 02/13/18. Patient states that her knee seems to have improved slightly. She said that both knees ache at night. She is taking muscle relaxers and tylenol as needed.  HPI  Review of Systems   Objective: Vital Signs: BP (!) 166/90   Pulse 70   Ht 5\' 4"  (1.626 m)   Wt 256 lb (116.1 kg)   BMI 43.94 kg/m   Physical Exam Constitutional:      Appearance: She is well-developed.  Eyes:     Pupils: Pupils are equal, round, and reactive to light.  Pulmonary:     Effort: Pulmonary effort is normal.  Skin:    General: Skin is warm and dry.  Neurological:     Mental Status: She is alert and oriented to person, place, and time.  Psychiatric:        Behavior: Behavior normal.     Ortho Exam awake alert and oriented x3.  Comfortable sitting.  No pain about the right total knee replacement performed in 2015.  Full extension about 95 degrees of flexion.  Large thighs.  BMI is 44.  No effusion left knee knee was not hot warm or red.  Does  have some local tenderness along the proximal tibia laterally but no induration.  Neurologically appears to be intact.  No opening with a varus or valgus stress  Specialty Comments:  No specialty comments available.  Imaging: No results found.   PMFS History: Patient Active Problem List   Diagnosis Date Noted  . 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 Obsessive-compulsive disorder 10/10/2017  . Generalized anxiety disorder 10/10/2017  . Major depressive disorder, recurrent episode, moderate ([place]) 10/10/2017  . Hiatal hernia 03/03/2015  . Steatosis of liver 09/16/2014  . Obstructive sleep apnea syndrome 08/27/2014  . Pre-op evaluation 05/14/2014  . Osteoarthritis of left knee 10/17/2013  . 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  Hypercholesterolemia 08/05/2013  . Palpitations 08/05/2013  . HTN (hypertension) 08/05/2013  . 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 Preoperative cardiovascular examination 08/05/2013  . Degenerative arthritis of knee, bilateral 08/05/2013  . Painful total knee replacement ([place]) 11/03/2011  . Morbid obesity ([place]) 11/03/2011  . Hypothyroid 11/03/2011   Past Medical History:  Diagnosis Date  . Anginal pain ([place])    "on and off" (11/01/2011) - anxiety related  . Anxiety    takes Paxil daily and Xanax prn  . Arthritis    "knees" (10/16/2013)  . Chronic back pain    facet disease and bulging disc; "thoracic and lower back" (10/16/2013)  . Constipation    r/t pain meds;takes stool softener daily  . Depression   . Dysrhythmia    rapid HR on occasion-takes Propranolol daily  . Family history of anesthesia complication    ":PONV; mom and sisters"  . GERD (gastroesophageal reflux disease)    takes Prilosec   . H/O hiatal hernia   . History of bladder infections    > 54yr ago  . Hyperlipidemia    takes LIpitor daily  . Hypothyroidism    takes Synthroid daily  . Insomnia   . Joint pain   . Joint swelling   . Kidney stones   . Muscle spasms of head or neck    lumbar and thoracic;takes Robaxin prn  . Peripheral neuropathy   . PONV (postoperative nausea and vomiting)   . Rosacea   . Seasonal allergies    takes allegra prn    Family History  Problem Relation Age of Onset  . Heart disease Mother 2  . Heart failure Mother   . Heart attack Father   . Heart disease Father 55    Past Surgical History:  Procedure Laterality Date  . ABDOMINAL HYSTERECTOMY  1980's  . ANAL FISSURE REPAIR  ~ 2011   "banded hemorrhoids at this time too"  . BARIATRIC SURGERY    . CARDIAC CATHETERIZATION  ~ 1999  . CARPAL TUNNEL RELEASE Left 09/13/2017   & ulnar nerve release  . CERVICAL DISC SURGERY  03/2017   C5, C6   . COLONOSCOPY    .  ESOPHAGOGASTRODUODENOSCOPY    . JOINT REPLACEMENT     left knee  . KNEE ARTHROPLASTY Left 2007  . KNEE ARTHROSCOPY Right   . KNEE ARTHROSCOPY Right    "torn meniscus"  . KNEE SURGERY Left 1982-2013   "17 before 10/31/2013"  . LEFT OOPHORECTOMY  1980's  . PATELLECTOMY Right   . PLANTAR FASCIA SURGERY Bilateral 1990's  . REVISION TOTAL KNEE ARTHROPLASTY Left 11/01/2011  . TOTAL KNEE ARTHROPLASTY Right 10/15/2013   Procedure: RIGHT TOTAL KNEE ARTHROPLASTY;  Surgeon: Garald Balding, MD;  Location: [facility];  Service: Orthopedics;  Laterality: Right;  . TOTAL KNEE REVISION  11/01/2011   Procedure: TOTAL KNEE REVISION;  Surgeon: Garald Balding, MD;  Location: [facility];  Service: Orthopedics;  Laterality: Left;  Revision Left Total Knee Replacement   . TUBAL LIGATION  1981   Social History   Occupational History  . Not on file  Tobacco Use  . Smoking status: Never Smoker  . Smokeless tobacco: Never Used  Substance and Sexual Activity  . Alcohol use: No  . Drug use: No  . Sexual activity: Yes    Birth control/protection: Surgical

## 2018-02-19 NOTE — Telephone Encounter (Signed)
Patient left a voicemail requesting a referral for a bone density scan.

## 2018-02-20 ENCOUNTER — Other Ambulatory Visit (INDEPENDENT_AMBULATORY_CARE_PROVIDER_SITE_OTHER): Payer: Self-pay | Admitting: Orthopaedic Surgery

## 2018-02-20 DIAGNOSIS — Z780 Asymptomatic menopausal state: Secondary | ICD-10-CM

## 2018-02-20 NOTE — Telephone Encounter (Signed)
Is this okay to order? Thanks!

## 2018-02-20 NOTE — Telephone Encounter (Signed)
Sure

## 2018-02-20 NOTE — Progress Notes (Signed)
de

## 2018-02-27 ENCOUNTER — Other Ambulatory Visit: Payer: Self-pay | Admitting: Psychiatry

## 2018-02-27 NOTE — Telephone Encounter (Signed)
Not seen in epic yet need to review paper chart

## 2018-02-28 ENCOUNTER — Ambulatory Visit
Admission: RE | Admit: 2018-02-28 | Discharge: 2018-02-28 | Disposition: A | Discharge status: Home / Self Care | Payer: Medicare Other | Source: Ambulatory Visit | Attending: Orthopaedic Surgery | Admitting: Orthopaedic Surgery

## 2018-02-28 DIAGNOSIS — Z780 Asymptomatic menopausal state: Secondary | ICD-10-CM

## 2018-03-02 ENCOUNTER — Ambulatory Visit: Payer: Medicare Other | Admitting: Psychiatry

## 2018-03-06 ENCOUNTER — Encounter (INDEPENDENT_AMBULATORY_CARE_PROVIDER_SITE_OTHER): Payer: Self-pay | Admitting: Orthopaedic Surgery

## 2018-03-06 ENCOUNTER — Ambulatory Visit (INDEPENDENT_AMBULATORY_CARE_PROVIDER_SITE_OTHER): Payer: Medicare Other | Admitting: Orthopaedic Surgery

## 2018-03-06 VITALS — BP 145/68 | HR 70 | Ht 64.0 in | Wt 255.0 lb

## 2018-03-06 DIAGNOSIS — Z96652 Presence of left artificial knee joint: Secondary | ICD-10-CM

## 2018-03-06 DIAGNOSIS — G8929 Other chronic pain: Secondary | ICD-10-CM | POA: Diagnosis not present

## 2018-03-06 DIAGNOSIS — M25562 Pain in left knee: Secondary | ICD-10-CM

## 2018-03-06 MED ORDER — LIDOCAINE HCL 1 % IJ SOLN
2.0000 mL | INTRAMUSCULAR | Status: AC | PRN
  Administered 2018-03-06: 2 mL

## 2018-03-06 MED ORDER — BUPIVACAINE HCL 0.5 % IJ SOLN
2.0000 mL | INTRAMUSCULAR | Status: AC | PRN
  Administered 2018-03-06: 2 mL via INTRA_ARTICULAR

## 2018-03-06 MED ORDER — METHYLPREDNISOLONE ACETATE 40 MG/ML IJ SUSP
80.0000 mg | INTRAMUSCULAR | Status: AC | PRN
  Administered 2018-03-06: 80 mg via INTRA_ARTICULAR

## 2018-03-06 NOTE — Progress Notes (Signed)
Office Visit Note   Patient: [name]           Date of Birth: 1955/08/17           MRN: [id number] Visit Date: 03/06/2018              Requested by: Shirline Frees, MD [address] [place] [place], [place] [address] PCP: Shirline Frees, MD   Assessment & Plan: Visit Diagnoses:  1. Chronic pain of left knee   2. History of left knee replacement     Plan: History of left total knee replacement with revision approximately 6 years ago.  Has had some recent onset of pain.  Bone scan revealed some increased uptake along the lateral tibial plateau.  I injected that when she was here several weeks ago when she is not sure it made much of a difference.  I have also aspirated the knee  revealing some calcium pyrophosphate crystals.  She is experiencing some mechanical symptoms that appears to be related to the polyethylene component.  She is having a clunking feeling.  I be able to fully extend her knee and flex about 95 she does have large knees.  BMI is 43.  At this point I think it is worth injecting the knee joint with cortisone as I injected the extra-articular portion laterally previously much relief.  I also think it is worth working on her weight and exercising off her feet i.e. bicycle, machines, swimming.  And then reevaluate in several months.  Another option would be to reexplore her knee consider exchange of the polyethylene components.  Long discussion over 30 minutes with each of those  Follow-Up Instructions: Return in about 2 months (around 05/06/2018).   Orders:  Orders Placed This Encounter  Procedures  . Large Joint Inj: L knee   No orders of the defined types were placed in this encounter.     Procedures: Large Joint Inj: L knee on 03/06/2018 3:20 PM Indications: pain and diagnostic evaluation Details: 25 G 1.5 in needle, anteromedial approach  Arthrogram: No  Medications: 2 mL lidocaine 1 %; 2 mL bupivacaine 0.5 %; 80 mg methylPREDNISolone acetate  40 MG/ML Procedure, treatment alternatives, risks and benefits explained, specific risks discussed. Consent was given by the patient. Patient was prepped and draped in the usual sterile fashion.       Clinical Data: No additional findings.   Subjective: Chief Complaint  Patient presents with  . Left Knee - Follow-up  . Right Knee - Follow-up  Patient presents today for a two week follow up for both knees. She had a left knee cortisone injection on 02/19/2018. She did not noticed any improvement with the injection. She is taking tylenol, peppermint oils, and ice as needed.  No related fever or chills.  Does feel some clunking and mechanical issues with her left knee.  She is not having any problems on the right.  She is not diabetic  HPI  Review of Systems   Objective: Vital Signs: BP (!) 145/68   Pulse 70   Ht 5\' 4"  (1.626 m)   Wt 255 lb (115.7 kg)   BMI 43.77 kg/m   Physical Exam Constitutional:      Appearance: She is well-developed.  Eyes:     Pupils: Pupils are equal, round, and reactive to light.  Pulmonary:     Effort: Pulmonary effort is normal.  Skin:    General: Skin is warm and dry.  Neurological:  Mental Status: She is alert and oriented to person, place, and time.  Psychiatric:        Behavior: Behavior normal.     Ortho Exam awake alert and oriented x3 comfortable sitting.  Left knee was not hot red warm and swollen.  Large legs.  Full extension and flexed about 95 degrees 1 calf with touch thigh.  Does open all bit medially and laterally with a valgus and varus stress.  Minimally positive anterior drawer sign.  No obvious effusion.  Certainly has been experiencing some "clunking sensation which I could reproduce to some extent.  Specialty Comments:  No specialty comments available.  Imaging: No results found.   PMFS History: Patient Active Problem List   Diagnosis Date Noted  . 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 Obsessive-compulsive  disorder 10/10/2017  . Generalized anxiety disorder 10/10/2017  . Major depressive disorder, recurrent episode, moderate ([place]) 10/10/2017  . Hiatal hernia 03/03/2015  . Steatosis of liver 09/16/2014  . Obstructive sleep apnea syndrome 08/27/2014  . Pre-op evaluation 05/14/2014  . Osteoarthritis of left knee 10/17/2013  . History of left knee replacement 10/15/2013  . Hypercholesterolemia 08/05/2013  . Palpitations 08/05/2013  . HTN (hypertension) 08/05/2013  . 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 Preoperative cardiovascular examination 08/05/2013  . Degenerative arthritis of knee, bilateral 08/05/2013  . Chronic pain of left knee 11/03/2011  . Morbid obesity ([place]) 11/03/2011  . Hypothyroid 11/03/2011   Past Medical History:  Diagnosis Date  . Anginal pain ([place])    "on and off" (11/01/2011) - anxiety related  . Anxiety    takes Paxil daily and Xanax prn  . Arthritis    "knees" (10/16/2013)  . Chronic back pain    facet disease and bulging disc; "thoracic and lower back" (10/16/2013)  . Constipation    r/t pain meds;takes stool softener daily  . Depression   . Dysrhythmia    rapid HR on occasion-takes Propranolol daily  . Family history of anesthesia complication    ":PONV; mom and sisters"  . GERD (gastroesophageal reflux disease)    takes Prilosec   . H/O hiatal hernia   . History of bladder infections    > 28yr ago  . Hyperlipidemia    takes LIpitor daily  . Hypothyroidism    takes Synthroid daily  . Insomnia   . Joint pain   . Joint swelling   . Kidney stones   . Muscle spasms of head or neck    lumbar and thoracic;takes Robaxin prn  . Peripheral neuropathy   . PONV (postoperative nausea and vomiting)   . Rosacea   . Seasonal allergies    takes allegra prn    Family History  Problem Relation Age of Onset  . Heart disease Mother 35  . Heart failure Mother   . Heart attack Father   . Heart disease Father 60     Past Surgical History:  Procedure Laterality Date  . ABDOMINAL HYSTERECTOMY  1980's  . ANAL FISSURE REPAIR  ~ 2011   "banded hemorrhoids at this time too"  . BARIATRIC SURGERY    . CARDIAC CATHETERIZATION  ~ 1999  . CARPAL TUNNEL RELEASE Left 09/13/2017   & ulnar nerve release  . CERVICAL DISC SURGERY  03/2017   C5, C6   . COLONOSCOPY    . ESOPHAGOGASTRODUODENOSCOPY    . JOINT REPLACEMENT     left knee  . KNEE ARTHROPLASTY Left 2007  . KNEE  ARTHROSCOPY Right   . KNEE ARTHROSCOPY Right    "torn meniscus"  . KNEE SURGERY Left 1982-2013   "17 before 10/31/2013"  . LEFT OOPHORECTOMY  1980's  . PATELLECTOMY Right   . PLANTAR FASCIA SURGERY Bilateral 1990's  . REVISION TOTAL KNEE ARTHROPLASTY Left 11/01/2011  . TOTAL KNEE ARTHROPLASTY Right 10/15/2013   Procedure: RIGHT TOTAL KNEE ARTHROPLASTY;  Surgeon: Garald Balding, MD;  Location: [facility];  Service: Orthopedics;  Laterality: Right;  . TOTAL KNEE REVISION  11/01/2011   Procedure: TOTAL KNEE REVISION;  Surgeon: Garald Balding, MD;  Location: [facility];  Service: Orthopedics;  Laterality: Left;  Revision Left Total Knee Replacement   . TUBAL LIGATION  1981   Social History   Occupational History  . Not on file  Tobacco Use  . Smoking status: Never Smoker  . Smokeless tobacco: Never Used  Substance and Sexual Activity  . Alcohol use: No  . Drug use: No  . Sexual activity: Yes    Birth control/protection: Surgical

## 2018-03-16 ENCOUNTER — Ambulatory Visit: Payer: Medicare Other | Admitting: Psychiatry

## 2018-03-23 ENCOUNTER — Ambulatory Visit: Payer: Medicare Other | Admitting: Psychiatry

## 2018-04-10 ENCOUNTER — Other Ambulatory Visit: Payer: Self-pay | Admitting: Student

## 2018-04-10 DIAGNOSIS — M5416 Radiculopathy, lumbar region: Secondary | ICD-10-CM

## 2018-04-16 ENCOUNTER — Other Ambulatory Visit: Payer: Self-pay | Admitting: Psychiatry

## 2018-04-17 NOTE — Telephone Encounter (Signed)
Pt requesting refill on her Paxil and Mirapex. Fill at the [facility] please. Appt scheduled for 5/11 with CC.

## 2018-04-18 ENCOUNTER — Telehealth: Payer: Self-pay | Admitting: Psychiatry

## 2018-04-18 NOTE — Telephone Encounter (Signed)
Submitted yesterday it looks like, needs to check with pharmacy

## 2018-04-18 NOTE — Telephone Encounter (Signed)
ERROR

## 2018-04-18 NOTE — Telephone Encounter (Signed)
[name] has called for a third time requesting Paxil and her Mirapex.Stated she hasn't slept in two days due to not having the restless leg medication. Please advise.

## 2018-04-19 ENCOUNTER — Ambulatory Visit (INDEPENDENT_AMBULATORY_CARE_PROVIDER_SITE_OTHER): Payer: Medicare Other | Admitting: Psychiatry

## 2018-04-19 ENCOUNTER — Other Ambulatory Visit: Payer: Self-pay

## 2018-04-19 DIAGNOSIS — F341 Dysthymic disorder: Secondary | ICD-10-CM | POA: Diagnosis not present

## 2018-04-19 DIAGNOSIS — F411 Generalized anxiety disorder: Secondary | ICD-10-CM

## 2018-04-19 DIAGNOSIS — F3340 Major depressive disorder, recurrent, in remission, unspecified: Secondary | ICD-10-CM

## 2018-04-19 DIAGNOSIS — F429 Obsessive-compulsive disorder, unspecified: Secondary | ICD-10-CM

## 2018-04-19 NOTE — Progress Notes (Signed)
Psychotherapy Progress Note [facility] [name], PhD LP  Patient ID: [name]     MRN: [id number]     Therapy format: Individual psychotherapy Date: 04/19/2018     Start: 1:14p Stop: 2:01p Time Spent: 47 min  I connected with patient by a video enabled telemedicine application or telephone, with her informed consent, and verified patient privacy and that I am speaking with the correct person using two identifiers.  I was located at home and patient at home.  Required 5 minutes time to get acquainted with technology, following audio-only connection.  Session narrative -- presenting needs, interim history, self-report of stressors and symptoms, applications of prior therapy, status changes, and interventions made in session Since last seen 2 months ago, been dealing with flareup in pain from both knee replacements going bad.  Excessive low back pain added to it. MRI and possible surgery both delayed by COVID concerns, but intensified enough to move up MRI to tomorrow.  Steroid pills punching up anxiety, requires more Xanax to treated the side effects, worries that combining it with muscle relaxants.  Grateful to [place] in the office for chasing down two meds that ran out.    Feels weak for complaining and crying, but reassured that it is not weak of her but human and normal in her circumstances.   OCD tried to get her doubtful about whether she might have cancer instead, but PT appealed to her notes and appropriately asked OCD, "What if it's NOT that?", which opened up possibility and broke the spell.  Reinforced her asking the right question rather than seeking the right answer and exercising her cognitive authority.  Harder dealing with scrupulosity while pain and contagion anxieties ramp up, also normalized in session.  Gets intrusive thoughts of having to do certain things 3x to be saved.  Renewed challenge about the identity of the voice telling her that, the  one she tends to fear is God's wrath, and whether that is truly conscience or OCD "impersonating. God.  Is doing her relaxing breathing, which helps anxiety and back tension/pain.  Educated that OCD "feeds" on uncertainty of all kinds, so it is naturally pressing harder right now, but just because it feels pushier doesn't mean anything more about salvation, or God's regard for her, etc.  Led to imagine a fictional or public figure whom she mistrusts (and does not expect to have to) to personify her OCD (she chose [name]) and oriented to using the image and the name ("[name]" for short) to take a stand against believing and complying with compulsions.  Crucial to resist doing the compulsion and to "do the taboo" some with numbers when number compulsions arise.  Finds it intriguing and useful.  For motivation's sake, reframed any discomfort she has as her "moment", actually -- the very moment she is making the change in her nervous system, retraining it to let go of OCD habits and return personal liberty to behave as she can.  Agrees, will pursue.  Encouraged stretching as recommended by physician and son, who is a P.T. to treat pain, tension, and improve overall wellbeing and ego strength.  Therapeutic modalities: Cognitive Behavioral Therapy, Gestalt/Psychodrama, Psycho-education/Bibliotherapy and faith-senstive  Mental Status/Observations:  Appearance:   Casual     Behavior:  Appropriate  Motor:  Normal  Speech/Language:   Clear and Coherent  Affect:  Appropriate and worrisome, but less  Mood:  anxious  Thought process:  normal  Thought content:  Obsessions and somewhat less tenacious  Sensory/Perceptual disturbances:    WNL  Orientation:  within normal limits  Attention:  Good  Concentration:  Good  Memory:  grossly intact  Insight:    Fair  Judgment:   Fair  Impulse Control:  Good   Risk Assessment: Danger to Self:  No Self-injurious Behavior: No Danger to Others: No Duty to  Warn:no Physical Aggression / Violence:No  Access to Firearms a concern: No   Diagnosis:   ICD-10-CM   1. Generalized anxiety disorder F41.1   2. Obsessive-compulsive disorder, unspecified type F42.9   3. Early onset dysthymia F34.1   4. Recurrent major depressive disorder, in remission ([place]) F33.40    Assessment of progress:  stable  Plan:  . Use personification and talkback to OCD to support more objective response . Continue to question the source of compulsions, compare to the understood character of God . Apply physical tactics further -- stretching, relaxation amid taking care of back pain . Self-affirm that crying is not weakness but self-regulation, whenever needed or unavoidable . Other recommendations/advice as noted above . Continue to utilize previously learned skills ad lib . Maintain medication as prescribed and work faithfully with relevant prescriber(s) if any changes are desired or seem indicated . Call the clinic on-call service, present to ER, or call 911 if any life-threatening psychiatric crisis Return in about 2 weeks (around 05/03/2018) for set up as teletherapy session.   [name], PhD [place] Licensed Psychologist

## 2018-04-20 ENCOUNTER — Other Ambulatory Visit: Payer: Self-pay

## 2018-04-20 ENCOUNTER — Ambulatory Visit
Admission: RE | Admit: 2018-04-20 | Discharge: 2018-04-20 | Disposition: A | Discharge status: Home / Self Care | Payer: Medicare Other | Source: Ambulatory Visit | Attending: Student | Admitting: Student

## 2018-04-20 DIAGNOSIS — M5416 Radiculopathy, lumbar region: Secondary | ICD-10-CM

## 2018-04-27 ENCOUNTER — Other Ambulatory Visit: Payer: Self-pay | Admitting: Student

## 2018-04-27 DIAGNOSIS — M5416 Radiculopathy, lumbar region: Secondary | ICD-10-CM

## 2018-05-03 ENCOUNTER — Other Ambulatory Visit: Payer: Self-pay

## 2018-05-03 ENCOUNTER — Ambulatory Visit (INDEPENDENT_AMBULATORY_CARE_PROVIDER_SITE_OTHER): Payer: Medicare Other | Admitting: Psychiatry

## 2018-05-03 DIAGNOSIS — F411 Generalized anxiety disorder: Secondary | ICD-10-CM

## 2018-05-03 DIAGNOSIS — M170 Bilateral primary osteoarthritis of knee: Secondary | ICD-10-CM | POA: Diagnosis not present

## 2018-05-03 DIAGNOSIS — M546 Pain in thoracic spine: Secondary | ICD-10-CM

## 2018-05-03 DIAGNOSIS — F341 Dysthymic disorder: Secondary | ICD-10-CM | POA: Diagnosis not present

## 2018-05-03 DIAGNOSIS — F429 Obsessive-compulsive disorder, unspecified: Secondary | ICD-10-CM | POA: Diagnosis not present

## 2018-05-03 NOTE — Progress Notes (Signed)
Psychotherapy Progress Note [facility] [name], PhD LP  Patient ID: [name]     MRN: [id number]     Therapy format: Individual psychotherapy Date: 05/03/2018     Start: 1:15p Stop: 2:10p Time Spent: 40 min (minus tech delay)  Telehealth visit I connected with patient by a video enabled telemedicine/telehealth application or telephone, with her informed consent, and verified patient privacy and that I am speaking with the correct person using two identifiers.  I was located at my home and patient at her home.  We discussed the limitations, risks, and security and privacy concerns associated with telehealth services and the availability of in-person appointments, including awareness that she may be responsible for charges related to the service, and she expressed understanding and agreed to proceed.  I discussed treatment planning with her, with opportunity to ask and answer all questions. Agreed with the plan, demonstrated an understanding of the instructions, and made her aware to call our office if symptoms worsen or she feels she is in a crisis state and needs immediate contact.  Difficulty establishing session -- Webex email invitation unanswered, call from Webex unanswered at listed phone ([phone] number), direct text unanswered, numbers checked in EHR and repeat call to listed preferred mobile number ([phone] number) still no answer, direct texted, instant messaged by office that PT calling, PT reached audio-only, redirected to her email and video connection, which also hit snags figuring out whether she had Webex on phone or computer.  All told, about 15 min technological delay.  Session narrative (presenting needs, interim history, self-report of stressors and symptoms, applications of prior therapy, status changes, and interventions made in session) Dealing with continued aggressive back pain and medication.  Is recommended for a myelogram, as her pain is  right-sided, while known nerve impingement is on the left.  And myelogram has to wait for reopening from [place].  The mystery of the pain, triggers OCD about grave diseases.  Advised to make sure she has a plausible NON-catastrophic explanation to compete with cancer, gall bladder, etc.  Educated on spinal architecture and function, suggested off-side pain problem most likely a result of pinching above the level she knows of from postural compensation and tightening back in response to pain.  Advised non-weightbearing stretching and flexibility exercises as tolerated to free up and align lumbar and thoracic spine, improve circulation and freedom of movement.  Would like husband to get education from [place] in [place].  Gave her a thumbnail description he might digest and encouraged to tell him, including why it would be worse now but no more of a fundamental problem.  Took notes.  Wants to know if she should go up on Paxil -- told it is optional, an issue of two "right" answers.  OK to do so for situational relief while OCD exacerbated and come back down later or look at standing pat as an exercise in cognitive-behavioral coping.  Med check May 12.  Wants to know natural helps for anxiety -- advised magnesium supplementation via pill or Epson salt soaks.  Very unlikely to react with anything she is taking, common deficiency in [place] diet.  Therapeutic modalities: Cognitive Behavioral Therapy and Assertiveness/Communication  Mental Status/Observations:  Appearance:   Casual     Behavior:  Appropriate  Motor:  Normal  Speech/Language:   Clear and Coherent  Affect:  Appropriate and responsive to humor  Mood:  euthymic and except areas of anxiety -- mostly pain  Thought process:  normal  Thought content:    WNL in person, refers to obsessions outside session  Sensory/Perceptual disturbances:    WNL  Orientation:  grossly intact  Attention:  Good  Concentration:  Good  Memory:  grossly intact, can be  spotty under influence of pain or meds  Insight:    Fair  Judgment:   Good  Impulse Control:  Good   Risk Assessment: Danger to Self:  No Self-injurious Behavior: No Danger to Others: No Duty to Warn:no Physical Aggression / Violence:No  Access to Firearms a concern: No   Diagnosis:   ICD-10-CM   1. Obsessive-compulsive disorder, unspecified type F42.9   2. 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 Primary osteoarthritis of both knees M17.0   5. Right-sided thoracic back pain, unspecified chronicity M54.6    Assessment of progress:  stable  Plan:  . Self-affirm likely story of back pain and represent to orthopedist when appropriate need for help limbering and aligning back . Use non-weightbearing stretches as tolerated to improve back condition . If explaining OCD to husband, focus as directed on metaphor and how he knows it doesn't make sense, just "loud" and old habit . Other recommendations/advice as noted above . Continue to utilize previously learned skills ad lib . Maintain medication as prescribed and work faithfully with relevant prescriber(s) if any changes are desired or seem indicated . Call the clinic on-call service, present to ER, or call 911 if any life-threatening psychiatric crisis Return in about 2 weeks (around 05/17/2018) for will call.   [name], PhD [place] Licensed Psychologist

## 2018-05-14 ENCOUNTER — Other Ambulatory Visit: Payer: Self-pay | Admitting: Psychiatry

## 2018-05-14 ENCOUNTER — Ambulatory Visit (INDEPENDENT_AMBULATORY_CARE_PROVIDER_SITE_OTHER): Payer: Medicare Other | Admitting: Psychiatry

## 2018-05-14 ENCOUNTER — Other Ambulatory Visit: Payer: Self-pay

## 2018-05-14 ENCOUNTER — Telehealth: Payer: Self-pay | Admitting: Psychiatry

## 2018-05-14 ENCOUNTER — Encounter: Payer: Self-pay | Admitting: Psychiatry

## 2018-05-14 DIAGNOSIS — F411 Generalized anxiety disorder: Secondary | ICD-10-CM | POA: Diagnosis not present

## 2018-05-14 DIAGNOSIS — F429 Obsessive-compulsive disorder, unspecified: Secondary | ICD-10-CM | POA: Diagnosis not present

## 2018-05-14 DIAGNOSIS — G2581 Restless legs syndrome: Secondary | ICD-10-CM

## 2018-05-14 DIAGNOSIS — F3341 Major depressive disorder, recurrent, in partial remission: Secondary | ICD-10-CM | POA: Diagnosis not present

## 2018-05-14 MED ORDER — ALPRAZOLAM 0.5 MG PO TABS: 0.2500 mg | ORAL_TABLET | Freq: Three times a day (TID) | ORAL | 3 refills | Status: DC | PRN

## 2018-05-14 MED ORDER — PRAMIPEXOLE DIHYDROCHLORIDE 0.25 MG PO TABS: ORAL_TABLET | ORAL | 1 refills | Status: DC

## 2018-05-14 MED ORDER — ALPRAZOLAM 0.5 MG PO TABS: 0.2500 mg | ORAL_TABLET | Freq: Three times a day (TID) | ORAL | 1 refills | Status: DC | PRN

## 2018-05-14 MED ORDER — PROPRANOLOL HCL ER 60 MG PO CP24: 60.0000 mg | ORAL_CAPSULE | Freq: Every day | ORAL | 1 refills | Status: DC

## 2018-05-14 MED ORDER — PROPRANOLOL HCL 60 MG PO TABS: 60.0000 mg | ORAL_TABLET | Freq: Every day | ORAL | 1 refills | Status: DC

## 2018-05-14 NOTE — Telephone Encounter (Signed)
[place] called about [name]'s Xanax prescription.  It shows 0.5 mg (0.25mg  total) taking 3 q day prn for #1 with 1 refill.  Why only 1 tablet?  And on the propranolol did you mean ER?  Please resubmit with corrections.

## 2018-05-14 NOTE — Progress Notes (Signed)
Correction of dosing quantity of Xanax and propranolol should have been ER instead of immediate release.  Correction sent

## 2018-05-14 NOTE — Progress Notes (Signed)
[name] [id number] Jun 05, 1955 63 y.o.  Virtual Visit via Telephone Note  I connected with@ on 05/14/18 at  2:45 PM EDT by telephone and verified that I am speaking with the correct person using two identifiers.   I discussed the limitations, risks, security and privacy concerns of performing an evaluation and management service by telephone and the availability of in person appointments. I also discussed with the patient that there may be a patient responsible charge related to this service. The patient expressed understanding and agreed to proceed.   I discussed the assessment and treatment plan with the patient. The patient was provided an opportunity to ask questions and all were answered. The patient agreed with the plan and demonstrated an understanding of the instructions.   The patient was advised to call back or seek an in-person evaluation if the symptoms worsen or if the condition fails to improve as anticipated.  I provided 20 minutes of non-face-to-face time during this encounter.  The patient was located at home.  The provider was located at home.   Purnell Shoemaker, MD   Subjective:   Patient ID:  [name] is a 63 y.o. (DOB [birth year]) female.  Chief Complaint:  Chief Complaint  Patient presents with  . Follow-up    Medication Management  . Anxiety    Increased  . Depression    Inceeased    HPI [name] presents for follow-up of treatment resistant OCD, major depression, and restless leg syndrome.  Last seen May 2019.  She had accidentally reduced her paroxetine from 60 mg to 40 mg and it was increased back to the appropriate 60 mg dose.  She never increased the dosage.  She is continued in therapy with Dr. Faylene Kurtz.  Been doing pretty good for the most part, but more anxious lately.  Nervous facing knee and possible back surgery.  Also working on obsessions with Dr. Rica Mote.  She wonders about increasing the Paxil.   She never increased the Paxil to 60 mg daily.  Disc pros and cons of this.  Needing more Xanax but it's not a high dosage..  Patient reports stable mood and denies depressed or irritable moods.  She has a history of severe depression but that is under control.  Patient denies difficulty with sleep initiation or maintenance. Denies appetite disturbance.  Patient reports that energy and motivation have been good.  Patient denies any difficulty with concentration.  Patient denies any suicidal ideation.   Past Psychiatric Medication Trials: Paroxetine, venlafaxine, clomipramine, Lexapro 40 no response, sertraline, duloxetine, fluvoxamine, pindolol, Wellbutrin 300 panic, Viibryd Abilify, Risperdal, Geodon 40 twice daily side effects, Seroquel, Latuda, Rexulti, N-acetylcysteine, Deplin, clonidine, Fetzima, Saphris, gabapentin side effects, lamotrigine, buspirone, pramipexole, Xanax  Review of Systems:  Review of Systems  Musculoskeletal: Positive for back pain, gait problem and joint swelling.  Neurological: Negative for tremors and weakness.    Medications: I have reviewed the patient's current medications.  Current Outpatient Medications  Medication Sig Dispense Refill  . ALPRAZolam (XANAX) 0.5 MG tablet Take 0.5 tablets (0.25 mg total) by mouth 3 (three) times daily as needed for anxiety. 1 tablet 1  . Ascorbic Acid (VITAMIN C) 1000 MG tablet Take 1,000 mg by mouth daily.    . Cholecalciferol (VITAMIN D-3) 5000 UNITS TABS Take 5,000 Units by mouth daily.    Marland Kitchen ezetimibe (ZETIA) 10 MG tablet     . fexofenadine (ALLEGRA) 180 MG tablet Take 180 mg by mouth daily.    Marland Kitchen  lidocaine (LIDODERM) 5 % Place 1 patch onto the skin every 12 (twelve) hours as needed (knee, back). Remove & Discard patch within 12 hours or as directed by MD    . methocarbamol (ROBAXIN) 500 MG tablet TAKE 1 TABLET BY MOUTH 3 TIMES A DAY AS NEEDED FOR MUSCLE SPASMS.  2  . Multiple Vitamin (MULTIVITAMIN) tablet Take 1 tablet by  mouth daily. Reported on 04/28/2015    . PARoxetine (PAXIL) 20 MG tablet TAKE 1 TABLET BY MOUTH TWICE DAILY AND THEN 1 TABLET BY MOUTH AT BEDTIME *NEED TO SCHEDULE FOLLOW UP VISIT* (Patient taking differently: Take 20 mg by mouth 2 (two) times a day. ) 90 tablet 0  . pramipexole (MIRAPEX) 0.25 MG tablet 1 in the morning and 1 1/2 tablets at night 225 tablet 1  . propranolol (INDERAL) 60 MG tablet Take 1 tablet (60 mg total) by mouth daily. 90 tablet 1  . levothyroxine (SYNTHROID, LEVOTHROID) 150 MCG tablet Take 175 mcg by mouth daily before breakfast.     . PARoxetine (PAXIL-CR) 37.5 MG 24 hr tablet Take 37.5 mg by mouth every morning.     No current facility-administered medications for this visit.     Medication Side Effects: None  Allergies:  Allergies  Allergen Reactions  . Morphine And Related Other (See Comments)    Feels like bugs crawling over her  . Nsaids Hives  . Adhesive [Tape]   . Ciprofloxacin     Upset stomach   . Codeine     Hyper  . Gabapentin   . Ibuprofen Hives  . Lodine [Etodolac]     Upset Stomach   . Lyrica [Pregabalin]   . Naproxen Hives  . Niacin And Related     flushing  . Pravastatin     achiness   . Septra [Sulfamethoxazole-Trimethoprim]     Stomach pains  . Tylox [Oxycodone-Acetaminophen]     Hyper  . Wellbutrin [Bupropion]     dizzy  . Buspar [Buspirone] Palpitations    Palpitations    Past Medical History:  Diagnosis Date  . Anginal pain ([place])    "on and off" (11/01/2011) - anxiety related  . Anxiety    takes Paxil daily and Xanax prn  . Arthritis    "knees" (10/16/2013)  . Chronic back pain    facet disease and bulging disc; "thoracic and lower back" (10/16/2013)  . Constipation    r/t pain meds;takes stool softener daily  . Depression   . Dysrhythmia    rapid HR on occasion-takes Propranolol daily  . Family history of anesthesia complication    ":PONV; mom and sisters"  . GERD (gastroesophageal reflux disease)    takes  Prilosec   . H/O hiatal hernia   . History of bladder infections    > 17yr ago  . Hyperlipidemia    takes LIpitor daily  . Hypothyroidism    takes Synthroid daily  . Insomnia   . Joint pain   . Joint swelling   . Kidney stones   . Muscle spasms of head or neck    lumbar and thoracic;takes Robaxin prn  . Peripheral neuropathy   . PONV (postoperative nausea and vomiting)   . Rosacea   . Seasonal allergies    takes allegra prn    Family History  Problem Relation Age of Onset  . Heart disease Mother 14  . Heart failure Mother   . Heart attack Father   . Heart disease Father 56    Social  History   Socioeconomic History  . Marital status: Married    Spouse name: Not on file  . Number of children: Not on file  . Years of education: Not on file  . Highest education level: Not on file  Occupational History  . Not on file  Social Needs  . Financial resource strain: Not on file  . Food insecurity:    Worry: Not on file    Inability: Not on file  . Transportation needs:    Medical: Not on file    Non-medical: Not on file  Tobacco Use  . Smoking status: Never Smoker  . Smokeless tobacco: Never Used  Substance and Sexual Activity  . Alcohol use: No  . Drug use: No  . Sexual activity: Yes    Birth control/protection: Surgical  Lifestyle  . Physical activity:    Days per week: Not on file    Minutes per session: Not on file  . Stress: Not on file  Relationships  . Social connections:    Talks on phone: Not on file    Gets together: Not on file    Attends religious service: Not on file    Active member of club or organization: Not on file    Attends meetings of clubs or organizations: Not on file    Relationship status: Not on file  . Intimate partner violence:    Fear of current or ex partner: Not on file    Emotionally abused: Not on file    Physically abused: Not on file    Forced sexual activity: Not on file  Other Topics Concern  . Not on file  Social  History Narrative  . Not on file    Past Medical History, Surgical history, Social history, and Family history were reviewed and updated as appropriate.   Please see review of systems for further details on the patient's review from today.   Objective:   Physical Exam:  There were no vitals taken for this visit.  Physical Exam Neurological:     Mental Status: She is alert and oriented to person, place, and time.     Cranial Nerves: No dysarthria.  Psychiatric:        Attention and Perception: Attention normal.        Mood and Affect: Mood is anxious. Mood is not depressed.        Speech: Speech normal.        Behavior: Behavior is cooperative.        Thought Content: Thought content normal. Thought content is not paranoid or delusional. Thought content does not include homicidal or suicidal ideation. Thought content does not include homicidal or suicidal plan.        Cognition and Memory: Cognition and memory normal.        Judgment: Judgment normal.     Comments: Residual sig obsessions and comnpulsive prayer she's addressing in therapy.     Lab Review:     Component Value Date/Time   NA 138 10/17/2013 0515   K 4.5 10/17/2013 0515   CL 102 10/17/2013 0515   CO2 26 10/17/2013 0515   GLUCOSE 147 (H) 10/17/2013 0515   BUN 11 10/17/2013 0515   CREATININE 0.90 10/17/2013 0515   CALCIUM 8.9 10/17/2013 0515   PROT 7.7 10/08/2013 1050   ALBUMIN 3.9 10/08/2013 1050   AST 23 10/08/2013 1050   ALT 17 10/08/2013 1050   ALKPHOS 72 10/08/2013 1050   BILITOT 0.4 10/08/2013 1050  GFRNONAA 69 (L) 10/17/2013 0515   GFRAA 80 (L) 10/17/2013 0515       Component Value Date/Time   WBC 5.5 01/25/2018 1202   RBC 4.22 01/25/2018 1202   HGB 13.3 01/25/2018 1202   HCT 39.5 01/25/2018 1202   PLT 378 01/25/2018 1202   MCV 93.6 01/25/2018 1202   MCH 31.5 01/25/2018 1202   MCHC 33.7 01/25/2018 1202   RDW 13.4 01/25/2018 1202   LYMPHSABS 1,672 01/25/2018 1202   MONOABS 0.5 10/08/2013  1050   EOSABS 149 01/25/2018 1202   BASOSABS 61 01/25/2018 1202    No results found for: POCLITH, LITHIUM   No results found for: PHENYTOIN, PHENOBARB, VALPROATE, CBMZ   .res Assessment: Plan:    Obsessive-compulsive disorder, unspecified type  Generalized anxiety disorder - Plan: ALPRAZolam (XANAX) 0.5 MG tablet, propranolol (INDERAL) 60 MG tablet  Restless legs syndrome - Plan: pramipexole (MIRAPEX) 0.25 MG tablet  Recurrent major depression in partial remission ([place])   She overall has been satisfied with the meds in spite of being on a relatively low dose of paroxetine 40 mg given her history of treatment resistant OCD.  She is making progress in therapy that is helping.  She asks about possibly increasing the paroxetine back to 60 mg which she is taken before given the additional stressors of pending surgery.  Obviously I think that is a reasonable thing to do.     That may also decrease her need for the extra Xanax that she has been taking lately.  There is no evidence for any Xanax abuse or overuse or misuse nor tolerance nor dependence. We discussed the short-term risks associated with benzodiazepines including sedation and increased fall risk among others.  Discussed long-term side effect risk including dependence, potential withdrawal symptoms, and the potential eventual dose-related risk of dementia. Discussed her use of extra Xanax for the pending myelogram that she had and how to take it.  Her restless legs is managed as long as she takes the pramipexole.  No med changes today except for the increase in paroxetine to 60 mg daily.  Follow-up 6 months  Lynder Parents, MD, DFAPA.  Future Appointments  Date Time Provider [place]  05/17/2018  2:00 PM [name], PhD [facility] None  06/07/2018  9:40 AM Belva Crome, MD [facility]    No orders of the defined types were placed in this encounter.     -------------------------------

## 2018-05-14 NOTE — Telephone Encounter (Signed)
Correction of dosing quantity of Xanax and propranolol should have been ER instead of immediate release.  Correction sent

## 2018-05-17 ENCOUNTER — Ambulatory Visit: Payer: Medicare Other | Admitting: Psychiatry

## 2018-05-23 ENCOUNTER — Telehealth: Payer: Self-pay

## 2018-05-23 NOTE — Telephone Encounter (Signed)
Patient returned our call to screen her medications and allergies prior to being scheduled for a lumbar myelogram.  She understands she will need a driver and will be here 2-2.5 hours.  She states an understanding of strict bedrest 24 hours after procedure and to hold Paxil 48 hours before, and 24 hours after, the procedure.

## 2018-05-24 ENCOUNTER — Other Ambulatory Visit: Payer: Medicare Other

## 2018-05-25 ENCOUNTER — Telehealth: Payer: Self-pay | Admitting: Interventional Cardiology

## 2018-05-25 NOTE — Telephone Encounter (Signed)
Called pt in regards to virtual visit scheduled on 6/4.  Need to change time to 8A, 8:30A or 10A, whichever is still available.  Can also be moved up to any slot on 5/28.  Please send to me to schedule.

## 2018-05-25 NOTE — Telephone Encounter (Signed)
Spoke to pt and she was agreeable to change virtual visit to 5/28.  Advised CMA will call closer to time of appt with details.  Pt appreciative for call.

## 2018-05-30 ENCOUNTER — Ambulatory Visit (INDEPENDENT_AMBULATORY_CARE_PROVIDER_SITE_OTHER): Payer: Medicare Other | Admitting: Psychiatry

## 2018-05-30 ENCOUNTER — Other Ambulatory Visit: Payer: Self-pay

## 2018-05-30 DIAGNOSIS — F3341 Major depressive disorder, recurrent, in partial remission: Secondary | ICD-10-CM

## 2018-05-30 DIAGNOSIS — F411 Generalized anxiety disorder: Secondary | ICD-10-CM | POA: Diagnosis not present

## 2018-05-30 DIAGNOSIS — F429 Obsessive-compulsive disorder, unspecified: Secondary | ICD-10-CM | POA: Diagnosis not present

## 2018-05-30 DIAGNOSIS — Z9889 Other specified postprocedural states: Secondary | ICD-10-CM

## 2018-05-30 DIAGNOSIS — F341 Dysthymic disorder: Secondary | ICD-10-CM | POA: Diagnosis not present

## 2018-05-30 DIAGNOSIS — Z630 Problems in relationship with spouse or partner: Secondary | ICD-10-CM
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
## 2018-05-30 NOTE — Progress Notes (Signed)
Virtual Visit via Video Note   This visit type was conducted due to national recommendations for restrictions regarding the COVID-19 Pandemic (e.g. social distancing) in an effort to limit this patient's exposure and mitigate transmission in our community.  Due to her co-morbid illnesses, this patient is at least at moderate risk for complications without adequate follow up.  This format is felt to be most appropriate for this patient at this time.  All issues noted in this document were discussed and addressed.  A limited physical exam was performed with this format.  Please refer to the patient's chart for her consent to telehealth for [facility].   Date:  05/31/2018   ID:  Kristin, Rich 04-22-1955, MRN [id number]  Patient Location: Home Provider Location: Home  PCP:  Shirline Frees, MD  Cardiologist:  No primary care provider on file.  Electrophysiologist:  None   Evaluation Performed:  Follow-Up Visit  Chief Complaint:  CP / Abnormal Nuclear 2016  History of Present Illness:    [name] is a 63 y.o. female with chest pain and mildly abnormal nuclear stress test.  The patient is concerned about her heart.  Both the mother and father had coronary artery disease.  She has had prior cardiac work-up.  In 2004 she had coronary angiography after an abnormal nuclear study.  No obstructive disease was identified.  In 2016 she underwent myocardial perfusion imaging and there was a small region of "inferior ischemia", however the study was felt to be low risk and no further evaluation at that time.  Over the last several months, the patient has been trying to exercise and notices particular dyspnea on exertion.  There is no specific chest discomfort with physical activity.  The dyspnea limits activity.  At other times she has 2 varieties of chest pain.  One is a sharp discomfort that occurs in the left chest and lasts minutes before resolving spontaneously.  The  other is a fluttering feeling in her chest that is momentary.  Cardiac risk factors in addition to family history are age, obesity, hyperlipidemia, and essential hypertension.  She does not smoke and is not diabetic.  She has never had a coronary calcium score.  She is not on statin therapy for risk reduction.  The patient does not have symptoms concerning for COVID-19 infection (fever, chills, cough, or new shortness of breath).    Past Medical History:  Diagnosis Date  . Anginal pain ([place])    "on and off" (11/01/2011) - anxiety related  . Anxiety    takes Paxil daily and Xanax prn  . Arthritis    "knees" (10/16/2013)  . Chronic back pain    facet disease and bulging disc; "thoracic and lower back" (10/16/2013)  . Constipation    r/t pain meds;takes stool softener daily  . Depression   . Dysrhythmia    rapid HR on occasion-takes Propranolol daily  . Family history of anesthesia complication    ":PONV; mom and sisters"  . GERD (gastroesophageal reflux disease)    takes Prilosec   . H/O hiatal hernia   . History of bladder infections    > 74yr ago  . Hyperlipidemia    takes LIpitor daily  . Hypothyroidism    takes Synthroid daily  . Insomnia   . Joint pain   . Joint swelling   . Kidney stones   . Muscle spasms of head or neck    lumbar and thoracic;takes Robaxin prn  . Peripheral  neuropathy   . PONV (postoperative nausea and vomiting)   . Rosacea   . Seasonal allergies    takes allegra prn   Past Surgical History:  Procedure Laterality Date  . ABDOMINAL HYSTERECTOMY  1980's  . ANAL FISSURE REPAIR  ~ 2011   "banded hemorrhoids at this time too"  . BARIATRIC SURGERY    . CARDIAC CATHETERIZATION  ~ 1999  . CARPAL TUNNEL RELEASE Left 09/13/2017   & ulnar nerve release  . CERVICAL DISC SURGERY  03/2017   C5, C6   . COLONOSCOPY    . ESOPHAGOGASTRODUODENOSCOPY    . JOINT REPLACEMENT     left knee  . KNEE ARTHROPLASTY Left 2007  . KNEE ARTHROSCOPY Right   . KNEE  ARTHROSCOPY Right    "torn meniscus"  . KNEE SURGERY Left 1982-2013   "17 before 10/31/2013"  . LEFT OOPHORECTOMY  1980's  . PATELLECTOMY Right   . PLANTAR FASCIA SURGERY Bilateral 1990's  . REVISION TOTAL KNEE ARTHROPLASTY Left 11/01/2011  . TOTAL KNEE ARTHROPLASTY Right 10/15/2013   Procedure: RIGHT TOTAL KNEE ARTHROPLASTY;  Surgeon: Garald Balding, MD;  Location: [facility];  Service: Orthopedics;  Laterality: Right;  . TOTAL KNEE REVISION  11/01/2011   Procedure: TOTAL KNEE REVISION;  Surgeon: Garald Balding, MD;  Location: [facility];  Service: Orthopedics;  Laterality: Left;  Revision Left Total Knee Replacement   . TUBAL LIGATION  1981     Current Meds  Medication Sig  . ALPRAZolam (XANAX) 0.5 MG tablet Take 0.5 tablets (0.25 mg total) by mouth 3 (three) times daily as needed for anxiety.  . Ascorbic Acid (VITAMIN C) 1000 MG tablet Take 1,000 mg by mouth daily.  . Cholecalciferol (VITAMIN D-3) 5000 UNITS TABS Take 5,000 Units by mouth daily.  Marland Kitchen ezetimibe (ZETIA) 10 MG tablet Take 10 mg by mouth daily.   . fexofenadine (ALLEGRA) 180 MG tablet Take 180 mg by mouth daily.  Marland Kitchen levothyroxine (SYNTHROID) 175 MCG tablet Take 175 mcg by mouth daily before breakfast.   . lidocaine (LIDODERM) 5 % Place 1 patch onto the skin every 12 (twelve) hours as needed (knee, back). Remove & Discard patch within 12 hours or as directed by MD  . methocarbamol (ROBAXIN) 750 MG tablet Take 750 mg by mouth every 6 (six) hours as needed for muscle spasms.   . Multiple Vitamin (MULTIVITAMIN) tablet Take 1 tablet by mouth daily. Reported on 04/28/2015  . PARoxetine (PAXIL) 20 MG tablet TAKE 1 TABLET BY MOUTH TWICE DAILY AND THEN 1 TABLET BY MOUTH AT BEDTIME *NEED TO SCHEDULE FOLLOW UP VISIT*  . pramipexole (MIRAPEX) 0.25 MG tablet 1 in the morning and 1 1/2 tablets at night  . propranolol ER (INDERAL LA) 60 MG 24 hr capsule Take 1 capsule (60 mg total) by mouth daily.     Allergies:   Adhesive [tape]; Morphine  and related; Nsaids; Gabapentin; Lyrica [pregabalin]; Buspar [buspirone]; Ciprofloxacin; Codeine; Lodine [etodolac]; Niacin and related; Pravastatin; Septra [sulfamethoxazole-trimethoprim]; Tylox [oxycodone-acetaminophen]; and Wellbutrin [bupropion]   Social History   Tobacco Use  . Smoking status: Never Smoker  . Smokeless tobacco: Never Used  Substance Use Topics  . Alcohol use: No  . Drug use: No     Family Hx: The patient's family history includes Heart attack in her father; Heart disease (age of onset: 29) in her father; Heart disease (age of onset: 51) in her mother; Heart failure in her mother.  ROS:   Please see the history of present  illness.    Low back discomfort makes it difficult to be fully active.  She has had bariatric surgery but has regained weight. All other systems reviewed and are negative.   Prior CV studies:   The following studies were reviewed today:  NUCLEAR STRESS TEST 2016: Study Highlights    Nuclear stress EF: 74%.  There was no ST segment deviation noted during stress.  T wave inversion was notedat rest and during stress in the V1, V2 and V3 leads.  Defect 1: There is a small defect of mild severity present in the basal inferolateral and mid inferolateral location.  Findings consistent with ischemia.  This is a low risk study.  The left ventricular ejection fraction is hyperdynamic (>65%).     Labs/Other Tests and Data Reviewed:    EKG:  No ECG reviewed.  Recent Labs: 01/25/2018: Hemoglobin 13.3; Platelets 378   Recent Lipid Panel No results found for: CHOL, TRIG, HDL, CHOLHDL, LDLCALC, LDLDIRECT  Wt Readings from Last 3 Encounters:  05/31/18 261 lb (118.4 kg)  03/06/18 255 lb (115.7 kg)  02/19/18 256 lb (116.1 kg)     Objective:    Vital Signs:  Ht 5\' 4"  (1.626 m)   Wt 261 lb (118.4 kg)   BMI 44.80 kg/m    VITAL SIGNS:  reviewed RESPIRATORY:  normal respiratory effort, symmetric expansion CARDIOVASCULAR:  no  peripheral edema MUSCULOSKELETAL:  no obvious deformities. NEURO:  alert and oriented x 3, no obvious focal deficit  ASSESSMENT & PLAN:    1. Chest pain of uncertain etiology   2. Hypercholesterolemia   3. Essential hypertension   4. 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 Morbid obesity ([place])   6. Obstructive sleep apnea syndrome   7. Educated About Covid-19 Virus Infection    PLAN:  1. The discomfort has atypical features.  The patient has a multitude of poorly controlled risk factors.  Coronary CT angios with FFR if needed to exclude the possibility that recent symptoms of chest pain and dyspnea are related to obstructive coronary disease.  This is being done in lieu of a myocardial perfusion study given abnormality noted 5 years ago and 16 years ago.  16 years ago the perfusion study was demonstrated to be a false negative by coronary angiography. 2. LDL target is less than 100 and if coronary disease, less than 70.  The coronary CT will give up to date information that will allow appropriate risk factor modification. 3. Target 130/80 mmHg.  Lifestyle changes that can improve blood pressure control were discussed in detail. 4. EKG is not performed 5. Low carbohydrate diet/Mediterranean style diet is recommended. 6. We did not discuss sleep apnea  Coronary CT angios to exclude obstructive CAD/high risk calcification.  Overall education and awareness concerning primary/secondary risk prevention was discussed in detail: LDL less than 70, hemoglobin A1c less than 7, blood pressure target less than 130/80 mmHg, >150 minutes of moderate aerobic activity per week, avoidance of smoking, weight control (via diet and exercise), and continued surveillance/management of/for obstructive sleep apnea.  Target BP: <130/80 mmHg  Diet and lifestyle measures for BP control were reviewed in detail: Low sodium diet (<2.5 gm daily); alcohol restriction (<3 ounces per  day); weight loss (Mediterranean); avoid non-steroidal agents; > 6 hours sleep per day; 150 min moderate exercise per week.     COVID-19 Education: The signs and symptoms of COVID-19 were discussed with the patient and how to seek care for  testing (follow up with PCP or arrange E-visit).  The importance of social distancing was discussed today.  Time:   Today, I have spent 20 minutes with the patient with telehealth technology discussing the above problems.     Medication Adjustments/Labs and Tests Ordered: Current medicines are reviewed at length with the patient today.  Concerns regarding medicines are outlined above.   Tests Ordered: Orders Placed This Encounter  Procedures  . CT CORONARY MORPH W/CTA COR W/SCORE W/CA W/CM &/OR WO/CM  . CT CORONARY FRACTIONAL FLOW RESERVE DATA PREP  . CT CORONARY FRACTIONAL FLOW RESERVE FLUID ANALYSIS    Medication Changes: No orders of the defined types were placed in this encounter.   Disposition:  Follow up prn  Signed, Sinclair Grooms, MD  05/31/2018 1:30 PM    [place]

## 2018-05-31 ENCOUNTER — Telehealth (INDEPENDENT_AMBULATORY_CARE_PROVIDER_SITE_OTHER): Payer: Medicare Other | Admitting: Interventional Cardiology

## 2018-05-31 ENCOUNTER — Other Ambulatory Visit: Payer: Self-pay

## 2018-05-31 ENCOUNTER — Telehealth: Payer: Self-pay

## 2018-05-31 ENCOUNTER — Encounter: Payer: Self-pay | Admitting: Interventional Cardiology

## 2018-05-31 VITALS — Ht 64.0 in | Wt 261.0 lb

## 2018-05-31 DIAGNOSIS — I10 Essential (primary) hypertension: Secondary | ICD-10-CM

## 2018-05-31 DIAGNOSIS — Z6841 Body Mass Index (BMI) 40.0 and over, adult: Secondary | ICD-10-CM

## 2018-05-31 DIAGNOSIS — R079 Chest pain, unspecified: Secondary | ICD-10-CM | POA: Diagnosis not present

## 2018-05-31 DIAGNOSIS — E7800 Pure hypercholesterolemia, unspecified: Secondary | ICD-10-CM

## 2018-05-31 DIAGNOSIS — G4733 Obstructive sleep apnea (adult) (pediatric): Secondary | ICD-10-CM

## 2018-05-31 DIAGNOSIS — I452 Bifascicular block: Secondary | ICD-10-CM

## 2018-05-31 DIAGNOSIS — Z7189 Other specified counseling: Secondary | ICD-10-CM

## 2018-05-31 NOTE — Patient Instructions (Addendum)
Medication Instructions:  Your physician recommends that you continue on your current medications as directed. Please refer to the Current Medication list given to you today.  If you need a refill on your cardiac medications before your next appointment, please call your pharmacy.   Lab work: None If you have labs (blood work) drawn today and your tests are completely normal, you will receive your results only by: Marland Kitchen MyChart Message (if you have MyChart) OR . A paper copy in the mail If you have any lab test that is abnormal or we need to change your treatment, we will call you to review the results.  Testing/Procedures: Your physician recommends that you have a Coronary CT performed.  Follow-Up: Your physician recommends that you schedule a follow-up appointment as needed with Dr. Tamala Julian.   Any Other Special Instructions Will Be Listed Below (If Applicable).  Please arrive at the [facility] main entrance of [facility] at xx:xx AM (30-45 minutes prior to test start time)  [facility] [place], [place] [address] [phone]  Proceed to the [facility] Radiology Department (First Floor).  Please follow these instructions carefully (unless otherwise directed):  Hold all erectile dysfunction medications at least 48 hours prior to test.  On the Night Before the Test: . Be sure to Drink plenty of water. . Do not consume any caffeinated/decaffeinated beverages or chocolate 12 hours prior to your test. . Do not take any antihistamines 12 hours prior to your test. . If you take Metformin do not take 24 hours prior to test.  On the Day of the Test: . Drink plenty of water. Do not drink any water within one hour of the test. . Do not eat any food 4 hours prior to the test. . You may take your regular medications prior to the test.  . HOLD Furosemide/Hydrochlorothiazide morning of the test.       After the Test: . Drink plenty of water. . After  receiving IV contrast, you may experience a mild flushed feeling. This is normal. . On occasion, you may experience a mild rash up to 24 hours after the test. This is not dangerous. If this occurs, you can take Benadryl 25 mg and increase your fluid intake. . If you experience trouble breathing, this can be serious. If it is severe call 911 IMMEDIATELY. If it is mild, please call our office. . If you take any of these medications: Glipizide/Metformin, Avandament, Glucavance, please do not take 48 hours after completing test.

## 2018-05-31 NOTE — Telephone Encounter (Signed)
YOUR CARDIOLOGY TEAM HAS ARRANGED FOR AN E-VISIT FOR YOUR APPOINTMENT - PLEASE REVIEW IMPORTANT INFORMATION BELOW SEVERAL DAYS PRIOR TO YOUR APPOINTMENT  Due to the recent COVID-19 pandemic, we are transitioning in-person office visits to tele-medicine visits in an effort to decrease unnecessary exposure to our patients, their families, and staff. These visits are billed to your insurance just like a normal visit is. We also encourage you to sign up for MyChart if you have not already done so. You will need a smartphone if possible. For patients that do not have this, we can still complete the visit using a regular telephone but do prefer a smartphone to enable video when possible. You may have a family member that lives with you that can help. If possible, we also ask that you have a blood pressure cuff and scale at home to measure your blood pressure, heart rate and weight prior to your scheduled appointment. Patients with clinical needs that need an in-person evaluation and testing will still be able to come to the office if absolutely necessary. If you have any questions, feel free to call our office.     YOUR PROVIDER WILL BE USING THE FOLLOWING PLATFORM TO COMPLETE YOUR VISIT: Doximity  . IF USING MYCHART - How to Download the MyChart App to Your SmartPhone   - If Apple, go to App Store and type in MyChart in the search bar and download the app. If Android, ask patient to go to Google Play Store and type in MyChart in the search bar and download the app. The app is free but as with any other app downloads, your phone may require you to verify saved payment information or Apple/Android password.  - You will need to then log into the app with your MyChart username and password, and select [facility] as your healthcare provider to link the account.  - When it is time for your visit, go to the MyChart app, find appointments, and click Begin Video Visit. Be sure to Select Allow for your device to  access the Microphone and Camera for your visit. You will then be connected, and your provider will be with you shortly.  **If you have any issues connecting or need assistance, please contact MyChart service desk [phone] ([phone])**  **If using a computer, in order to ensure the best quality for your visit, you will need to use either of the following Internet Browsers: Google Chrome or Microsoft Edge**  . IF USING DOXIMITY or DOXY.ME - The staff will give you instructions on receiving your link to join the meeting the day of your visit.      2-3 DAYS BEFORE YOUR APPOINTMENT  You will receive a telephone call from one of our [facility] team members - your caller ID may say "Unknown caller." If this is a video visit, we will walk you through how to get the video launched on your phone. We will remind you check your blood pressure, heart rate and weight prior to your scheduled appointment. If you have an Apple Watch or Kardia, please upload any pertinent ECG strips the day before or morning of your appointment to MyChart. Our staff will also make sure you have reviewed the consent and agree to move forward with your scheduled tele-health visit.     THE DAY OF YOUR APPOINTMENT  Approximately 15 minutes prior to your scheduled appointment, you will receive a telephone call from one of Heart[facility] team - your caller ID may say "Unknown caller."    Our staff will confirm medications, vital signs for the day and any symptoms you may be experiencing. Please have this information available prior to the time of visit start. It may also be helpful for you to have a pad of paper and pen handy for any instructions given during your visit. They will also walk you through joining the smartphone meeting if this is a video visit.    CONSENT FOR TELE-HEALTH VISIT - PLEASE REVIEW  I hereby voluntarily request, consent and authorize [facility] and its employed or contracted physicians, physician  assistants, nurse practitioners or other licensed health care professionals (the Practitioner), to provide me with telemedicine health care services (the "Services") as deemed necessary by the treating Practitioner. I acknowledge and consent to receive the Services by the Practitioner via telemedicine. I understand that the telemedicine visit will involve communicating with the Practitioner through live audiovisual communication technology and the disclosure of certain medical information by electronic transmission. I acknowledge that I have been given the opportunity to request an in-person assessment or other available alternative prior to the telemedicine visit and am voluntarily participating in the telemedicine visit.  I understand that I have the right to withhold or withdraw my consent to the use of telemedicine in the course of my care at any time, without affecting my right to future care or treatment, and that the Practitioner or I may terminate the telemedicine visit at any time. I understand that I have the right to inspect all information obtained and/or recorded in the course of the telemedicine visit and may receive copies of available information for a reasonable fee.  I understand that some of the potential risks of receiving the Services via telemedicine include:  . Delay or interruption in medical evaluation due to technological equipment failure or disruption; . Information transmitted may not be sufficient (e.g. poor resolution of images) to allow for appropriate medical decision making by the Practitioner; and/or  . In rare instances, security protocols could fail, causing a breach of personal health information.  Furthermore, I acknowledge that it is my responsibility to provide information about my medical history, conditions and care that is complete and accurate to the best of my ability. I acknowledge that Practitioner's advice, recommendations, and/or decision may be based on  factors not within their control, such as incomplete or inaccurate data provided by me or distortions of diagnostic images or specimens that may result from electronic transmissions. I understand that the practice of medicine is not an exact science and that Practitioner makes no warranties or guarantees regarding treatment outcomes. I acknowledge that I will receive a copy of this consent concurrently upon execution via email to the email address I last provided but may also request a printed copy by calling the office of [facility].    I understand that my insurance will be billed for this visit.   I have read or had this consent read to me. . I understand the contents of this consent, which adequately explains the benefits and risks of the Services being provided via telemedicine.  . I have been provided ample opportunity to ask questions regarding this consent and the Services and have had my questions answered to my satisfaction. . I give my informed consent for the services to be provided through the use of telemedicine in my medical care  By participating in this telemedicine visit I agree to the above.  

## 2018-06-04 ENCOUNTER — Other Ambulatory Visit: Payer: Self-pay | Admitting: Psychiatry

## 2018-06-06 ENCOUNTER — Other Ambulatory Visit: Payer: Self-pay | Admitting: Student

## 2018-06-06 DIAGNOSIS — M5416 Radiculopathy, lumbar region: Secondary | ICD-10-CM

## 2018-06-07 ENCOUNTER — Telehealth: Payer: Medicare Other | Admitting: Interventional Cardiology

## 2018-06-11 ENCOUNTER — Other Ambulatory Visit: Payer: Self-pay

## 2018-06-11 ENCOUNTER — Ambulatory Visit (INDEPENDENT_AMBULATORY_CARE_PROVIDER_SITE_OTHER): Payer: Medicare Other | Admitting: Psychiatry

## 2018-06-11 DIAGNOSIS — Z630 Problems in relationship with spouse or partner: Secondary | ICD-10-CM

## 2018-06-11 DIAGNOSIS — F341 Dysthymic disorder: Secondary | ICD-10-CM | POA: Diagnosis not present

## 2018-06-11 DIAGNOSIS — F411 Generalized anxiety disorder: Secondary | ICD-10-CM

## 2018-06-11 DIAGNOSIS — F429 Obsessive-compulsive disorder, unspecified: Secondary | ICD-10-CM | POA: Diagnosis not present

## 2018-06-11 DIAGNOSIS — F3341 Major depressive disorder, recurrent, in partial remission: Secondary | ICD-10-CM
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
## 2018-06-13 ENCOUNTER — Ambulatory Visit
Admission: RE | Admit: 2018-06-13 | Discharge: 2018-06-13 | Disposition: A | Discharge status: Home / Self Care | Payer: Medicare Other | Source: Ambulatory Visit | Attending: Student | Admitting: Student

## 2018-06-13 ENCOUNTER — Other Ambulatory Visit: Payer: Self-pay | Admitting: Student

## 2018-06-13 ENCOUNTER — Other Ambulatory Visit: Payer: Self-pay

## 2018-06-13 ENCOUNTER — Telehealth: Payer: Self-pay | Admitting: *Deleted

## 2018-06-13 DIAGNOSIS — M5416 Radiculopathy, lumbar region: Secondary | ICD-10-CM

## 2018-06-13 MED ORDER — ONDANSETRON HCL 4 MG/2ML IJ SOLN
4.0000 mg | Freq: Once | INTRAMUSCULAR | Status: AC
  Administered 2018-06-13: 4 mg via INTRAMUSCULAR

## 2018-06-13 MED ORDER — IOPAMIDOL (ISOVUE-M 300) INJECTION 61%
10.0000 mL | Freq: Once | INTRAMUSCULAR | Status: AC | PRN
  Administered 2018-06-13: 10 mL via INTRATHECAL

## 2018-06-13 MED ORDER — MEPERIDINE HCL 50 MG/ML IJ SOLN
50.0000 mg | Freq: Once | INTRAMUSCULAR | Status: AC
  Administered 2018-06-13: 50 mg via INTRAMUSCULAR

## 2018-06-13 MED ORDER — DIAZEPAM 5 MG PO TABS
10.0000 mg | ORAL_TABLET | Freq: Once | ORAL | Status: AC
  Administered 2018-06-13: 10 mg via ORAL

## 2018-06-13 NOTE — Discharge Instructions (Signed)

## 2018-06-13 NOTE — Telephone Encounter (Signed)
    COVID-19 Pre-Screening Questions:  . In the past 7 to 10 days have you had a cough,  shortness of breath, headache, congestion, fever (100 or greater) body aches, chills, sore throat, or sudden loss of taste or sense of smell? . Have you been around anyone with known Covid 19. . Have you been around anyone who is awaiting Covid 19 test results in the past 7 to 10 days? . Have you been around anyone who has been exposed to Covid 19, or has mentioned symptoms of Covid 19 within the past 7 to 10 days?  If you have any concerns/questions about symptoms patients report during screening (either on the phone or at threshold). Contact the provider seeing the patient or DOD for further guidance.  If neither are available contact a member of the leadership team.           Contacted patient via phone call and got answering machine. Awaiting teturn call. KB

## 2018-06-13 NOTE — Telephone Encounter (Signed)
New Messgae    Patient returning your call.

## 2018-06-13 NOTE — Progress Notes (Signed)
Pt reports she has been off of her Paxil for at least 48 hrs for her myelogram procedure today.

## 2018-06-15 ENCOUNTER — Other Ambulatory Visit: Payer: Self-pay

## 2018-06-15 ENCOUNTER — Other Ambulatory Visit: Payer: Medicare Other | Admitting: *Deleted

## 2018-06-15 DIAGNOSIS — R079 Chest pain, unspecified: Secondary | ICD-10-CM

## 2018-06-15 NOTE — Progress Notes (Signed)
Patient here for a BMET for Cardiac CT. Order placed.

## 2018-06-16 LAB — BASIC METABOLIC PANEL
BUN/Creatinine Ratio: 15 (ref 12–28)
BUN: 13 mg/dL (ref 8–27)
CO2: 25 mmol/L (ref 20–29)
Calcium: 9.4 mg/dL (ref 8.7–10.3)
Chloride: 103 mmol/L (ref 96–106)
Creatinine, Ser: 0.85 mg/dL (ref 0.57–1.00)
GFR calc Af Amer: 84 mL/min/{1.73_m2} (ref >59)
GFR calc non Af Amer: 73 mL/min/{1.73_m2} (ref >59)
Glucose: 91 mg/dL (ref 65–99)
Potassium: 4.2 mmol/L (ref 3.5–5.2)
Sodium: 141 mmol/L (ref 134–144)

## 2018-06-18 ENCOUNTER — Telehealth (HOSPITAL_COMMUNITY): Payer: Self-pay | Admitting: Emergency Medicine

## 2018-06-18 NOTE — Telephone Encounter (Signed)
Reaching out to patient to offer assistance regarding upcoming cardiac imaging study; pt verbalizes understanding of appt date/time, parking situation and where to check in, pre-test NPO status and medications ordered, and verified current allergies; name and call back number provided for further questions should they arise Kristin Feltes RN Navigator Cardiac Imaging [facility] [phone] office [phone] cell  Pt denies covid symptoms, verbalized understanding of visitor policy. 

## 2018-06-19 ENCOUNTER — Ambulatory Visit (HOSPITAL_COMMUNITY): Admission: RE | Admit: 2018-06-19 | Payer: Medicare Other | Source: Ambulatory Visit

## 2018-06-19 ENCOUNTER — Other Ambulatory Visit: Payer: Self-pay

## 2018-06-19 ENCOUNTER — Encounter: Payer: Medicare Other | Admitting: *Deleted

## 2018-06-19 ENCOUNTER — Ambulatory Visit (HOSPITAL_COMMUNITY)
Admission: RE | Admit: 2018-06-19 | Discharge: 2018-06-19 | Disposition: A | Discharge status: Home / Self Care | Payer: Medicare Other | Source: Ambulatory Visit | Attending: Interventional Cardiology | Admitting: Interventional Cardiology

## 2018-06-19 DIAGNOSIS — I2510 Atherosclerotic heart disease of native coronary artery without angina pectoris: Secondary | ICD-10-CM | POA: Diagnosis not present

## 2018-06-19 DIAGNOSIS — R0789 Other chest pain: Secondary | ICD-10-CM | POA: Insufficient documentation

## 2018-06-19 DIAGNOSIS — R079 Chest pain, unspecified: Secondary | ICD-10-CM

## 2018-06-19 DIAGNOSIS — Z006 Encounter for examination for normal comparison and control in clinical research program: Secondary | ICD-10-CM

## 2018-06-19 MED ORDER — METOPROLOL TARTRATE 5 MG/5ML IV SOLN
5.0000 mg | INTRAVENOUS | Status: DC | PRN
  Filled 2018-06-19: qty 5

## 2018-06-19 MED ORDER — NITROGLYCERIN 0.4 MG SL SUBL
0.8000 mg | SUBLINGUAL_TABLET | SUBLINGUAL | Status: DC | PRN
  Administered 2018-06-19: 0.8 mg via SUBLINGUAL
  Filled 2018-06-19: qty 25

## 2018-06-19 MED ORDER — NITROGLYCERIN 0.4 MG SL SUBL
SUBLINGUAL_TABLET | SUBLINGUAL | Status: AC
  Filled 2018-06-19: qty 2

## 2018-06-19 MED ORDER — IOHEXOL 350 MG/ML SOLN
100.0000 mL | Freq: Once | INTRAVENOUS | Status: AC | PRN
  Administered 2018-06-19: 100 mL via INTRAVENOUS

## 2018-06-19 NOTE — Research (Signed)
CADFEM Informed Consent   Subject Name: [name]  Subject met inclusion and exclusion criteria.  The informed consent form, study requirements and expectations were reviewed with the subject and questions and concerns were addressed prior to the signing of the consent form.  The subject verbalized understanding of the trail requirements.  The subject agreed to participate in the CADFEM trial and signed the informed consent.  The informed consent was obtained prior to performance of any protocol-specific procedures for the subject.  A copy of the signed informed consent was given to the subject and a copy was placed in the subject's medical record.

## 2018-06-20 ENCOUNTER — Telehealth: Payer: Self-pay | Admitting: Interventional Cardiology

## 2018-06-20 NOTE — Telephone Encounter (Signed)
Patient states that her home phone number is out due to rain. Please call her cell phone to go over results from her Cardiac CT Scan done 06/16

## 2018-06-20 NOTE — Telephone Encounter (Signed)
Attempted to contact pt.  VM picked up but is full.  Unable to leave message.

## 2018-06-21 NOTE — Telephone Encounter (Signed)
Follow up   Patient is returning call for test CT results. Please call.

## 2018-06-21 NOTE — Telephone Encounter (Signed)
Attempted to speak with the patient, voicemail box full.

## 2018-06-25 ENCOUNTER — Ambulatory Visit: Payer: Medicare Other | Admitting: Psychiatry

## 2018-06-25 NOTE — Telephone Encounter (Signed)
Informed pt of results. Pt verbalized understanding. 

## 2018-07-02 ENCOUNTER — Other Ambulatory Visit: Payer: Self-pay

## 2018-07-02 ENCOUNTER — Ambulatory Visit (INDEPENDENT_AMBULATORY_CARE_PROVIDER_SITE_OTHER): Payer: Medicare Other | Admitting: Psychiatry

## 2018-07-02 DIAGNOSIS — F429 Obsessive-compulsive disorder, unspecified: Secondary | ICD-10-CM

## 2018-07-02 DIAGNOSIS — F3341 Major depressive disorder, recurrent, in partial remission: Secondary | ICD-10-CM | POA: Diagnosis not present

## 2018-07-02 DIAGNOSIS — Z630 Problems in relationship with spouse or partner: Secondary | ICD-10-CM | POA: Diagnosis not present

## 2018-07-02 DIAGNOSIS — Z9889 Other specified postprocedural states: Secondary | ICD-10-CM

## 2018-07-02 DIAGNOSIS — F411 Generalized anxiety disorder: Secondary | ICD-10-CM | POA: Diagnosis not present
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
## 2018-07-20 ENCOUNTER — Ambulatory Visit (INDEPENDENT_AMBULATORY_CARE_PROVIDER_SITE_OTHER): Payer: Medicare Other | Admitting: Psychiatry

## 2018-07-20 DIAGNOSIS — F429 Obsessive-compulsive disorder, unspecified: Secondary | ICD-10-CM

## 2018-07-20 DIAGNOSIS — M546 Pain in thoracic spine: Secondary | ICD-10-CM

## 2018-07-20 DIAGNOSIS — F411 Generalized anxiety disorder: Secondary | ICD-10-CM

## 2018-07-20 DIAGNOSIS — F5089 Other specified eating disorder: Secondary | ICD-10-CM

## 2018-07-20 DIAGNOSIS — F3341 Major depressive disorder, recurrent, in partial remission: Secondary | ICD-10-CM | POA: Diagnosis not present

## 2018-07-20 DIAGNOSIS — E079 Disorder of thyroid, unspecified: Secondary | ICD-10-CM

## 2018-07-20 DIAGNOSIS — Z9889 Other specified postprocedural states: Secondary | ICD-10-CM

## 2018-07-20 DIAGNOSIS — Z630 Problems in relationship with spouse or partner: Secondary | ICD-10-CM | POA: Diagnosis not present
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
## 2018-08-14 ENCOUNTER — Ambulatory Visit (INDEPENDENT_AMBULATORY_CARE_PROVIDER_SITE_OTHER): Payer: Medicare Other | Admitting: Psychiatry

## 2018-08-14 DIAGNOSIS — F429 Obsessive-compulsive disorder, unspecified: Secondary | ICD-10-CM

## 2018-08-14 DIAGNOSIS — F341 Dysthymic disorder: Secondary | ICD-10-CM | POA: Diagnosis not present

## 2018-08-14 DIAGNOSIS — F4010 Social phobia, unspecified: Secondary | ICD-10-CM | POA: Diagnosis not present

## 2018-08-14 DIAGNOSIS — F411 Generalized anxiety disorder: Secondary | ICD-10-CM | POA: Diagnosis not present

## 2018-08-14 NOTE — Progress Notes (Signed)
Psychotherapy Progress Note [facility] [name], PhD LP  Patient ID: [name]     MRN: [id number]     Therapy format: Individual psychotherapy Date: 08/14/2018     Start: 2:14p Stop: 3:00p Time Spent: 46 min Location: telehealth   Telehealth visit -- I connected with this patient by an approved telecommunication method (video), with her informed consent, and verifying identity and patient privacy.  I was located at my office and patient at her home.  As needed, we discussed the limitations, risks, and security and privacy concerns associated with telehealth service, including the availability and conditions which currently govern in-person appointments and the possibility that 3rd-party payment may not be fully guaranteed and she may be responsible for charges.  After she indicated understanding, we proceeded with the session.  Also discussed treatment planning, as needed, including ongoing verbal agreement with the plan, the opportunity to ask and answer all questions, her demonstrated understanding of instructions, and her readiness to call the office should symptoms worsen or she feels she is in a crisis state and needs more immediate and tangible assistance.  Session narrative (presenting needs, interim history, self-report of stressors and symptoms, applications of prior therapy, status changes, and interventions made in session) Doing well enough with catastrophizing thoughts, like breakins or superstitious fears of harm.  Has used "Stop", aloud, to handle these, but the scrupulosity thoughts will not respond.  Triggered by brother's (a preacher) testimony in a sermon she listened to.  History of husband reporting her father told him privately before marriage that he had reservations about whether her salvation is secure, and saying it was because she didn't lose her mischievous sense of humor, and because of her named surrender experience.  Illuminated the  illogic of it, making a point on which she could discredit her father's (almighty) influence, better begin to strategize against compulsions, and trust a kinder, more loving God than she was shown growing up.    Refreshed breathing skills for calming anxiety and conducted visualization of God, kinder than that, showing up as Jesus asking her to come just as she is, nothing to prove.   Used beloved hymn, "Just As I Am" as internally perceived soundtrack for coming into presence.  Allowed to bask a bit and shed any pretense of having to prove her worth.  Therapeutic modalities: Cognitive Behavioral Therapy, Solution-Oriented/Positive Psychology and faith-sensitive  Mental Status/Observations:  Appearance:   Casual     Behavior:  Appropriate  Motor:  Normal  Speech/Language:   Clear and Coherent  Affect:  Appropriate  Mood:  normal  Thought process:  normal  Thought content:    Obsessions  Sensory/Perceptual disturbances:    WNL  Orientation:  grossly intact  Attention:  Fair  Concentration:  Good  Memory:  WNL  Insight:    Fair  Judgment:   Fair  Impulse Control:  Good   Risk Assessment: Danger to Self: No Self-injurious Behavior: No Danger to Others: No Physical Aggression / Violence: No Duty to Warn: No Access to Firearms a concern: No  Assessment of progress:  stabilized  Diagnosis:   ICD-10-CM   1. Obsessive-compulsive disorder, unspecified type  F42.9   2. Generalized anxiety disorder  F41.1   3. Social anxiety disorder  F40.10   4. 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 Continue conscious, aloud if desired, thought-stopping . Review "Just As I Am " lyrics if able, apply to his life. . Other  recommendations/advice as noted above . Continue to utilize previously learned skills ad lib . Maintain medication as prescribed and work faithfully with relevant prescriber(s) if any changes are desired or seem indicated . Call the clinic on-call service, present to ER, or call  911 if any life-threatening psychiatric crisis Return in about 3 weeks (around 09/04/2018) for time as available.  [name], PhD [name], PhD LP Clinical Psychologist, [facility] [address] [place], [place] [address] [phone]

## 2018-09-18 ENCOUNTER — Other Ambulatory Visit: Payer: Self-pay

## 2018-09-18 ENCOUNTER — Ambulatory Visit (INDEPENDENT_AMBULATORY_CARE_PROVIDER_SITE_OTHER): Payer: Medicare Other | Admitting: Psychiatry

## 2018-09-18 DIAGNOSIS — F411 Generalized anxiety disorder: Secondary | ICD-10-CM | POA: Diagnosis not present

## 2018-09-18 DIAGNOSIS — F429 Obsessive-compulsive disorder, unspecified: Secondary | ICD-10-CM | POA: Diagnosis not present

## 2018-09-18 DIAGNOSIS — F4010 Social phobia, unspecified: Secondary | ICD-10-CM | POA: Diagnosis not present

## 2018-09-18 DIAGNOSIS — F3340 Major depressive disorder, recurrent, in remission, unspecified: Secondary | ICD-10-CM

## 2018-09-18 NOTE — Progress Notes (Signed)
Psychotherapy Progress Note [facility] Kristin Moore, PhD LP  Patient ID: [name]     MRN: [id number]     Therapy format: Individual psychotherapy Date: 09/18/2018     Start: 2:15p Stop: 3:05p Time Spent: 50 min Location: telehealth   Telehealth visit -- I connected with this patient by an approved telecommunication method (video), with her informed consent, and verifying identity and patient privacy.  I was located at my office and patient at her home.  As needed, we discussed the limitations, risks, and security and privacy concerns associated with telehealth service, including the availability and conditions which currently govern in-person appointments and the possibility that 3rd-party payment may not be fully guaranteed and she may be responsible for charges.  After she indicated understanding, we proceeded with the session.  Also discussed treatment planning, as needed, including ongoing verbal agreement with the plan, the opportunity to ask and answer all questions, her demonstrated understanding of instructions, and her readiness to call the office should symptoms worsen or she feels she is in a crisis state and needs more immediate and tangible assistance.  Session narrative (presenting needs, interim history, self-report of stressors and symptoms, applications of prior therapy, status changes, and interventions made in session) Reports having extended the visualization from last session (outreach to [place], [employer] "Just As I Am" as a mental soundtrack), some crying, some anxiety attributed to feeling emotionally disoriented by breakthrough of having to obey intrusive, anxious thoughts, which have dominated her psychologically back to young childhood.  Finds it unsettling, actually, to be free not to have to get her direction from formalisms.  Normalized and supported in getting used to emotional and intellectual freedom.    As Pt spoke on, clearly still  worrying that she might be missing something morally crucial, feeling OCD pressure to rekindle formalisms and compulsive prayer.  Granted permission to do or not do, assured God is listening.  With consent, modeled grace-based response to her worries as from God, arguing down conditional self-justifications and reframing her connection and salvation, as she understands it, as pure and free gift from a loving God as professed but has found very elusive to experience or trust.  Nevertheless, moved to tears in places and urgently embarrassed, cautioning TX not to write it down that she cried in session.  Continued radical acceptance and indicated divine approval and acceptance, too, reframing her emotionality as signs of empathy and true caring, nothing defective or spiritually pathological.  Confronted self-demands to know the Bible thoroughly in order to be worthy, and to "perform" as a listener and Darrick Meigs with a friend.  Strongly reframed her obvious, helpful empathy as the main thing, and enough, in supporting her friend.  Insight today that she learned to "have to" prove herself before she was ever near self-aware enough to question it.  As practice in coping with good news on herself and weathering the paradoxical anxiety of overthrowing rule/law-based justification and the primacy of OCD and its practices, proposed a prayer form centering on "Help me get used to grace", "Help me, [name], get used to the fact that you love me already", and "[I have] Things to do, nothing to prove".  Agrees.  Neurosurgeon on Friday re. Back.  Therapeutic modalities: Cognitive Behavioral Therapy, Solution-Oriented/Positive Psychology and faith-sensitive  Mental Status/Observations:  Appearance:   Casual     Behavior:  Appropriate and uneasy in places  Motor:  Normal  Speech/Language:   Clear and Coherent  Affect:  Appropriate  Mood:  normal  Thought process:  normal  Thought content:    Spiritual obsessions   Sensory/Perceptual disturbances:    WNL  Orientation:  grossly intact  Attention:  Good  Concentration:  Good  Memory:  WNL  Insight:    Fair  Judgment:   Good  Impulse Control:  Good   Risk Assessment: Danger to Self: No Self-injurious Behavior: No Danger to Others: No Physical Aggression / Violence: No Duty to Warn: No Access to Firearms a concern: No  Assessment of progress:  progressing  Diagnosis:   ICD-10-CM   1. Obsessive-compulsive disorder, unspecified type  F42.9   2. Generalized anxiety disorder  F41.1   3. Social anxiety disorder  F40.10   4. Recurrent major depressive disorder, in remission ([place])  F33.40     Plan:  . Practice accepting grace, use hymn and visualizations to let it be possible, then likely that god loves her as she is, wants her to grow but needs her to prove nothing  . Other recommendations/advice as noted above . Continue to utilize previously learned skills ad lib . Maintain medication as prescribed and work faithfully with relevant prescriber(s) if any changes are desired or seem indicated . Call the clinic on-call service, present to ER, or call 911 if any life-threatening psychiatric crisis Return in about 1 month (around 10/18/2018) for time as available.  [name], PhD Kristin Moore, PhD LP Clinical Psychologist, [facility] [address], [place], [place] [address] [phone]

## 2018-09-26 ENCOUNTER — Other Ambulatory Visit: Payer: Self-pay | Admitting: Neurosurgery

## 2018-10-02 ENCOUNTER — Ambulatory Visit (INDEPENDENT_AMBULATORY_CARE_PROVIDER_SITE_OTHER): Payer: Medicare Other | Admitting: Orthopaedic Surgery

## 2018-10-02 ENCOUNTER — Ambulatory Visit: Payer: Self-pay

## 2018-10-02 ENCOUNTER — Encounter: Payer: Self-pay | Admitting: Orthopaedic Surgery

## 2018-10-02 ENCOUNTER — Other Ambulatory Visit: Payer: Self-pay

## 2018-10-02 VITALS — BP 143/81 | HR 73 | Ht 64.0 in | Wt 265.0 lb

## 2018-10-02 DIAGNOSIS — M25562 Pain in left knee: Secondary | ICD-10-CM

## 2018-10-02 DIAGNOSIS — G8929 Other chronic pain: Secondary | ICD-10-CM

## 2018-10-02 NOTE — Progress Notes (Signed)
Office Visit Note   Patient: [name]           Date of Birth: 05-Mar-1955           MRN: [id number] Visit Date: 10/02/2018              Requested by: Shirline Frees, MD [place],  [place] [id number] PCP: Shirline Frees, MD   Assessment & Plan: Visit Diagnoses:  1. Chronic pain of left knee     Plan: I do not see an obvious cause of pain related to her left total knee revision.  The knee was not hot warm or red.  No evidence of active pseudogout.  Very minimal opening with varus and valgus stress but patient is not experiencing instability.  I think a lot of this may be ascribed to her back.  She is going to have a back fusion L4-5 and L5-S1 in the next several weeks. Being followed in a pain clinic and has been advised not to have any more cortisone as she does have elevated cortisol levels.  Also has difficulty with NSAIDs.  May continue with CBD oil.  We will continue with weight loss and exercises and plan to see her back after her back surgery.  No specific treatment for her knee at this point.  Reassured her that I did not think there was anything significant problem with her knee  Follow-Up Instructions: No follow-ups on file.   Orders:  Orders Placed This Encounter  Procedures  . XR KNEE 3 VIEW LEFT   No orders of the defined types were placed in this encounter.     Procedures: No procedures performed   Clinical Data: No additional findings.   Subjective: Chief Complaint  Patient presents with  . Left Knee - Follow-up  Patient presents today for recurrent left knee pain. SHe had a left knee cortisone injection 6 months ago.  Patient states that the injection did not help. She is getting ready to have back surgery in a couple weeks. She has pain that goes down her leg from her knee. She is taking tylenol as needed, and Dilaudid for her back pain.  Prior left patellectomy me years ago before her total knee revision.  6 months  ago had a bone scan revealing some uptake along the lateral tibial plateau of her left knee.  She has had an injection both over that area and intra-articularly without much relief.  She is having some pain along the anterior aspect of her knee.  Has a history of a neuroma medially with positive Tinel's.  No history of fever or chills.  No feeling of her knee being unstable.  This particular pain is been present for a month or 2.  No history of injury or trauma.  HPI  Review of Systems  Constitutional: Negative for fatigue.  Eyes: Negative for pain.  Respiratory: Negative for shortness of breath.   Cardiovascular: Negative for leg swelling.  Gastrointestinal: Negative for constipation and diarrhea.  Endocrine: Negative for cold intolerance and heat intolerance.  Genitourinary: Negative for difficulty urinating.  Musculoskeletal: Positive for joint swelling.  Skin: Negative for rash.  Allergic/Immunologic: Negative for food allergies.  Neurological: Positive for weakness.  Hematological: Does not bruise/bleed easily.  Psychiatric/Behavioral: Positive for sleep disturbance.     Objective: Vital Signs: BP (!) 143/81   Pulse 73   Ht 5\' 4"  (1.626 m)   Wt 265 lb (120.2 kg)   BMI 45.49  kg/m   Physical Exam Constitutional:      Appearance: She is well-developed.  Eyes:     Pupils: Pupils are equal, round, and reactive to light.  Pulmonary:     Effort: Pulmonary effort is normal.  Skin:    General: Skin is warm and dry.  Neurological:     Mental Status: She is alert and oriented to person, place, and time.  Psychiatric:        Behavior: Behavior normal.     Ortho Exam left knee was not hot red warm or swollen.  Multiple incisions of healed nicely.  Good alignment.  Opens a little medially and laterally with a varus and valgus stress.  Negative anterior drawer sign.  No pain about the extensor mechanism.  Does have some positive Tinel's along the medial femur from an old neuroma.   Some tender pain along the anterior lateral tibia but no redness or skin changes.  X-rays were negative neurologically intact.  Straight leg raise negative  Specialty Comments:  No specialty comments available.  Imaging: No results found.   PMFS History: Patient Active Problem List   Diagnosis Date Noted  . 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 Obsessive-compulsive disorder 10/10/2017  . Generalized anxiety disorder 10/10/2017  . Major depressive disorder, recurrent episode, moderate ([place]) 10/10/2017  . Hiatal hernia 03/03/2015  . Steatosis of liver 09/16/2014  . Obstructive sleep apnea syndrome 08/27/2014  . Pre-op evaluation 05/14/2014  . Osteoarthritis of left knee 10/17/2013  . History of left knee replacement 10/15/2013  . Hypercholesterolemia 08/05/2013  . Palpitations 08/05/2013  . HTN (hypertension) 08/05/2013  . 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 Preoperative cardiovascular examination 08/05/2013  . Degenerative arthritis of knee, bilateral 08/05/2013  . Chronic pain of left knee 11/03/2011  . Morbid obesity ([place]) 11/03/2011  . Hypothyroid 11/03/2011   Past Medical History:  Diagnosis Date  . Anginal pain ([place])    "on and off" (11/01/2011) - anxiety related  . Anxiety    takes Paxil daily and Xanax prn  . Arthritis    "knees" (10/16/2013)  . Chronic back pain    facet disease and bulging disc; "thoracic and lower back" (10/16/2013)  . Constipation    r/t pain meds;takes stool softener daily  . Depression   . Dysrhythmia    rapid HR on occasion-takes Propranolol daily  . Family history of anesthesia complication    ":PONV; mom and sisters"  . GERD (gastroesophageal reflux disease)    takes Prilosec   . H/O hiatal hernia   . History of bladder infections    > 90yr ago  . Hyperlipidemia    takes LIpitor daily  . Hypothyroidism    takes Synthroid daily  . Insomnia   . Joint pain   . Joint swelling   .  Kidney stones   . Muscle spasms of head or neck    lumbar and thoracic;takes Robaxin prn  . Peripheral neuropathy   . PONV (postoperative nausea and vomiting)   . Rosacea   . Seasonal allergies    takes allegra prn    Family History  Problem Relation Age of Onset  . Heart disease Mother 53  . Heart failure Mother   . Heart attack Father   . Heart disease Father 54    Past Surgical History:  Procedure Laterality Date  . ABDOMINAL HYSTERECTOMY  1980's  . ANAL FISSURE REPAIR  ~ 2011   "banded hemorrhoids at this  time too"  . BARIATRIC SURGERY    . CARDIAC CATHETERIZATION  ~ 1999  . CARPAL TUNNEL RELEASE Left 09/13/2017   & ulnar nerve release  . CERVICAL DISC SURGERY  03/2017   C5, C6   . COLONOSCOPY    . ESOPHAGOGASTRODUODENOSCOPY    . JOINT REPLACEMENT     left knee  . KNEE ARTHROPLASTY Left 2007  . KNEE ARTHROSCOPY Right   . KNEE ARTHROSCOPY Right    "torn meniscus"  . KNEE SURGERY Left 1982-2013   "17 before 10/31/2013"  . LEFT OOPHORECTOMY  1980's  . PATELLECTOMY Right   . PLANTAR FASCIA SURGERY Bilateral 1990's  . REVISION TOTAL KNEE ARTHROPLASTY Left 11/01/2011  . TOTAL KNEE ARTHROPLASTY Right 10/15/2013   Procedure: RIGHT TOTAL KNEE ARTHROPLASTY;  Surgeon: Garald Balding, MD;  Location: [facility];  Service: Orthopedics;  Laterality: Right;  . TOTAL KNEE REVISION  11/01/2011   Procedure: TOTAL KNEE REVISION;  Surgeon: Garald Balding, MD;  Location: [facility];  Service: Orthopedics;  Laterality: Left;  Revision Left Total Knee Replacement   . TUBAL LIGATION  1981   Social History   Occupational History  . Not on file  Tobacco Use  . Smoking status: Never Smoker  . Smokeless tobacco: Never Used  Substance and Sexual Activity  . Alcohol use: No  . Drug use: No  . Sexual activity: Yes    Birth control/protection: Surgical

## 2018-10-10 ENCOUNTER — Encounter (HOSPITAL_COMMUNITY): Payer: Self-pay

## 2018-10-10 NOTE — Progress Notes (Signed)
[place], [place] - [address], [id number], [place] [address] Phone: [phone] Fax: [phone]  [place], [place] [address] Phone: [phone] Fax: [phone]      Your procedure is scheduled on Monday, October 12th.  Report to [facility] Main Entrance "A" at 5:30 A.M., and check in at the Admitting office.  Call this number if you have problems the morning of surgery:  [phone]  Call [phone] if you have any questions prior to your surgery date Monday-Friday 8am-4pm    Remember:  Do not eat or drink after midnight the night before your surgery     Take these medicines the morning of surgery with A SIP OF WATER   Tylenol - if needed  Xanax  Ezetimibe (Zetia)  Allegra - if needed  flonase nasal spray - if needed  Hydromorphone (Dilaudid)  Oxycodone  Pramipexole (Mirapex)  Propanolol   Synthroid  7 days prior to surgery STOP taking any Aspirin (unless otherwise instructed by your surgeon), Aleve, Naproxen, Ibuprofen, Motrin, Advil, Goody's, BC's, all herbal medications, fish oil, and all vitamins.    The Morning of Surgery  Do not wear jewelry, make-up or nail polish.  Do not wear lotions, powders, or perfumes, or deodorant  Do not shave 48 hours prior to surgery.    Do not bring valuables to the hospital.  [facility] is not responsible for any belongings or valuables.  If you are a smoker, DO NOT Smoke 24 hours prior to surgery IF you wear a CPAP at night please bring your mask, tubing, and machine the morning of surgery   Remember that you must have someone to transport you home after your surgery, and remain with you for 24 hours if you are discharged the same day.   Contacts, glasses, hearing aids, dentures or bridgework may not be worn into surgery.    Leave your suitcase in the car.  After surgery it may be  brought to your room.  For patients admitted to the hospital, discharge time will be determined by your treatment team.  Patients discharged the day of surgery will not be allowed to drive home.    Special instructions:   [facility]- Preparing For Surgery  Before surgery, you can play an important role. Because skin is not sterile, your skin needs to be as free of germs as possible. You can reduce the number of germs on your skin by washing with CHG (chlorahexidine gluconate) Soap before surgery.  CHG is an antiseptic cleaner which kills germs and bonds with the skin to continue killing germs even after washing.    Oral Hygiene is also important to reduce your risk of infection.  Remember - BRUSH YOUR TEETH THE MORNING OF SURGERY WITH YOUR REGULAR TOOTHPASTE  Please do not use if you have an allergy to CHG or antibacterial soaps. If your skin becomes reddened/irritated stop using the CHG.  Do not shave (including legs and underarms) for at least 48 hours prior to first CHG shower. It is OK to shave your face.  Please follow these instructions carefully.   1. Shower the NIGHT BEFORE SURGERY and the MORNING OF SURGERY with CHG Soap.   2. If you chose to wash your hair, wash your hair first as usual with your normal shampoo.  3. After you shampoo, rinse your hair and body thoroughly to remove the shampoo.  4. Use CHG as you would any other liquid soap. You can apply CHG directly to the skin and wash gently with a scrungie or a clean washcloth.   5. Apply the CHG Soap to your body ONLY FROM THE NECK DOWN.  Do not use on open wounds or open sores. Avoid contact with your eyes, ears, mouth and genitals (private parts). Wash Face and genitals (private parts)  with your normal soap.   6. Wash thoroughly, paying special attention to the area where your surgery will be performed.  7. Thoroughly rinse your body with warm water from the neck down.  8. DO NOT shower/wash with your normal soap  after using and rinsing off the CHG Soap.  9. Pat yourself dry with a CLEAN TOWEL.  10. Wear CLEAN PAJAMAS to bed the night before surgery, wear comfortable clothes the morning of surgery  11. Place CLEAN SHEETS on your bed the night of your first shower and DO NOT SLEEP WITH PETS.    Day of Surgery:  Do not apply any deodorants/lotions. Please shower the morning of surgery with the CHG soap  Please wear clean clothes to the hospital/surgery center.   Remember to brush your teeth WITH YOUR REGULAR TOOTHPASTE.   Please read over the following fact sheets that you were given.

## 2018-10-11 ENCOUNTER — Other Ambulatory Visit (HOSPITAL_COMMUNITY)
Admission: RE | Admit: 2018-10-11 | Discharge: 2018-10-11 | Disposition: A | Discharge status: Home / Self Care | Payer: Medicare Other | Source: Ambulatory Visit | Attending: Neurosurgery | Admitting: Neurosurgery

## 2018-10-11 ENCOUNTER — Encounter (HOSPITAL_COMMUNITY)
Admission: RE | Admit: 2018-10-11 | Discharge: 2018-10-11 | Disposition: A | Discharge status: Home / Self Care | Payer: Medicare Other | Source: Ambulatory Visit | Attending: Neurosurgery | Admitting: Neurosurgery

## 2018-10-11 ENCOUNTER — Other Ambulatory Visit: Payer: Self-pay

## 2018-10-11 ENCOUNTER — Encounter (HOSPITAL_COMMUNITY): Payer: Self-pay

## 2018-10-11 DIAGNOSIS — Z01818 Encounter for other preprocedural examination: Secondary | ICD-10-CM | POA: Diagnosis not present

## 2018-10-11 DIAGNOSIS — I4510 Unspecified right bundle-branch block: Secondary | ICD-10-CM | POA: Insufficient documentation

## 2018-10-11 DIAGNOSIS — M4316 Spondylolisthesis, lumbar region: Secondary | ICD-10-CM | POA: Diagnosis not present

## 2018-10-11 DIAGNOSIS — Z20828 Contact with and (suspected) exposure to other viral communicable diseases: Secondary | ICD-10-CM | POA: Diagnosis not present

## 2018-10-11 DIAGNOSIS — I10 Essential (primary) hypertension: Secondary | ICD-10-CM | POA: Diagnosis not present

## 2018-10-11 HISTORY — DX: Sleep apnea, unspecified: G47.30

## 2018-10-11 LAB — CBC
HCT: 39.3 % (ref 36.0–46.0)
Hemoglobin: 12.6 g/dL (ref 12.0–15.0)
MCH: 31.3 pg (ref 26.0–34.0)
MCHC: 32.1 g/dL (ref 30.0–36.0)
MCV: 97.8 fL (ref 80.0–100.0)
Platelets: 293 10*3/uL (ref 150–400)
RBC: 4.02 MIL/uL (ref 3.87–5.11)
RDW: 13.2 % (ref 11.5–15.5)
WBC: 5.8 10*3/uL (ref 4.0–10.5)
nRBC: 0 % (ref 0.0–0.2)

## 2018-10-11 LAB — BASIC METABOLIC PANEL
Anion gap: 8 (ref 5–15)
BUN: 16 mg/dL (ref 8–23)
CO2: 25 mmol/L (ref 22–32)
Calcium: 9.3 mg/dL (ref 8.9–10.3)
Chloride: 107 mmol/L (ref 98–111)
Creatinine, Ser: 0.82 mg/dL (ref 0.44–1.00)
GFR calc Af Amer: >60 mL/min (ref >60)
GFR calc non Af Amer: >60 mL/min (ref >60)
Glucose, Bld: 107 mg/dL — ABNORMAL HIGH (ref 70–99)
Potassium: 4.1 mmol/L (ref 3.5–5.1)
Sodium: 140 mmol/L (ref 135–145)

## 2018-10-11 LAB — SURGICAL PCR SCREEN
MRSA, PCR: NEGATIVE
Staphylococcus aureus: NEGATIVE

## 2018-10-11 LAB — TYPE AND SCREEN
ABO/RH(D): A POS
Antibody Screen: NEGATIVE

## 2018-10-11 MED ORDER — CHLORHEXIDINE GLUCONATE CLOTH 2 % EX PADS: 6.0000 | MEDICATED_PAD | Freq: Once | CUTANEOUS | Status: DC

## 2018-10-11 NOTE — Progress Notes (Signed)
PCP - R HARRIS Cardiologist - [place]     Chest x-ray - 8/16 EKG - TODAY Stress Test - 2016 ECHO -2/20  Cardiac Cath - 1999  Sleep Study - YES CPAP - NO  -     Aspirin Instructions:STOP  -  -   COVID TEST- YODAY   Anesthesia review: REVIEW EKG,HEART HX Patient denies shortness of breath, fever, cough and chest pain at PAT appointment   Patient verbalized understanding of instructions that were given to them at the PAT appointment. Patient was also instructed that they will need to review over the PAT instructions again at home before surgery.

## 2018-10-12 ENCOUNTER — Ambulatory Visit (INDEPENDENT_AMBULATORY_CARE_PROVIDER_SITE_OTHER): Payer: Medicare Other | Admitting: Psychiatry

## 2018-10-12 ENCOUNTER — Other Ambulatory Visit: Payer: Self-pay | Admitting: Neurosurgery

## 2018-10-12 ENCOUNTER — Other Ambulatory Visit: Payer: Self-pay

## 2018-10-12 DIAGNOSIS — F4010 Social phobia, unspecified: Secondary | ICD-10-CM | POA: Diagnosis not present

## 2018-10-12 DIAGNOSIS — M546 Pain in thoracic spine: Secondary | ICD-10-CM

## 2018-10-12 DIAGNOSIS — F429 Obsessive-compulsive disorder, unspecified: Secondary | ICD-10-CM | POA: Diagnosis not present

## 2018-10-12 LAB — NOVEL CORONAVIRUS, NAA (HOSP ORDER, SEND-OUT TO REF LAB; TAT 18-24 HRS): SARS-CoV-2, NAA: NOT DETECTED

## 2018-10-12 MED ORDER — DEXTROSE 5 % IV SOLN
3.0000 g | INTRAVENOUS | Status: AC
  Administered 2018-10-15 (×2): 3 g via INTRAVENOUS
  Filled 2018-10-12: qty 3

## 2018-10-12 NOTE — Progress Notes (Signed)
Anesthesia Chart Review: Pt has a history of atypical chest pain and has had multiple cardiac workups. She had a nuclear stress 16 years ago which was demonstrated to be a false positive by coronary angiography showing normal coronaries. She had another nuclear stress in 2016 showing a small region of "inferior ischemia", however the study was felt to be low risk and no further evaluation recommended at that time. Most recently she was seen by Dr. Tamala Julian 05/31/18 with c/o DOE and chest discomfort. Her discomfort was felt to be atypical, however due to multiple risk factors a coronary CT was ordered which showed calcium score of 0, normal coronary origin with right dominance, mild noncalcified plaque in the proximal RCA with 25-49% stenosis. Dr. Tamala Julian commented on the results stating "Let the patient know the heart scan looks good and needs there is only minimal plaque. Needs to continue aggressive RFM to prevent worsening. It is not causing current complaints."  Preop labs reviewed, WNL.   Coronary CT 06/19/18: IMPRESSION: 1. Coronary calcium score of 0. This was 0 percentile for age and sex matched control.  2. Normal coronary origin with right dominance.  3. Mild noncalcified plaque in the proximal RCA with 25-49% stenosis.   Wynonia Musty [facility]/Anesthesiology Phone [phone] 10/12/2018 9:37 AM

## 2018-10-12 NOTE — Progress Notes (Signed)
Psychotherapy Progress Note [facility] Kristin Moore, PhD LP  Patient ID: [name]     MRN: [id number]     Therapy format: Individual psychotherapy Date: 10/12/2018     Start: 11:13a Stop: 11:53a Time Spent: 40 min Location: telehealth   Telehealth visit -- I connected with this patient by an approved telecommunication method (video), with her informed consent, and verifying identity and patient privacy.  I was located at my office and patient at her home.  As needed, we discussed the limitations, risks, and security and privacy concerns associated with telehealth service, including the availability and conditions which currently govern in-person appointments and the possibility that 3rd-party payment may not be fully guaranteed and she may be responsible for charges.  After she indicated understanding, we proceeded with the session.  Also discussed treatment planning, as needed, including ongoing verbal agreement with the plan, the opportunity to ask and answer all questions, her demonstrated understanding of instructions, and her readiness to call the office should symptoms worsen or she feels she is in a crisis state and needs more immediate and tangible assistance.  Session narrative (presenting needs, interim history, self-report of stressors and symptoms, applications of prior therapy, status changes, and interventions made in session) Back surgery coming Monday.  "Basket case" since getting the date, feeling indecision about it.  Really, just afflicted with "what if" thinking about not waking up, dying, being unsaved.  Has applied recommendation to also think of "What if it works?" and got some hours of relief.  Has also made a decision, with conviction, to have orthopedic surgery rather than nerve ablation.  Reviewed her good reasons and reaffirmed.  Affirmed God's love for her per her faith, referencing the "Neither heights nor depths" passage in Romans.   Appreciates the "Just As I Am" meditation from earlier.    Refreshed understanding that OCD is originally just a skip in her emotional brain, a stray neural impulse, which took the form of spiritual/scrupulosity based on her experience, and she re-wires that every time she acknowledges it and goes on anyway to trust and to do what is reasonable, OK, not overwrought.  PT's idea to make a few notes to go with her.  Discussed sharing with 1 good friend, husband, possibly daughter to set up the ability to be reminded if needed.  Normally keeps it all secret, but H knows, and daughter.  Encouraged to bring in best friend in knowing better about surgical anxiety, maybe even OCD, too.  Finished off with healing-prayer image (assisted by video) of visualizing God's hands beneath her, as patient, along with the table, the O.R., the hospital ... the whole world.  Therapeutic modalities: Cognitive Behavioral Therapy and faith-senstive  Mental Status/Observations:  Appearance:   Casual     Behavior:  Appropriate  Motor:  Normal  Speech/Language:   Clear and Coherent  Affect:  brighter, moe easygoing  Mood:  anxious and controlled  Thought process:  normal  Thought content:    Obsessions and under more control  Sensory/Perceptual disturbances:    WNL  Orientation:  grossly intact  Attention:  Good  Concentration:  Good  Memory:  WNL  Insight:    Good  Judgment:   Good  Impulse Control:  Good   Risk Assessment: Danger to Self: No Self-injurious Behavior: No Danger to Others: No Physical Aggression / Violence: No Duty to Warn: No Access to Firearms a concern: No  Assessment of progress:  progressing  Diagnosis:  ICD-10-CM   1. Obsessive-compulsive disorder, unspecified type  F42.9   2. Social anxiety disorder  F40.10   3. Right-sided thoracic back pain, unspecified chronicity  M54.6     Plan:  . Share coping thoughts with family and best friend to ready mental/emotional support for worry  that may come . Continue using 2-screen technique . Visualization and scripture to reinforce sense of divine support . Other recommendations/advice as noted above . Continue to utilize previously learned skills ad lib . Maintain medication as prescribed and work faithfully with relevant prescriber(s) if any changes are desired or seem indicated . Call the clinic on-call service, present to ER, or call 911 if any life-threatening psychiatric crisis Return in about 3 weeks (around 11/02/2018).  [name], PhD Kristin Moore, PhD LP Clinical Psychologist, [facility] [address] [place], [place] [address] [phone]

## 2018-10-12 NOTE — Anesthesia Preprocedure Evaluation (Addendum)
Anesthesia Evaluation  Patient identified by MRN, date of birth, ID band Patient awake    Reviewed: Allergy & Precautions, NPO status , Patient's Chart, lab work & pertinent test results, reviewed documented beta blocker date and time   History of Anesthesia Complications (+) PONV  Airway Mallampati: III  TM Distance: >3 FB Neck ROM: Full    Dental  (+) Partial Upper, Dental Advisory Given   Pulmonary sleep apnea ,    breath sounds clear to auscultation       Cardiovascular hypertension, Pt. on home beta blockers + dysrhythmias  Rhythm:Regular Rate:Normal     Neuro/Psych PSYCHIATRIC DISORDERS Anxiety Depression  Neuromuscular disease    GI/Hepatic Neg liver ROS, hiatal hernia, GERD  Medicated and Controlled,  Endo/Other  Hypothyroidism   Renal/GU negative Renal ROS     Musculoskeletal   Abdominal Normal abdominal exam  (+)   Peds  Hematology   Anesthesia Other Findings   Reproductive/Obstetrics                           Anesthesia Physical Anesthesia Plan  ASA: III  Anesthesia Plan: General   Post-op Pain Management:    Induction: Intravenous  PONV Risk Score and Plan: 4 or greater and Dexamethasone, Ondansetron, Midazolam and Treatment may vary due to age or medical condition  Airway Management Planned: Oral ETT  Additional Equipment: None  Intra-op Plan:   Post-operative Plan: Extubation in OR  Informed Consent:   Plan Discussed with: CRNA  Anesthesia Plan Comments: (Pt has a history of atypical chest pain and has had multiple cardiac workups. She had a nuclear stress 16 years ago which was demonstrated to be a false positive by coronary angiography showing normal coronaries. She had another nuclear stress in 2016 showing a small region of "inferior ischemia", however the study was felt to be low risk and no further evaluation recommended at that time. Most recently she was  seen by Dr. Tamala Julian 05/31/18 with c/o DOE and chest discomfort. Her discomfort was felt to be atypical, however due to multiple risk factors a coronary CT was ordered which showed calcium score of 0, normal coronary origin with right dominance, mild noncalcified plaque in the proximal RCA with 25-49% stenosis. Dr. Tamala Julian commented on the results stating "Let the patient know the heart scan looks good and needs there is only minimal plaque. Needs to continue aggressive RFM to prevent worsening. It is not causing current complaints."  Preop labs reviewed, WNL.   Coronary CT 06/19/18: IMPRESSION: 1. Coronary calcium score of 0. This was 0 percentile for age and sex matched control.  2. Normal coronary origin with right dominance.  3. Mild noncalcified plaque in the proximal RCA with 25-49% stenosis.  )      Anesthesia Quick Evaluation

## 2018-10-14 ENCOUNTER — Encounter (HOSPITAL_COMMUNITY): Payer: Self-pay | Admitting: Anesthesiology

## 2018-10-15 ENCOUNTER — Encounter (HOSPITAL_COMMUNITY): Payer: Self-pay | Admitting: *Deleted

## 2018-10-15 ENCOUNTER — Inpatient Hospital Stay (HOSPITAL_COMMUNITY): Payer: Medicare Other | Admitting: Physician Assistant

## 2018-10-15 ENCOUNTER — Inpatient Hospital Stay (HOSPITAL_COMMUNITY): Payer: Medicare Other

## 2018-10-15 ENCOUNTER — Encounter (HOSPITAL_COMMUNITY)
Admission: RE | Disposition: A | Discharge status: Home / Self Care | Payer: Self-pay | Source: Home / Self Care | Attending: Neurosurgery

## 2018-10-15 ENCOUNTER — Inpatient Hospital Stay (HOSPITAL_COMMUNITY): Payer: Medicare Other | Admitting: Anesthesiology

## 2018-10-15 ENCOUNTER — Other Ambulatory Visit: Payer: Self-pay

## 2018-10-15 ENCOUNTER — Inpatient Hospital Stay (HOSPITAL_COMMUNITY)
Admission: RE | Admit: 2018-10-15 | Discharge: 2018-10-17 | DRG: 454 | Disposition: A | Discharge status: Home / Self Care | Payer: Medicare Other | Attending: Neurosurgery | Admitting: Neurosurgery

## 2018-10-15 DIAGNOSIS — Z888 Allergy status to other drugs, medicaments and biological substances status: Secondary | ICD-10-CM | POA: Diagnosis not present

## 2018-10-15 DIAGNOSIS — M4316 Spondylolisthesis, lumbar region: Secondary | ICD-10-CM | POA: Diagnosis present

## 2018-10-15 DIAGNOSIS — I10 Essential (primary) hypertension: Secondary | ICD-10-CM | POA: Diagnosis present

## 2018-10-15 DIAGNOSIS — F419 Anxiety disorder, unspecified: Secondary | ICD-10-CM | POA: Diagnosis present

## 2018-10-15 DIAGNOSIS — M5116 Intervertebral disc disorders with radiculopathy, lumbar region: Secondary | ICD-10-CM | POA: Diagnosis present

## 2018-10-15 DIAGNOSIS — K449 Diaphragmatic hernia without obstruction or gangrene: Secondary | ICD-10-CM | POA: Diagnosis present

## 2018-10-15 DIAGNOSIS — K219 Gastro-esophageal reflux disease without esophagitis: Secondary | ICD-10-CM | POA: Diagnosis present

## 2018-10-15 DIAGNOSIS — Z6841 Body Mass Index (BMI) 40.0 and over, adult: Secondary | ICD-10-CM

## 2018-10-15 DIAGNOSIS — Z87442 Personal history of urinary calculi: Secondary | ICD-10-CM

## 2018-10-15 DIAGNOSIS — G629 Polyneuropathy, unspecified: Secondary | ICD-10-CM | POA: Diagnosis present

## 2018-10-15 DIAGNOSIS — Z881 Allergy status to other antibiotic agents status: Secondary | ICD-10-CM | POA: Diagnosis not present

## 2018-10-15 DIAGNOSIS — M4807 Spinal stenosis, lumbosacral region: Secondary | ICD-10-CM | POA: Diagnosis present

## 2018-10-15 DIAGNOSIS — G4730 Sleep apnea, unspecified: Secondary | ICD-10-CM | POA: Diagnosis present

## 2018-10-15 DIAGNOSIS — Z91048 Other nonmedicinal substance allergy status: Secondary | ICD-10-CM

## 2018-10-15 DIAGNOSIS — Z79899 Other long term (current) drug therapy: Secondary | ICD-10-CM | POA: Diagnosis not present

## 2018-10-15 DIAGNOSIS — Z79890 Hormone replacement therapy (postmenopausal): Secondary | ICD-10-CM

## 2018-10-15 DIAGNOSIS — E039 Hypothyroidism, unspecified: Secondary | ICD-10-CM | POA: Diagnosis present

## 2018-10-15 DIAGNOSIS — M4317 Spondylolisthesis, lumbosacral region: Secondary | ICD-10-CM | POA: Diagnosis present

## 2018-10-15 DIAGNOSIS — Z885 Allergy status to narcotic agent status: Secondary | ICD-10-CM | POA: Diagnosis not present

## 2018-10-15 DIAGNOSIS — Z419 Encounter for procedure for purposes other than remedying health state, unspecified: Secondary | ICD-10-CM

## 2018-10-15 DIAGNOSIS — Z8249 Family history of ischemic heart disease and other diseases of the circulatory system: Secondary | ICD-10-CM | POA: Diagnosis not present

## 2018-10-15 DIAGNOSIS — M48062 Spinal stenosis, lumbar region with neurogenic claudication: Secondary | ICD-10-CM | POA: Diagnosis present

## 2018-10-15 DIAGNOSIS — E785 Hyperlipidemia, unspecified: Secondary | ICD-10-CM | POA: Diagnosis present

## 2018-10-15 SURGERY — POSTERIOR LUMBAR FUSION 2 LEVEL
Anesthesia: General

## 2018-10-15 MED ORDER — MEPERIDINE HCL 25 MG/ML IJ SOLN: 6.2500 mg | INTRAMUSCULAR | Status: DC | PRN

## 2018-10-15 MED ORDER — BUPIVACAINE-EPINEPHRINE (PF) 0.5% -1:200000 IJ SOLN
INTRAMUSCULAR | Status: AC
  Filled 2018-10-15: qty 30

## 2018-10-15 MED ORDER — LIDOCAINE 2% (20 MG/ML) 5 ML SYRINGE
INTRAMUSCULAR | Status: DC | PRN
  Administered 2018-10-15: 50 mg via INTRAVENOUS

## 2018-10-15 MED ORDER — PAROXETINE HCL 20 MG PO TABS
20.0000 mg | ORAL_TABLET | Freq: Two times a day (BID) | ORAL | Status: DC
  Administered 2018-10-15 – 2018-10-17 (×4): 20 mg via ORAL
  Filled 2018-10-15 (×5): qty 1

## 2018-10-15 MED ORDER — THROMBIN 20000 UNITS EX SOLR
CUTANEOUS | Status: AC
  Filled 2018-10-15: qty 20000

## 2018-10-15 MED ORDER — 0.9 % SODIUM CHLORIDE (POUR BTL) OPTIME
TOPICAL | Status: DC | PRN
  Administered 2018-10-15: 08:00:00 1000 mL

## 2018-10-15 MED ORDER — SODIUM CHLORIDE 0.9 % IV SOLN: 250.0000 mL | INTRAVENOUS | Status: DC

## 2018-10-15 MED ORDER — PROPRANOLOL HCL ER 60 MG PO CP24
60.0000 mg | ORAL_CAPSULE | Freq: Every day | ORAL | Status: DC
  Administered 2018-10-17: 60 mg via ORAL
  Filled 2018-10-15 (×2): qty 1

## 2018-10-15 MED ORDER — LORATADINE 10 MG PO TABS
10.0000 mg | ORAL_TABLET | Freq: Every day | ORAL | Status: DC
  Administered 2018-10-16 – 2018-10-17 (×2): 10 mg via ORAL
  Filled 2018-10-15 (×2): qty 1

## 2018-10-15 MED ORDER — PROPOFOL 10 MG/ML IV BOLUS
INTRAVENOUS | Status: DC | PRN
  Administered 2018-10-15: 20 mg via INTRAVENOUS
  Administered 2018-10-15: 150 mg via INTRAVENOUS
  Administered 2018-10-15: 20 mg via INTRAVENOUS

## 2018-10-15 MED ORDER — ZOLPIDEM TARTRATE 5 MG PO TABS
5.0000 mg | ORAL_TABLET | Freq: Every evening | ORAL | Status: DC | PRN
  Administered 2018-10-15: 23:00:00 5 mg via ORAL
  Filled 2018-10-15: qty 1

## 2018-10-15 MED ORDER — ONDANSETRON HCL 4 MG PO TABS: 4.0000 mg | ORAL_TABLET | Freq: Four times a day (QID) | ORAL | Status: DC | PRN

## 2018-10-15 MED ORDER — DEXAMETHASONE SODIUM PHOSPHATE 10 MG/ML IJ SOLN
INTRAMUSCULAR | Status: AC
  Filled 2018-10-15: qty 1

## 2018-10-15 MED ORDER — ONDANSETRON HCL 4 MG/2ML IJ SOLN
INTRAMUSCULAR | Status: AC
  Filled 2018-10-15: qty 2

## 2018-10-15 MED ORDER — PHENOL 1.4 % MT LIQD: 1.0000 | OROMUCOSAL | Status: DC | PRN

## 2018-10-15 MED ORDER — FENTANYL CITRATE (PF) 100 MCG/2ML IJ SOLN
INTRAMUSCULAR | Status: DC | PRN
  Administered 2018-10-15 (×5): 50 ug via INTRAVENOUS

## 2018-10-15 MED ORDER — METHOCARBAMOL 500 MG PO TABS
ORAL_TABLET | ORAL | Status: AC
  Filled 2018-10-15: qty 1

## 2018-10-15 MED ORDER — HYDROMORPHONE HCL 1 MG/ML IJ SOLN
1.0000 mg | INTRAMUSCULAR | Status: DC | PRN
  Administered 2018-10-15: 1 mg via INTRAVENOUS
  Filled 2018-10-15: qty 1

## 2018-10-15 MED ORDER — METHOCARBAMOL 750 MG PO TABS
750.0000 mg | ORAL_TABLET | Freq: Four times a day (QID) | ORAL | Status: DC | PRN
  Administered 2018-10-15 – 2018-10-17 (×8): 750 mg via ORAL
  Filled 2018-10-15 (×7): qty 1

## 2018-10-15 MED ORDER — SODIUM CHLORIDE 0.9% FLUSH
3.0000 mL | Freq: Two times a day (BID) | INTRAVENOUS | Status: DC
  Administered 2018-10-15: 3 mL via INTRAVENOUS

## 2018-10-15 MED ORDER — ACETAMINOPHEN 325 MG PO TABS
650.0000 mg | ORAL_TABLET | ORAL | Status: DC | PRN
  Administered 2018-10-16 – 2018-10-17 (×2): 650 mg via ORAL
  Filled 2018-10-15 (×2): qty 2

## 2018-10-15 MED ORDER — FENTANYL CITRATE (PF) 250 MCG/5ML IJ SOLN
INTRAMUSCULAR | Status: AC
  Filled 2018-10-15: qty 5

## 2018-10-15 MED ORDER — ROCURONIUM BROMIDE 10 MG/ML (PF) SYRINGE
PREFILLED_SYRINGE | INTRAVENOUS | Status: DC | PRN
  Administered 2018-10-15: 50 mg via INTRAVENOUS

## 2018-10-15 MED ORDER — LIDOCAINE 2% (20 MG/ML) 5 ML SYRINGE
INTRAMUSCULAR | Status: AC
  Filled 2018-10-15: qty 5

## 2018-10-15 MED ORDER — PHENYLEPHRINE 40 MCG/ML (10ML) SYRINGE FOR IV PUSH (FOR BLOOD PRESSURE SUPPORT)
PREFILLED_SYRINGE | INTRAVENOUS | Status: AC
  Filled 2018-10-15: qty 10

## 2018-10-15 MED ORDER — PRAMIPEXOLE DIHYDROCHLORIDE 0.25 MG PO TABS
0.2500 mg | ORAL_TABLET | Freq: Every day | ORAL | Status: DC
  Administered 2018-10-16 – 2018-10-17 (×2): 0.25 mg via ORAL
  Filled 2018-10-15 (×2): qty 1

## 2018-10-15 MED ORDER — BACITRACIN ZINC 500 UNIT/GM EX OINT
TOPICAL_OINTMENT | CUTANEOUS | Status: DC | PRN
  Administered 2018-10-15: 1 via TOPICAL

## 2018-10-15 MED ORDER — PROPOFOL 10 MG/ML IV BOLUS
INTRAVENOUS | Status: AC
  Filled 2018-10-15: qty 20

## 2018-10-15 MED ORDER — HYDROMORPHONE HCL 2 MG PO TABS
2.0000 mg | ORAL_TABLET | ORAL | Status: DC | PRN
  Administered 2018-10-15 – 2018-10-16 (×3): 2 mg via ORAL
  Administered 2018-10-16 (×2): 4 mg via ORAL
  Administered 2018-10-16 – 2018-10-17 (×3): 2 mg via ORAL
  Administered 2018-10-17: 4 mg via ORAL
  Administered 2018-10-17: 2 mg via ORAL
  Filled 2018-10-15 (×2): qty 2
  Filled 2018-10-15 (×7): qty 1
  Filled 2018-10-15: qty 2

## 2018-10-15 MED ORDER — ALPRAZOLAM 0.25 MG PO TABS
0.2500 mg | ORAL_TABLET | Freq: Three times a day (TID) | ORAL | Status: DC | PRN
  Administered 2018-10-15 – 2018-10-16 (×2): 0.25 mg via ORAL
  Filled 2018-10-15 (×2): qty 1

## 2018-10-15 MED ORDER — SODIUM CHLORIDE 0.9 % IV SOLN
INTRAVENOUS | Status: DC | PRN
  Administered 2018-10-15: 500 mL

## 2018-10-15 MED ORDER — ONDANSETRON HCL 4 MG/2ML IJ SOLN: 4.0000 mg | Freq: Four times a day (QID) | INTRAMUSCULAR | Status: DC | PRN

## 2018-10-15 MED ORDER — EZETIMIBE 10 MG PO TABS
10.0000 mg | ORAL_TABLET | Freq: Every day | ORAL | Status: DC
  Administered 2018-10-16 – 2018-10-17 (×2): 10 mg via ORAL
  Filled 2018-10-15 (×2): qty 1

## 2018-10-15 MED ORDER — LACTATED RINGERS IV SOLN
INTRAVENOUS | Status: DC | PRN
  Administered 2018-10-15 (×2): via INTRAVENOUS

## 2018-10-15 MED ORDER — BUPIVACAINE-EPINEPHRINE (PF) 0.5% -1:200000 IJ SOLN
INTRAMUSCULAR | Status: DC | PRN
  Administered 2018-10-15: 10 mL via PERINEURAL

## 2018-10-15 MED ORDER — THROMBIN 5000 UNITS EX SOLR
CUTANEOUS | Status: AC
  Filled 2018-10-15: qty 5000

## 2018-10-15 MED ORDER — PROMETHAZINE HCL 25 MG/ML IJ SOLN: 6.2500 mg | INTRAMUSCULAR | Status: DC | PRN

## 2018-10-15 MED ORDER — ACETAMINOPHEN 325 MG PO TABS: 325.0000 mg | ORAL_TABLET | Freq: Once | ORAL | Status: DC | PRN

## 2018-10-15 MED ORDER — HYDROMORPHONE HCL 1 MG/ML IJ SOLN
0.2500 mg | INTRAMUSCULAR | Status: DC | PRN
  Administered 2018-10-15: 0.5 mg via INTRAVENOUS

## 2018-10-15 MED ORDER — MIDAZOLAM HCL 5 MG/5ML IJ SOLN
INTRAMUSCULAR | Status: DC | PRN
  Administered 2018-10-15: 2 mg via INTRAVENOUS

## 2018-10-15 MED ORDER — LACTATED RINGERS IV SOLN: INTRAVENOUS | Status: DC

## 2018-10-15 MED ORDER — ONDANSETRON HCL 4 MG/2ML IJ SOLN
INTRAMUSCULAR | Status: DC | PRN
  Administered 2018-10-15: 4 mg via INTRAVENOUS

## 2018-10-15 MED ORDER — LEVOTHYROXINE SODIUM 200 MCG PO TABS
200.0000 ug | ORAL_TABLET | Freq: Every day | ORAL | Status: DC
  Administered 2018-10-16 – 2018-10-17 (×2): 200 ug via ORAL
  Filled 2018-10-15: qty 2
  Filled 2018-10-15 (×3): qty 1

## 2018-10-15 MED ORDER — BACITRACIN ZINC 500 UNIT/GM EX OINT
TOPICAL_OINTMENT | CUTANEOUS | Status: AC
  Filled 2018-10-15: qty 28.35

## 2018-10-15 MED ORDER — MENTHOL 3 MG MT LOZG: 1.0000 | LOZENGE | OROMUCOSAL | Status: DC | PRN

## 2018-10-15 MED ORDER — PHENYLEPHRINE 40 MCG/ML (10ML) SYRINGE FOR IV PUSH (FOR BLOOD PRESSURE SUPPORT)
PREFILLED_SYRINGE | INTRAVENOUS | Status: DC | PRN
  Administered 2018-10-15 (×2): 120 ug via INTRAVENOUS
  Administered 2018-10-15: 80 ug via INTRAVENOUS

## 2018-10-15 MED ORDER — ACETAMINOPHEN 650 MG RE SUPP: 650.0000 mg | RECTAL | Status: DC | PRN

## 2018-10-15 MED ORDER — ACETAMINOPHEN 10 MG/ML IV SOLN
INTRAVENOUS | Status: AC
  Filled 2018-10-15: qty 100

## 2018-10-15 MED ORDER — SODIUM CHLORIDE 0.9 % IV SOLN
INTRAVENOUS | Status: DC | PRN
  Administered 2018-10-15: 25 ug/min via INTRAVENOUS

## 2018-10-15 MED ORDER — BISACODYL 10 MG RE SUPP: 10.0000 mg | Freq: Every day | RECTAL | Status: DC | PRN

## 2018-10-15 MED ORDER — DEXTROSE 5 % IV SOLN
3.0000 g | Freq: Three times a day (TID) | INTRAVENOUS | Status: DC
  Administered 2018-10-15: 3 g via INTRAVENOUS
  Filled 2018-10-15 (×2): qty 3000

## 2018-10-15 MED ORDER — DEXAMETHASONE SODIUM PHOSPHATE 10 MG/ML IJ SOLN
INTRAMUSCULAR | Status: DC | PRN
  Administered 2018-10-15: 10 mg via INTRAVENOUS

## 2018-10-15 MED ORDER — SUGAMMADEX SODIUM 200 MG/2ML IV SOLN
INTRAVENOUS | Status: DC | PRN
  Administered 2018-10-15: 250 mg via INTRAVENOUS

## 2018-10-15 MED ORDER — ACETAMINOPHEN 10 MG/ML IV SOLN
1000.0000 mg | Freq: Once | INTRAVENOUS | Status: AC
  Administered 2018-10-15: 1000 mg via INTRAVENOUS

## 2018-10-15 MED ORDER — SODIUM CHLORIDE 0.9% FLUSH: 3.0000 mL | INTRAVENOUS | Status: DC | PRN

## 2018-10-15 MED ORDER — EPHEDRINE 5 MG/ML INJ
INTRAVENOUS | Status: AC
  Filled 2018-10-15: qty 10

## 2018-10-15 MED ORDER — ACETAMINOPHEN 500 MG PO TABS
1000.0000 mg | ORAL_TABLET | Freq: Four times a day (QID) | ORAL | Status: AC
  Administered 2018-10-15 – 2018-10-16 (×4): 1000 mg via ORAL
  Filled 2018-10-15 (×4): qty 2

## 2018-10-15 MED ORDER — BUPIVACAINE LIPOSOME 1.3 % IJ SUSP
INTRAMUSCULAR | Status: DC | PRN
  Administered 2018-10-15: 20 mL

## 2018-10-15 MED ORDER — THROMBIN 5000 UNITS EX SOLR
OROMUCOSAL | Status: DC | PRN
  Administered 2018-10-15 (×2): 5 mL via TOPICAL

## 2018-10-15 MED ORDER — DEXTROSE 5 % IV SOLN
3.0000 g | Freq: Once | INTRAVENOUS | Status: DC
  Filled 2018-10-15: qty 3000

## 2018-10-15 MED ORDER — ACETAMINOPHEN 160 MG/5ML PO SOLN: 325.0000 mg | Freq: Once | ORAL | Status: DC | PRN

## 2018-10-15 MED ORDER — MIDAZOLAM HCL 2 MG/2ML IJ SOLN
INTRAMUSCULAR | Status: AC
  Filled 2018-10-15: qty 2

## 2018-10-15 MED ORDER — EPHEDRINE SULFATE-NACL 50-0.9 MG/10ML-% IV SOSY
PREFILLED_SYRINGE | INTRAVENOUS | Status: DC | PRN
  Administered 2018-10-15: 10 mg via INTRAVENOUS
  Administered 2018-10-15: 5 mg via INTRAVENOUS

## 2018-10-15 MED ORDER — DOCUSATE SODIUM 100 MG PO CAPS
100.0000 mg | ORAL_CAPSULE | Freq: Two times a day (BID) | ORAL | Status: DC
  Administered 2018-10-15 – 2018-10-17 (×4): 100 mg via ORAL
  Filled 2018-10-15 (×4): qty 1

## 2018-10-15 MED ORDER — ACETAMINOPHEN 10 MG/ML IV SOLN: 1000.0000 mg | Freq: Once | INTRAVENOUS | Status: DC | PRN

## 2018-10-15 MED ORDER — BUPIVACAINE LIPOSOME 1.3 % IJ SUSP
20.0000 mL | Freq: Once | INTRAMUSCULAR | Status: DC
  Filled 2018-10-15: qty 20

## 2018-10-15 MED ORDER — ALBUMIN HUMAN 5 % IV SOLN
INTRAVENOUS | Status: DC | PRN
  Administered 2018-10-15: 09:00:00 via INTRAVENOUS

## 2018-10-15 MED ORDER — PRAMIPEXOLE DIHYDROCHLORIDE 0.25 MG PO TABS
0.3750 mg | ORAL_TABLET | Freq: Every day | ORAL | Status: DC
  Administered 2018-10-15 – 2018-10-16 (×2): 0.375 mg via ORAL
  Filled 2018-10-15 (×2): qty 2

## 2018-10-15 MED ORDER — FLUTICASONE PROPIONATE 50 MCG/ACT NA SUSP
1.0000 | Freq: Every day | NASAL | Status: DC | PRN
  Filled 2018-10-15: qty 16

## 2018-10-15 MED ORDER — HYDROMORPHONE HCL 1 MG/ML IJ SOLN
INTRAMUSCULAR | Status: AC
  Filled 2018-10-15: qty 1

## 2018-10-15 SURGICAL SUPPLY — 71 items
BAG DECANTER FOR FLEXI CONT (MISCELLANEOUS) ×2 IMPLANT
BASKET BONE COLLECTION (BASKET) ×2 IMPLANT
BENZOIN TINCTURE PRP APPL 2/3 (GAUZE/BANDAGES/DRESSINGS) ×2 IMPLANT
BLADE CLIPPER SURG (BLADE) IMPLANT
BUR MATCHSTICK NEURO 3.0 LAGG (BURR) ×4 IMPLANT
BUR PRECISION FLUTE 6.0 (BURR) ×2 IMPLANT
CAGE ALTERA 10X31X10-14 15D (Cage) ×2 IMPLANT
CANISTER SUCT 3000ML PPV (MISCELLANEOUS) ×2 IMPLANT
CAP REVERE LOCKING (Cap) ×12 IMPLANT
CARTRIDGE OIL MAESTRO DRILL (MISCELLANEOUS) ×1 IMPLANT
CONT SPEC 4OZ CLIKSEAL STRL BL (MISCELLANEOUS) ×2 IMPLANT
COVER BACK TABLE 60X90IN (DRAPES) ×2 IMPLANT
COVER WAND RF STERILE (DRAPES) ×2 IMPLANT
DECANTER SPIKE VIAL GLASS SM (MISCELLANEOUS) ×2 IMPLANT
DERMABOND ADVANCED (GAUZE/BANDAGES/DRESSINGS) ×1
DERMABOND ADVANCED .7 DNX12 (GAUZE/BANDAGES/DRESSINGS) ×1 IMPLANT
DIFFUSER DRILL AIR PNEUMATIC (MISCELLANEOUS) ×2 IMPLANT
DRAPE C-ARM 42X72 X-RAY (DRAPES) ×4 IMPLANT
DRAPE HALF SHEET 40X57 (DRAPES) ×2 IMPLANT
DRAPE LAPAROTOMY 100X72X124 (DRAPES) ×2 IMPLANT
DRAPE SURG 17X23 STRL (DRAPES) ×8 IMPLANT
ELECT BLADE 4.0 EZ CLEAN MEGAD (MISCELLANEOUS) ×2
ELECT REM PT RETURN 9FT ADLT (ELECTROSURGICAL) ×2
ELECTRODE BLDE 4.0 EZ CLN MEGD (MISCELLANEOUS) ×1 IMPLANT
ELECTRODE REM PT RTRN 9FT ADLT (ELECTROSURGICAL) ×1 IMPLANT
GAUZE 4X4 16PLY RFD (DISPOSABLE) ×2 IMPLANT
GAUZE SPONGE 4X4 12PLY STRL (GAUZE/BANDAGES/DRESSINGS) ×2 IMPLANT
GLOVE BIO SURGEON STRL SZ7 (GLOVE) ×2 IMPLANT
GLOVE BIO SURGEON STRL SZ8 (GLOVE) ×4 IMPLANT
GLOVE BIO SURGEON STRL SZ8.5 (GLOVE) ×4 IMPLANT
GLOVE BIOGEL PI IND STRL 7.5 (GLOVE) ×1 IMPLANT
GLOVE BIOGEL PI IND STRL 8 (GLOVE) ×4 IMPLANT
GLOVE BIOGEL PI INDICATOR 7.5 (GLOVE) ×1
GLOVE BIOGEL PI INDICATOR 8 (GLOVE) ×4
GLOVE ECLIPSE 7.5 STRL STRAW (GLOVE) ×8 IMPLANT
GLOVE ECLIPSE 9.0 STRL (GLOVE) ×2 IMPLANT
GLOVE EXAM NITRILE XL STR (GLOVE) IMPLANT
GOWN STRL REUS W/ TWL LRG LVL3 (GOWN DISPOSABLE) ×2 IMPLANT
GOWN STRL REUS W/ TWL XL LVL3 (GOWN DISPOSABLE) ×3 IMPLANT
GOWN STRL REUS W/TWL 2XL LVL3 (GOWN DISPOSABLE) ×4 IMPLANT
GOWN STRL REUS W/TWL LRG LVL3 (GOWN DISPOSABLE) ×2
GOWN STRL REUS W/TWL XL LVL3 (GOWN DISPOSABLE) ×3
HEMOSTAT POWDER KIT SURGIFOAM (HEMOSTASIS) ×4 IMPLANT
KIT BASIN OR (CUSTOM PROCEDURE TRAY) ×2 IMPLANT
KIT TURNOVER KIT B (KITS) ×2 IMPLANT
MILL MEDIUM DISP (BLADE) IMPLANT
NEEDLE HYPO 21X1.5 SAFETY (NEEDLE) ×2 IMPLANT
NEEDLE HYPO 22GX1.5 SAFETY (NEEDLE) ×2 IMPLANT
NS IRRIG 1000ML POUR BTL (IV SOLUTION) ×2 IMPLANT
OIL CARTRIDGE MAESTRO DRILL (MISCELLANEOUS) ×2
PACK LAMINECTOMY NEURO (CUSTOM PROCEDURE TRAY) ×2 IMPLANT
PAD ARMBOARD 7.5X6 YLW CONV (MISCELLANEOUS) IMPLANT
PATTIES SURGICAL .5 X1 (DISPOSABLE) IMPLANT
PATTIES SURGICAL 1X1 (DISPOSABLE) ×2 IMPLANT
PUTTY DBM 10CC CALC GRAN (Putty) ×4 IMPLANT
ROD REVERE CURVED 65MM (Rod) ×4 IMPLANT
SCREW 7.5X50MM (Screw) ×8 IMPLANT
SCREW REVERE 6.35 7.5X40 (Screw) ×4 IMPLANT
SPACER ALTERA 10X31-15 (Spacer) ×2 IMPLANT
SPONGE LAP 4X18 RFD (DISPOSABLE) IMPLANT
SPONGE NEURO XRAY DETECT 1X3 (DISPOSABLE) ×2 IMPLANT
SPONGE SURGIFOAM ABS GEL 100 (HEMOSTASIS) IMPLANT
STRIP CLOSURE SKIN 1/2X4 (GAUZE/BANDAGES/DRESSINGS) ×2 IMPLANT
SUT VIC AB 1 CT1 18XBRD ANBCTR (SUTURE) ×2 IMPLANT
SUT VIC AB 1 CT1 8-18 (SUTURE) ×2
SUT VIC AB 2-0 CP2 18 (SUTURE) ×4 IMPLANT
SYR 20ML LL LF (SYRINGE) ×2 IMPLANT
TOWEL GREEN STERILE (TOWEL DISPOSABLE) ×2 IMPLANT
TOWEL GREEN STERILE FF (TOWEL DISPOSABLE) ×2 IMPLANT
TRAY FOLEY MTR SLVR 16FR STAT (SET/KITS/TRAYS/PACK) ×2 IMPLANT
WATER STERILE IRR 1000ML POUR (IV SOLUTION) ×2 IMPLANT

## 2018-10-15 NOTE — Transfer of Care (Signed)
Immediate Anesthesia Transfer of Care Note  Patient: Kristin Gronseth  Procedure(s) Performed: POSTERIOR LUMBAR INTERBODY FUSION, POSTERIOR INSTRUMENTATION LUMBAR FOUR LUMBAR FIVE, LUMBAR FIVE- SACRAL ONE (N/A )  Patient Location: PACU  Anesthesia Type:General  Level of Consciousness: awake, oriented and patient cooperative  Airway & Oxygen Therapy: Patient Spontanous Breathing and Patient connected to face mask oxygen  Post-op Assessment: Report given to RN and Post -op Vital signs reviewed and stable  Post vital signs: Reviewed  Last Vitals:  Vitals Value Taken Time  BP    Temp    Pulse    Resp    SpO2      Last Pain:  Vitals:   10/15/18 0607  PainSc: 4       Patients Stated Pain Goal: 4 ([id number])  Complications: No apparent anesthesia complications

## 2018-10-15 NOTE — H&P (Signed)
Subjective: The patient is a 63 year old white female who complained of back and leg pain consistent with neurogenic claudication.  She has failed medical management and was worked up with lumbar x-rays, lumbar MRI and a lumbar myelo CT.  This demonstrated L4-5 and L5-S1 spinal listhesis, facet arthropathy, degenerative disc disease, etc.  I discussed the various treatment option.  She has decided to proceed with surgery.  Past Medical History:  Diagnosis Date  . Anginal pain ([place])    "on and off" (11/01/2011) - anxiety related  . Anxiety    takes Paxil daily and Xanax prn  . Arthritis    "knees" (10/16/2013)  . Chronic back pain    facet disease and bulging disc; "thoracic and lower back" (10/16/2013)  . Constipation    r/t pain meds;takes stool softener daily  . Depression   . Dysrhythmia    rapid HR on occasion-takes Propranolol daily  . Family history of anesthesia complication    ":PONV; mom and sisters"  . GERD (gastroesophageal reflux disease)    takes Prilosec   . H/O hiatal hernia   . History of bladder infections    > 73yr ago  . History of kidney stones   . Hyperlipidemia    takes LIpitor daily  . Hypothyroidism    takes Synthroid daily  . Insomnia   . Joint pain   . Joint swelling   . Muscle spasms of head or neck    lumbar and thoracic;takes Robaxin prn  . Peripheral neuropathy   . PONV (postoperative nausea and vomiting)   . Rosacea   . Seasonal allergies    takes allegra prn  . Sleep apnea     Past Surgical History:  Procedure Laterality Date  . ABDOMINAL HYSTERECTOMY  1980's  . ANAL FISSURE REPAIR  ~ 2011   "banded hemorrhoids at this time too"  . BARIATRIC SURGERY    . CARDIAC CATHETERIZATION  ~ 1999  . CARPAL TUNNEL RELEASE Left 09/13/2017   & ulnar nerve release  . CERVICAL DISC SURGERY  03/2017   C5, C6   . COLONOSCOPY    . ESOPHAGOGASTRODUODENOSCOPY    . JOINT REPLACEMENT     left knee  . KNEE ARTHROPLASTY Left 2007  . KNEE ARTHROSCOPY  Right   . KNEE ARTHROSCOPY Right    "torn meniscus"  . KNEE SURGERY Left 1982-2013   "17 before 10/31/2013"  . LEFT OOPHORECTOMY  1980's  . PATELLECTOMY Right   . PLANTAR FASCIA SURGERY Bilateral 1990's  . REVISION TOTAL KNEE ARTHROPLASTY Left 11/01/2011  . TOTAL KNEE ARTHROPLASTY Right 10/15/2013   Procedure: RIGHT TOTAL KNEE ARTHROPLASTY;  Surgeon: Garald Balding, MD;  Location: [place];  Service: Orthopedics;  Laterality: Right;  . TOTAL KNEE REVISION  11/01/2011   Procedure: TOTAL KNEE REVISION;  Surgeon: Garald Balding, MD;  Location: [place];  Service: Orthopedics;  Laterality: Left;  Revision Left Total Knee Replacement   . TUBAL LIGATION  1981    Allergies  Allergen Reactions  . Adhesive [Tape] Other (See Comments)    Blisters with tape after surgical procedures  . Morphine And Related Other (See Comments)    Feels like bugs crawling over her  . Nsaids Hives  . Buspar [Buspirone] Palpitations  . Ciprofloxacin Nausea Only    Upset stomach   . Codeine Other (See Comments)    Hyper  . Gabapentin Other (See Comments)    Suicidal ideation.  . Lodine [Etodolac] Nausea Only    Upset  Stomach   . Lyrica [Pregabalin] Other (See Comments)    suicidal ideation  . Niacin And Related Other (See Comments)    flushing  . Pravastatin Other (See Comments)    achiness   . Septra [Sulfamethoxazole-Trimethoprim] Other (See Comments)    Stomach pains  . Tylox [Oxycodone-Acetaminophen] Other (See Comments)    Hyper  . Wellbutrin [Bupropion] Other (See Comments)    dizzy    Social History   Tobacco Use  . Smoking status: Never Smoker  . Smokeless tobacco: Never Used  Substance Use Topics  . Alcohol use: No    Family History  Problem Relation Age of Onset  . Heart disease Mother 35  . Heart failure Mother   . Heart attack Father   . Heart disease Father 53   Prior to Admission medications   Medication Sig Start Date End Date Taking? Authorizing Provider  acetaminophen  (TYLENOL) 500 MG tablet Take 1,000 mg by mouth every 6 (six) hours as needed (pain.).   Yes [provider]  ALPRAZolam Duanne Moron) 0.5 MG tablet Take 0.5 tablets (0.25 mg total) by mouth 3 (three) times daily as needed for anxiety. 05/14/18  Yes Cottle, Billey Co., MD  amoxicillin (AMOXIL) 500 MG capsule Take 2,000 mg by mouth See admin instructions. Take 4 capsules (2000 mg) by mouth 1 hour prior to dental procedure.   Yes [provider]  Azelaic Acid 15 % cream Apply 1 application topically 2 (two) times daily. Facial Gel 06/15/18  Yes [provider]  b complex vitamins capsule Take 1 capsule by mouth daily.   Yes [provider]  Cholecalciferol (VITAMIN D-3) 5000 UNITS TABS Take 5,000 Units by mouth daily.   Yes [provider]  ezetimibe (ZETIA) 10 MG tablet Take 10 mg by mouth daily.  01/23/17  Yes [provider]  fexofenadine (ALLEGRA) 180 MG tablet Take 180 mg by mouth daily as needed for allergies.    Yes [provider]  fluticasone (FLONASE) 50 MCG/ACT nasal spray Place 1-2 sprays into both nostrils daily as needed for allergies or rhinitis.   Yes [provider]  HYDROmorphone (DILAUDID) 2 MG tablet Take 2-4 mg by mouth every 6 (six) hours as needed for severe pain (pain.).    Yes [provider]  methocarbamol (ROBAXIN) 750 MG tablet Take 750 mg by mouth every 6 (six) hours as needed for muscle spasms.  02/25/17  Yes [provider]  NON FORMULARY Apply 1 application topically as needed (pain.). Deep Blue essential oil blend   Yes [provider]  OIL BASE EX Apply 1 application topically as needed (pain.). [employer] Deep Relief   Yes [provider]  oxyCODONE-acetaminophen (PERCOCET/ROXICET) 5-325 MG tablet Take 1 tablet by mouth every 6 (six) hours as needed for severe pain (pain.).   Yes [provider]  PARoxetine (PAXIL) 20 MG tablet 1 tablet twice a day and 1 tablet  at bedtime Patient taking differently: Take 20 mg by mouth 2 (two) times daily.  06/05/18  Yes Cottle, Billey Co., MD  Pediatric Multivitamins-[facility] COMPLETE PO) Take 1 tablet by mouth 2 (two) times daily.   Yes [provider]  peppermint oil liquid Apply 1 application topically as needed (pain.). Peppermint essential oil   Yes [provider]  pramipexole (MIRAPEX) 0.25 MG tablet 1 in the morning and 1 1/2 tablets at night Patient taking differently: Take 0.25 mg by mouth See admin instructions. Take 1 tablet (  0.25 mg) by mouth in the morning & up to 1.5 tablets (0.375 mg) by mouth at night. 05/14/18  Yes Cottle, Billey Co., MD  propranolol ER (INDERAL LA) 60 MG 24 hr capsule Take 1 capsule (60 mg total) by mouth daily. 05/14/18  Yes Cottle, Billey Co., MD  SYNTHROID 200 MCG tablet Take 200 mcg by mouth daily before breakfast. 08/24/18  Yes [provider]  Zinc 50 MG TABS Take 25 mg by mouth daily.   Yes [provider]     Review of Systems  Positive ROS: As above  All other systems have been reviewed and were otherwise negative with the exception of those mentioned in the HPI and as above.  Objective: Vital signs in last 24 hours: Temp:  [98.5 F (36.9 C)] 98.5 F (36.9 C) (10/12 0641) Pulse Rate:  [75] 75 (10/12 0641) Resp:  [20] 20 (10/12 0641) BP: (147)/(67) 147/67 (10/12 0641) SpO2:  [98 %] 98 % (10/12 0641) Weight:  [123.8 kg] 123.8 kg (10/12 0607) Estimated body mass index is 46.84 kg/m as calculated from the following:   Height as of this encounter: 5\' 4"  (1.626 m).   Weight as of this encounter: 123.8 kg.   General Appearance: Alert, obese Head: Normocephalic, without obvious abnormality, atraumatic Eyes: PERRL, conjunctiva/corneas clear, EOM's intact,    Ears: Normal  Throat: Normal  Neck: Supple, her incision is well-healed.  She has limited range of motion. Back: unremarkable Lungs: Clear to auscultation  bilaterally, respirations unlabored Heart: Regular rate and rhythm, no murmur, rub or gallop Abdomen: Soft, non-tender Extremities: Extremities normal, atraumatic, no cyanosis or edema Skin: unremarkable  NEUROLOGIC:   Mental status: alert and oriented,Motor Exam - grossly normal Sensory Exam - grossly normal Reflexes:  Coordination - grossly normal Gait - grossly normal Balance - grossly normal Cranial Nerves: I: smell Not tested  II: visual acuity  OS: Normal  OD: Normal   II: visual fields Full to confrontation  II: pupils Equal, round, reactive to light  III,VII: ptosis None  III,IV,VI: extraocular muscles  Full ROM  V: mastication Normal  V: facial light touch sensation  Normal  V,VII: corneal reflex  Present  VII: facial muscle function - upper  Normal  VII: facial muscle function - lower Normal  VIII: hearing Not tested  IX: soft palate elevation  Normal  IX,X: gag reflex Present  XI: trapezius strength  5/5  XI: sternocleidomastoid strength 5/5  XI: neck flexion strength  5/5  XII: tongue strength  Normal    Data Review Lab Results  Component Value Date   WBC 5.8 10/11/2018   HGB 12.6 10/11/2018   HCT 39.3 10/11/2018   MCV 97.8 10/11/2018   PLT 293 10/11/2018   Lab Results  Component Value Date   NA 140 10/11/2018   K 4.1 10/11/2018   CL 107 10/11/2018   CO2 25 10/11/2018   BUN 16 10/11/2018   CREATININE 0.82 10/11/2018   GLUCOSE 107 (H) 10/11/2018   Lab Results  Component Value Date   INR 0.98 10/08/2013    Assessment/Plan: Lumbar and lumbosacral spondylolisthesis, facet arthropathy, lumbago, lumbar radiculopathy, neurogenic claudication: I have discussed the situation with the patient.  I have reviewed her imaging studies with her and pointed out the abnormalities.  We have discussed the various treatment options including surgery.  I have described the surgical treatment option of an L4-5 and L5-S1 decompression, instrumentation and fusion.  I  have shown her surgical models.  I have given her a surgical pamphlet.  We have discussed the risks, benefits, alternatives, expected postoperative course, and likelihood of achieving our goals with surgery.  I have answered all her questions.  She has decided to proceed with surgery.   Ophelia Charter 10/15/2018 7:23 AM

## 2018-10-15 NOTE — Anesthesia Procedure Notes (Addendum)
Procedure Name: Intubation Date/Time: 10/15/2018 7:40 AM Performed by: Jenne Campus, CRNA Pre-anesthesia Checklist: Patient identified, Emergency Drugs available, Suction available and Patient being monitored Patient Re-evaluated:Patient Re-evaluated prior to induction Oxygen Delivery Method: Circle System Utilized Preoxygenation: Pre-oxygenation with 100% oxygen Induction Type: IV induction and Cricoid Pressure applied Ventilation: Mask ventilation without difficulty Laryngoscope Size: Miller and 2 Grade View: Grade II Tube type: Oral Tube size: 7.5 mm Number of attempts: 1 Airway Equipment and Method: Stylet Placement Confirmation: ETT inserted through vocal cords under direct vision,  positive ETCO2 and breath sounds checked- equal and bilateral Secured at: 21 cm Tube secured with: Tape Dental Injury: Teeth and Oropharynx as per pre-operative assessment

## 2018-10-15 NOTE — Op Note (Signed)
Brief history: The patient is a 62 year old morbidly obese white female who has complained of intractable back pain.  She has failed medical management.  She was worked up with lumbar x-rays, lumbar MRI, lumbar myelo CT, etc.  This demonstrated L4-5 and L5-S1 facet arthropathy, degenerative disease, spondylolisthesis, etc.  I discussed the various treatment options with her.  She has decided proceed with surgery after weighing the risks, benefits and alternatives.  Preoperative diagnosis: L4-5 and L5-S1 facet arthropathy, spondylolisthesis, degenerative disc disease, spinal stenosis compressing L4, L5 and S1 nerve roots; lumbago; lumbar radiculopathy; neurogenic claudication  Postoperative diagnosis: The same  Procedure: Bilateral L4-5 and L5-S1 laminotomy/foraminotomies/medial facetectomy to decompress the bilateral L4, L5 and S1 nerve roots(the work required to do this was in addition to the work required to do the posterior lumbar interbody fusion because of the patient's spinal stenosis, facet arthropathy. Etc. requiring a wide decompression of the nerve roots.);  L4-5 and L5-S1 transforaminal lumbar interbody fusion with local morselized autograft bone and Zimmer DBM; insertion of interbody prosthesis at L4-5 and L5-S1 (globus peek expandable interbody prosthesis); posterior segmental instrumentation from L4 to S1 with globus titanium pedicle screws and rods; posterior lateral arthrodesis at L4-5 and L5-S1 with local morselized autograft bone and Zimmer DBM.  Surgeon: Dr. Earle Gell  Asst.: Dr. Granville Lewis  Anesthesia: Gen. endotracheal  Estimated blood loss: 300 cc  Drains: None  Complications: None  Description of procedure: The patient was brought to the operating room by the anesthesia team. General endotracheal anesthesia was induced. The patient was turned to the prone position on the Wilson frame. The patient's lumbosacral region was then prepared with Betadine scrub and Betadine  solution. Sterile drapes were applied.  I then injected the area to be incised with Marcaine with epinephrine solution. I then used the scalpel to make a linear midline incision over the L4-5 and L5-S1 interspace. I then used electrocautery to perform a bilateral subperiosteal dissection exposing the spinous process and lamina of L4, L5 and the upper sacrum. We then obtained intraoperative radiograph to confirm our location. We then inserted the Verstrac retractor to provide exposure.  I began the decompression by using the high speed drill to perform laminotomies at L4-5 and L5-S1 bilaterally. We then used the Kerrison punches to widen the laminotomy and removed the ligamentum flavum at L4-5 and L5-S1 bilaterally. We used the Kerrison punches to remove the medial facets at L4-5 and L5-S1 bilaterally.  We performed wide foraminotomies about the bilateral L4, L5 and S1 nerve roots completing the decompression.  We now turned our attention to the posterior lumbar interbody fusion. I used a scalpel to incise the intervertebral disc at L4-5 and L5-S1 bilaterally. I then performed a partial intervertebral discectomy at L4-5 and L5-S1 bilaterally using the pituitary forceps. We prepared the vertebral endplates at 075-GRM and 075-GRM bilaterally for the fusion by removing the soft tissues with the curettes. We then used the trial spacers to pick the appropriate sized interbody prosthesis. We prefilled his prosthesis with a combination of local morselized autograft bone that we obtained during the decompression as well as Zimmer DBM. We inserted the prefilled prosthesis into the interspace at L4-5 and L5-S1 from the right, we then turned and expanded the prosthesis. There was a good snug fit of the prosthesis in the interspace. We then filled and the remainder of the intervertebral disc space with local morselized autograft bone and Zimmer DBM. This completed the posterior lumbar interbody arthrodesis.  We now turned  attention to the instrumentation. Under fluoroscopic guidance we cannulated the bilateral L4, L5 and S1 pedicles with the bone probe. We then removed the bone probe. We then tapped the pedicle with a 6.5 millimeter tap. We then removed the tap. We probed inside the tapped pedicle with a ball probe to rule out cortical breaches. We then inserted a 7.5 x 40 and 50 millimeter pedicle screw into the L4, L5 and S1 pedicles bilaterally under fluoroscopic guidance. We then palpated along the medial aspect of the pedicles to rule out cortical breaches. There were none. The nerve roots were not injured. We then connected the unilateral pedicle screws with a lordotic rod. We compressed the construct and secured the rod in place with the caps. We then tightened the caps appropriately. This completed the instrumentation from L4-5 and L5-S1 bilaterally.  We now turned our attention to the posterior lateral arthrodesis at L4-5 and L5-S1 bilaterally. We used the high-speed drill to decorticate the remainder of the facets, pars, transverse process at L4-5 and L5-S1 bilaterally. We then applied a combination of local morselized autograft bone and Zimmer DBM over these decorticated posterior lateral structures. This completed the posterior lateral arthrodesis.  We then obtained hemostasis using bipolar electrocautery. We irrigated the wound out with bacitracin solution. We inspected the thecal sac and nerve roots and noted they were well decompressed. We then removed the retractor.  We injected Exparel . We reapproximated patient's thoracolumbar fascia with interrupted #1 Vicryl suture. We reapproximated patient's subcutaneous tissue with interrupted 2-0 Vicryl suture. The reapproximated patient's skin with Steri-Strips and benzoin. The wound was then coated with bacitracin ointment. A sterile dressing was applied. The drapes were removed. The patient was subsequently returned to the supine position where they were extubated by  the anesthesia team. He was then transported to the post anesthesia care unit in stable condition. All sponge instrument and needle counts were reportedly correct at the end of this case.

## 2018-10-15 NOTE — Anesthesia Postprocedure Evaluation (Signed)
Anesthesia Post Note  Patient: [name]  Procedure(s) Performed: POSTERIOR LUMBAR INTERBODY FUSION, POSTERIOR INSTRUMENTATION LUMBAR FOUR LUMBAR FIVE, LUMBAR FIVE- SACRAL ONE (N/A )     Patient location during evaluation: PACU Anesthesia Type: General Level of consciousness: awake and alert Pain management: pain level controlled Vital Signs Assessment: post-procedure vital signs reviewed and stable Respiratory status: spontaneous breathing, nonlabored ventilation, respiratory function stable and patient connected to nasal cannula oxygen Cardiovascular status: blood pressure returned to baseline and stable Postop Assessment: no apparent nausea or vomiting Anesthetic complications: no    Last Vitals:  Vitals:   10/15/18 1400 10/15/18 1431  BP: 131/71 130/60  Pulse:  75  Resp: 20 16  Temp:  36.7 C  SpO2: 93% 97%    Last Pain:  Vitals:   10/15/18 1435  TempSrc:   PainSc: [place]

## 2018-10-16 LAB — BASIC METABOLIC PANEL
Anion gap: 13 (ref 5–15)
BUN: 16 mg/dL (ref 8–23)
CO2: 20 mmol/L — ABNORMAL LOW (ref 22–32)
Calcium: 8.5 mg/dL — ABNORMAL LOW (ref 8.9–10.3)
Chloride: 106 mmol/L (ref 98–111)
Creatinine, Ser: 0.98 mg/dL (ref 0.44–1.00)
GFR calc Af Amer: >60 mL/min (ref >60)
GFR calc non Af Amer: >60 mL/min (ref >60)
Glucose, Bld: 157 mg/dL — ABNORMAL HIGH (ref 70–99)
Potassium: 4.1 mmol/L (ref 3.5–5.1)
Sodium: 139 mmol/L (ref 135–145)

## 2018-10-16 LAB — CBC
HCT: 33.2 % — ABNORMAL LOW (ref 36.0–46.0)
Hemoglobin: 11 g/dL — ABNORMAL LOW (ref 12.0–15.0)
MCH: 31.9 pg (ref 26.0–34.0)
MCHC: 33.1 g/dL (ref 30.0–36.0)
MCV: 96.2 fL (ref 80.0–100.0)
Platelets: 258 10*3/uL (ref 150–400)
RBC: 3.45 MIL/uL — ABNORMAL LOW (ref 3.87–5.11)
RDW: 13.6 % (ref 11.5–15.5)
WBC: 13.4 10*3/uL — ABNORMAL HIGH (ref 4.0–10.5)
nRBC: 0 % (ref 0.0–0.2)

## 2018-10-16 MED ORDER — DEXAMETHASONE 4 MG PO TABS
4.0000 mg | ORAL_TABLET | Freq: Four times a day (QID) | ORAL | Status: AC
  Administered 2018-10-16 (×2): 4 mg via ORAL
  Filled 2018-10-16 (×2): qty 1

## 2018-10-16 MED ORDER — DEXAMETHASONE 4 MG PO TABS
4.0000 mg | ORAL_TABLET | Freq: Four times a day (QID) | ORAL | Status: AC
  Administered 2018-10-16 (×2): 4 mg via ORAL
  Filled 2018-10-16 (×2): qty 1

## 2018-10-16 MED ORDER — CEFAZOLIN SODIUM-DEXTROSE 2-4 GM/100ML-% IV SOLN
2.0000 g | Freq: Once | INTRAVENOUS | Status: AC
  Administered 2018-10-16: 2 g via INTRAVENOUS
  Filled 2018-10-16: qty 100

## 2018-10-16 MED FILL — Sodium Chloride IV Soln 0.9%: INTRAVENOUS | Qty: 1000 | Status: AC

## 2018-10-16 MED FILL — Heparin Sodium (Porcine) Inj 1000 Unit/ML: INTRAMUSCULAR | Qty: 30 | Status: AC

## 2018-10-16 NOTE — Progress Notes (Signed)
Patient ID: [name], female   DOB: 1955-01-28, 63 y.o.   MRN: [id number] Subjective: The patient is alert and pleasant.  She says she gets a headache with the Dilaudid but it helps her with her back pain.  She is allergic to most other pain medications.  She complains of some leg aching.  Objective: Vital signs in last 24 hours: Temp:  [97.5 F (36.4 C)-98.7 F (37.1 C)] 97.7 F (36.5 C) (10/13 0733) Pulse Rate:  [73-102] 86 (10/13 0736) Resp:  [14-24] 16 (10/13 0733) BP: (95-150)/(44-87) 97/44 (10/13 0736) SpO2:  [93 %-100 %] 94 % (10/13 0733) Estimated body mass index is 46.84 kg/m as calculated from the following:   Height as of this encounter: 5\' 4"  (1.626 m).   Weight as of this encounter: 123.8 kg.   Intake/Output from previous day: 10/12 0701 - 10/13 0700 In: 2020 [P.O.:120; I.V.:1500; IV Piggyback:400] Out: 795 [Urine:545; Blood:250] Intake/Output this shift: No intake/output data recorded.  Physical exam the patient is alert and oriented.  She is moving her lower extremities well.  Her dressing is clean and dry.  Lab Results: Recent Labs    10/16/18 0558  WBC 13.4*  HGB 11.0*  HCT 33.2*  PLT 258   BMET Recent Labs    10/16/18 0558  NA 139  K 4.1  CL 106  CO2 20*  GLUCOSE 157*  BUN 16  CREATININE 0.98  CALCIUM 8.5*    Studies/Results: Dg Lumbar Spine 2-3 Views  Result Date: 10/15/2018 CLINICAL DATA:  Status post surgical posterior fusion of L4-5 and L5-S1. EXAM: LUMBAR SPINE - 2-3 VIEW; DG C-ARM 1-60 MIN FLUOROSCOPY TIME:  32 seconds COMPARISON:  June 13, 2018. FINDINGS: Two intraoperative fluoroscopic images were obtained of the lower lumbar spine. These demonstrate the patient be status post surgical posterior fusion of L4-5 and L5-S1 with bilateral intrapedicular screw placement and interbody fusion. Good alignment of vertebral bodies is noted. IMPRESSION: Fluoroscopic guidance provided during surgical posterior fusion of L4-5 and L5-S1.  Electronically Signed   By: Marijo Conception M.D.   On: 10/15/2018 13:27   Dg Lumbar Spine 1 View  Result Date: 10/15/2018 CLINICAL DATA:  Localization EXAM: LUMBAR SPINE - 1 VIEW COMPARISON:  06/19/2018 FINDINGS: Posterior surgical instruments are directed at the L4-5 level. IMPRESSION: Intraoperative localization as above. Electronically Signed   By: Rolm Baptise M.D.   On: 10/15/2018 12:20   Dg C-arm 1-60 Min  Result Date: 10/15/2018 CLINICAL DATA:  Status post surgical posterior fusion of L4-5 and L5-S1. EXAM: LUMBAR SPINE - 2-3 VIEW; DG C-ARM 1-60 MIN FLUOROSCOPY TIME:  32 seconds COMPARISON:  June 13, 2018. FINDINGS: Two intraoperative fluoroscopic images were obtained of the lower lumbar spine. These demonstrate the patient be status post surgical posterior fusion of L4-5 and L5-S1 with bilateral intrapedicular screw placement and interbody fusion. Good alignment of vertebral bodies is noted. IMPRESSION: Fluoroscopic guidance provided during surgical posterior fusion of L4-5 and L5-S1. Electronically Signed   By: Marijo Conception M.D.   On: 10/15/2018 13:27    Assessment/Plan: Postop day #1: The patient is doing well.  I will add a couple doses of Decadron for her leg aching.  We will mobilize her with PT and OT.  She may go home later on today.  I gave her her discharge instructions and answered all her questions.  LOS: 1 day     Ophelia Charter 10/16/2018, 7:44 AM

## 2018-10-16 NOTE — Evaluation (Signed)
Physical Therapy Evaluation Patient Details Name: [name] MRN: [id number] DOB: 08/09/1955 Today's Date: 10/16/2018   History of Present Illness  63 yo s/p L4-5; S1 PLIF. PMH" Morbid obesity; mulitiple B knee surgeries; arthritis; peripheral neuropathy.  Clinical Impression  Pt admitted with above diagnosis. At the time of PT eval, pt was able to demonstrate transfers and ambulation with gross supervision for safety. Min assist required for LE's back up onto bed at end of session and for ambulation without an AD. Pt was educated on precautions, brace application/wearing schedule, appropriate activity progression, and car transfer. Pt currently with functional limitations due to the deficits listed below (see PT Problem List). Pt will benefit from skilled PT to increase their independence and safety with mobility to allow discharge to the venue listed below.    Follow Up Recommendations No PT follow up;Supervision for mobility/OOB    Equipment Recommendations  Rolling walker with 5" wheels    Recommendations for Other Services       Precautions / Restrictions Precautions Precautions: Back Precaution Booklet Issued: Yes (comment) Required Braces or Orthoses: Spinal Brace Spinal Brace: Lumbar corset;Applied in sitting position Restrictions Weight Bearing Restrictions: No      Mobility  Bed Mobility Overal bed mobility: Needs Assistance Bed Mobility: Sidelying to Sit;Sit to Sidelying   Sidelying to sit: Supervision     Sit to sidelying: Min assist General bed mobility comments: HOB slightly elevated. Pt was able to transition to EOB without assistance however required min assist at end of session to elevate LE's back up onto bed.   Transfers Overall transfer level: Needs assistance Equipment used: Rolling walker (2 wheeled) Transfers: Sit to/from Stand Sit to Stand: Supervision         General transfer comment: Supervision for safety however pt did not  require any physical assist. VC's for hand placement on seated surface for safety.   Ambulation/Gait Ambulation/Gait assistance: Supervision Gait Distance (Feet): 200 Feet Assistive device: Rolling walker (2 wheeled) Gait Pattern/deviations: Step-through pattern;Decreased stride length Gait velocity: Decreased Gait velocity interpretation: <1.8 ft/sec, indicate of risk for recurrent falls General Gait Details: VC's for improved posture, closer walker proximity, and forward gaze. Pt guarded but overall appears comfortable ambulating with RW. Pt did attempt ambulation with HHA only at end of session and was very unsteady with min assist required from therapist. Pt agrees RW is better choice at this time for safety.   Stairs            Wheelchair Mobility    Modified Rankin (Stroke Patients Only)       Balance Overall balance assessment: No apparent balance deficits (not formally assessed)                                           Pertinent Vitals/Pain Pain Assessment: 0-10 Pain Score: 4  Pain Location: Headache Pain Descriptors / Indicators: Aching;Headache Pain Intervention(s): Limited activity within patient's tolerance;Monitored during session;Repositioned    Home Living Family/patient expects to be discharged to:: Private residence Living Arrangements: Spouse/significant other Available Help at Discharge: Available 24 hours/day Type of Home: House Home Access: Level entry     Home Layout: One level Home Equipment: Hand held shower head      Prior Function Level of Independence: Independent               Hand Dominance   Dominant Hand:  Right    Extremity/Trunk Assessment   Upper Extremity Assessment Upper Extremity Assessment: Overall WFL for tasks assessed    Lower Extremity Assessment Lower Extremity Assessment: Generalized weakness(Consistent with pre-op diagnosis)    Cervical / Trunk Assessment Cervical / Trunk  Assessment: Other exceptions(back surgery)  Communication   Communication: No difficulties  Cognition Arousal/Alertness: Awake/alert Behavior During Therapy: WFL for tasks assessed/performed Overall Cognitive Status: Within Functional Limits for tasks assessed                                        General Comments      Exercises     Assessment/Plan    PT Assessment Patient needs continued PT services  PT Problem List Decreased strength;Decreased activity tolerance;Decreased balance;Decreased mobility;Decreased knowledge of use of DME;Decreased safety awareness;Decreased knowledge of precautions;Pain       PT Treatment Interventions DME instruction;Gait training;Functional mobility training;Therapeutic exercise;Therapeutic activities;Neuromuscular re-education;Patient/family education    PT Goals (Current goals can be found in the Care Plan section)  Acute Rehab PT Goals Patient Stated Goal: to go home PT Goal Formulation: With patient Time For Goal Achievement: 10/23/18 Potential to Achieve Goals: Good    Frequency Min 5X/week   Barriers to discharge        Co-evaluation               AM-PAC PT "6 Clicks" Mobility  Outcome Measure Help needed turning from your back to your side while in a flat bed without using bedrails?: None Help needed moving from lying on your back to sitting on the side of a flat bed without using bedrails?: None Help needed moving to and from a bed to a chair (including a wheelchair)?: A Little Help needed standing up from a chair using your arms (e.g., wheelchair or bedside chair)?: A Little Help needed to walk in hospital room?: A Little Help needed climbing 3-5 steps with a railing? : A Little 6 Click Score: 20    End of Session Equipment Utilized During Treatment: Gait belt Activity Tolerance: Patient tolerated treatment well Patient left: in bed;with call bell/phone within reach Nurse Communication: Mobility  status PT Visit Diagnosis: Unsteadiness on feet (R26.81);Pain Pain - part of body: (back, headache)    Time: [id number] PT Time Calculation (min) (ACUTE ONLY): 29 min   Charges:   PT Evaluation $PT Eval Moderate Complexity: 1 Mod PT Treatments $Gait Training: 8-22 mins        Rolinda Roan, PT, DPT Acute Rehabilitation Services Pager: [phone] Office: [phone]   Thelma Comp 10/16/2018, 11:00 AM

## 2018-10-16 NOTE — Progress Notes (Signed)
Occupational Therapy Evaluation Patient Details Name: [name] MRN: [id number] DOB: 1955/11/23 Today's Date: 10/16/2018    History of Present Illness 63 yo s/p L4-5; S1 PLIF. PMH" Morbid obesity; mulitiple B knee surgeries; arthritis; peripheral neuropathy.   Clinical Impression   Completed all education regarding AE, DME and compensatory strategies for ADL. Pt able to return demonstrate correct techniques after education. Pt will need to use AE due to limited knee ROM and body habitus. Pt states she feels comfortable discharging home - will notify nurse. No further OT needs.     Follow Up Recommendations  No OT follow up;Supervision - Intermittent    Equipment Recommendations  3 in 1 bedside commode    Recommendations for Other Services       Precautions / Restrictions Precautions Precautions: Back Precaution Booklet Issued: Yes (comment) Required Braces or Orthoses: Spinal Brace Spinal Brace: Lumbar corset;Applied in sitting position      Mobility Bed Mobility Overal bed mobility: Needs Assistance Bed Mobility: Sidelying to Sit;Sit to Sidelying   Sidelying to sit: Supervision     Sit to sidelying: Min guard General bed mobility comments: Educated on technique; Able to return demonstrate  Transfers Overall transfer level: Needs assistance Equipment used: Rolling walker (2 wheeled) Transfers: Sit to/from Stand Sit to Stand: Supervision              Balance Overall balance assessment: No apparent balance deficits (not formally assessed)                                         ADL either performed or assessed with clinical judgement   ADL Overall ADL's : Needs assistance/impaired                                     Functional mobility during ADLs: Supervision/safety;Rolling walker;Cueing for safety General ADL Comments: Completed education regarding useof AE for LB ADL with use of AE; Pt able to return  demsontrate with use of reacher and sock aid. Educated on use of long handled shoe horn; long handled sponge. Educated on [employer] with use of toilet tong; Educated on safe tub trasnfer techniques. Husband present for education and verbalized how to obtain AE. Pt would benefit form use of 3in1 as showerseat.      Vision         Perception     Praxis      Pertinent Vitals/Pain Pain Assessment: 0-10 Pain Score: 4  Pain Location: Headache Pain Descriptors / Indicators: Aching;Headache Pain Intervention(s): Limited activity within patient's tolerance;Repositioned     Hand Dominance Right   Extremity/Trunk Assessment Upper Extremity Assessment Upper Extremity Assessment: Overall WFL for tasks assessed   Lower Extremity Assessment Lower Extremity Assessment: Defer to PT evaluation   Cervical / Trunk Assessment Cervical / Trunk Assessment: Other exceptions(back surgery)   Communication     Cognition Arousal/Alertness: Awake/alert Behavior During Therapy: WFL for tasks assessed/performed Overall Cognitive Status: Within Functional Limits for tasks assessed                                     General Comments       Exercises     Shoulder Instructions      Home Living Family/patient  expects to be discharged to:: Private residence Living Arrangements: Spouse/significant other Available Help at Discharge: Available 24 hours/day Type of Home: House             Bathroom Shower/Tub: Tub/shower unit;Curtain   Bathroom Toilet: Handicapped height Bathroom Accessibility: Yes How Accessible: Accessible via walker Home Equipment: Hand held shower head          Prior Functioning/Environment Level of Independence: Independent                 OT Problem List: Decreased strength;Decreased knowledge of use of DME or AE;Decreased knowledge of precautions;Obesity;Pain      OT Treatment/Interventions:      OT Goals(Current goals can be found in the  care plan section) Acute Rehab OT Goals Patient Stated Goal: to go home OT Goal Formulation: All assessment and education complete, DC therapy  OT Frequency:     Barriers to D/C:            Co-evaluation              AM-PAC OT "6 Clicks" Daily Activity     Outcome Measure Help from another person eating meals?: None Help from another person taking care of personal grooming?: A Little Help from another person toileting, which includes using toliet, bedpan, or urinal?: A Little Help from another person bathing (including washing, rinsing, drying)?: A Little Help from another person to put on and taking off regular upper body clothing?: A Little Help from another person to put on and taking off regular lower body clothing?: A Little 6 Click Score: 19   End of Session Equipment Utilized During Treatment: Rolling walker;Back brace Nurse Communication: Mobility status;Other (comment)(pt states she is ready for DC)  Activity Tolerance: Patient tolerated treatment well Patient left: in bed;with call bell/phone within reach;with family/visitor present  OT Visit Diagnosis: Unsteadiness on feet (R26.81);Muscle weakness (generalized) (M62.81);Pain Pain - part of body: (headache)                Time: 1000-1030 OT Time Calculation (min): 30 min Charges:  OT General Charges $OT Visit: 1 Visit OT Evaluation $OT Eval Low Complexity: 1 Low OT Treatments $Self Care/Home Management : 8-22 mins  Maurie Boettcher, OT/L   Acute OT Clinical Specialist Acute Rehabilitation Services Pager [phone] Office [phone]   [facility] 10/16/2018, 10:48 AM

## 2018-10-16 NOTE — Progress Notes (Signed)
OT Cancellation Note  Patient Details Name: [name] MRN: [id number] DOB: May 24, 1955   Cancelled Treatment:    Reason Eval/Treat Not Completed: Other (comment) Pt with a headache and requesting therapist to return later.   Ramond Dial, OT/L   Acute OT Clinical Specialist Acute Rehabilitation Services Pager [phone] Office [phone]  10/16/2018, 9:26 AM

## 2018-10-17 NOTE — Progress Notes (Signed)
Discharged instructions/education/AVS/Rx given to patient with husband at bedside and they both verbalized understanding. PAin is mild to moderate and tolerable per patient and controlled by PRN meds. Ambulating well with walker, MAE. No swelling, no drainage, no redness noted on incision site. Voiding and emptying bladder well. Discharged via wheelchair.

## 2018-10-17 NOTE — Progress Notes (Signed)
Physical Therapy Treatment Patient Details Name: [name] MRN: [id number] DOB: Mar 16, 1955 Today's Date: 10/17/2018    History of Present Illness 63 yo s/p L4-5; S1 PLIF. PMH" Morbid obesity; mulitiple B knee surgeries; arthritis; peripheral neuropathy.    PT Comments    Pt progressing well with post-op mobility. She was able to demonstrate transfers and ambulation with gross supervision for safety with RW for support. Pt reports improvement in headache however endorses increased muscle spasms in back. Reinforced education on precautions, brace application/wearing schedule, appropriate activity progression, and car transfer. Will continue to follow.     Follow Up Recommendations  No PT follow up;Supervision for mobility/OOB     Equipment Recommendations  Rolling walker with 5" wheels    Recommendations for Other Services       Precautions / Restrictions Precautions Precautions: Back Precaution Booklet Issued: Yes (comment) Required Braces or Orthoses: Spinal Brace Spinal Brace: Lumbar corset;Applied in sitting position Restrictions Weight Bearing Restrictions: No    Mobility  Bed Mobility Overal bed mobility: Needs Assistance Bed Mobility: Sidelying to Sit;Sit to Sidelying   Sidelying to sit: Supervision     Sit to sidelying: Supervision General bed mobility comments: Pt was able to transition to/from EOB without assistance. HOB flat and rails lowered to simulate home environment. Increased time.   Transfers Overall transfer level: Needs assistance Equipment used: Rolling walker (2 wheeled) Transfers: Sit to/from Stand Sit to Stand: Supervision         General transfer comment: Supervision for safety however pt did not require any physical assist. VC's for hand placement on seated surface for safety.   Ambulation/Gait Ambulation/Gait assistance: Supervision Gait Distance (Feet): 200 Feet Assistive device: Rolling walker (2 wheeled) Gait  Pattern/deviations: Step-through pattern;Decreased stride length Gait velocity: Decreased Gait velocity interpretation: <1.8 ft/sec, indicate of risk for recurrent falls General Gait Details: VC's for improved posture, closer walker proximity, and forward gaze. Pt guarded but overall appears comfortable ambulating with RW.   Stairs             Wheelchair Mobility    Modified Rankin (Stroke Patients Only)       Balance Overall balance assessment: No apparent balance deficits (not formally assessed)                                          Cognition Arousal/Alertness: Awake/alert Behavior During Therapy: WFL for tasks assessed/performed Overall Cognitive Status: Within Functional Limits for tasks assessed                                        Exercises      General Comments        Pertinent Vitals/Pain Pain Assessment: Faces Faces Pain Scale: Hurts a little bit Pain Location: Headache, muscle spasm in back Pain Descriptors / Indicators: Headache;Spasm Pain Intervention(s): Limited activity within patient's tolerance;Monitored during session;Repositioned    Home Living                      Prior Function            PT Goals (current goals can now be found in the care plan section) Acute Rehab PT Goals Patient Stated Goal: to go home PT Goal Formulation: With patient Time For Goal Achievement: 10/23/18 Potential to  Achieve Goals: Good Progress towards PT goals: Progressing toward goals    Frequency    Min 5X/week      PT Plan Current plan remains appropriate    Co-evaluation              AM-PAC PT "6 Clicks" Mobility   Outcome Measure  Help needed turning from your back to your side while in a flat bed without using bedrails?: None Help needed moving from lying on your back to sitting on the side of a flat bed without using bedrails?: None Help needed moving to and from a bed to a chair  (including a wheelchair)?: A Little Help needed standing up from a chair using your arms (e.g., wheelchair or bedside chair)?: A Little Help needed to walk in hospital room?: A Little Help needed climbing 3-5 steps with a railing? : A Little 6 Click Score: 20    End of Session Equipment Utilized During Treatment: Gait belt Activity Tolerance: Patient tolerated treatment well Patient left: in bed;with call bell/phone within reach Nurse Communication: Mobility status PT Visit Diagnosis: Unsteadiness on feet (R26.81);Pain Pain - part of body: (back, headache)     Time: 0812-0825 PT Time Calculation (min) (ACUTE ONLY): 13 min  Charges:  $Gait Training: 8-22 mins                     Rolinda Roan, PT, DPT Acute Rehabilitation Services Pager: [phone] Office: [phone]    Thelma Comp 10/17/2018, 8:38 AM

## 2018-10-17 NOTE — Discharge Summary (Signed)
Physician Discharge Summary  Patient ID: [name] MRN: [id number] DOB/AGE: 63/08/1955 63 y.o. Estimated body mass index is 46.84 kg/m as calculated from the following:   Height as of this encounter: 5\' 4"  (1.626 m).   Weight as of this encounter: 123.8 kg.   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  Postoperatively patient did fairly well and covering the floor on the floor was ambulating and voiding spontaneously pain came under better control was mobilized with physical therapy and is stable for discharge home on postop day 2.  Patient will be discharged with scheduled follow-up in 1 to 2 weeks.  Consults: Significant Diagnostic Studies: Treatments: Posterior lumbar interbody fusion L4-5 L5-S1 Discharge Exam: Blood pressure (!) 139/58, pulse 83, temperature 99 F (37.2 C), temperature source Oral, resp. rate 16, height 5\' 4"  (1.626 m), weight 123.8 kg, SpO2 98 %. Strength 5-5 wound clean dry and intact  Disposition: Home   Allergies as of 10/17/2018      Reactions   Adhesive [tape] Other (See Comments)   Blisters with tape after surgical procedures   Morphine And Related Other (See Comments)   Feels like bugs crawling over her   Nsaids Hives   Buspar [buspirone] Palpitations   Ciprofloxacin Nausea Only   Upset stomach   Codeine Other (See Comments)   Hyper   Gabapentin Other (See Comments)   Suicidal ideation.   Lodine [etodolac] Nausea Only   Upset Stomach   Lyrica [pregabalin] Other (See Comments)   suicidal ideation   Niacin And Related Other (See Comments)   flushing   Pravastatin Other (See Comments)   achiness   Septra  [sulfamethoxazole-trimethoprim] Other (See Comments)   Stomach pains   Tylox [oxycodone-acetaminophen] Other (See Comments)   Hyper   Wellbutrin [bupropion] Other (See Comments)   dizzy      Medication List    TAKE these medications   acetaminophen 500 MG tablet Commonly known as: TYLENOL Take 1,000 mg by mouth every 6 (six) hours as needed (pain.).   ALPRAZolam 0.5 MG tablet Commonly known as: XANAX Take 0.5 tablets (0.25 mg total) by mouth 3 (three) times daily as needed for anxiety.   amoxicillin 500 MG capsule Commonly known as: AMOXIL Take 2,000 mg by mouth See admin instructions. Take 4 capsules (2000 mg) by mouth 1 hour prior to dental procedure.   Azelaic Acid 15 % cream Apply 1 application topically 2 (two) times daily. Facial Gel   b complex vitamins capsule Take 1 capsule by mouth daily.   ezetimibe 10 MG tablet Commonly known as: ZETIA Take 10 mg by mouth daily.   fexofenadine 180 MG tablet Commonly known as: ALLEGRA Take 180 mg by mouth daily as needed for allergies.   FLINTSTONES COMPLETE PO Take 1 tablet by mouth 2 (two) times daily.   fluticasone 50 MCG/ACT nasal spray Commonly known as: FLONASE Place 1-2 sprays into both nostrils daily as needed for allergies or rhinitis.   HYDROmorphone 2 MG tablet Commonly known as: DILAUDID Take 2-4 mg by mouth every 6 (six) hours as needed for severe pain (pain.).   methocarbamol 750 MG tablet Commonly known as: ROBAXIN Take 750 mg by mouth every 6 (six) hours as needed for muscle spasms.   NON  FORMULARY Apply 1 application topically as needed (pain.). Deep Blue essential oil blend   OIL BASE EX Apply 1 application topically as needed (pain.). [employer] Deep Relief   oxyCODONE-acetaminophen 5-325 MG tablet Commonly known as: PERCOCET/ROXICET Take 1 tablet by mouth every 6 (six) hours as needed for severe pain (pain.).   PARoxetine 20 MG tablet Commonly known as: PAXIL 1 tablet twice a day and 1  tablet at bedtime What changed:   how much to take  how to take this  when to take this  additional instructions   peppermint oil liquid Apply 1 application topically as needed (pain.). Peppermint essential oil   pramipexole 0.25 MG tablet Commonly known as: MIRAPEX 1 in the morning and 1 1/2 tablets at night What changed:   how much to take  how to take this  when to take this  additional instructions   propranolol ER 60 MG 24 hr capsule Commonly known as: INDERAL LA Take 1 capsule (60 mg total) by mouth daily.   Synthroid 200 MCG tablet Generic drug: levothyroxine Take 200 mcg by mouth daily before breakfast.   Vitamin D-3 125 MCG (5000 UT) Tabs Take 5,000 Units by mouth daily.   Zinc 50 MG Tabs Take 25 mg by mouth daily.            Durable Medical Equipment  (From admission, onward)         Start     Ordered   10/16/18 1119  For home use only DME 3 n 1  Once     10/16/18 1118   10/16/18 0955  For home use only DME Walker  Once    Question:  Patient needs a walker to treat with the following condition  Answer:  S/P lumbar spinal fusion   10/16/18 0954           Signed: Kadarious Dikes P 10/17/2018, 3:54 PM

## 2018-10-20 HISTORY — PX: BACK SURGERY: SHX140

## 2018-10-31 ENCOUNTER — Telehealth: Payer: Self-pay | Admitting: Diagnostic Neuroimaging

## 2018-10-31 NOTE — Telephone Encounter (Signed)
Santiago Glad from [employer] called stating that she will be faxing over some information on this pt and she is needing the office to hold on to the confirmation sheet that prints out. Please advise.

## 2018-11-15 ENCOUNTER — Ambulatory Visit: Payer: Medicare Other | Admitting: Psychiatry

## 2018-12-20 ENCOUNTER — Other Ambulatory Visit: Payer: Self-pay | Admitting: Family Medicine

## 2018-12-20 DIAGNOSIS — Z1231 Encounter for screening mammogram for malignant neoplasm of breast: Secondary | ICD-10-CM

## 2019-01-11 ENCOUNTER — Other Ambulatory Visit: Payer: Self-pay | Admitting: Psychiatry

## 2019-01-11 DIAGNOSIS — G2581 Restless legs syndrome: Secondary | ICD-10-CM

## 2019-01-23 ENCOUNTER — Ambulatory Visit: Payer: Medicare Other | Admitting: Psychiatry

## 2019-01-31 ENCOUNTER — Other Ambulatory Visit: Payer: Self-pay | Admitting: Psychiatry

## 2019-02-01 NOTE — Telephone Encounter (Signed)
Need to clarify her dose

## 2019-02-04 NOTE — Telephone Encounter (Signed)
Pt. States she takes it 1 in the morning and  1  in the evening and  1 at bedtime.

## 2019-02-04 NOTE — Telephone Encounter (Signed)
Thank you, will submit refill

## 2019-02-04 NOTE — Telephone Encounter (Signed)
Can you confirm her dose of Paroxetine

## 2019-02-06 ENCOUNTER — Ambulatory Visit (INDEPENDENT_AMBULATORY_CARE_PROVIDER_SITE_OTHER): Payer: Medicare PPO | Admitting: Psychiatry

## 2019-02-06 DIAGNOSIS — F331 Major depressive disorder, recurrent, moderate: Secondary | ICD-10-CM

## 2019-02-06 DIAGNOSIS — F341 Dysthymic disorder: Secondary | ICD-10-CM | POA: Diagnosis not present

## 2019-02-06 DIAGNOSIS — Z9889 Other specified postprocedural states: Secondary | ICD-10-CM

## 2019-02-06 DIAGNOSIS — M546 Pain in thoracic spine: Secondary | ICD-10-CM | POA: Diagnosis not present

## 2019-02-06 DIAGNOSIS — F429 Obsessive-compulsive disorder, unspecified: Secondary | ICD-10-CM | POA: Diagnosis not present

## 2019-02-06 DIAGNOSIS — F4010 Social phobia, unspecified: Secondary | ICD-10-CM | POA: Diagnosis not present

## 2019-02-06 NOTE — Progress Notes (Signed)
Psychotherapy Progress Note [facility] [name], PhD LP  Patient ID: [name]     MRN: [id number] Therapy format: Individual psychotherapy Date: 02/06/2019      Start: 3:10p     Stop: 4:00p     Time Spent: 50 min Location: Telehealth visit -- I connected with this patient by an approved telecommunication method (audio only), with her informed consent, and verifying identity and patient privacy.  I was located at my office and patient at her home.  As needed, we discussed the limitations, risks, and security and privacy concerns associated with telehealth service, including the availability and conditions which currently govern in-person appointments and the possibility that 3rd-party payment may not be fully guaranteed and she may be responsible for charges.  After she indicated understanding, we proceeded with the session.  Also discussed treatment planning, as needed, including ongoing verbal agreement with the plan, the opportunity to ask and answer all questions, her demonstrated understanding of instructions, and her readiness to call the office should symptoms worsen or she feels she is in a crisis state and needs more immediate and tangible assistance.   Session narrative (presenting needs, interim history, self-report of stressors and symptoms, applications of prior therapy, status changes, and interventions made in session) Began video link 3:05pm, not picked up.  TC 3:10pm, reached voicemail.  PT joined shortly after, making a phone connection rather than video.  Disconnected and retried, connected through laptop (probably not installed on phone).    Hardship being limited in movement from back surgery, which has been fertile ground for obsessing over things.  Has been over her notes trying to cope.  Social and political developments have prompted friends to declare that the [name] is coming back soon, then they wonder why she isn't happy (the idea sparks up  her OCD salvation fears).  Says she gets angry watching the news and is just about addicted to looking for what else is going to go wrong, or become an injustice in the world, and suggests she is afraid she will become too angry.  Defined anger as the energy to protect something precious -- the key is to figure out what is so "precious" and what reasonably she can do.  Outlined an approach to managing her reactions by asking herself several reality questions -- (1) What's going on actually, (2) How bad is it actually, (3) What can I do about it actually, and (4) What might someone else be doing about it actually?    Transmission cut 30 min in (battery), rejoined after getting power cord.  Repeatedly apologetic.    Has written to congressman about her take on certain issues.  Affirmed her participating in democracy and addressed concern whether she should write another letter.  Renewed thinking, sometimes obsession, about God and trust and how she can tell if she's "really" trusting.  Noted OCD's "game" to get her embroiled in "really" questions about her perceived faith duties and reflected back the idea that God knows her heart and closes any distance left by not feeling trust.  Has gotten idea from daughter (fellow PT) to take [place] as a boost for conscious control of OCD.  OK, consult Dr. Clovis Pu if needed.  Therapeutic modalities: Cognitive Behavioral Therapy and Solution-Oriented/Positive Psychology  Mental Status/Observations:  Appearance:   Casual     Behavior:  Appropriate  Motor:  Normal and except as limited by pain  Speech/Language:   Clear and Coherent  Affect:  Appropriate  Mood:  anxious and dysthymic  Thought process:  circumstantial  Thought content:    Obsessions  Sensory/Perceptual disturbances:    WNL  Orientation:  Fully oriented  Attention:  Fair  Concentration:  Fair  Memory:  grossly intact  Insight:    Fair  Judgment:   Fair  Impulse Control:  Good   Risk  Assessment: Danger to Self: No Self-injurious Behavior: No Danger to Others: No Physical Aggression / Violence: No Duty to Warn: No Access to Firearms a concern: No  Assessment of progress:  stabilized  Diagnosis:   ICD-10-CM   1. Obsessive-compulsive disorder, unspecified type  F42.9   2. 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 Right-sided thoracic back pain, unspecified chronicity  M54.6   5. Major depressive disorder, recurrent episode, moderate (HCC)  F33.1   6. History of general anesthesia  Z98.890    Plan:  . Endorsed NAC supplementation, provided no headache or interaction with meds . Supported B complex, D3, idea to add magnesium . Get doctor's advice about combining Xanax and muscle relaxant . Other recommendations/advice as may be noted above . Continue to utilize previously learned skills ad lib . Maintain medication as prescribed and work faithfully with relevant prescriber(s) if any changes are desired or seem indicated . Call the clinic on-call service, present to ER, or call 911 if any life-threatening psychiatric crisis . Return 2-4 wks..  Next scheduled visit in this office 02/15/2019.  [name], PhD [name], PhD LP Clinical Psychologist, [facility] [address], [place], [place] [address] [phone]

## 2019-02-08 ENCOUNTER — Ambulatory Visit: Payer: Medicare Other

## 2019-02-15 ENCOUNTER — Ambulatory Visit (INDEPENDENT_AMBULATORY_CARE_PROVIDER_SITE_OTHER): Payer: Medicare PPO | Admitting: Psychiatry

## 2019-02-15 ENCOUNTER — Encounter: Payer: Self-pay | Admitting: Psychiatry

## 2019-02-15 DIAGNOSIS — F4010 Social phobia, unspecified: Secondary | ICD-10-CM | POA: Diagnosis not present

## 2019-02-15 DIAGNOSIS — F3340 Major depressive disorder, recurrent, in remission, unspecified: Secondary | ICD-10-CM | POA: Diagnosis not present

## 2019-02-15 DIAGNOSIS — F429 Obsessive-compulsive disorder, unspecified: Secondary | ICD-10-CM | POA: Diagnosis not present

## 2019-02-15 DIAGNOSIS — F411 Generalized anxiety disorder: Secondary | ICD-10-CM

## 2019-02-15 DIAGNOSIS — G2581 Restless legs syndrome: Secondary | ICD-10-CM

## 2019-02-15 DIAGNOSIS — F341 Dysthymic disorder: Secondary | ICD-10-CM

## 2019-02-15 DIAGNOSIS — F5089 Other specified eating disorder: Secondary | ICD-10-CM

## 2019-02-15 MED ORDER — ALPRAZOLAM 0.5 MG PO TABS: 0.2500 mg | ORAL_TABLET | Freq: Three times a day (TID) | ORAL | 3 refills | Status: DC | PRN

## 2019-02-15 MED ORDER — PAROXETINE HCL 20 MG PO TABS: 20.0000 mg | ORAL_TABLET | Freq: Three times a day (TID) | ORAL | 1 refills | Status: DC

## 2019-02-15 MED ORDER — PROPRANOLOL HCL ER 60 MG PO CP24: 60.0000 mg | ORAL_CAPSULE | Freq: Every day | ORAL | 1 refills | Status: DC

## 2019-02-15 MED ORDER — PRAMIPEXOLE DIHYDROCHLORIDE 0.25 MG PO TABS: ORAL_TABLET | ORAL | 0 refills | Status: DC

## 2019-02-15 NOTE — Progress Notes (Signed)
[name] [id number] [birth year] 64 y.o.  Virtual Visit via [place]  I connected with pt by WebEx and verified that I am speaking with the correct person using two identifiers.   I discussed the limitations, risks, security and privacy concerns of performing an evaluation and management service by [name] and the availability of in person appointments. I also discussed with the patient that there may be a patient responsible charge related to this service. The patient expressed understanding and agreed to proceed.  I discussed the assessment and treatment plan with the patient. The patient was provided an opportunity to ask questions and all were answered. The patient agreed with the plan and demonstrated an understanding of the instructions.   The patient was advised to call back or seek an in-person evaluation if the symptoms worsen or if the condition fails to improve as anticipated.  I provided 30 minutes of video time during this encounter. The call started at 1100 and ended at 11:30. The patient was located at home and the provider was located office.   Subjective:   Patient ID:  [name] is a 64 y.o. (DOB 03-09-1955) female.  Chief Complaint:  Chief Complaint  Patient presents with  . Anxiety    OCD  . Depression  . Follow-up    HPI [name] presents for follow-up of treatment resistant OCD, major depression, and restless leg syndrome.  seen May 2019.  She had accidentally reduced her paroxetine from 60 mg to 40 mg and it was increased back to the appropriate 60 mg dose.  She never increased the dosage.  Last seen May 2020.  She continued on paroxetine 40 mg a day.  Her anxiety level was high.  We discussed increasing it back to 60 mg a day which she had taken in the past.  She indicated a willingness to do so.  This had been discussed at the previous appointment as well but she never made the change.   As of Feb 2021, more anxiety.   September fusions back surgery.  Created lots of anxiety.   More scary thoughts and more OCD.  Covid triggers obsessive fears about the end of times biblically.   Didn't increase paroxetine bc afraid she will lose options for future fear of worsening options.  "I'm saving it."   More down over the back surgery and immobility.  Depression was OK otherwise.  Been doing pretty good for the most part, but more anxious lately.  Nervous facing knee and possible back surgery.  Also working on obsessions with [name].  She wonders about increasing the Paxil.  She never increased the Paxil to 60 mg daily.  Disc pros and cons of this.  Needing more Xanax but it's not a high dosage..  Patient reports stable mood and denies depressed or irritable moods.  She has a history of severe depression but that is under control.  Patient denies difficulty with sleep initiation or maintenance. Denies appetite disturbance.  Patient reports that energy and motivation have been good.  Patient denies any difficulty with concentration.  Patient denies any suicidal ideation.  Past Psychiatric Medication Trials: Paroxetine, venlafaxine, clomipramine, Lexapro 40 no response, sertraline, duloxetine, fluvoxamine,  Wellbutrin 300 panic, Viibryd, Fetzima,  Abilify, Risperdal, Geodon 40 twice daily side effects, Seroquel, Latuda, Rexulti, Saphris,  N-acetylcysteine, Deplin, clonidine, pindolol, gabapentin SI, lamotrigine, buspirone, pramipexole, Xanax  Review of Systems:  Review of Systems  Musculoskeletal: Positive for back pain, gait problem and joint swelling.  Neurological: Negative for tremors and weakness.    Medications: I have reviewed the patient's current medications.  Current Outpatient Medications  Medication Sig Dispense Refill  . acetaminophen (TYLENOL) 500 MG tablet Take 1,000 mg by mouth every 6 (six) hours as needed (pain.).    Marland Kitchen ALPRAZolam (XANAX) 0.5 MG tablet Take 0.5 tablets (0.25 mg total) by mouth 3  (three) times daily as needed for anxiety. 90 tablet 3  . amoxicillin (AMOXIL) 500 MG capsule Take 2,000 mg by mouth See admin instructions. Take 4 capsules (2000 mg) by mouth 1 hour prior to dental procedure.    . Azelaic Acid 15 % cream Apply 1 application topically 2 (two) times daily. Facial Gel    . b complex vitamins capsule Take 1 capsule by mouth daily.    . Cholecalciferol (VITAMIN D-3) 5000 UNITS TABS Take 5,000 Units by mouth daily.    Marland Kitchen ezetimibe (ZETIA) 10 MG tablet Take 10 mg by mouth daily.     . fexofenadine (ALLEGRA) 180 MG tablet Take 180 mg by mouth daily as needed for allergies.     . fluticasone (FLONASE) 50 MCG/ACT nasal spray Place 1-2 sprays into both nostrils daily as needed for allergies or rhinitis.    Marland Kitchen HYDROmorphone (DILAUDID) 2 MG tablet Take 2-4 mg by mouth every 6 (six) hours as needed for severe pain (pain.).     Marland Kitchen methocarbamol (ROBAXIN) 750 MG tablet Take 750 mg by mouth every 6 (six) hours as needed for muscle spasms.   2  . NON FORMULARY Apply 1 application topically as needed (pain.). Deep Blue essential oil blend    . OIL BASE EX Apply 1 application topically as needed (pain.). Young Living Deep Relief    . oxyCODONE-acetaminophen (PERCOCET/ROXICET) 5-325 MG tablet Take 1 tablet by mouth every 6 (six) hours as needed for severe pain (pain.).    Marland Kitchen PARoxetine (PAXIL) 20 MG tablet Take 1 tablet (20 mg total) by mouth in the morning, at noon, and at bedtime. 270 tablet 1  . Pediatric Multivitamins-Iron (FLINTSTONES COMPLETE PO) Take 1 tablet by mouth 2 (two) times daily.    . peppermint oil liquid Apply 1 application topically as needed (pain.). Peppermint essential oil    . pramipexole (MIRAPEX) 0.25 MG tablet TAKE 1 TABLET BY MOUTH EVERY MORNING AND1 AND 1/2 TABS AT NIGHT. 225 tablet 0  . propranolol ER (INDERAL LA) 60 MG 24 hr capsule Take 1 capsule (60 mg total) by mouth daily. 90 capsule 1  . SYNTHROID 200 MCG tablet Take 200 mcg by mouth daily before  breakfast.    . Zinc 50 MG TABS Take 25 mg by mouth daily.     No current facility-administered medications for this visit.    Medication Side Effects: None  Allergies:  Allergies  Allergen Reactions  . Adhesive [Tape] Other (See Comments)    Blisters with tape after surgical procedures  . Morphine And Related Other (See Comments)    Feels like bugs crawling over her  . Nsaids Hives  . Buspar [Buspirone] Palpitations  . Ciprofloxacin Nausea Only    Upset stomach   . Codeine Other (See Comments)    Hyper  . Gabapentin Other (See Comments)    Suicidal ideation.  . Lodine [Etodolac] Nausea Only    Upset Stomach   . Lyrica [Pregabalin] Other (See Comments)    suicidal ideation  . Niacin And Related Other (See Comments)    flushing  . Pravastatin Other (See Comments)  achiness   . Septra [Sulfamethoxazole-Trimethoprim] Other (See Comments)    Stomach pains  . Tylox [Oxycodone-Acetaminophen] Other (See Comments)    Hyper  . Wellbutrin [Bupropion] Other (See Comments)    dizzy    Past Medical History:  Diagnosis Date  . Anginal pain ([place])    "on and off" (11/01/2011) - anxiety related  . Anxiety    takes Paxil daily and Xanax prn  . Arthritis    "knees" (10/16/2013)  . Chronic back pain    facet disease and bulging disc; "thoracic and lower back" (10/16/2013)  . Constipation    r/t pain meds;takes stool softener daily  . Depression   . Dysrhythmia    rapid HR on occasion-takes Propranolol daily  . Family history of anesthesia complication    ":PONV; mom and sisters"  . GERD (gastroesophageal reflux disease)    takes Prilosec   . H/O hiatal hernia   . History of bladder infections    > 38yr ago  . History of kidney stones   . Hyperlipidemia    takes LIpitor daily  . Hypothyroidism    takes Synthroid daily  . Insomnia   . Joint pain   . Joint swelling   . Muscle spasms of head or neck    lumbar and thoracic;takes Robaxin prn  . Peripheral neuropathy    . PONV (postoperative nausea and vomiting)   . Rosacea   . Seasonal allergies    takes allegra prn  . Sleep apnea     Family History  Problem Relation Age of Onset  . Heart disease Mother 49  . Heart failure Mother   . Heart attack Father   . Heart disease Father 45    Social History   Socioeconomic History  . Marital status: Married    Spouse name: Not on file  . Number of children: Not on file  . Years of education: Not on file  . Highest education level: Not on file  Occupational History  . Not on file  Tobacco Use  . Smoking status: Never Smoker  . Smokeless tobacco: Never Used  Substance and Sexual Activity  . Alcohol use: No  . Drug use: No  . Sexual activity: Yes    Birth control/protection: Surgical  Other Topics Concern  . Not on file  Social History Narrative  . Not on file   Social Determinants of Health   Financial Resource Strain:   . Difficulty of Paying Living Expenses: Not on file  Food Insecurity:   . Worried About Charity fundraiser in the Last Year: Not on file  . Ran Out of Food in the Last Year: Not on file  Transportation Needs:   . Lack of Transportation (Medical): Not on file  . Lack of Transportation (Non-Medical): Not on file  Physical Activity:   . Days of Exercise per Week: Not on file  . Minutes of Exercise per Session: Not on file  Stress:   . Feeling of Stress : Not on file  Social Connections:   . Frequency of Communication with Friends and Family: Not on file  . Frequency of Social Gatherings with Friends and Family: Not on file  . Attends Religious Services: Not on file  . Active Member of Clubs or Organizations: Not on file  . Attends Archivist Meetings: Not on file  . Marital Status: Not on file  Intimate Partner Violence:   . Fear of Current or Ex-Partner: Not on file  .  Emotionally Abused: Not on file  . Physically Abused: Not on file  . Sexually Abused: Not on file    Past Medical History, Surgical  history, Social history, and Family history were reviewed and updated as appropriate.   Please see review of systems for further details on the patient's review from today.   Objective:   Physical Exam:  There were no vitals taken for this visit.  Physical Exam Neurological:     Mental Status: She is alert and oriented to person, place, and time.     Cranial Nerves: No dysarthria.  Psychiatric:        Attention and Perception: Attention normal.        Mood and Affect: Mood is anxious. Mood is not depressed.        Speech: Speech normal.        Behavior: Behavior is cooperative.        Thought Content: Thought content normal. Thought content is not paranoid or delusional. Thought content does not include homicidal or suicidal ideation. Thought content does not include homicidal or suicidal plan.        Cognition and Memory: Cognition and memory normal.        Judgment: Judgment normal.     Comments: Residual sig obsessions and comnpulsive prayer she's addressing in therapy.     Lab Review:     Component Value Date/Time   NA 139 10/16/2018 0558   NA 141 06/15/2018 1411   K 4.1 10/16/2018 0558   CL 106 10/16/2018 0558   CO2 20 (L) 10/16/2018 0558   GLUCOSE 157 (H) 10/16/2018 0558   BUN 16 10/16/2018 0558   BUN 13 06/15/2018 1411   CREATININE 0.98 10/16/2018 0558   CALCIUM 8.5 (L) 10/16/2018 0558   PROT 7.7 10/08/2013 1050   ALBUMIN 3.9 10/08/2013 1050   AST 23 10/08/2013 1050   ALT 17 10/08/2013 1050   ALKPHOS 72 10/08/2013 1050   BILITOT 0.4 10/08/2013 1050   GFRNONAA >60 10/16/2018 0558   GFRAA >60 10/16/2018 0558       Component Value Date/Time   WBC 13.4 (H) 10/16/2018 0558   RBC 3.45 (L) 10/16/2018 0558   HGB 11.0 (L) 10/16/2018 0558   HCT 33.2 (L) 10/16/2018 0558   PLT 258 10/16/2018 0558   MCV 96.2 10/16/2018 0558   MCH 31.9 10/16/2018 0558   MCHC 33.1 10/16/2018 0558   RDW 13.6 10/16/2018 0558   LYMPHSABS 1,672 01/25/2018 1202   MONOABS 0.5 10/08/2013  1050   EOSABS 149 01/25/2018 1202   BASOSABS 61 01/25/2018 1202    No results found for: POCLITH, LITHIUM   No results found for: PHENYTOIN, PHENOBARB, VALPROATE, CBMZ   .res Assessment: Plan:    Obsessive-compulsive disorder, unspecified type - Plan: PARoxetine (PAXIL) 20 MG tablet  Social anxiety disorder - Plan: PARoxetine (PAXIL) 20 MG tablet, propranolol ER (INDERAL LA) 60 MG 24 hr capsule  Generalized anxiety disorder - Plan: PARoxetine (PAXIL) 20 MG tablet, ALPRAZolam (XANAX) 0.5 MG tablet, propranolol ER (INDERAL LA) 60 MG 24 hr capsule  Recurrent major depressive disorder, in remission ([place]) - Plan: PARoxetine (PAXIL) 20 MG tablet  Early onset dysthymia - Plan: PARoxetine (PAXIL) 20 MG tablet  Overeating disorder  Restless legs syndrome - Plan: pramipexole (MIRAPEX) 0.25 MG tablet   Greater than 50% of 30 min of non face to face time with patient was spent on counseling and coordination of care. We discussed She overall has been satisfied with the meds  in spite of being on a relatively low dose of paroxetine 40 mg given her history of treatment resistant OCD.  She is making progress in therapy that is helping.  She asks about possibly increasing the paroxetine back to 60 mg which she is taken before given the additional stressors of pending surgery.  Obviously I think that is a reasonable thing to do.     That may also decrease her need for the extra Xanax that she has been taking lately.  There is no evidence for any Xanax abuse or overuse or misuse nor tolerance nor dependence. We discussed the short-term risks associated with benzodiazepines including sedation and increased fall risk among others.  Discussed long-term side effect risk including dependence, potential withdrawal symptoms, and the potential eventual dose-related risk of dementia.  Xanax is OK with Dilaudid at dosages prescribed but don't exceed dosages.  She has not abused meds in the past.  Propranolol is also  helping her anxiety and she's satisfied.    Her restless legs is managed as long as she takes the pramipexole.  Dressed her fears about the Covid vaccine and encouraged her to get the vaccine.   Explained nature of way SSRI work for anxiety and how higher dose can improve her anxiety over months and possibly longer.  Big advantage to increasing the dose.  Discussed this in depth including side effects. No med changes today except for the increase in paroxetine to 60 mg daily. Pt needs high dosage bc of TR OCD.  This is an appropriate dose for OCD and typical and tolerated.  Follow-up 6 months  Lynder Parents, MD, DFAPA.  Future Appointments  Date Time Provider [place]  02/20/2019  2:00 PM [name], PhD [facility] None  03/12/2019  5:20 PM [facility] 3 [facility]    No orders of the defined types were placed in this encounter.     -------------------------------

## 2019-02-20 ENCOUNTER — Ambulatory Visit: Payer: Medicare PPO | Admitting: Psychiatry

## 2019-03-12 ENCOUNTER — Ambulatory Visit: Payer: Medicare PPO

## 2019-03-12 ENCOUNTER — Telehealth: Payer: Self-pay | Admitting: Psychiatry

## 2019-03-12 NOTE — Telephone Encounter (Signed)
The drug interaction issue is not significant.  The risk of increasing serotonin but it is not clinically dangerous.  She can take the Flexeril with the Paxil.

## 2019-03-12 NOTE — Telephone Encounter (Signed)
Pt had back surgery and the Dr wants to put her on Flexeril. When she went to the pharmacy to get her rx they told her that flexeril and paxil may interact. Please call.

## 2019-03-12 NOTE — Telephone Encounter (Signed)
Patient notified it's okay to take Flexeril with her Paroxetine.

## 2019-03-19 ENCOUNTER — Telehealth: Payer: Self-pay

## 2019-03-19 ENCOUNTER — Ambulatory Visit (INDEPENDENT_AMBULATORY_CARE_PROVIDER_SITE_OTHER): Payer: Medicare PPO | Admitting: Psychiatry

## 2019-03-19 DIAGNOSIS — F4010 Social phobia, unspecified: Secondary | ICD-10-CM | POA: Diagnosis not present

## 2019-03-19 DIAGNOSIS — F429 Obsessive-compulsive disorder, unspecified: Secondary | ICD-10-CM | POA: Diagnosis not present

## 2019-03-19 DIAGNOSIS — F341 Dysthymic disorder: Secondary | ICD-10-CM

## 2019-03-19 DIAGNOSIS — M4316 Spondylolisthesis, lumbar region: Secondary | ICD-10-CM | POA: Diagnosis not present

## 2019-03-19 DIAGNOSIS — Z9889 Other specified postprocedural states: Secondary | ICD-10-CM

## 2019-03-19 DIAGNOSIS — F3341 Major depressive disorder, recurrent, in partial remission: Secondary | ICD-10-CM

## 2019-03-19 NOTE — Progress Notes (Signed)
Psychotherapy Progress Note [facility] [name], PhD LP  Patient ID: [name]     MRN: [id number] Therapy format: Individual psychotherapy Date: 03/19/2019      Start: 11:23a     Stop: 12:12p     Time Spent: 49 min Location: Telehealth visit -- I connected with this patient by an approved telecommunication method (video), with her informed consent, and verifying identity and patient privacy.  I was located at my office and patient at her home.  As needed, we discussed the limitations, risks, and security and privacy concerns associated with telehealth service, including the availability and conditions which currently govern in-person appointments and the possibility that 3rd-party payment may not be fully guaranteed and she may be responsible for charges.  After she indicated understanding, we proceeded with the session.  Also discussed treatment planning, as needed, including ongoing verbal agreement with the plan, the opportunity to ask and answer all questions, her demonstrated understanding of instructions, and her readiness to call the office should symptoms worsen or she feels she is in a crisis state and needs more immediate and tangible assistance.   Session narrative (presenting needs, interim history, self-report of stressors and symptoms, applications of prior therapy, status changes, and interventions made in session) Avoiding COVID vaccine on hearsay about side effects, though each of her doctors say it's safe, fine.  Both of her kids are not taking it.  Discussed concerns, assured confidence that it is safe and effective, and most likely her son-in-law's position is overworked and irrational.    Asks how you "know" that you are trusting, allegedly asking "for a friend".  Discussed, then quickly revealed she was not actually asking for a friend but herself.  Addressed perceived need to dress it up, how it is OK to just ask things directly rather than  invent false shame.  Much better to ask than assume.  Confesses renewed obsessions about illness, prompted by her recovery from back surgery being more complicated than expected.  Outlook to be June before she can stoop, squat, lift, which is an intimidating period of disability.  Still in pain, though it's different.  Surgery was October 17.  Keeps telling self she shouldn't feel, think certain ways.  Again confronted about "shoulding" herself and normalized feelings and ideas coming up.  Made clear that she should in no way "just be over it" for having been seen in therapy as we have -- her pattern is several decades old, she has factors that interfere with making a lot of strong new memories, previous therapy was focused in some other directions, and her course with me has been interrupted a number of times by medical needs.  It takes plenty of time, and it works as one is free to focus on practicing different responses and motivated to practice trusting what feels uncertain.  Broached the idea of journaling battles with OCD to better objectify, affirm, and remember coping exercise.  Therapeutic modalities: Cognitive Behavioral Therapy, Solution-Oriented/Positive Psychology, Ego-Supportive and Faith-sensitive  Mental Status/Observations:  Appearance:   Casual     Behavior:  Appropriate  Motor:  Normal exc. Diminished movement s/p surgery  Speech/Language:  Exc. pain  Clear and Coherent and self-interrupting and hesitancy  Affect:  Appropriate  Mood:  anxious and dysthymic  Thought process:  normal  Thought content:    Obsessions  Sensory/Perceptual disturbances:    WNL  Orientation:  Fully oriented  Attention:  Fair  Concentration:  Good  Memory:  grossly intact  Insight:    Fair  Judgment:   Good  Impulse Control:  Good   Risk Assessment: Danger to Self: No Self-injurious Behavior: No Danger to Others: No Physical Aggression / Violence: No Duty to Warn: No Access to Firearms a  concern: No  Assessment of progress:  progressing  Diagnosis:   ICD-10-CM   1. Obsessive-compulsive disorder, unspecified type  F42.9   2. Social anxiety disorder  F40.10   3. Spondylolisthesis of lumbar region  M43.16    s/p surgery October, with muscled spasms   4. 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 Recurrent major depression in partial remission (HCC)  F33.41   6. History of general anesthesia  Z98.890    Plan:  . OCD journaling -- date, time, intrusive thought, feeling, evidence for/against, whether I can tell it's OCD "talking", better explanation/thought, re-rate feeling -- 1-3 times a day . Other recommendations/advice as may be noted above . Continue to utilize previously learned skills ad lib . Maintain medication as prescribed and work faithfully with relevant prescriber(s) if any changes are desired or seem indicated . Call the clinic on-call service, present to ER, or call 911 if any life-threatening psychiatric crisis . Return in about 2 weeks (around 04/02/2019).Marland Kitchen  Next scheduled visit in this office Visit date not found.  [name], PhD [name], PhD LP Clinical Psychologist, [facility] [address] [place], [place] [address] [phone]

## 2019-03-19 NOTE — Telephone Encounter (Signed)
Prior authorization submitted for PAROXETINE 20 mg #270 for quantity, approved effective 01/04/2019-01/03/2020 through [facility]. PA# [id number]   Submitted through cover my meds

## 2019-04-10 ENCOUNTER — Ambulatory Visit: Payer: Medicare PPO | Admitting: Psychiatry

## 2019-04-11 ENCOUNTER — Other Ambulatory Visit: Payer: Self-pay | Admitting: Psychiatry

## 2019-04-11 DIAGNOSIS — F411 Generalized anxiety disorder: Secondary | ICD-10-CM

## 2019-04-11 DIAGNOSIS — F4010 Social phobia, unspecified: Secondary | ICD-10-CM

## 2019-04-18 DIAGNOSIS — H43811 Vitreous degeneration, right eye: Secondary | ICD-10-CM | POA: Diagnosis not present

## 2019-04-18 DIAGNOSIS — H25813 Combined forms of age-related cataract, bilateral: Secondary | ICD-10-CM | POA: Diagnosis not present

## 2019-04-18 DIAGNOSIS — H1045 Other chronic allergic conjunctivitis: Secondary | ICD-10-CM | POA: Diagnosis not present

## 2019-04-18 DIAGNOSIS — H04123 Dry eye syndrome of bilateral lacrimal glands: Secondary | ICD-10-CM | POA: Diagnosis not present

## 2019-04-29 ENCOUNTER — Ambulatory Visit (INDEPENDENT_AMBULATORY_CARE_PROVIDER_SITE_OTHER): Payer: Medicare PPO | Admitting: Psychiatry

## 2019-04-29 DIAGNOSIS — F411 Generalized anxiety disorder: Secondary | ICD-10-CM

## 2019-04-29 DIAGNOSIS — Z630 Problems in relationship with spouse or partner: Secondary | ICD-10-CM

## 2019-04-29 DIAGNOSIS — F3341 Major depressive disorder, recurrent, in partial remission: Secondary | ICD-10-CM

## 2019-04-29 DIAGNOSIS — F429 Obsessive-compulsive disorder, unspecified: Secondary | ICD-10-CM

## 2019-04-29 NOTE — Progress Notes (Signed)
Psychotherapy Progress Note [facility] [name], PhD LP  Patient ID: [name]     MRN: [id number] Therapy format: Individual psychotherapy Date: 04/29/2019      Start: 11:21a     Stop: 12:10p     Time Spent: 49 min Location: Telehealth visit -- I connected with this patient by an approved telecommunication method (video), with her informed consent, and verifying identity and patient privacy.  I was located at my office and patient at her home.  As needed, we discussed the limitations, risks, and security and privacy concerns associated with telehealth service, including the availability and conditions which currently govern in-person appointments and the possibility that 3rd-party payment may not be fully guaranteed and she may be responsible for charges.  After she indicated understanding, we proceeded with the session.  Also discussed treatment planning, as needed, including ongoing verbal agreement with the plan, the opportunity to ask and answer all questions, her demonstrated understanding of instructions, and her readiness to call the office should symptoms worsen or she feels she is in a crisis state and needs more immediate and tangible assistance.   Session narrative (presenting needs, interim history, self-report of stressors and symptoms, applications of prior therapy, status changes, and interventions made in session) Grandchildren in this week, hard not being able to play actively with them.  Feels some improvement since her back surgery, able to reduce pain medication, not as forgetful.  Trusts no that she is not getting Alzheimer's.  Has felt surprised by what recovery takes, would like for her doctor to be clearer   Son in law, 70yo, had a scare with the "widowmaker" artery in his heart, had successful stent surgery.  She was impressed by how her daughter (also OCD) dd with it.  One issue in that he was initially refusing to take his COVID test for  believing the swab was poisoned [employer]).  Reportedly, he still believes it was poisoned, but he accepted the test in order to live.  (Conspiracy is that Flossie Dibble came up with the poison, and the purpose is to control global population, and he has gone so far as to tell PT she is poisoned, too.)    Acknowledges her anxiety has been largely fuelled by her husband's behavior.  Prominently, concerned about his road rage, and in fact turned down a 5-hour trip he wants to give her, explicitly mentioning his driving.  Amazed at her own assertiveness.  Discussed whether he might be able to prove better to her.  Saw him in control of emotions and driving on a weeklong trip to [place].  Very positive trip away, felt like it reinvigorated their relationship, no anger, negativity, scary driving, or sexual intimidation    Side discussion about sexuality.  Apparently, Jimmy's diabetes has created ED, and he's been throwing in the towel on sex.  PT asked her gynecologist for advice, purchased a penile pump for him but he has not opened it, says he is content with letting sex be over with.  Discussed her wishes (not yet for her, want him to be able to still feel good) and ways of drawing him out so they can talk it over as something they can try after reconciling whether he wants to try something.  Glad to have begun journaling.  Doing so has helped her connect her childhood experiences and clicked that she has a reason to feel the way she feels doubting her salvation  and being down on herself, but now it's clear she was conditioned to doubt and fear.  Wants to know how to get free of it now.  Pointed out that she is already doing that by becoming more assertive about what she does not want in driving and does want in intimacy.  Obsessions with salvation are down as well, interpreted all as the benefit of being more clear-eyed about where her doubts come from, as well as mentally  clearer for being able to reduce pain medication.  Tied into her spiritual growth, trusting more of the time that God means to help more than judge, and does not take back promises.  Therapeutic modalities: Cognitive Behavioral Therapy, Solution-Oriented/Positive Psychology and Faith-sensitive  Mental Status/Observations:  Appearance:   Casual     Behavior:  Appropriate  Motor:  Normal  Speech/Language:   Clear and Coherent  Affect:  Appropriate and lighter  Mood:  considerably less anxious  Thought process:  normal  Thought content:    WNL and less obsessions  Sensory/Perceptual disturbances:    WNL  Orientation:  Fully oriented  Attention:  Good    Concentration:  Good  Memory:  grossly intact  Insight:    Fair  Judgment:   Good  Impulse Control:  Good   Risk Assessment: Danger to Self: No Self-injurious Behavior: No Danger to Others: No Physical Aggression / Violence: No Duty to Warn: No Access to Firearms a concern: No  Assessment of progress:  progressing  Diagnosis:   ICD-10-CM   1. Obsessive-compulsive disorder, unspecified type  F42.9   2. Recurrent major depression in partial remission ([place])  F33.41   3. Generalized anxiety disorder  F41.1   4. Relationship problem between partners  Z63.0    Plan:  . Continue journaling . Self-affirm benefits of working her trust in a more benevolent God . Continue to refuse prayer compulsions and trust, where identifiable . Option to approach H with pointers about learning his motivations and feelings around ED and pump . Encourage to allow more for H to drive, see how he does, option to ask him how he wants to handle it if a road situation does anger him . Other recommendations/advice as may be noted above . Continue to utilize previously learned skills ad lib . Maintain medication as prescribed and work faithfully with relevant prescriber(s) if any changes are desired or seem indicated . Call the clinic on-call service,  present to ER, or call 911 if any life-threatening psychiatric crisis Return in about 1 month (around 05/29/2019) for time at discretion. . Already scheduled visit in this office 05/13/2019.  [name], PhD [name], PhD LP Clinical Psychologist, [facility] [address] [place], [place] [address] [phone]

## 2019-05-08 DIAGNOSIS — L821 Other seborrheic keratosis: Secondary | ICD-10-CM | POA: Diagnosis not present

## 2019-05-08 DIAGNOSIS — L710 Perioral dermatitis: Secondary | ICD-10-CM | POA: Diagnosis not present

## 2019-05-08 DIAGNOSIS — L718 Other rosacea: Secondary | ICD-10-CM | POA: Diagnosis not present

## 2019-05-08 DIAGNOSIS — L905 Scar conditions and fibrosis of skin: Secondary | ICD-10-CM | POA: Diagnosis not present

## 2019-05-13 ENCOUNTER — Telehealth: Payer: Self-pay | Admitting: Psychiatry

## 2019-05-13 ENCOUNTER — Ambulatory Visit (INDEPENDENT_AMBULATORY_CARE_PROVIDER_SITE_OTHER): Payer: Medicare PPO | Admitting: Psychiatry

## 2019-05-13 DIAGNOSIS — F3341 Major depressive disorder, recurrent, in partial remission: Secondary | ICD-10-CM

## 2019-05-13 DIAGNOSIS — Z9889 Other specified postprocedural states: Secondary | ICD-10-CM

## 2019-05-13 DIAGNOSIS — F429 Obsessive-compulsive disorder, unspecified: Secondary | ICD-10-CM | POA: Diagnosis not present

## 2019-05-13 DIAGNOSIS — M4316 Spondylolisthesis, lumbar region: Secondary | ICD-10-CM

## 2019-05-13 DIAGNOSIS — F341 Dysthymic disorder: Secondary | ICD-10-CM

## 2019-05-13 NOTE — Telephone Encounter (Signed)
Ms. [name], [name] are scheduled for a virtual visit with your provider today.    Just as we do with appointments in the office, we must obtain your consent to participate.  Your consent will be active for this visit and any virtual visit you may have with one of our providers in the next 365 days.    If you have a MyChart account, I can also send a copy of this consent to you electronically.  All virtual visits are billed to your insurance company just like a traditional visit in the office.  As this is a virtual visit, video technology does not allow for your provider to perform a traditional examination.  This may limit your provider's ability to fully assess your condition.  If your provider identifies any concerns that need to be evaluated in person or the need to arrange testing such as labs, EKG, etc, we will make arrangements to do so.    Although advances in technology are sophisticated, we cannot ensure that it will always work on either your end or our end.  If the connection with a video visit is poor, we may have to switch to a telephone visit.  With either a video or telephone visit, we are not always able to ensure that we have a secure connection.   I need to obtain your verbal consent now.   Are you willing to proceed with your visit today?   [name] has provided verbal consent on 05/13/2019 for a virtual visit (video or telephone).   [name], PhD 05/13/2019  2:14 PM

## 2019-05-13 NOTE — Progress Notes (Signed)
Psychotherapy Progress Note [facility] [name], PhD LP  Patient ID: [name]     MRN: [id number] Therapy format: Individual psychotherapy Date: 05/13/2019      Start: 2:10p     Stop: 3:00p     Time Spent: 50 min Location: Telehealth visit -- I connected with this patient by an approved telecommunication method (video), with her informed consent, and verifying identity and patient privacy.  I was located at my office and patient at her home.  As needed, we discussed the limitations, risks, and security and privacy concerns associated with telehealth service, including the availability and conditions which currently govern in-person appointments and the possibility that 3rd-party payment may not be fully guaranteed and she may be responsible for charges.  After she indicated understanding, we proceeded with the session.  Also discussed treatment planning, as needed, including ongoing verbal agreement with the plan, the opportunity to ask and answer all questions, her demonstrated understanding of instructions, and her readiness to call the office should symptoms worsen or she feels she is in a crisis state and needs more immediate and tangible assistance.   Session narrative (presenting needs, interim history, self-report of stressors and symptoms, applications of prior therapy, status changes, and interventions made in session) Big realization last visit how much it means to her to know she is doing something well.  Rode last session's affirmation for days and days, realizes she has been burdened for decades with guilt and doubt from a childhood full of discouragement.  Felt mastery over OCD for days, no worry over salvation, etc.  It broke after she saw a sermon on salvation warning that not everyone who thinks they are going to heaven will go there, started to feel like she needed proof.  Hung up on the "Not everyone who says, 'Lord, Lord'" passage, sparking doubt  about her salvation.  Challenged to remember the character of God, who doesn't play "Gotcha" games like that, does not need her to perfect anything to receive her.  Also examined her understanding of the trigger passage, advising that it applies to hypocrites and dilettantes, not to anyone who honestly tries, like her.  Coached to acknowledge doubts and uneasiness, notice agonizing as the mental prison it is, and trust anyway.    Separate issue in relationship with her son [name], who has unknowingly hurt her feelings.  Saturday evening was scheduled Mother's Day celebration, at son's house, and he didn't get up from the chair or give her a hug.  Did later, but only after specious argument that she has always been the hug-giver and why should things have to change now.  Interpreted a combined problem of self-centeredness and love languages (hers touch, his quality time).  He has an anxiety disorder and says his medicine makes him feel nothing, but sounds like he is actually both more self-absorbed and does not easily recognize other love languages beside his own.  Discussed, illuminated, and recommended it be OK to ask him to recognize that love goes both ways and that she would particularly like some in her own love language.  Grateful, happy for the further encouragement.  Therapeutic modalities: Cognitive Behavioral Therapy, Solution-Oriented/Positive Psychology, Faith-sensitive and Assertiveness/Communication  Mental Status/Observations:  Appearance:   Casual     Behavior:  Appropriate  Motor:  Normal  Speech/Language:   Clear and Coherent  Affect:  Appropriate  Mood:  more euthymic  Thought process:  concrete  Thought content:    Obsessions  Sensory/Perceptual disturbances:    WNL  Orientation:  Fully oriented  Attention:  Good    Concentration:  Fair  Memory:  WNL  Insight:    Fair  Judgment:   Good  Impulse Control:  Good   Risk Assessment: Danger to Self: No Self-injurious Behavior:  No Danger to Others: No Physical Aggression / Violence: No Duty to Warn: No Access to Firearms a concern: No  Assessment of progress:  progressing  Diagnosis:   ICD-10-CM   1. Obsessive-compulsive disorder - scrupulosity type  F42.9   2. 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 Spondylolisthesis of lumbar region - s/p surgery  M43.16   5. History of general anesthesia  Z98.890    Plan:  . OK to ask son to work on showing love in her love language  . Continue to submit spiritual doubts to the tests of God's caring character, pray to have things made plain if mistaken, and practice trust that God recognizes the effort and the sincerity with which she practices her faith rather than fear small and murky things will somehow violate her salvation . Other recommendations/advice as may be noted above . Continue to utilize previously learned skills ad lib . Maintain medication as prescribed and work faithfully with relevant prescriber(s) if any changes are desired or seem indicated . Call the clinic on-call service, present to ER, or call 911 if any life-threatening psychiatric crisis Return in about 2 weeks (around 05/27/2019) for will call. . Already scheduled visit in this office Visit date not found.  [name], PhD [name], PhD LP Clinical Psychologist, [facility] [address], [place], [place] [address] [phone]

## 2019-05-14 DIAGNOSIS — Z6841 Body Mass Index (BMI) 40.0 and over, adult: Secondary | ICD-10-CM | POA: Diagnosis not present

## 2019-05-14 DIAGNOSIS — Z9884 Bariatric surgery status: Secondary | ICD-10-CM | POA: Diagnosis not present

## 2019-05-14 DIAGNOSIS — Z79899 Other long term (current) drug therapy: Secondary | ICD-10-CM | POA: Diagnosis not present

## 2019-05-27 DIAGNOSIS — H2513 Age-related nuclear cataract, bilateral: Secondary | ICD-10-CM | POA: Diagnosis not present

## 2019-05-27 DIAGNOSIS — H524 Presbyopia: Secondary | ICD-10-CM | POA: Diagnosis not present

## 2019-05-27 DIAGNOSIS — H04123 Dry eye syndrome of bilateral lacrimal glands: Secondary | ICD-10-CM | POA: Diagnosis not present

## 2019-06-12 ENCOUNTER — Ambulatory Visit
Admission: RE | Admit: 2019-06-12 | Discharge: 2019-06-12 | Disposition: A | Discharge status: Home / Self Care | Payer: Medicare PPO | Source: Ambulatory Visit | Attending: Family Medicine | Admitting: Family Medicine

## 2019-06-12 ENCOUNTER — Ambulatory Visit: Payer: Medicare PPO

## 2019-06-12 ENCOUNTER — Other Ambulatory Visit: Payer: Self-pay

## 2019-06-12 DIAGNOSIS — Z1231 Encounter for screening mammogram for malignant neoplasm of breast: Secondary | ICD-10-CM

## 2019-06-14 DIAGNOSIS — N3001 Acute cystitis with hematuria: Secondary | ICD-10-CM | POA: Diagnosis not present

## 2019-06-14 DIAGNOSIS — R300 Dysuria: Secondary | ICD-10-CM | POA: Diagnosis not present

## 2019-06-14 DIAGNOSIS — E039 Hypothyroidism, unspecified: Secondary | ICD-10-CM | POA: Diagnosis not present

## 2019-06-17 ENCOUNTER — Ambulatory Visit (INDEPENDENT_AMBULATORY_CARE_PROVIDER_SITE_OTHER): Payer: Medicare PPO | Admitting: Psychiatry

## 2019-06-17 DIAGNOSIS — F429 Obsessive-compulsive disorder, unspecified: Secondary | ICD-10-CM | POA: Diagnosis not present

## 2019-06-17 DIAGNOSIS — F341 Dysthymic disorder: Secondary | ICD-10-CM

## 2019-06-17 DIAGNOSIS — F4010 Social phobia, unspecified: Secondary | ICD-10-CM

## 2019-06-17 DIAGNOSIS — M4316 Spondylolisthesis, lumbar region: Secondary | ICD-10-CM | POA: Diagnosis not present

## 2019-06-17 DIAGNOSIS — Z9889 Other specified postprocedural states: Secondary | ICD-10-CM

## 2019-06-17 DIAGNOSIS — F3341 Major depressive disorder, recurrent, in partial remission: Secondary | ICD-10-CM

## 2019-06-17 NOTE — Progress Notes (Signed)
Psychotherapy Progress Note [facility] [name], PhD LP  Patient ID: [name]     MRN: [id number] Therapy format: Individual psychotherapy Date: 06/17/2019      Start: 1:06p     Stop: 1:56p     Time Spent: 50 min Location: Telehealth visit -- I connected with this patient by an approved telecommunication method (video), with her informed consent, and verifying identity and patient privacy.  I was located at my office and patient at her home.  As needed, we discussed the limitations, risks, and security and privacy concerns associated with telehealth service, including the availability and conditions which currently govern in-person appointments and the possibility that 3rd-party payment may not be fully guaranteed and she may be responsible for charges.  After she indicated understanding, we proceeded with the session.  Also discussed treatment planning, as needed, including ongoing verbal agreement with the plan, the opportunity to ask and answer all questions, her demonstrated understanding of instructions, and her readiness to call the office should symptoms worsen or she feels she is in a crisis state and needs more immediate and tangible assistance.   Session narrative (presenting needs, interim history, self-report of stressors and symptoms, applications of prior therapy, status changes, and interventions made in session) Transmission problems initially, seem to be about slow transmission on her end, c/o badly delayed picture of [place] on her end, though her own picture stopped freezing when she moved closer to her own router.    On the fence about getting vaccination, feels it's an attempt to control the population, "but we'll probably get it eventually, b/c they're going to start requiring it."  Briefly assured otherwise, from my own information, compared to good reasons for childhood vaccinations, cast doubt on the idea of govt. mandate (e.g., children may go  to alternative school if not vaxed), and encouraged when she does vax, it be for her "own good reasons" (i.e., prevent disease and contagion for self and others).    Been trying to push forward in trusting things despite OCD doubts.  "Been a basket case" but still trying.  Gives self 4/10 score for trying to resist urges, b/c she's still trying but feels like she's failing.  Assured that feeling uncomfortable is not what she wants, but it is a sign she is taking the battle to OCD.  Example lately, was watching Simone Curia Sunday morning broadcast, heard word that there should be "changes" when you give your life to [facility], went back to when her father told her she knew she was [place] but he didn't see any changes in her, fired up a new round of worrying whether she is saved.  Husband no comfort, tried to get urgent appt (unable), agonized about saying the Sinners' Prayer, lost sleep over it, prayed for rededication instead of the formalism and prayed more openly, not by rote.  Benefit of doing so, partly for being able to speak more clearly with God, partly for self-reassuring.  Still unsettling, and she struggles with how she dislikes herself, prayed for God to help her like herself.  Struggling now with the idea that she shouldn't "need" her family to be happy, but unable to specify.  Eventually got clear that she is afraid she has a pathological need for attention, primarily b/c she has a high need for touch.  Attempted to recharacterize as hunger, a legitimate need.  She knows he paid more attention when she was 40 lbs lighter, which tends to make her  think she's unattractive, plus daughter disillusioned her about whether she taught touch growing up.    Asks how she can begin to like herself better.  Framed [place] homework to ask, straight up, for a hug or an arm or a touch, because both the asking foe it and the getting it treat her like she's worthwhile.    Has called her weight loss surgeon for  help reducing weight, is now taking Topamax 25mg  BID, has lost 12 lbs.    Answered question about her Bible study, God speaking to her that she is His child and has purpose   Monitoring for cognitive effects of numerous anesthesias and pain medication -- continues longterm recovery from spinal surgery, visibly able to get up and around on video, but known physical limitations and pain medication continue.  Spontaneous speech observed to still be hesitant and difficulty with word-finding and fluency under stress, attributed both to longstanding anxiety/self-consciousness and possibly anesthesia sequelae.  Therapeutic modalities: Cognitive Behavioral Therapy, Solution-Oriented/Positive Psychology, Ego-Supportive and Faith-sensitive  Mental Status/Observations:  Appearance:   Casual and Neat     Behavior:  Appropriate  Motor:  Normal and excet postsurgical limitations  Speech/Language:   generally OK, dysfluency and word-finding problems under stress  Affect:  Appropriate  Mood:  anxious  Thought process:  normal  Thought content:    Obsessions  Sensory/Perceptual disturbances:    WNL  Orientation:  Fully oriented  Attention:  Good    Concentration:  Good  Memory:  WNL  Insight:    Good  Judgment:   Fair  Impulse Control:  Fair   Risk Assessment: Danger to Self: No Self-injurious Behavior: No Danger to Others: No Physical Aggression / Violence: No Duty to Warn: No Access to Firearms a concern: No  Assessment of progress:  stabilized  Diagnosis:   ICD-10-CM   1. Obsessive-compulsive disorder - scrupulosity type  F42.9   2. 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 Social anxiety disorder  F40.10   5. Spondylolisthesis of lumbar region - s/p surgery  M43.16   6. History of general anesthesia - r/o effects on cognition and fluency  Z98.890    Plan:  . Continue disputing/resisting ad lib compulsions to pray specific rote  prayer for salvation but instead pray conversationally and express/ask for help coping with the anxious feeling instead . Encouraged ask husband, other loved ones for hugs/touch has desired, seeking to just ask for it rather than ask why not.  If needed, explain it's therapy homework. . Other recommendations/advice as may be noted above . Continue to utilize previously learned skills ad lib . Maintain medication as prescribed and work faithfully with relevant prescriber(s) if any changes are desired or seem indicated . Call the clinic on-call service, present to ER, or call 911 if any life-threatening psychiatric crisis Return in about 3 weeks (around 07/08/2019) for session(s) already scheduled. . Already scheduled visit in this office 07/09/2019.  [name], PhD [name], PhD LP Clinical Psychologist, [facility] [address], [place], [place] [address] [phone]

## 2019-07-09 ENCOUNTER — Ambulatory Visit (INDEPENDENT_AMBULATORY_CARE_PROVIDER_SITE_OTHER): Payer: Medicare PPO | Admitting: Psychiatry

## 2019-07-09 DIAGNOSIS — F429 Obsessive-compulsive disorder, unspecified: Secondary | ICD-10-CM

## 2019-07-09 DIAGNOSIS — Z9889 Other specified postprocedural states: Secondary | ICD-10-CM

## 2019-07-09 DIAGNOSIS — M4316 Spondylolisthesis, lumbar region: Secondary | ICD-10-CM | POA: Diagnosis not present

## 2019-07-09 DIAGNOSIS — F411 Generalized anxiety disorder: Secondary | ICD-10-CM | POA: Diagnosis not present

## 2019-07-09 DIAGNOSIS — F3341 Major depressive disorder, recurrent, in partial remission: Secondary | ICD-10-CM

## 2019-07-09 DIAGNOSIS — F341 Dysthymic disorder: Secondary | ICD-10-CM

## 2019-07-09 NOTE — Progress Notes (Signed)
Psychotherapy Progress Note [facility] [name], PhD LP  Patient ID: [name]     MRN: [id number] Therapy format: Individual psychotherapy Date: 07/09/2019      Start: 3:12p     Stop: 4:00p     Time Spent: 48 min Location: Telehealth visit -- I connected with this patient by an approved telecommunication method (video), with her informed consent, and verifying identity and patient privacy.  I was located at my office and patient at her home.  As needed, we discussed the limitations, risks, and security and privacy concerns associated with telehealth service, including the availability and conditions which currently govern in-person appointments and the possibility that 3rd-party payment may not be fully guaranteed and she may be responsible for charges.  After she indicated understanding, we proceeded with the session.  Also discussed treatment planning, as needed, including ongoing verbal agreement with the plan, the opportunity to ask and answer all questions, her demonstrated understanding of instructions, and her readiness to call the office should symptoms worsen or she feels she is in a crisis state and needs more immediate and tangible assistance.   Session narrative (presenting needs, interim history, self-report of stressors and symptoms, applications of prior therapy, status changes, and interventions made in session) Question about diagnosis as seen in MyChart.  Does believe she has had anesthesia effects from close to 30 surgeries.  Admits she doesn't get good sleep -- frequently on phone late evening, after [name] goes to bed, says people call her then, may stay on phone until late, knows she gets keyed up for a while after 3:30am.  Encouraged to safeguard her sleep better.  Already takes vitamins.  May be interested in [place].  After last session, did not feel like she could ask for hugs from her son.  Feels generally it was enough to share she was  disappointed, wishes he could spontaneously offer.  Has begun reading Bible more like she used to, trying to discern what is/isn't God's voice.  Has prayed for God to show her a truer, more intimate relationship with Him.  Has also prayed for God's reassurance she is accepted, and scripture passages have begun to come up that speak more personally to her.  Even so, Jimmy's uncle Fritz Pickerel passed away yesterday, and she is fighting hard not to say The Sinner's Prayer compulsively as another death in the family draws attention to whether she is actually saved or fooling herself.  Similar temptations when hearing about other tragedies -- puts herself in the victim's place, compulsively thinks again about her own salvation and her own death.  Continued to advise the practice of expressing gratitude, asking God to remove her temptation, listening quietly for an encouraging answer, and in any event moving on with tangible, needful things.  Therapeutic modalities: Cognitive Behavioral Therapy, Solution-Oriented/Positive Psychology and Faith-sensitive  Mental Status/Observations:  Appearance:   Casual     Behavior:  Appropriate  Motor:  Normal  Speech/Language:   Clear and Coherent  Affect:  Appropriate  Mood:  anxious  Thought process:  normal  Thought content:    Obsessions  Sensory/Perceptual disturbances:    WNL  Orientation:  Fully oriented  Attention:  Good    Concentration:  Fair  Memory:  WNL  Insight:    Fair  Judgment:   Fair  Impulse Control:  Fair   Risk Assessment: Danger to Self: No Self-injurious Behavior: No Danger to Others: No Physical Aggression / Violence: No Duty to  Warn: No Access to Firearms a concern: No  Assessment of progress:  stabilized  Diagnosis:   ICD-10-CM   1. Obsessive-compulsive disorder - scrupulosity type  F42.9   2. Recurrent major depression in partial remission ([place])  F33.41   3. Generalized anxiety disorder  F41.1   4. 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 History of general anesthesia - r/o effects on cognition and fluency  Z98.890   6. Spondylolisthesis of lumbar region - s/p surgery  M43.16    Plan:  . Work on improving sleep readiness and conducive environment . Option to try over-the-counter supplement, consult doctor as desired . Continue to resist urges to pray specific, rote, formalized prayers on a crisis basis, trusting the God does not require that and practicing prayer as a warmer, two-way enterprise rather than incantation.  Endorse prayers of supplication for help recognizing and resisting OCD. Marland Kitchen Other recommendations/advice as may be noted above . Continue to utilize previously learned skills ad lib . Maintain medication as prescribed and work faithfully with relevant prescriber(s) if any changes are desired or seem indicated . Call the clinic on-call service, present to ER, or call 911 if any life-threatening psychiatric crisis Return for will call, time as available. . Already scheduled visit in this office Visit date not found.  [name], PhD [name], PhD LP Clinical Psychologist, [facility] [address] [place], [place] [address] [phone]

## 2019-07-22 DIAGNOSIS — K921 Melena: Secondary | ICD-10-CM | POA: Diagnosis not present

## 2019-07-22 DIAGNOSIS — R143 Flatulence: Secondary | ICD-10-CM | POA: Diagnosis not present

## 2019-08-02 DIAGNOSIS — Z1159 Encounter for screening for other viral diseases: Secondary | ICD-10-CM | POA: Diagnosis not present

## 2019-08-06 ENCOUNTER — Ambulatory Visit (INDEPENDENT_AMBULATORY_CARE_PROVIDER_SITE_OTHER): Payer: Medicare PPO | Admitting: Psychiatry

## 2019-08-06 DIAGNOSIS — F429 Obsessive-compulsive disorder, unspecified: Secondary | ICD-10-CM | POA: Diagnosis not present

## 2019-08-06 DIAGNOSIS — F411 Generalized anxiety disorder: Secondary | ICD-10-CM

## 2019-08-06 DIAGNOSIS — Z9889 Other specified postprocedural states: Secondary | ICD-10-CM | POA: Diagnosis not present

## 2019-08-06 DIAGNOSIS — F3341 Major depressive disorder, recurrent, in partial remission: Secondary | ICD-10-CM | POA: Diagnosis not present

## 2019-08-06 DIAGNOSIS — M4316 Spondylolisthesis, lumbar region: Secondary | ICD-10-CM

## 2019-08-06 DIAGNOSIS — F341 Dysthymic disorder: Secondary | ICD-10-CM

## 2019-08-06 NOTE — Progress Notes (Signed)
Psychotherapy Progress Note [facility] [name], PhD LP  Patient ID: [name]     MRN: [id number] Therapy format: Individual psychotherapy Date: 08/06/2019      Start: 11:21a     Stop: 12:10p     Time Spent: 49 min Location: Telehealth visit -- I connected with this patient by an approved telecommunication method (video), with her informed consent, and verifying identity and patient privacy.  I was located at my office and patient at her home.  As needed, we discussed the limitations, risks, and security and privacy concerns associated with telehealth service, including the availability and conditions which currently govern in-person appointments and the possibility that 3rd-party payment may not be fully guaranteed and she may be responsible for charges.  After she indicated understanding, we proceeded with the session.  Also discussed treatment planning, as needed, including ongoing verbal agreement with the plan, the opportunity to ask and answer all questions, her demonstrated understanding of instructions, and her readiness to call the office should symptoms worsen or she feels she is in a crisis state and needs more immediate and tangible assistance.   Session narrative (presenting needs, interim history, self-report of stressors and symptoms, applications of prior therapy, status changes, and interventions made in session) Colonoscopy tomorrow to rule out lower GI issues.  Anxiety has been worse, with three more deaths among people she and H know.  Former Theme [employer] manager untimely, suspicion of sleep apnea or MI as cause of death.  Triggering for salvation fears, but trying to cope with it by writing what she knows.    COVID news has also put a damper on Bible reading, devotions.  Extensive discussion of her thoughts and prayers, realizes she has been compulsively describing her state of spirit and mind but not actually asking God's help in prayer.  Realizes she maybe  fundamentally doesn't believe she's deserving.  Supportively challenged, from the perspective of faith, that it's not about deserving, and coached in trusting prayer asking God's help trusting it's a false alarm rather than asking again for specific salvation.  Likened OCD to someone else's car alarm -- not her responsibility, but admittedly loud and annoying until it subsides.  Therapeutic modalities: Cognitive Behavioral Therapy, Solution-Oriented/Positive Psychology and Faith-sensitive  Mental Status/Observations:  Appearance:   Casual     Behavior:  Appropriate  Motor:  Normal  Speech/Language:   Clear and Coherent  Affect:  Appropriate  Mood:  anxious  Thought process:  normal  Thought content:    Obsessions  Sensory/Perceptual disturbances:    WNL  Orientation:  Fully oriented  Attention:  Good    Concentration:  Fair  Memory:  WNL  Insight:    Fair  Judgment:   Fair  Impulse Control:  Fair   Risk Assessment: Danger to Self: No Self-injurious Behavior: No Danger to Others: No Physical Aggression / Violence: No Duty to Warn: No Access to Firearms a concern: No  Assessment of progress:  progressing  Diagnosis:   ICD-10-CM   1. Obsessive-compulsive disorder - scrupulosity type  F42.9   2. Recurrent major depression in partial remission ([place])  F33.41   3. Generalized anxiety disorder  F41.1   4. 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 History of general anesthesia - r/o effects on cognition and fluency  Z98.890   6. Spondylolisthesis of lumbar region - s/p surgery  M43.16    Plan:  . Continue to resist formalized prayers as urgent responses to anxiety, emphasize spontaneous,  2-way prayer . Other recommendations/advice as may be noted above . Continue to utilize previously learned skills ad lib . Maintain medication as prescribed and work faithfully with relevant prescriber(s) if any changes are desired or seem indicated . Call the clinic on-call service, present to ER, or  call 911 if any life-threatening psychiatric crisis Return in about 1 month (around 09/06/2019). . Already scheduled visit in this office 08/27/2019.  Kristin Serve, PhD [name], PhD LP Clinical Psychologist, [facility] [address] [place], [place] [address] [phone]

## 2019-08-07 DIAGNOSIS — K921 Melena: Secondary | ICD-10-CM | POA: Diagnosis not present

## 2019-08-12 DIAGNOSIS — H2513 Age-related nuclear cataract, bilateral: Secondary | ICD-10-CM | POA: Diagnosis not present

## 2019-08-14 DIAGNOSIS — M1812 Unilateral primary osteoarthritis of first carpometacarpal joint, left hand: Secondary | ICD-10-CM | POA: Diagnosis not present

## 2019-08-14 DIAGNOSIS — M1811 Unilateral primary osteoarthritis of first carpometacarpal joint, right hand: Secondary | ICD-10-CM | POA: Diagnosis not present

## 2019-08-14 DIAGNOSIS — M79642 Pain in left hand: Secondary | ICD-10-CM | POA: Diagnosis not present

## 2019-08-14 DIAGNOSIS — M180 Bilateral primary osteoarthritis of first carpometacarpal joints: Secondary | ICD-10-CM | POA: Diagnosis not present

## 2019-08-14 DIAGNOSIS — M79641 Pain in right hand: Secondary | ICD-10-CM | POA: Diagnosis not present

## 2019-08-14 DIAGNOSIS — M65331 Trigger finger, right middle finger: Secondary | ICD-10-CM | POA: Diagnosis not present

## 2019-08-20 DIAGNOSIS — M4316 Spondylolisthesis, lumbar region: Secondary | ICD-10-CM | POA: Diagnosis not present

## 2019-08-20 DIAGNOSIS — G8929 Other chronic pain: Secondary | ICD-10-CM | POA: Diagnosis not present

## 2019-08-20 DIAGNOSIS — Z79899 Other long term (current) drug therapy: Secondary | ICD-10-CM | POA: Diagnosis not present

## 2019-08-20 DIAGNOSIS — Z9884 Bariatric surgery status: Secondary | ICD-10-CM | POA: Diagnosis not present

## 2019-08-20 DIAGNOSIS — M545 Low back pain: Secondary | ICD-10-CM | POA: Diagnosis not present

## 2019-08-20 DIAGNOSIS — Z6841 Body Mass Index (BMI) 40.0 and over, adult: Secondary | ICD-10-CM | POA: Diagnosis not present

## 2019-08-27 ENCOUNTER — Ambulatory Visit (INDEPENDENT_AMBULATORY_CARE_PROVIDER_SITE_OTHER): Payer: Medicare PPO | Admitting: Psychiatry

## 2019-08-27 DIAGNOSIS — F341 Dysthymic disorder: Secondary | ICD-10-CM

## 2019-08-27 DIAGNOSIS — F429 Obsessive-compulsive disorder, unspecified: Secondary | ICD-10-CM

## 2019-08-27 DIAGNOSIS — F411 Generalized anxiety disorder: Secondary | ICD-10-CM | POA: Diagnosis not present

## 2019-08-27 NOTE — Progress Notes (Addendum)
Psychotherapy Progress Note [facility] [name], PhD LP  Patient ID: [name]     MRN: [id number] Therapy format: Individual psychotherapy Date: 08/27/2019      Start: 3:28p     Stop: 4:01p     Time Spent: 33 min Location: Telehealth visit -- I connected with this patient by an approved telecommunication method (video), with her informed consent, and verifying identity and patient privacy.  I was located at my office and patient at her home.  As needed, we discussed the limitations, risks, and security and privacy concerns associated with telehealth service, including the availability and conditions which currently govern in-person appointments and the possibility that 3rd-party payment may not be fully guaranteed and she may be responsible for charges.  After she indicated understanding, we proceeded with the session.  Also discussed treatment planning, as needed, including ongoing verbal agreement with the plan, the opportunity to ask and answer all questions, her demonstrated understanding of instructions, and her readiness to call the office should symptoms worsen or she feels she is in a crisis state and needs more immediate and tangible assistance.   Session narrative (presenting needs, interim history, self-report of stressors and symptoms, applications of prior therapy, status changes, and interventions made in session) Need to abbreviate session due to overrun.  Overwhelmed with news right now, engrossed in the [place] pullout and perspective that the president is being callous and derelict   Had colonoscopy successfully, clean results.  Determination that it was pain itself, and the stress thereof, that was causing periodic bowel bleeding.  Had an epiphany that she worried a lot for nothing about cancer, etc., decided not to worry about anything, lasted a week and a half before the news started getting her phobic about salvation again.  Worrying  about what she will do if Islamic fundamentalists come knocking on doors querying if she is a Engineer, manufacturing.  Assured this is extremely unlikely, but acknowledged the deeper meaning that she wants to know both that she is saved and that she would be worthy for having the conviction of martyrs in church history.  Reviewed the nature of OCD, the character of God, and the goal of E/RP treatment to prompt and endure uncertainty, not explain it away, chase assurances, or go through compulsive rituals to fleeting peace about it.  In evangelical terms, it is the devil's work to amp up her doubts, goad her with things she must get or do to feel OK, then make them feel inadequate and repeat.  Reminded that if she was truly outside the will of God, a loving God would make it much clearer than these unsettled feelings and worries.  Reframed living with worry as the goal, and resisting the temptation to see feeling unsettled as any kind of failure on her part, but success taking on discomfort.  Still apt to say repeatedly that she "should" be over this worry by now.  Assured it happens with repetition.  Therapeutic modalities: Cognitive Behavioral Therapy, Solution-Oriented/Positive Psychology and Faith-sensitive  Mental Status/Observations:  Appearance:   Casual     Behavior:  Appropriate  Motor:  Normal  Speech/Language:   Clear and Coherent  Affect:  Appropriate  Mood:  anxious  Thought process:  normal  Thought content:    WNL  Sensory/Perceptual disturbances:    WNL  Orientation:  Fully oriented  Attention:  Fair    Concentration:  Fair  Memory:  WNL  Insight:    Fair  Judgment:  Good  Impulse Control:  Fair   Risk Assessment: Danger to Self: No Self-injurious Behavior: No Danger to Others: No Physical Aggression / Violence: No Duty to Warn: No Access to Firearms a concern: No  Assessment of progress:  stabilized  Diagnosis:   ICD-10-CM   1. Obsessive-compulsive disorder - scrupulosity type   F42.9   2. 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 Generalized anxiety disorder  F41.1    Plan:  . Practice resistance to worry again . Be clearer about allowing uncertainty in spiritual life . Other recommendations/advice as may be noted above . Continue to utilize previously learned skills ad lib . Maintain medication as prescribed and work faithfully with relevant prescriber(s) if any changes are desired or seem indicated . Call the clinic on-call service, present to ER, or call 911 if any life-threatening psychiatric crisis Return in about 1 month (around 09/27/2019) for time as available. . Already scheduled visit in this office Visit date not found.  [name], PhD [name], PhD LP Clinical Psychologist, [facility] [address] [place], [place] [address] [phone]

## 2019-08-28 ENCOUNTER — Other Ambulatory Visit: Payer: Self-pay | Admitting: Psychiatry

## 2019-08-28 DIAGNOSIS — G2581 Restless legs syndrome: Secondary | ICD-10-CM

## 2019-09-19 DIAGNOSIS — J301 Allergic rhinitis due to pollen: Secondary | ICD-10-CM | POA: Diagnosis not present

## 2019-09-19 DIAGNOSIS — Z0000 Encounter for general adult medical examination without abnormal findings: Secondary | ICD-10-CM | POA: Diagnosis not present

## 2019-09-19 DIAGNOSIS — E7800 Pure hypercholesterolemia, unspecified: Secondary | ICD-10-CM | POA: Diagnosis not present

## 2019-09-19 DIAGNOSIS — E039 Hypothyroidism, unspecified: Secondary | ICD-10-CM | POA: Diagnosis not present

## 2019-09-19 DIAGNOSIS — R7303 Prediabetes: Secondary | ICD-10-CM | POA: Diagnosis not present

## 2019-09-19 DIAGNOSIS — H905 Unspecified sensorineural hearing loss: Secondary | ICD-10-CM | POA: Diagnosis not present

## 2019-09-19 DIAGNOSIS — G8929 Other chronic pain: Secondary | ICD-10-CM | POA: Diagnosis not present

## 2019-09-19 DIAGNOSIS — R7309 Other abnormal glucose: Secondary | ICD-10-CM | POA: Diagnosis not present

## 2019-09-19 DIAGNOSIS — R002 Palpitations: Secondary | ICD-10-CM | POA: Diagnosis not present

## 2019-09-25 DIAGNOSIS — Z6841 Body Mass Index (BMI) 40.0 and over, adult: Secondary | ICD-10-CM | POA: Diagnosis not present

## 2019-09-25 DIAGNOSIS — Z79899 Other long term (current) drug therapy: Secondary | ICD-10-CM | POA: Diagnosis not present

## 2019-09-25 DIAGNOSIS — Z9884 Bariatric surgery status: Secondary | ICD-10-CM | POA: Diagnosis not present

## 2019-09-25 DIAGNOSIS — Z7689 Persons encountering health services in other specified circumstances: Secondary | ICD-10-CM | POA: Diagnosis not present

## 2019-09-25 DIAGNOSIS — R7303 Prediabetes: Secondary | ICD-10-CM | POA: Diagnosis not present

## 2019-10-14 ENCOUNTER — Other Ambulatory Visit: Payer: Self-pay | Admitting: Psychiatry

## 2019-10-14 DIAGNOSIS — F341 Dysthymic disorder: Secondary | ICD-10-CM

## 2019-10-14 DIAGNOSIS — F429 Obsessive-compulsive disorder, unspecified: Secondary | ICD-10-CM

## 2019-10-14 DIAGNOSIS — F3340 Major depressive disorder, recurrent, in remission, unspecified: Secondary | ICD-10-CM

## 2019-10-14 DIAGNOSIS — F4010 Social phobia, unspecified: Secondary | ICD-10-CM

## 2019-10-14 DIAGNOSIS — F411 Generalized anxiety disorder: Secondary | ICD-10-CM

## 2019-10-23 DIAGNOSIS — H2511 Age-related nuclear cataract, right eye: Secondary | ICD-10-CM | POA: Diagnosis not present

## 2019-10-23 DIAGNOSIS — H25811 Combined forms of age-related cataract, right eye: Secondary | ICD-10-CM | POA: Diagnosis not present

## 2019-10-29 DIAGNOSIS — Z9884 Bariatric surgery status: Secondary | ICD-10-CM | POA: Diagnosis not present

## 2019-10-29 DIAGNOSIS — Z79899 Other long term (current) drug therapy: Secondary | ICD-10-CM | POA: Diagnosis not present

## 2019-10-29 DIAGNOSIS — Z6841 Body Mass Index (BMI) 40.0 and over, adult: Secondary | ICD-10-CM | POA: Diagnosis not present

## 2019-10-29 DIAGNOSIS — R7303 Prediabetes: Secondary | ICD-10-CM | POA: Diagnosis not present

## 2019-10-29 DIAGNOSIS — K5900 Constipation, unspecified: Secondary | ICD-10-CM | POA: Diagnosis not present

## 2019-10-29 DIAGNOSIS — Z7689 Persons encountering health services in other specified circumstances: Secondary | ICD-10-CM | POA: Diagnosis not present

## 2019-11-05 DIAGNOSIS — N952 Postmenopausal atrophic vaginitis: Secondary | ICD-10-CM | POA: Diagnosis not present

## 2019-11-05 DIAGNOSIS — Z0000 Encounter for general adult medical examination without abnormal findings: Secondary | ICD-10-CM | POA: Diagnosis not present

## 2019-11-05 DIAGNOSIS — R350 Frequency of micturition: Secondary | ICD-10-CM | POA: Diagnosis not present

## 2019-11-05 DIAGNOSIS — N6310 Unspecified lump in the right breast, unspecified quadrant: Secondary | ICD-10-CM | POA: Diagnosis not present

## 2019-11-05 DIAGNOSIS — R32 Unspecified urinary incontinence: Secondary | ICD-10-CM | POA: Diagnosis not present

## 2019-11-08 ENCOUNTER — Other Ambulatory Visit: Payer: Self-pay | Admitting: Obstetrics and Gynecology

## 2019-11-08 DIAGNOSIS — N6310 Unspecified lump in the right breast, unspecified quadrant: Secondary | ICD-10-CM

## 2019-11-13 DIAGNOSIS — H25812 Combined forms of age-related cataract, left eye: Secondary | ICD-10-CM | POA: Diagnosis not present

## 2019-11-13 DIAGNOSIS — H2512 Age-related nuclear cataract, left eye: Secondary | ICD-10-CM | POA: Diagnosis not present

## 2019-11-14 ENCOUNTER — Other Ambulatory Visit: Payer: Self-pay | Admitting: Psychiatry

## 2019-11-14 DIAGNOSIS — G2581 Restless legs syndrome: Secondary | ICD-10-CM

## 2019-12-04 DIAGNOSIS — Z79899 Other long term (current) drug therapy: Secondary | ICD-10-CM | POA: Diagnosis not present

## 2019-12-04 DIAGNOSIS — K5900 Constipation, unspecified: Secondary | ICD-10-CM | POA: Diagnosis not present

## 2019-12-04 DIAGNOSIS — Z9884 Bariatric surgery status: Secondary | ICD-10-CM | POA: Diagnosis not present

## 2019-12-04 DIAGNOSIS — Z7689 Persons encountering health services in other specified circumstances: Secondary | ICD-10-CM | POA: Diagnosis not present

## 2019-12-04 DIAGNOSIS — Z6841 Body Mass Index (BMI) 40.0 and over, adult: Secondary | ICD-10-CM | POA: Diagnosis not present

## 2019-12-04 DIAGNOSIS — R110 Nausea: Secondary | ICD-10-CM | POA: Diagnosis not present

## 2019-12-06 ENCOUNTER — Other Ambulatory Visit: Payer: Self-pay

## 2019-12-06 ENCOUNTER — Ambulatory Visit
Admission: RE | Admit: 2019-12-06 | Discharge: 2019-12-06 | Disposition: A | Discharge status: Home / Self Care | Payer: Medicare PPO | Source: Ambulatory Visit | Attending: Obstetrics and Gynecology | Admitting: Obstetrics and Gynecology

## 2019-12-06 DIAGNOSIS — R928 Other abnormal and inconclusive findings on diagnostic imaging of breast: Secondary | ICD-10-CM | POA: Diagnosis not present

## 2019-12-06 DIAGNOSIS — N6310 Unspecified lump in the right breast, unspecified quadrant: Secondary | ICD-10-CM

## 2019-12-06 DIAGNOSIS — D241 Benign neoplasm of right breast: Secondary | ICD-10-CM | POA: Diagnosis not present

## 2020-01-08 DIAGNOSIS — G2581 Restless legs syndrome: Secondary | ICD-10-CM | POA: Diagnosis not present

## 2020-01-08 DIAGNOSIS — G4730 Sleep apnea, unspecified: Secondary | ICD-10-CM | POA: Diagnosis not present

## 2020-01-08 DIAGNOSIS — M1711 Unilateral primary osteoarthritis, right knee: Secondary | ICD-10-CM | POA: Diagnosis not present

## 2020-01-08 DIAGNOSIS — M79641 Pain in right hand: Secondary | ICD-10-CM | POA: Diagnosis not present

## 2020-01-08 DIAGNOSIS — R110 Nausea: Secondary | ICD-10-CM | POA: Diagnosis not present

## 2020-01-08 DIAGNOSIS — M79642 Pain in left hand: Secondary | ICD-10-CM | POA: Diagnosis not present

## 2020-01-08 DIAGNOSIS — M180 Bilateral primary osteoarthritis of first carpometacarpal joints: Secondary | ICD-10-CM | POA: Diagnosis not present

## 2020-01-08 DIAGNOSIS — K5901 Slow transit constipation: Secondary | ICD-10-CM | POA: Diagnosis not present

## 2020-01-08 DIAGNOSIS — R7303 Prediabetes: Secondary | ICD-10-CM | POA: Diagnosis not present

## 2020-01-08 DIAGNOSIS — K219 Gastro-esophageal reflux disease without esophagitis: Secondary | ICD-10-CM | POA: Diagnosis not present

## 2020-01-08 DIAGNOSIS — E039 Hypothyroidism, unspecified: Secondary | ICD-10-CM | POA: Diagnosis not present

## 2020-01-08 DIAGNOSIS — M65331 Trigger finger, right middle finger: Secondary | ICD-10-CM | POA: Diagnosis not present

## 2020-01-08 DIAGNOSIS — M1712 Unilateral primary osteoarthritis, left knee: Secondary | ICD-10-CM | POA: Diagnosis not present

## 2020-01-13 IMAGING — MG 2D DIGITAL SCREENING BILATERAL MAMMOGRAM WITH 3D TOMO WITH CAD
8 of 12 series · 8 of 28 positions shown · non-contrast
Comparison: Previous exam(s).

CLINICAL DATA: Screening.

EXAM:
2D DIGITAL SCREENING BILATERAL MAMMOGRAM WITH 3D TOMO WITH CAD

[L MLO synth-2D]
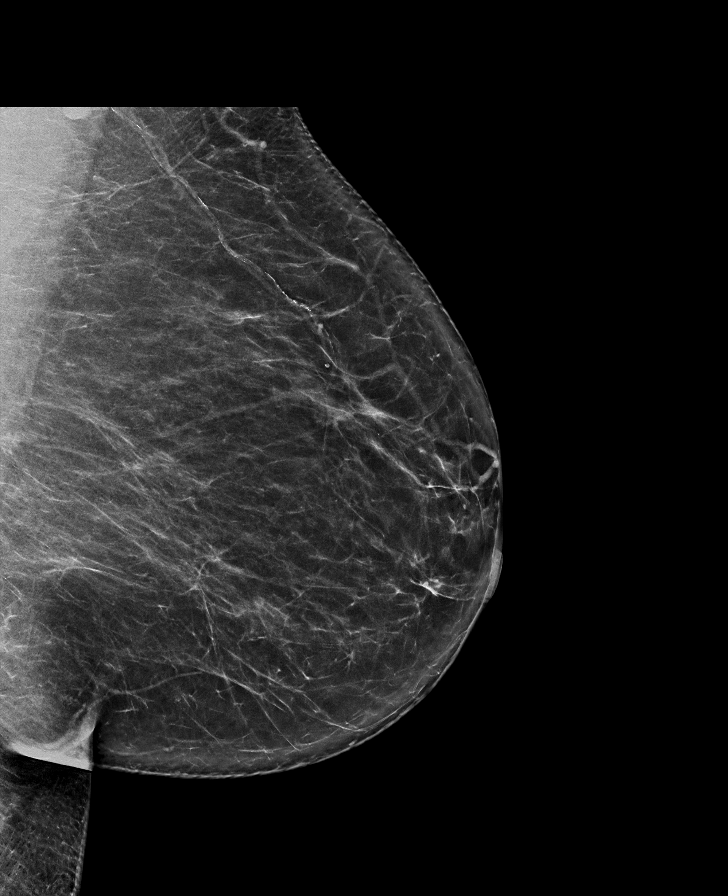

[L MLO]
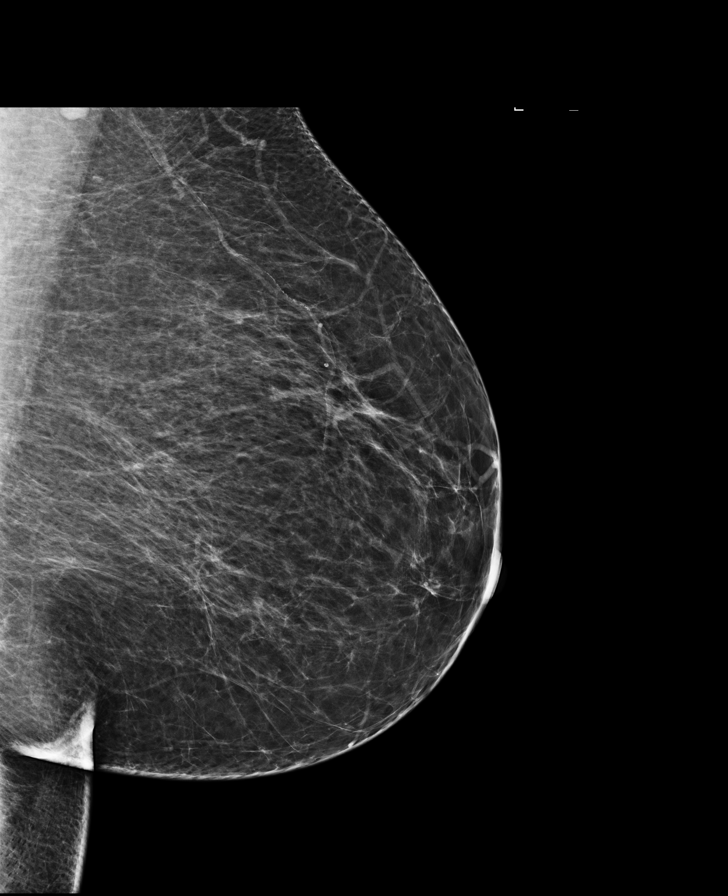

[R CC synth-2D]
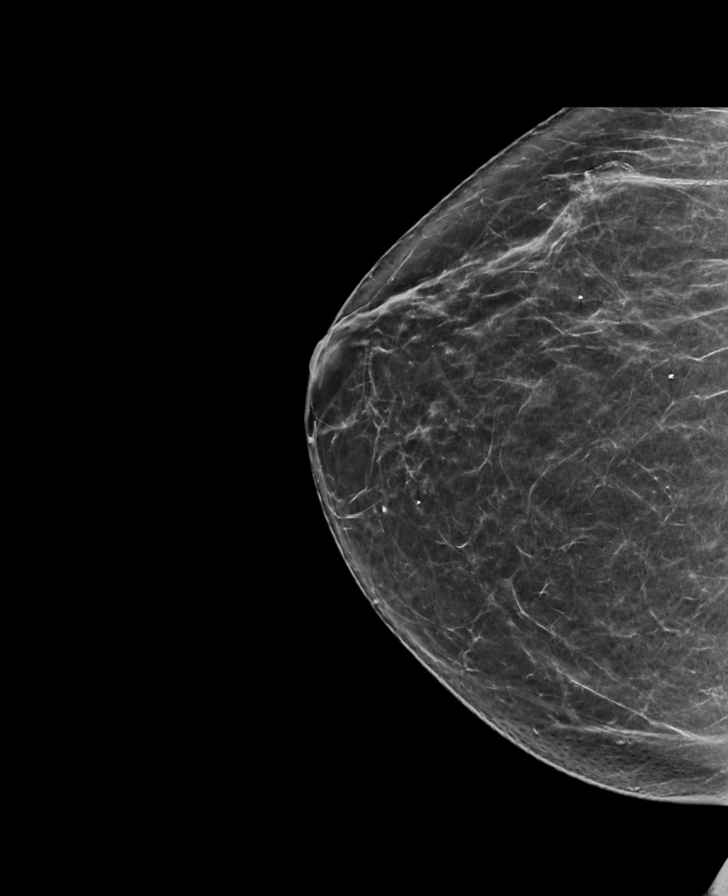

[L CC]
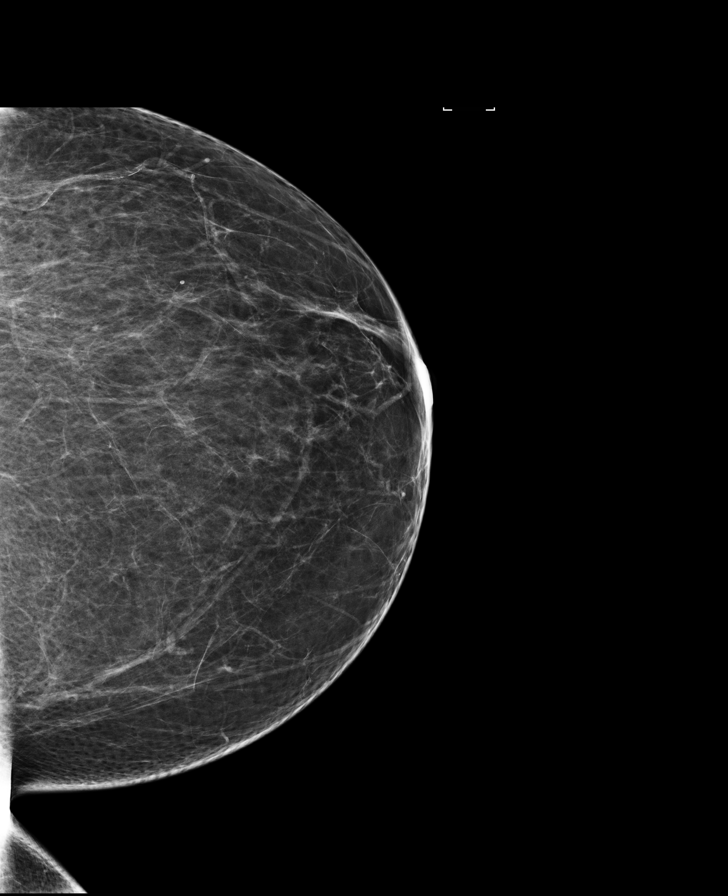

[L CC synth-2D]
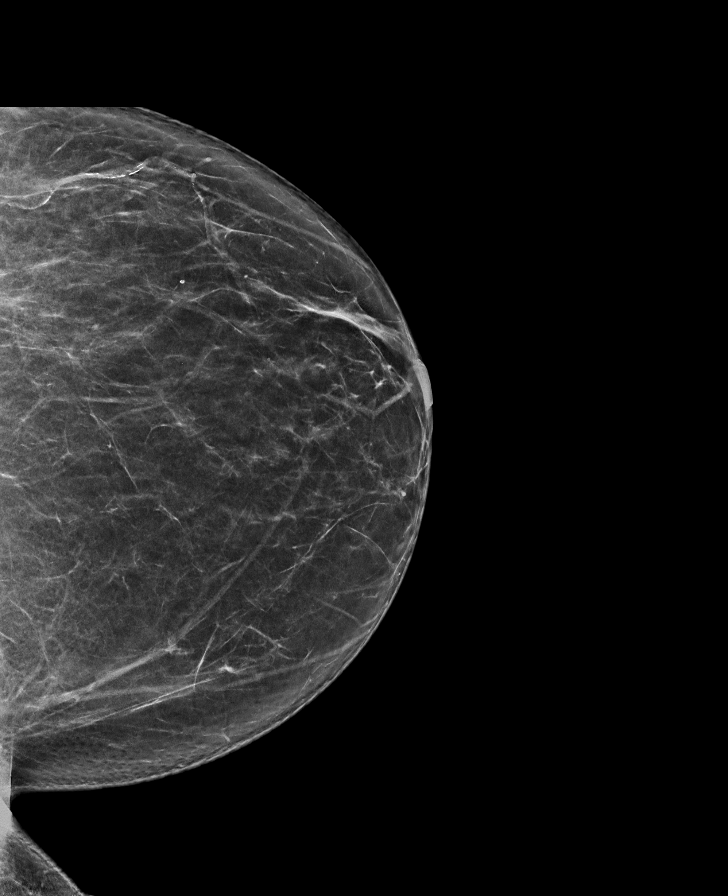

[R CC]
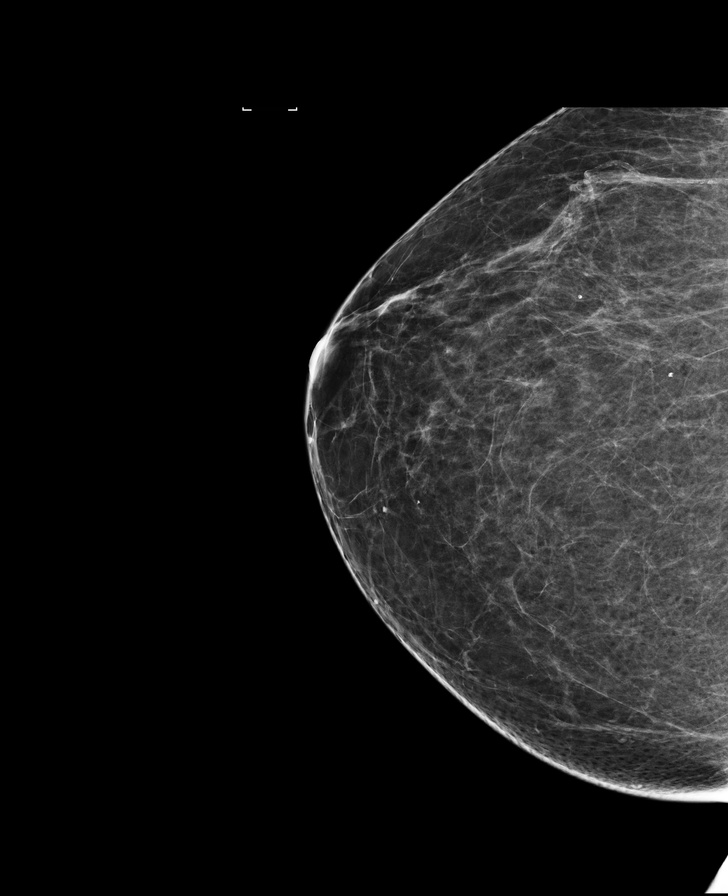

[R MLO]
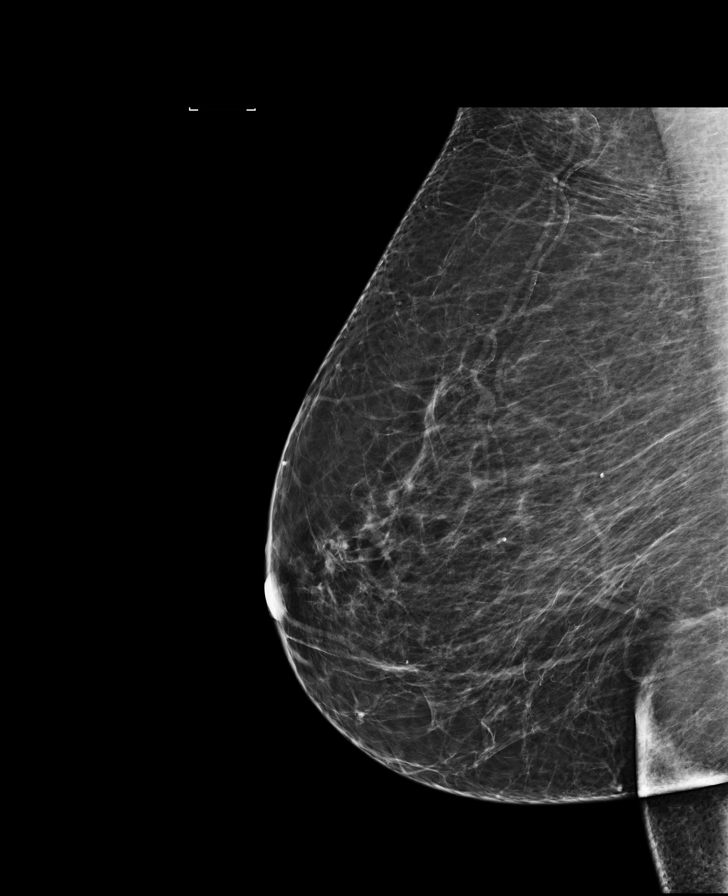

[R MLO synth-2D]
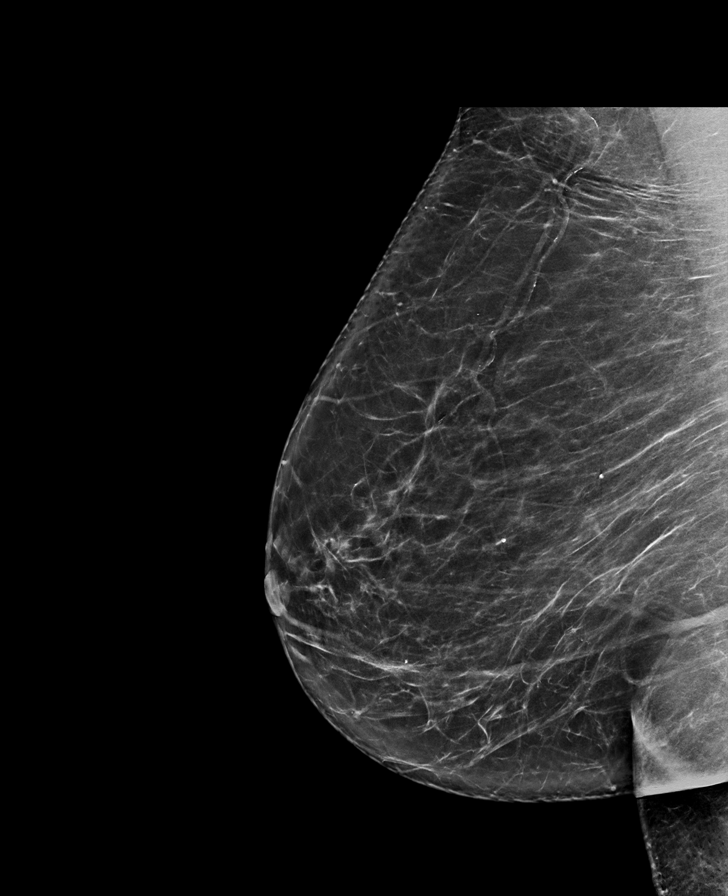

[8 of 28 positions shown; findings below may reference images not displayed]

ACR Breast Density Category b: There are scattered areas of
fibroglandular density.
FINDINGS: There are no findings suspicious for malignancy. Images were
processed with CAD.
IMPRESSION: No mammographic evidence of malignancy. A result letter of this
screening mammogram will be mailed directly to the patient.

RECOMMENDATION:
Screening mammogram in one year. (Code:[id number])

BI-RADS CATEGORY  1: Negative.

## 2020-01-27 DIAGNOSIS — L308 Other specified dermatitis: Secondary | ICD-10-CM | POA: Diagnosis not present

## 2020-01-29 ENCOUNTER — Telehealth: Payer: Self-pay | Admitting: Psychiatry

## 2020-01-29 NOTE — Telephone Encounter (Signed)
Noted - thank you for the information.

## 2020-01-29 NOTE — Telephone Encounter (Signed)
Patient called stating due to her [facility] insurance a PA will be needed for her Paxil refill. She is not in need of any at this time, but is being proactive so it want be any delays. The last fill date was 10/14/19. Patient last seen 02/15/19 with no follow up, and no concerns.

## 2020-02-03 NOTE — Telephone Encounter (Signed)
Patient is overdue for follow up needs to schedule apt

## 2020-02-12 DIAGNOSIS — Z79899 Other long term (current) drug therapy: Secondary | ICD-10-CM | POA: Diagnosis not present

## 2020-02-12 DIAGNOSIS — Z6841 Body Mass Index (BMI) 40.0 and over, adult: Secondary | ICD-10-CM | POA: Diagnosis not present

## 2020-02-12 DIAGNOSIS — Z9884 Bariatric surgery status: Secondary | ICD-10-CM | POA: Diagnosis not present

## 2020-02-12 DIAGNOSIS — Z713 Dietary counseling and surveillance: Secondary | ICD-10-CM | POA: Diagnosis not present

## 2020-02-12 DIAGNOSIS — R7303 Prediabetes: Secondary | ICD-10-CM | POA: Diagnosis not present

## 2020-03-10 DIAGNOSIS — M4316 Spondylolisthesis, lumbar region: Secondary | ICD-10-CM | POA: Diagnosis not present

## 2020-03-10 DIAGNOSIS — M546 Pain in thoracic spine: Secondary | ICD-10-CM | POA: Diagnosis not present

## 2020-03-10 DIAGNOSIS — G8929 Other chronic pain: Secondary | ICD-10-CM | POA: Diagnosis not present

## 2020-03-10 DIAGNOSIS — M5442 Lumbago with sciatica, left side: Secondary | ICD-10-CM | POA: Diagnosis not present

## 2020-03-12 ENCOUNTER — Other Ambulatory Visit: Payer: Medicare PPO | Admitting: Student

## 2020-03-12 ENCOUNTER — Other Ambulatory Visit: Payer: Self-pay | Admitting: Psychiatry

## 2020-03-12 DIAGNOSIS — G2581 Restless legs syndrome: Secondary | ICD-10-CM

## 2020-03-12 DIAGNOSIS — F411 Generalized anxiety disorder: Secondary | ICD-10-CM

## 2020-03-12 DIAGNOSIS — M546 Pain in thoracic spine: Secondary | ICD-10-CM

## 2020-03-12 DIAGNOSIS — F429 Obsessive-compulsive disorder, unspecified: Secondary | ICD-10-CM

## 2020-03-12 DIAGNOSIS — F341 Dysthymic disorder: Secondary | ICD-10-CM

## 2020-03-12 DIAGNOSIS — F3340 Major depressive disorder, recurrent, in remission, unspecified: Secondary | ICD-10-CM

## 2020-03-12 DIAGNOSIS — G8929 Other chronic pain: Secondary | ICD-10-CM

## 2020-03-12 DIAGNOSIS — F4010 Social phobia, unspecified: Secondary | ICD-10-CM

## 2020-03-18 DIAGNOSIS — Z7689 Persons encountering health services in other specified circumstances: Secondary | ICD-10-CM | POA: Diagnosis not present

## 2020-03-18 DIAGNOSIS — G2581 Restless legs syndrome: Secondary | ICD-10-CM | POA: Diagnosis not present

## 2020-03-18 DIAGNOSIS — E7800 Pure hypercholesterolemia, unspecified: Secondary | ICD-10-CM | POA: Diagnosis not present

## 2020-03-18 DIAGNOSIS — F419 Anxiety disorder, unspecified: Secondary | ICD-10-CM | POA: Diagnosis not present

## 2020-03-18 DIAGNOSIS — M7989 Other specified soft tissue disorders: Secondary | ICD-10-CM | POA: Diagnosis not present

## 2020-03-18 DIAGNOSIS — Z79899 Other long term (current) drug therapy: Secondary | ICD-10-CM | POA: Diagnosis not present

## 2020-03-18 DIAGNOSIS — Z6841 Body Mass Index (BMI) 40.0 and over, adult: Secondary | ICD-10-CM | POA: Diagnosis not present

## 2020-03-18 DIAGNOSIS — E039 Hypothyroidism, unspecified: Secondary | ICD-10-CM | POA: Diagnosis not present

## 2020-03-18 DIAGNOSIS — R7303 Prediabetes: Secondary | ICD-10-CM | POA: Diagnosis not present

## 2020-03-18 DIAGNOSIS — R002 Palpitations: Secondary | ICD-10-CM | POA: Diagnosis not present

## 2020-03-18 DIAGNOSIS — Z9884 Bariatric surgery status: Secondary | ICD-10-CM | POA: Diagnosis not present

## 2020-03-19 ENCOUNTER — Telehealth: Payer: Self-pay

## 2020-03-19 ENCOUNTER — Other Ambulatory Visit: Payer: Self-pay | Admitting: Family Medicine

## 2020-03-19 DIAGNOSIS — M7989 Other specified soft tissue disorders: Secondary | ICD-10-CM

## 2020-03-19 NOTE — Telephone Encounter (Signed)
Prior Authorization for PAROXETINE HCL 20 MG TABLET has been approved by [employer] This authorization is good until 01/02/2021.

## 2020-03-20 ENCOUNTER — Other Ambulatory Visit: Payer: Self-pay

## 2020-03-20 ENCOUNTER — Ambulatory Visit
Admission: RE | Admit: 2020-03-20 | Discharge: 2020-03-20 | Disposition: A | Discharge status: Home / Self Care | Payer: Medicare PPO | Source: Ambulatory Visit | Attending: Family Medicine | Admitting: Family Medicine

## 2020-03-20 DIAGNOSIS — M7989 Other specified soft tissue disorders: Secondary | ICD-10-CM

## 2020-03-26 ENCOUNTER — Telehealth (INDEPENDENT_AMBULATORY_CARE_PROVIDER_SITE_OTHER): Payer: Medicare PPO | Admitting: Psychiatry

## 2020-03-26 ENCOUNTER — Encounter: Payer: Self-pay | Admitting: Psychiatry

## 2020-03-26 DIAGNOSIS — F3342 Major depressive disorder, recurrent, in full remission: Secondary | ICD-10-CM | POA: Diagnosis not present

## 2020-03-26 DIAGNOSIS — F411 Generalized anxiety disorder: Secondary | ICD-10-CM

## 2020-03-26 DIAGNOSIS — G250 Essential tremor: Secondary | ICD-10-CM | POA: Diagnosis not present

## 2020-03-26 DIAGNOSIS — F4010 Social phobia, unspecified: Secondary | ICD-10-CM | POA: Diagnosis not present

## 2020-03-26 DIAGNOSIS — F341 Dysthymic disorder: Secondary | ICD-10-CM

## 2020-03-26 DIAGNOSIS — G2581 Restless legs syndrome: Secondary | ICD-10-CM

## 2020-03-26 DIAGNOSIS — F4001 Agoraphobia with panic disorder: Secondary | ICD-10-CM | POA: Diagnosis not present

## 2020-03-26 DIAGNOSIS — F429 Obsessive-compulsive disorder, unspecified: Secondary | ICD-10-CM | POA: Diagnosis not present

## 2020-03-26 DIAGNOSIS — F3340 Major depressive disorder, recurrent, in remission, unspecified: Secondary | ICD-10-CM

## 2020-03-26 MED ORDER — PROPRANOLOL HCL ER 80 MG PO CP24: 80.0000 mg | ORAL_CAPSULE | Freq: Every day | ORAL | 0 refills | Status: DC

## 2020-03-26 MED ORDER — ALPRAZOLAM 0.5 MG PO TABS: 0.2500 mg | ORAL_TABLET | Freq: Three times a day (TID) | ORAL | 3 refills | Status: DC | PRN

## 2020-03-26 MED ORDER — PRAMIPEXOLE DIHYDROCHLORIDE 0.25 MG PO TABS: ORAL_TABLET | ORAL | 3 refills | Status: DC

## 2020-03-26 MED ORDER — PAROXETINE HCL 20 MG PO TABS: ORAL_TABLET | ORAL | 3 refills | Status: DC

## 2020-03-26 NOTE — Progress Notes (Signed)
[name] [id number] 02-27-1955 65 y.o.  Virtual Visit via [place]  I connected with pt by WebEx and verified that I am speaking with the correct person using two identifiers.   I discussed the limitations, risks, security and privacy concerns of performing an evaluation and management service by Jack[facility]ul[facility] and the availability of in person appointments. I also discussed with the patient that there may be a patient responsible charge related to this service. The patient expressed understanding and agreed to proceed.  I discussed the assessment and treatment plan with the patient. The patient was provided an opportunity to ask questions and all were answered. The patient agreed with the plan and demonstrated an understanding of the instructions.   The patient was advised to call back or seek an in-person evaluation if the symptoms worsen or if the condition fails to improve as anticipated.  I provided 30 minutes of video time during this encounter. The patient was located at home and the provider was located office.  Session starte 130 and ended at 200.   Subjective:   Patient ID:  [name] is a 65 y.o. (DOB January 03, 1956) female.  Chief Complaint:  Chief Complaint  Patient presents with  . Follow-up  . Obsessive-compulsive disorder, unspecified type  . Anxiety  . Depression    HPI [name] presents for follow-up of treatment resistant OCD, major depression, and restless leg syndrome.  seen May 2019.  She had accidentally reduced her paroxetine from 60 mg to 40 mg and it was increased back to the appropriate 60 mg dose.  She never increased the dosage.  seen May 2020.  She continued on paroxetine 40 mg a day.  Her anxiety level was high.  We discussed increasing it back to 60 mg a day which she had taken in the past.  She indicated a willingness to do so.  This had been discussed at the previous appointment as well but she never made the change.   As of  Feb 2021, more anxiety.  September fusions back surgery.  Created lots of anxiety.   More scary thoughts and more OCD.  Covid triggers obsessive fears about the end of times biblically.   Didn't increase paroxetine bc afraid she will lose options for future fear of worsening options.  "I'm saving it."   More down over the back surgery and immobility.  Depression was OK otherwise.  Been doing pretty good for the most part, but more anxious lately.  Nervous facing knee and possible back surgery.  Also working on obsessions with Dr. Rica Mote.  She wonders about increasing the Paxil.  She never increased the Paxil to 60 mg daily.  Disc pros and cons of this.  Needing more Xanax but it's not a high dosage.. Plan: increase in paroxetine to 60 mg daily.  03/26/2020 appointment with following noted: Unusually patient has not been seen in over a year. Pretty good for the most part.  Still has triggers for OCD like yesterday memorial service but can pull herself out of it.Still has the OCD.  Easier to dismiss the obsessions and anxiety is better. Increase paroxetine helped her.  Recognizes benefit from the meds.  Stopped counseling and feels OK about it.  Much better than a year ago. Takes Xanax but not lately.  At least 2 weeks. Has tremor off and on and can affect handwriting.  Sisters & mother have tremor also. Chronic back pain and accepted it.  Taking propranolol ER 60 for anxiety.  Patient reports stable mood and denies depressed or irritable moods.  She has a history of severe depression but that is under control.  Patient denies difficulty with sleep initiation or maintenance. Denies appetite disturbance.  Patient reports that energy and motivation have been good.  Patient denies any difficulty with concentration.  Patient denies any suicidal ideation.  Past Psychiatric Medication Trials: Paroxetine, venlafaxine, clomipramine, Lexapro 40 no response, sertraline, duloxetine, fluvoxamine,  Wellbutrin 300  panic, Viibryd, Fetzima,  Abilify, Risperdal, Geodon 40 twice daily side effects, Seroquel, Latuda, Rexulti, Saphris,  N-acetylcysteine, Deplin, clonidine, pindolol, gabapentin SI, lamotrigine, buspirone, pramipexole, Xanax  Review of Systems:  Review of Systems  Cardiovascular: Negative for palpitations.  Musculoskeletal: Positive for back pain, gait problem and joint swelling.  Neurological: Negative for tremors and weakness.    Medications: I have reviewed the patient's current medications.  Current Outpatient Medications  Medication Sig Dispense Refill  . acetaminophen (TYLENOL) 500 MG tablet Take 1,000 mg by mouth every 6 (six) hours as needed (pain.).    Marland Kitchen amoxicillin (AMOXIL) 500 MG capsule Take 2,000 mg by mouth See admin instructions. Take 4 capsules (2000 mg) by mouth 1 hour prior to dental procedure.    . Azelaic Acid 15 % cream Apply 1 application topically 2 (two) times daily. Facial Gel    . b complex vitamins capsule Take 1 capsule by mouth daily.    . Cholecalciferol (VITAMIN D-3) 5000 UNITS TABS Take 5,000 Units by mouth daily.    . cyclobenzaprine (FLEXERIL) 10 MG tablet Take 10 mg by mouth 3 (three) times daily as needed for muscle spasms.    Marland Kitchen ezetimibe (ZETIA) 10 MG tablet Take 10 mg by mouth daily.     . fexofenadine (ALLEGRA) 180 MG tablet Take 180 mg by mouth daily as needed for allergies.     . fluticasone (FLONASE) 50 MCG/ACT nasal spray Place 1-2 sprays into both nostrils daily as needed for allergies or rhinitis.    Marland Kitchen HYDROcodone-acetaminophen (NORCO/VICODIN) 5-325 MG tablet Take 1 tablet by mouth every 6 (six) hours as needed for moderate pain.    Marland Kitchen levothyroxine (SYNTHROID) 88 MCG tablet Take 88 mcg by mouth daily before breakfast.    . NON FORMULARY Apply 1 application topically as needed (pain.). Deep Blue essential oil blend    . Pediatric Multivitamins-Iron (FLINTSTONES COMPLETE PO) Take 1 tablet by mouth 2 (two) times daily.    . Semaglutide,0.25 or  0.5MG /DOS, (OZEMPIC, 0.25 OR 0.5 MG/DOSE,) 2 MG/1.5ML SOPN Inject into the skin. 100 units    . Zinc 50 MG TABS Take 25 mg by mouth daily.    Marland Kitchen ALPRAZolam (XANAX) 0.5 MG tablet Take 0.5 tablets (0.25 mg total) by mouth 3 (three) times daily as needed for anxiety. 90 tablet 3  . HYDROmorphone (DILAUDID) 2 MG tablet Take 2-4 mg by mouth every 6 (six) hours as needed for severe pain (pain.).  (Patient not taking: Reported on 03/26/2020)    . methocarbamol (ROBAXIN) 750 MG tablet Take 750 mg by mouth every 6 (six) hours as needed for muscle spasms.  (Patient not taking: Reported on 03/26/2020)  2  . OIL BASE EX Apply 1 application topically as needed (pain.). [employer] Deep Relief (Patient not taking: Reported on 03/26/2020)    . oxyCODONE-acetaminophen (PERCOCET/ROXICET) 5-325 MG tablet Take 1 tablet by mouth every 6 (six) hours as needed for severe pain (pain.). (Patient not taking: Reported on 03/26/2020)    . PARoxetine (PAXIL) 20 MG tablet TAKE 1 TABLET  BY MOUTH IN THE MORNING, AT NOON, AND AT BEDTIME 270 tablet 3  . peppermint oil liquid Apply 1 application topically as needed (pain.). Peppermint essential oil (Patient not taking: Reported on 03/26/2020)    . pramipexole (MIRAPEX) 0.25 MG tablet TAKE 1 TABLET BY MOUTH EVERY MORNING AND1 AND 1/2 TABS AT NIGHT 225 tablet 3  . propranolol ER (INDERAL LA) 80 MG 24 hr capsule Take 1 capsule (80 mg total) by mouth daily. 90 capsule 0   No current facility-administered medications for this visit.    Medication Side Effects: None  Allergies:  Allergies  Allergen Reactions  . Adhesive [Tape] Other (See Comments)    Blisters with tape after surgical procedures  . Morphine And Related Other (See Comments)    Feels like bugs crawling over her  . Nsaids Hives  . Buspar [Buspirone] Palpitations  . Ciprofloxacin Nausea Only    Upset stomach   . Codeine Other (See Comments)    Hyper  . Gabapentin Other (See Comments)    Suicidal ideation.  . Lodine  [Etodolac] Nausea Only    Upset Stomach   . Lyrica [Pregabalin] Other (See Comments)    suicidal ideation  . Niacin And Related Other (See Comments)    flushing  . Pravastatin Other (See Comments)    achiness   . Septra [Sulfamethoxazole-Trimethoprim] Other (See Comments)    Stomach pains  . Tylox [Oxycodone-Acetaminophen] Other (See Comments)    Hyper  . Wellbutrin [Bupropion] Other (See Comments)    dizzy    Past Medical History:  Diagnosis Date  . Anginal pain ([place])    "on and off" (11/01/2011) - anxiety related  . Anxiety    takes Paxil daily and Xanax prn  . Arthritis    "knees" (10/16/2013)  . Chronic back pain    facet disease and bulging disc; "thoracic and lower back" (10/16/2013)  . Constipation    r/t pain meds;takes stool softener daily  . Depression   . Dysrhythmia    rapid HR on occasion-takes Propranolol daily  . Family history of anesthesia complication    ":PONV; mom and sisters"  . GERD (gastroesophageal reflux disease)    takes Prilosec   . H/O hiatal hernia   . History of bladder infections    > 9yr ago  . History of kidney stones   . Hyperlipidemia    takes LIpitor daily  . Hypothyroidism    takes Synthroid daily  . Insomnia   . Joint pain   . Joint swelling   . Muscle spasms of head or neck    lumbar and thoracic;takes Robaxin prn  . Peripheral neuropathy   . PONV (postoperative nausea and vomiting)   . Rosacea   . Seasonal allergies    takes allegra prn  . Sleep apnea     Family History  Problem Relation Age of Onset  . Heart disease Mother 46  . Heart failure Mother   . Heart attack Father   . Heart disease Father 46    Social History   Socioeconomic History  . Marital status: Married    Spouse name: Not on file  . Number of children: Not on file  . Years of education: Not on file  . Highest education level: Not on file  Occupational History  . Not on file  Tobacco Use  . Smoking status: Never Smoker  . Smokeless  tobacco: Never Used  Vaping Use  . Vaping Use: Never used  Substance and Sexual  Activity  . Alcohol use: No  . Drug use: No  . Sexual activity: Yes    Birth control/protection: Surgical  Other Topics Concern  . Not on file  Social History Narrative  . Not on file   Social Determinants of Health   Financial Resource Strain: Not on file  Food Insecurity: Not on file  Transportation Needs: Not on file  Physical Activity: Not on file  Stress: Not on file  Social Connections: Not on file  Intimate Partner Violence: Not on file    Past Medical History, Surgical history, Social history, and Family history were reviewed and updated as appropriate.   Please see review of systems for further details on the patient's review from today.   Objective:   Physical Exam:  There were no vitals taken for this visit.  Physical Exam Neurological:     Mental Status: She is alert and oriented to person, place, and time.     Cranial Nerves: No dysarthria.  Psychiatric:        Attention and Perception: Attention normal.        Mood and Affect: Mood is anxious. Mood is not depressed.        Speech: Speech normal.        Behavior: Behavior is cooperative.        Thought Content: Thought content normal. Thought content is not paranoid or delusional. Thought content does not include homicidal or suicidal ideation. Thought content does not include homicidal or suicidal plan.        Cognition and Memory: Cognition and memory normal.        Judgment: Judgment normal.     Comments: Residual sig obsessions and comnpulsive prayer but much better than last year.     Lab Review:     Component Value Date/Time   NA 139 10/16/2018 0558   NA 141 06/15/2018 1411   K 4.1 10/16/2018 0558   CL 106 10/16/2018 0558   CO2 20 (L) 10/16/2018 0558   GLUCOSE 157 (H) 10/16/2018 0558   BUN 16 10/16/2018 0558   BUN 13 06/15/2018 1411   CREATININE 0.98 10/16/2018 0558   CALCIUM 8.5 (L) 10/16/2018 0558   PROT  7.7 10/08/2013 1050   ALBUMIN 3.9 10/08/2013 1050   AST 23 10/08/2013 1050   ALT 17 10/08/2013 1050   ALKPHOS 72 10/08/2013 1050   BILITOT 0.4 10/08/2013 1050   GFRNONAA >60 10/16/2018 0558   GFRAA >60 10/16/2018 0558       Component Value Date/Time   WBC 13.4 (H) 10/16/2018 0558   RBC 3.45 (L) 10/16/2018 0558   HGB 11.0 (L) 10/16/2018 0558   HCT 33.2 (L) 10/16/2018 0558   PLT 258 10/16/2018 0558   MCV 96.2 10/16/2018 0558   MCH 31.9 10/16/2018 0558   MCHC 33.1 10/16/2018 0558   RDW 13.6 10/16/2018 0558   LYMPHSABS 1,672 01/25/2018 1202   MONOABS 0.5 10/08/2013 1050   EOSABS 149 01/25/2018 1202   BASOSABS 61 01/25/2018 1202    No results found for: POCLITH, LITHIUM   No results found for: PHENYTOIN, PHENOBARB, VALPROATE, CBMZ   .res Assessment: Plan:    Obsessive-compulsive disorder - scrupulosity type  Generalized anxiety disorder - Plan: propranolol ER (INDERAL LA) 80 MG 24 hr capsule, PARoxetine (PAXIL) 20 MG tablet, ALPRAZolam (XANAX) 0.5 MG tablet  Panic disorder with agoraphobia  Recurrent major depression in complete remission (HCC)  Social anxiety disorder - Plan: propranolol ER (INDERAL LA) 80 MG 24 hr capsule,  PARoxetine (PAXIL) 20 MG tablet  Restless legs syndrome - Plan: pramipexole (MIRAPEX) 0.25 MG tablet  Obsessive-compulsive disorder, unspecified type - Plan: PARoxetine (PAXIL) 20 MG tablet  Recurrent major depressive disorder, in remission ([place]) - Plan: PARoxetine (PAXIL) 20 MG tablet  Early onset dysthymia - Plan: PARoxetine (PAXIL) 20 MG tablet  Benign essential tremor - Plan: propranolol ER (INDERAL LA) 80 MG 24 hr capsule   Greater than 50% of 30 min of non face to face time with patient was spent on counseling and coordination of care. We discussed She overall has been satisfied with the meds in spite of being on a relatively low dose of paroxetine 40 mg given her history of treatment resistant OCD.  She is making progress in therapy that is  helping.  She asks about possibly increasing the paroxetine back to 60 mg which she is taken before given the additional stressors of pending surgery.  Obviously I think that is a reasonable thing to do.     That may also decrease her need for the extra Xanax that she has been taking lately.  There is no evidence for any Xanax abuse or overuse or misuse nor tolerance nor dependence. We discussed the short-term risks associated with benzodiazepines including sedation and increased fall risk among others.  Discussed long-term side effect risk including dependence, potential withdrawal symptoms, and the potential eventual dose-related risk of dementia.  Xanax is OK with Dilaudid at dosages prescribed but don't exceed dosages.  She has not abused meds in the past.  Propranolol is also helping her anxiety and she's satisfied but wants to increase for her tremor.  Her restless legs is managed as long as she takes the pramipexole.  Dressed her fears about the Covid vaccine and encouraged her to get the vaccine.   Explained nature of way SSRI work for anxiety and how higher dose can improve her anxiety over months and possibly longer.  Big advantage to increasing the dose.  Discussed this in depth including side effects. No med changes today, better with the increase in paroxetine to 60 mg daily. Pt needs high dosage bc of TR OCD.  This is an appropriate dose for OCD and typical and tolerated.  Follow-up 6 months  Kristin Parents, MD, DFAPA.  Future Appointments  Date Time Provider [place]  04/04/2020 11:30 AM [facility] MR 1 [facility]  04/04/2020 12:10 PM [facility] MR 1 [facility]    No orders of the defined types were placed in this encounter.     -------------------------------

## 2020-04-01 ENCOUNTER — Ambulatory Visit: Payer: Medicare PPO | Admitting: Orthopaedic Surgery

## 2020-04-04 ENCOUNTER — Ambulatory Visit
Admission: RE | Admit: 2020-04-04 | Discharge: 2020-04-04 | Disposition: A | Discharge status: Home / Self Care | Payer: Medicare PPO | Source: Ambulatory Visit | Attending: Student | Admitting: Student

## 2020-04-04 ENCOUNTER — Other Ambulatory Visit: Payer: Self-pay

## 2020-04-04 DIAGNOSIS — G8929 Other chronic pain: Secondary | ICD-10-CM

## 2020-04-04 DIAGNOSIS — M546 Pain in thoracic spine: Secondary | ICD-10-CM

## 2020-04-04 DIAGNOSIS — M25512 Pain in left shoulder: Secondary | ICD-10-CM | POA: Diagnosis not present

## 2020-04-04 DIAGNOSIS — M25511 Pain in right shoulder: Secondary | ICD-10-CM | POA: Diagnosis not present

## 2020-04-04 DIAGNOSIS — M5124 Other intervertebral disc displacement, thoracic region: Secondary | ICD-10-CM | POA: Diagnosis not present

## 2020-04-04 DIAGNOSIS — M5442 Lumbago with sciatica, left side: Secondary | ICD-10-CM

## 2020-04-04 DIAGNOSIS — D1809 Hemangioma of other sites: Secondary | ICD-10-CM | POA: Diagnosis not present

## 2020-04-04 DIAGNOSIS — M48061 Spinal stenosis, lumbar region without neurogenic claudication: Secondary | ICD-10-CM | POA: Diagnosis not present

## 2020-04-04 DIAGNOSIS — M5136 Other intervertebral disc degeneration, lumbar region: Secondary | ICD-10-CM | POA: Diagnosis not present

## 2020-04-04 MED ORDER — GADOBENATE DIMEGLUMINE 529 MG/ML IV SOLN
20.0000 mL | Freq: Once | INTRAVENOUS | Status: AC | PRN
  Administered 2020-04-04: 20 mL via INTRAVENOUS

## 2020-04-15 DIAGNOSIS — Z981 Arthrodesis status: Secondary | ICD-10-CM | POA: Diagnosis not present

## 2020-04-15 DIAGNOSIS — M4316 Spondylolisthesis, lumbar region: Secondary | ICD-10-CM | POA: Diagnosis not present

## 2020-04-15 DIAGNOSIS — M48061 Spinal stenosis, lumbar region without neurogenic claudication: Secondary | ICD-10-CM | POA: Diagnosis not present

## 2020-04-21 ENCOUNTER — Encounter: Payer: Self-pay | Admitting: Orthopaedic Surgery

## 2020-04-21 ENCOUNTER — Telehealth: Payer: Self-pay

## 2020-04-21 ENCOUNTER — Other Ambulatory Visit: Payer: Self-pay

## 2020-04-21 ENCOUNTER — Ambulatory Visit: Payer: Self-pay

## 2020-04-21 ENCOUNTER — Ambulatory Visit: Payer: Medicare PPO | Admitting: Orthopaedic Surgery

## 2020-04-21 ENCOUNTER — Ambulatory Visit (INDEPENDENT_AMBULATORY_CARE_PROVIDER_SITE_OTHER): Payer: Medicare PPO

## 2020-04-21 DIAGNOSIS — G8929 Other chronic pain: Secondary | ICD-10-CM | POA: Diagnosis not present

## 2020-04-21 DIAGNOSIS — M25562 Pain in left knee: Secondary | ICD-10-CM

## 2020-04-21 DIAGNOSIS — M170 Bilateral primary osteoarthritis of knee: Secondary | ICD-10-CM | POA: Diagnosis not present

## 2020-04-21 DIAGNOSIS — M25561 Pain in right knee: Secondary | ICD-10-CM

## 2020-04-21 DIAGNOSIS — Z96652 Presence of left artificial knee joint: Secondary | ICD-10-CM | POA: Diagnosis not present

## 2020-04-21 NOTE — Progress Notes (Signed)
Office Visit Note   Patient: [name]           Date of Birth: 10/25/1955           MRN: [id number] Visit Date: 04/21/2020              Requested by: Shirline Frees, MD [place],  [place] [id number] PCP: Shirline Frees, MD   Assessment & Plan: Visit Diagnoses:  1. Chronic pain of both knees   2. Primary osteoarthritis of both knees   3. Chronic pain of left knee   4. 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  The right knee was replaced in 2015.  A revision left total knee replacement was performed in 2013 for loosening of the components.  Prior to that she had had patellectomies..  She has had a number of medical issues including morbid obesity and is status post gastric sleeve procedure.  She has had an L4 to the sacrum fusion by Dr. Arnoldo Morale.  She is on a host of medicines for varying medical problems.  She notes she is not diabetic.  I saw her about a year and a half ago for similar pain and she relates that it did not seem to be any better.'s pain seems to be somewhat nondescript and somewhere in the vicinity of both of her knees.  She is not sure that she is experiencing claudication.  She has developed some degenerative changes above her fusion at L3-4 which may or may not be an issue.  Films of her knee a year to year and a half ago were fine without evidence of any complications.  Films today were no different either.  I am going to order CBC sed rate C-reactive protein and three-phase bone scan for both of her knees.  Based on her exam and films I cannot find an obvious cause of her problem related to her knees.  She does have chronic back issues which certainly could cause her to have some discomfort and radiating pain and even weakness of the thigh musculature.  Future considerations would be physical therapy for strengthening and may be even EMGs and nerve conduction  studies  Follow-Up Instructions: No follow-ups on file.   Orders:  Orders Placed This Encounter  Procedures  . XR KNEE 3 VIEW LEFT  . XR KNEE 3 VIEW RIGHT   No orders of the defined types were placed in this encounter.     Procedures: No procedures performed   Clinical Data: No additional findings.   Subjective: Chief Complaint  Patient presents with  . Right Knee - Pain  . Left Knee - Pain  Patient presents today for bilateral knee pain. The right is worse than the left. She said that she was seen for this about a year ago. She has a history of bilateral knee replacements. She said that both knees feel weak. The right knee swells. She has been taking pain medicine that was prescribed for her back pain.  Denies fever or chills or recent injury or trauma.  Still having some pain about both of her knees.  HPI  Review of Systems   Objective: Vital Signs: There were no vitals taken for this visit.  Physical Exam Constitutional:      Appearance: She is well-developed.  Pulmonary:     Effort: Pulmonary effort is normal.  Skin:    General: Skin is warm  and dry.  Neurological:     Mental Status: She is alert and oriented to person, place, and time.  Psychiatric:        Behavior: Behavior normal.     Ortho Exam awake alert and oriented x3.  Comfortable sitting.  Has gained about 40 pounds from her prior gastric sleeve at 1 time she was 214 and now is about to 1.  Straight leg raise was negative.  Painless range of motion both of her hips.  She did not appear to have any instability of either knee.  The knees were not hot warm red and I do not think she had any effusions.  She had full extension and can flex over 100 degrees.  She opens a little bit medially and laterally on the right knee but not excessively.  Her left knee felt perfectly stable.  Has a number of areas of localized tenderness about her left knee.  She has had a prior history of a neuroma from one of her old  incisions medially in the left knee which is causing her some chronic pain.  She does have history of neuropathy in both lower extremities but the skin was nice and warm.  Motor exam appears to be intact Specialty Comments:  No specialty comments available.  Imaging: No results found.   PMFS History: Patient Active Problem List   Diagnosis Date Noted  . Knee pain, bilateral 04/21/2020  . Spondylolisthesis of lumbar region 10/15/2018  . 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 Obsessive-compulsive disorder 10/10/2017  . Generalized anxiety disorder 10/10/2017  . Major depressive disorder, recurrent episode, moderate ([place]) 10/10/2017  . Hiatal hernia 03/03/2015  . Steatosis of liver 09/16/2014  . Obstructive sleep apnea syndrome 08/27/2014  . Pre-op evaluation 05/14/2014  . Osteoarthritis of left knee 10/17/2013  . History of left knee replacement 10/15/2013  . Hypercholesterolemia 08/05/2013  . Palpitations 08/05/2013  . HTN (hypertension) 08/05/2013  . 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 Preoperative cardiovascular examination 08/05/2013  . Degenerative arthritis of knee, bilateral 08/05/2013  . Chronic pain of left knee 11/03/2011  . Morbid obesity ([place]) 11/03/2011  . Hypothyroid 11/03/2011   Past Medical History:  Diagnosis Date  . Anginal pain ([place])    "on and off" (11/01/2011) - anxiety related  . Anxiety    takes Paxil daily and Xanax prn  . Arthritis    "knees" (10/16/2013)  . Chronic back pain    facet disease and bulging disc; "thoracic and lower back" (10/16/2013)  . Constipation    r/t pain meds;takes stool softener daily  . Depression   . Dysrhythmia    rapid HR on occasion-takes Propranolol daily  . Family history of anesthesia complication    ":PONV; mom and sisters"  . GERD (gastroesophageal reflux disease)    takes Prilosec   . H/O hiatal hernia   . History of bladder infections    > 11yr ago  . History  of kidney stones   . Hyperlipidemia    takes LIpitor daily  . Hypothyroidism    takes Synthroid daily  . Insomnia   . Joint pain   . Joint swelling   . Muscle spasms of head or neck    lumbar and thoracic;takes Robaxin prn  . Peripheral neuropathy   . PONV (postoperative nausea and vomiting)   . Rosacea   . Seasonal allergies    takes allegra prn  . Sleep apnea     Family  History  Problem Relation Age of Onset  . Heart disease Mother 57  . Heart failure Mother   . Heart attack Father   . Heart disease Father 26    Past Surgical History:  Procedure Laterality Date  . ABDOMINAL HYSTERECTOMY  1980's  . ANAL FISSURE REPAIR  ~ 2011   "banded hemorrhoids at this time too"  . BARIATRIC SURGERY    . CARDIAC CATHETERIZATION  ~ 1999  . CARPAL TUNNEL RELEASE Left 09/13/2017   & ulnar nerve release  . CERVICAL DISC SURGERY  03/2017   C5, C6   . COLONOSCOPY    . ESOPHAGOGASTRODUODENOSCOPY    . JOINT REPLACEMENT     left knee  . KNEE ARTHROPLASTY Left 2007  . KNEE ARTHROSCOPY Right   . KNEE ARTHROSCOPY Right    "torn meniscus"  . KNEE SURGERY Left 1982-2013   "17 before 10/31/2013"  . LEFT OOPHORECTOMY  1980's  . PATELLECTOMY Right   . PLANTAR FASCIA SURGERY Bilateral 1990's  . REVISION TOTAL KNEE ARTHROPLASTY Left 11/01/2011  . TOTAL KNEE ARTHROPLASTY Right 10/15/2013   Procedure: RIGHT TOTAL KNEE ARTHROPLASTY;  Surgeon: Garald Balding, MD;  Location: [facility];  Service: Orthopedics;  Laterality: Right;  . TOTAL KNEE REVISION  11/01/2011   Procedure: TOTAL KNEE REVISION;  Surgeon: Garald Balding, MD;  Location: [facility];  Service: Orthopedics;  Laterality: Left;  Revision Left Total Knee Replacement   . TUBAL LIGATION  1981   Social History   Occupational History  . Not on file  Tobacco Use  . Smoking status: Never Smoker  . Smokeless tobacco: Never Used  Vaping Use  . Vaping Use: Never used  Substance and Sexual Activity  . Alcohol use: No  . Drug use: No  .  Sexual activity: Yes    Birth control/protection: Surgical

## 2020-04-21 NOTE — Telephone Encounter (Signed)
Spoke with patient and moved appointment to today.

## 2020-04-21 NOTE — Telephone Encounter (Signed)
Patient called she has a appointment next week she is requesting to be worked into Dr.[email or web address] scheduled if possible call back:[phone]

## 2020-04-22 LAB — CBC WITH DIFFERENTIAL/PLATELET
Absolute Monocytes: 687 cells/uL (ref 200–950)
Basophils Absolute: 48 cells/uL (ref 0–200)
Basophils Relative: 0.7 %
Eosinophils Absolute: 129 cells/uL (ref 15–500)
Eosinophils Relative: 1.9 %
HCT: 39.5 % (ref 35.0–45.0)
Hemoglobin: 13 g/dL (ref 11.7–15.5)
Lymphs Abs: 2217 cells/uL (ref 850–3900)
MCH: 31.9 pg (ref 27.0–33.0)
MCHC: 32.9 g/dL (ref 32.0–36.0)
MCV: 97.1 fL (ref 80.0–100.0)
MPV: 9.2 fL (ref 7.5–12.5)
Monocytes Relative: 10.1 %
Neutro Abs: 3720 cells/uL (ref 1500–7800)
Neutrophils Relative %: 54.7 %
Platelets: 337 10*3/uL (ref 140–400)
RBC: 4.07 10*6/uL (ref 3.80–5.10)
RDW: 13.1 % (ref 11.0–15.0)
Total Lymphocyte: 32.6 %
WBC: 6.8 10*3/uL (ref 3.8–10.8)

## 2020-04-22 LAB — SEDIMENTATION RATE: Sed Rate: 31 mm/h — ABNORMAL HIGH (ref 0–30)

## 2020-04-22 LAB — C-REACTIVE PROTEIN: CRP: 0.4 mg/L (ref <8.0)

## 2020-04-28 ENCOUNTER — Ambulatory Visit: Payer: Medicare PPO | Admitting: Orthopaedic Surgery

## 2020-04-30 ENCOUNTER — Encounter (HOSPITAL_COMMUNITY)
Admission: RE | Admit: 2020-04-30 | Discharge: 2020-04-30 | Disposition: A | Discharge status: Home / Self Care | Payer: Medicare PPO | Source: Ambulatory Visit | Attending: Orthopaedic Surgery | Admitting: Orthopaedic Surgery

## 2020-04-30 ENCOUNTER — Other Ambulatory Visit: Payer: Self-pay

## 2020-04-30 DIAGNOSIS — M25562 Pain in left knee: Secondary | ICD-10-CM | POA: Diagnosis not present

## 2020-04-30 DIAGNOSIS — Z96652 Presence of left artificial knee joint: Secondary | ICD-10-CM | POA: Insufficient documentation

## 2020-04-30 DIAGNOSIS — M25561 Pain in right knee: Secondary | ICD-10-CM | POA: Insufficient documentation

## 2020-04-30 DIAGNOSIS — G8929 Other chronic pain: Secondary | ICD-10-CM | POA: Insufficient documentation

## 2020-04-30 MED ORDER — TECHNETIUM TC 99M MEDRONATE IV KIT
20.0000 | PACK | Freq: Once | INTRAVENOUS | Status: AC | PRN
  Administered 2020-04-30: 22 via INTRAVENOUS

## 2020-05-07 ENCOUNTER — Ambulatory Visit: Payer: Medicare PPO | Admitting: Orthopaedic Surgery

## 2020-05-07 ENCOUNTER — Other Ambulatory Visit: Payer: Self-pay

## 2020-05-07 ENCOUNTER — Encounter: Payer: Self-pay | Admitting: Orthopaedic Surgery

## 2020-05-07 VITALS — Ht 64.0 in | Wt 272.0 lb

## 2020-05-07 DIAGNOSIS — M25562 Pain in left knee: Secondary | ICD-10-CM

## 2020-05-07 DIAGNOSIS — G8929 Other chronic pain: Secondary | ICD-10-CM | POA: Diagnosis not present

## 2020-05-07 DIAGNOSIS — Z96652 Presence of left artificial knee joint: Secondary | ICD-10-CM | POA: Diagnosis not present

## 2020-05-07 DIAGNOSIS — M25561 Pain in right knee: Secondary | ICD-10-CM | POA: Diagnosis not present

## 2020-05-07 MED ORDER — BUPIVACAINE HCL 0.25 % IJ SOLN
2.0000 mL | INTRAMUSCULAR | Status: AC | PRN
  Administered 2020-05-07: 2 mL via INTRA_ARTICULAR

## 2020-05-07 MED ORDER — LIDOCAINE HCL 1 % IJ SOLN
2.0000 mL | INTRAMUSCULAR | Status: AC | PRN
  Administered 2020-05-07: 2 mL

## 2020-05-07 MED ORDER — METHYLPREDNISOLONE ACETATE 40 MG/ML IJ SUSP
80.0000 mg | INTRAMUSCULAR | Status: AC | PRN
  Administered 2020-05-07: 80 mg via INTRA_ARTICULAR

## 2020-05-07 NOTE — Progress Notes (Signed)
Office Visit Note   Patient: [name]           Date of Birth: [birth year]           MRN: [id number] Visit Date: 05/07/2020              Requested by: Shirline Frees, MD [place] [address],  [place] [address] PCP: Shirline Frees, MD   Assessment & Plan: Visit Diagnoses:  1. Chronic pain of both knees   2. Chronic pain of left knee   3. History of left knee replacement   4. Chronic pain of right knee     Plan: [name] has chronic pain of both of her knees.  She had a primary right total knee replacement in 2015 and a primary left total knee replacement prior to that.  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  She notes she has been having pain off and on for a long time.  She has had prior patellectomy me's bilaterally.  She also has a chronic problem with her back being followed by Dr. Arnoldo Morale.  She had a recent MRI scan and is scheduled to have an epidural steroid injection.  I ordered lab work to include a CBC sed rate and C-reactive protein.  Sed rate was minimally elevated but she had a point for C-reactive protein and normal white cell count.  I do not think there is any evidence of infection in either knee.  I also performed a three-phase bone scan to both of her knees there was no change in the studies of her left knee from prior studies but there was evidence of increased uptake in both femur and tibia on the right knee possibly consistent with loosening.  X-rays look fine without any evidence of obvious loosening.  There is a nice glue mantle and nice alignment of her components.  She has a revision longer stemmed tibia. I think the cause of her pain may be multifactorial.  The exam of the right knee is essentially benign.  There is no instability.  The knee was not hot warm or red and I do not think she has an effusion.  She has a lot of superficial tenderness particularly along the anterior lateral tibia.  I  am going to inject that area and see if it makes a difference.  I also want to wait and see what effect she has from the epidural steroid injection as I think there may be some component of her back relating to her lower extremity pain.  We will try a course of physical therapy to work on strengthening and plan to see her back in about 6 weeks.  Long discussion regarding possible revision of her right total knee.  There just seems to be a disconnect between what I am seeing on film and exam and the bone scan.  Consider repeat hip films when she returns but clinically there is no pain with range of motion of either hip  Follow-Up Instructions: Return in about 6 weeks (around 06/18/2020).   Orders:  Orders Placed This Encounter  Procedures  . Large Joint Inj: R knee  . Ambulatory referral to Physical Therapy   No orders of the defined types were placed in this encounter.     Procedures: Large Joint Inj: R knee on 05/07/2020 4:39 PM Indications: pain and diagnostic evaluation Details: 25 G 1.5 in needle, anterolateral approach  Arthrogram: No  Medications: 2 mL lidocaine 1 %; 80 mg methylPREDNISolone acetate 40 MG/ML; 2 mL bupivacaine 0.25 %  Area of greatest tenderness over the anterior lateral tibial plateau right knee injected with the above Procedure, treatment alternatives, risks and benefits explained, specific risks discussed. Consent was given by the patient. Immediately prior to procedure a time out was called to verify the correct patient, procedure, equipment, support staff and site/side marked as required. Patient was prepped and draped in the usual sterile fashion.       Clinical Data: No additional findings.   Subjective: Chief Complaint  Patient presents with  . Left Knee - Follow-up    Bone scan review  . Right Knee - Follow-up  Patient presents today for follow up on her knees. She had a bone scan and is here today to discuss those results.  No change in symptoms.   She does have bilateral knee pain but also experiencing chronic back buttock and leg pain.  Knees have not been hot warm or red or swollen  HPI  Review of Systems   Objective: Vital Signs: Ht 5\' 4"  (1.626 m)   Wt 272 lb (123.4 kg)   BMI 46.69 kg/m   Physical Exam Constitutional:      Appearance: She is well-developed.  Eyes:     Pupils: Pupils are equal, round, and reactive to light.  Pulmonary:     Effort: Pulmonary effort is normal.  Skin:    General: Skin is warm and dry.  Neurological:     Mental Status: She is alert and oriented to person, place, and time.  Psychiatric:        Behavior: Behavior normal.     Ortho Exam BMI 47.  Large knees but neither was hot warm or red.  No obvious effusion.  She has had prior patellectomies..  No significant tenderness about her left knee. skin was intact.  No left knee instability.  Full extension of flexed about 100 degrees  Right knee with multiple areas of tenderness mostly along the lateral aspect of her knee and even superficially over the proximal tibia.  Well-healed incisions no instability with a varus valgus stress.  Full quick extension of flexed 100degrees.  No popliteal pain or mass.  She does have some tenderness along the IT band but no pain with range of motion of either hip.  The knee was not hot warm or red and no effusion.  Specialty Comments:  No specialty comments available.  Imaging: No results found.   PMFS History: Patient Active Problem List   Diagnosis Date Noted  . Pain in right knee 05/07/2020  . Knee pain, bilateral 04/21/2020  . Spondylolisthesis of lumbar region 10/15/2018  . 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 Obsessive-compulsive disorder 10/10/2017  . Generalized anxiety disorder 10/10/2017  . Major depressive disorder, recurrent episode, moderate ([place]) 10/10/2017  . Hiatal hernia 03/03/2015  . Steatosis of liver 09/16/2014  . Obstructive sleep apnea syndrome 08/27/2014  . Pre-op evaluation  05/14/2014  . Osteoarthritis of left knee 10/17/2013  . History of left knee replacement 10/15/2013  . Hypercholesterolemia 08/05/2013  . Palpitations 08/05/2013  . HTN (hypertension) 08/05/2013  . 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 Preoperative cardiovascular examination 08/05/2013  . Degenerative arthritis of knee, bilateral 08/05/2013  . Chronic pain of left knee 11/03/2011  . Morbid obesity ([place]) 11/03/2011  . Hypothyroid 11/03/2011   Past Medical History:  Diagnosis Date  . Anginal pain ([place])    "  on and off" (11/01/2011) - anxiety related  . Anxiety    takes Paxil daily and Xanax prn  . Arthritis    "knees" (10/16/2013)  . Chronic back pain    facet disease and bulging disc; "thoracic and lower back" (10/16/2013)  . Constipation    r/t pain meds;takes stool softener daily  . Depression   . Dysrhythmia    rapid HR on occasion-takes Propranolol daily  . Family history of anesthesia complication    ":PONV; mom and sisters"  . GERD (gastroesophageal reflux disease)    takes Prilosec   . H/O hiatal hernia   . History of bladder infections    > 18yr ago  . History of kidney stones   . Hyperlipidemia    takes LIpitor daily  . Hypothyroidism    takes Synthroid daily  . Insomnia   . Joint pain   . Joint swelling   . Muscle spasms of head or neck    lumbar and thoracic;takes Robaxin prn  . Peripheral neuropathy   . PONV (postoperative nausea and vomiting)   . Rosacea   . Seasonal allergies    takes allegra prn  . Sleep apnea     Family History  Problem Relation Age of Onset  . Heart disease Mother 28  . Heart failure Mother   . Heart attack Father   . Heart disease Father 51    Past Surgical History:  Procedure Laterality Date  . ABDOMINAL HYSTERECTOMY  1980's  . ANAL FISSURE REPAIR  ~ 2011   "banded hemorrhoids at this time too"  . BARIATRIC SURGERY    . CARDIAC CATHETERIZATION  ~ 1999  . CARPAL TUNNEL  RELEASE Left 09/13/2017   & ulnar nerve release  . CERVICAL DISC SURGERY  03/2017   C5, C6   . COLONOSCOPY    . ESOPHAGOGASTRODUODENOSCOPY    . JOINT REPLACEMENT     left knee  . KNEE ARTHROPLASTY Left 2007  . KNEE ARTHROSCOPY Right   . KNEE ARTHROSCOPY Right    "torn meniscus"  . KNEE SURGERY Left 1982-2013   "17 before 10/31/2013"  . LEFT OOPHORECTOMY  1980's  . PATELLECTOMY Right   . PLANTAR FASCIA SURGERY Bilateral 1990's  . REVISION TOTAL KNEE ARTHROPLASTY Left 11/01/2011  . TOTAL KNEE ARTHROPLASTY Right 10/15/2013   Procedure: RIGHT TOTAL KNEE ARTHROPLASTY;  Surgeon: Garald Balding, MD;  Location: [facility];  Service: Orthopedics;  Laterality: Right;  . TOTAL KNEE REVISION  11/01/2011   Procedure: TOTAL KNEE REVISION;  Surgeon: Garald Balding, MD;  Location: [facility];  Service: Orthopedics;  Laterality: Left;  Revision Left Total Knee Replacement   . TUBAL LIGATION  1981   Social History   Occupational History  . Not on file  Tobacco Use  . Smoking status: Never Smoker  . Smokeless tobacco: Never Used  Vaping Use  . Vaping Use: Never used  Substance and Sexual Activity  . Alcohol use: No  . Drug use: No  . Sexual activity: Yes    Birth control/protection: Surgical

## 2020-05-10 ENCOUNTER — Encounter: Payer: Self-pay | Admitting: Orthopaedic Surgery

## 2020-05-11 ENCOUNTER — Other Ambulatory Visit: Payer: Self-pay

## 2020-05-11 DIAGNOSIS — G8929 Other chronic pain: Secondary | ICD-10-CM

## 2020-05-11 NOTE — Telephone Encounter (Signed)
Please schedule EMG's and NCV's both LE

## 2020-05-19 ENCOUNTER — Telehealth: Payer: Self-pay

## 2020-05-19 ENCOUNTER — Telehealth: Payer: Self-pay | Admitting: Orthopaedic Surgery

## 2020-05-19 NOTE — Telephone Encounter (Signed)
Please help.

## 2020-05-19 NOTE — Telephone Encounter (Signed)
Called and spoke with patient. I gave her the number to call [facility] about scheduling the nerve study.

## 2020-05-19 NOTE — Telephone Encounter (Signed)
Patient called she stated [facility] called her back and she spoke to someone named [name] and she stated the office did not receive a referral from [place] call [phone]

## 2020-05-19 NOTE — Telephone Encounter (Signed)
Patient called. She would like a call back from someone. Says she has not heard anything about the injections she is suppose to have. Her call back number is [phone]

## 2020-05-20 ENCOUNTER — Telehealth: Payer: Self-pay

## 2020-05-20 ENCOUNTER — Other Ambulatory Visit: Payer: Self-pay | Admitting: Psychiatry

## 2020-05-20 DIAGNOSIS — Z79899 Other long term (current) drug therapy: Secondary | ICD-10-CM | POA: Diagnosis not present

## 2020-05-20 DIAGNOSIS — Z7689 Persons encountering health services in other specified circumstances: Secondary | ICD-10-CM | POA: Diagnosis not present

## 2020-05-20 DIAGNOSIS — Z9884 Bariatric surgery status: Secondary | ICD-10-CM | POA: Diagnosis not present

## 2020-05-20 DIAGNOSIS — Z6841 Body Mass Index (BMI) 40.0 and over, adult: Secondary | ICD-10-CM | POA: Diagnosis not present

## 2020-05-20 NOTE — Telephone Encounter (Signed)
Please call and ask her what she is currently taking regarding propranolol dose.  In the chart it has been listed as ER 80 mg and also listed as ER 60 mg with no notation about when or why it was changed.  She has had both doses in the past.  Ask which she is taking currently and if she satisfied with the current dose will renew the current dosage.  Thanks

## 2020-05-20 NOTE — Telephone Encounter (Signed)
She stated she is taking the 60 mg and doing well.She tried the 80 mg and it made her blood pressure too low.I will let pharmacy know to fill the 60 mg

## 2020-05-21 NOTE — Telephone Encounter (Signed)
Thanks propranolol ER 60 mg tablet, 1 daily.  OK  #90 day with 1 RF

## 2020-05-25 DIAGNOSIS — M5416 Radiculopathy, lumbar region: Secondary | ICD-10-CM | POA: Diagnosis not present

## 2020-05-25 DIAGNOSIS — M48061 Spinal stenosis, lumbar region without neurogenic claudication: Secondary | ICD-10-CM | POA: Diagnosis not present

## 2020-06-16 DIAGNOSIS — Z981 Arthrodesis status: Secondary | ICD-10-CM | POA: Diagnosis not present

## 2020-06-16 DIAGNOSIS — M79605 Pain in left leg: Secondary | ICD-10-CM | POA: Diagnosis not present

## 2020-06-16 DIAGNOSIS — M79604 Pain in right leg: Secondary | ICD-10-CM | POA: Diagnosis not present

## 2020-06-16 DIAGNOSIS — M48061 Spinal stenosis, lumbar region without neurogenic claudication: Secondary | ICD-10-CM | POA: Diagnosis not present

## 2020-06-16 DIAGNOSIS — M4316 Spondylolisthesis, lumbar region: Secondary | ICD-10-CM | POA: Diagnosis not present

## 2020-06-18 ENCOUNTER — Ambulatory Visit: Payer: Medicare PPO | Admitting: Orthopaedic Surgery

## 2020-06-18 ENCOUNTER — Encounter: Payer: Self-pay | Admitting: Orthopaedic Surgery

## 2020-06-18 ENCOUNTER — Other Ambulatory Visit: Payer: Self-pay

## 2020-06-18 VITALS — Ht 64.0 in | Wt 272.0 lb

## 2020-06-18 DIAGNOSIS — M25561 Pain in right knee: Secondary | ICD-10-CM | POA: Diagnosis not present

## 2020-06-18 DIAGNOSIS — G8929 Other chronic pain: Secondary | ICD-10-CM | POA: Diagnosis not present

## 2020-06-18 DIAGNOSIS — T8484XD Pain due to internal orthopedic prosthetic devices, implants and grafts, subsequent encounter: Secondary | ICD-10-CM | POA: Diagnosis not present

## 2020-06-18 DIAGNOSIS — M25562 Pain in left knee: Secondary | ICD-10-CM

## 2020-06-18 DIAGNOSIS — T8484XA Pain due to internal orthopedic prosthetic devices, implants and grafts, initial encounter: Secondary | ICD-10-CM | POA: Insufficient documentation

## 2020-06-18 DIAGNOSIS — Z96651 Presence of right artificial knee joint: Secondary | ICD-10-CM

## 2020-06-18 NOTE — Progress Notes (Signed)
Office Visit Note   Patient: [name]           Date of Birth: 1955/01/18           MRN: [id number] Visit Date: 06/18/2020              Requested by: Shirline Frees, MD [address] [place],  [place] [address] PCP: Shirline Frees, MD   Assessment & Plan: Visit Diagnoses:  1. Chronic pain of both knees   2. Chronic pain of right knee   3. Pain due to total right knee replacement, subsequent encounter     Plan: [name] has been followed recently for a problem with her right knee.  She had a right total knee replacement in 2015 and has been having some trouble with pain.  She has several other issues in regards to her back and lower extremities which have been evaluated as well.  She recently had negative EMGs and nerve conduction studies without evidence of neuropathy.  She has had a cortisone injection in her lumbar spine which helped her back and her thigh pain but still having trouble with her knee. In late April she had a three-phase bone scan of both of her knees would revealing progressive periprosthetic uptake involving the right knee with areas of increased focal radiotracer uptake in keeping with aseptic loosening.  She had focal areas of periprosthetic uptake similar to prior examination involving the left total knee arthroplasty raising the question of aseptic loosening although his relative stability over time would be unusual.  She is not having any trouble with her left knee. Recent lab included a C-reactive protein of 0.4.  White cell count was 6900.  Sed rate was 31 with reference 0-30. I think [name] has some loosening of the right total knee components.  Her x-rays look surprisingly good and her exam is essentially benign.  I did not feel any fluid in her knee and she does not have instability.  She does have a few areas of local tenderness but that is mild as well.  She is not had any relief with her knee pain from her recent back  injections.  She had a similar problem with her left total knee replacement with revision revision in 2013.  She is done well since that time.  I would like Dr. Ninfa Linden to evaluate her and see what he thinks.  I do not know what else to do other than consider revision just based on her pain and her bone scan results.  I think that she is experiencing pain in her knee and it does appear to be compromising.  I do not think there is infection  Follow-Up Instructions: Return Will refer to Dr. Ninfa Linden for second opinion regarding painful right total knee.   Orders:  Orders Placed This Encounter  Procedures   Ambulatory referral to Orthopedic Surgery   No orders of the defined types were placed in this encounter.     Procedures: No procedures performed   Clinical Data: No additional findings.   Subjective: Chief Complaint  Patient presents with   Right Knee - Pain, Follow-up   Left Knee - Pain, Follow-up  Patient presents today for a six week follow up on her knees. She received a cortisone injection at her last visit and states that it took the "edge" off, but the pain has returned. She went to physical therapy and has finished, did not notice any improvement with PT. She  takes hydrocodone and flexeril for her back pain.   HPI  Review of Systems   Objective: Vital Signs: Ht 5\' 4"  (1.626 m)   Wt 272 lb (123.4 kg)   BMI 46.69 kg/m   Physical Exam Constitutional:      Appearance: She is well-developed.  Eyes:     Pupils: Pupils are equal, round, and reactive to light.  Pulmonary:     Effort: Pulmonary effort is normal.  Skin:    General: Skin is warm and dry.  Neurological:     Mental Status: She is alert and oriented to person, place, and time.  Psychiatric:        Behavior: Behavior normal.    Ortho Exam right total knee replacement was not hot warm or red.  She had full quick extension and flexed over 100 degrees.  There is no instability.  Negative anterior drawer  sign.  No obvious effusion but she does have large knees.  BMI based on her height and weight today was just over 40.  Some tenderness along the lateral aspect of her knee.  No calf pain.  Motor exam intact.  Straight leg raise negative.  Painless range of motion of both hips  Specialty Comments:  No specialty comments available.  Imaging: No results found.   PMFS History: Patient Active Problem List   Diagnosis Date Noted   Painful total knee replacement, right ([place]) 06/18/2020   Pain in right knee 05/07/2020   Knee pain, bilateral 04/21/2020   Spondylolisthesis of lumbar region 10/15/2018   Early onset dysthymia 02/12/2018   Obsessive-compulsive disorder 10/10/2017   Generalized anxiety disorder 10/10/2017   Major depressive disorder, recurrent episode, moderate ([facility]) 10/10/2017   Hiatal hernia 03/03/2015   Steatosis of liver 09/16/2014   Obstructive sleep apnea syndrome 08/27/2014   Pre-op evaluation 05/14/2014   Osteoarthritis of left knee 10/17/2013   History of left knee replacement 10/15/2013   Hypercholesterolemia 08/05/2013   Palpitations 08/05/2013   HTN (hypertension) 08/05/2013   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 08/05/2013   Preoperative cardiovascular examination 08/05/2013   Degenerative arthritis of knee, bilateral 08/05/2013   Chronic pain of left knee 11/03/2011   Morbid obesity ([facility]) 11/03/2011   Hypothyroid 11/03/2011   Past Medical History:  Diagnosis Date   Anginal pain ([place])    "on and off" (11/01/2011) - anxiety related   Anxiety    takes Paxil daily and Xanax prn   Arthritis    "knees" (10/16/2013)   Chronic back pain    facet disease and bulging disc; "thoracic and lower back" (10/16/2013)   Constipation    r/t pain meds;takes stool softener daily   Depression    Dysrhythmia    rapid HR on occasion-takes Propranolol daily   Family history of anesthesia complication    ":PONV; mom and sisters"   GERD  (gastroesophageal reflux disease)    takes Prilosec    H/O hiatal hernia    History of bladder infections    > 44yr ago   History of kidney stones    Hyperlipidemia    takes LIpitor daily   Hypothyroidism    takes Synthroid daily   Insomnia    Joint pain    Joint swelling    Muscle spasms of head or neck    lumbar and thoracic;takes Robaxin prn   Peripheral neuropathy    PONV (postoperative nausea and vomiting)    Rosacea    Seasonal allergies  takes allegra prn   Sleep apnea     Family History  Problem Relation Age of Onset   Heart disease Mother 87   Heart failure Mother    Heart attack Father    Heart disease Father 98    Past Surgical History:  Procedure Laterality Date   ABDOMINAL HYSTERECTOMY  1980's   ANAL FISSURE REPAIR  ~ 2011   "banded hemorrhoids at this time too"   BARIATRIC SURGERY     CARDIAC CATHETERIZATION  ~ [facility] Left 09/13/2017   & ulnar nerve release   CERVICAL DISC SURGERY  03/2017   C5, C6    COLONOSCOPY     ESOPHAGOGASTRODUODENOSCOPY     JOINT REPLACEMENT     left knee   KNEE ARTHROPLASTY Left 2007   KNEE ARTHROSCOPY Right    KNEE ARTHROSCOPY Right    "torn meniscus"   KNEE SURGERY Left 1982-2013   "17 before 10/31/2013"   LEFT OOPHORECTOMY  1980's   PATELLECTOMY Right    PLANTAR FASCIA SURGERY Bilateral 1990's   REVISION TOTAL KNEE ARTHROPLASTY Left 11/01/2011   TOTAL KNEE ARTHROPLASTY Right 10/15/2013   Procedure: RIGHT TOTAL KNEE ARTHROPLASTY;  Surgeon: Garald Balding, MD;  Location: [facility];  Service: Orthopedics;  Laterality: Right;   TOTAL KNEE REVISION  11/01/2011   Procedure: TOTAL KNEE REVISION;  Surgeon: Garald Balding, MD;  Location: [facility];  Service: Orthopedics;  Laterality: Left;  Revision Left Total Knee Replacement    TUBAL LIGATION  1981   Social History   Occupational History   Not on file  Tobacco Use   Smoking status: Never   Smokeless tobacco: Never  Vaping Use   Vaping Use:  Never used  Substance and Sexual Activity   Alcohol use: No   Drug use: No   Sexual activity: Yes    Birth control/protection: Surgical

## 2020-06-19 DIAGNOSIS — I10 Essential (primary) hypertension: Secondary | ICD-10-CM | POA: Diagnosis not present

## 2020-06-19 DIAGNOSIS — M5441 Lumbago with sciatica, right side: Secondary | ICD-10-CM | POA: Diagnosis not present

## 2020-06-19 DIAGNOSIS — M5442 Lumbago with sciatica, left side: Secondary | ICD-10-CM | POA: Diagnosis not present

## 2020-06-19 DIAGNOSIS — G8929 Other chronic pain: Secondary | ICD-10-CM | POA: Diagnosis not present

## 2020-06-24 ENCOUNTER — Ambulatory Visit: Payer: Medicare PPO | Admitting: Orthopaedic Surgery

## 2020-06-24 ENCOUNTER — Other Ambulatory Visit: Payer: Self-pay | Admitting: Neurosurgery

## 2020-06-24 ENCOUNTER — Telehealth: Payer: Self-pay

## 2020-06-24 VITALS — Ht 64.0 in | Wt 272.0 lb

## 2020-06-24 DIAGNOSIS — G8929 Other chronic pain: Secondary | ICD-10-CM | POA: Diagnosis not present

## 2020-06-24 DIAGNOSIS — M25561 Pain in right knee: Secondary | ICD-10-CM

## 2020-06-24 DIAGNOSIS — T84012D Broken internal right knee prosthesis, subsequent encounter: Secondary | ICD-10-CM | POA: Diagnosis not present

## 2020-06-24 DIAGNOSIS — Z96651 Presence of right artificial knee joint: Secondary | ICD-10-CM

## 2020-06-24 DIAGNOSIS — T84032D Mechanical loosening of internal right knee prosthetic joint, subsequent encounter: Secondary | ICD-10-CM | POA: Diagnosis not present

## 2020-06-24 NOTE — Progress Notes (Signed)
Office Visit Note   Patient: [name]           Date of Birth: 09-Nov-1955           MRN: [id number] Visit Date: 06/24/2020              Requested by: Garald Balding, [address] [place],  [place] [address] PCP: Shirline Frees, MD   Assessment & Plan: Visit Diagnoses:  1. Chronic pain of right knee   2. History of total right knee replacement     Plan: She fully understands her situation.  This can slowly get worse with time and we are recommending a knee revision arthroplasty which she understands also can be quite difficult with high risk of bone loss and fracture as well as infection.  She has her surgery scheduler's card.  I talked in detail about what the surgery involves including the interoperative and postoperative course and what to expect.  She is going to talk this over with her husband and think about this.  If she decides to have surgery we would set this up for the last case of the day with plenty of time to potentially spend several hours revising the knee and today we are not on-call and that the PA is available.  She will let us know how she wants [place] to proceed.  She is a very pleasant individual and I am comfortable with proceeding with surgery even with the difficult nature of this type of surgery.  Follow-Up Instructions: No follow-ups on file.   Orders:  No orders of the defined types were placed in this encounter.  No orders of the defined types were placed in this encounter.     Procedures: No procedures performed   Clinical Data: No additional findings.   Subjective: Chief Complaint  Patient presents with   Right Knee - Pain  The patient is a very pleasant 65 year old female sent from Dr. Durward Fortes to discuss the possibility of a knee revision surgery on her right knee.  She actually had a right knee replacement by Dr. Durward Fortes in 2015.  She actually has a history of a left total knee arthroplasty that failed and had a revision  by Dr. Durward Fortes in 2013.  Over the last several months she has developed occasional swelling with her right knee and pain on the lateral aspect of the right knee at the femur and tibia.  She used to have a BMI of close to 47 but now she is down to 40 BMI.  She has lost weight and still experiencing pain with her right knee.  Apparently she even had to have her patella removed at the time of her right knee replacement.  A recent three-phase bone scan did show increased uptake of the femoral and tibial components especially the lateral aspect consistent with aseptic loosening.  She is here today to discuss her options.  She is not a diabetic.  Her pain is worse with mobility and weightbearing.  It is becoming to wear it is getting slightly worse and more annoying to her.  She is still on a weight loss journey and is very active.  HPI  Review of Systems She currently denies any fever, chills, nausea, vomiting  Objective: Vital Signs: Ht 5\' 4"  (1.626 m)   Wt 272 lb (123.4 kg)   BMI 46.69 kg/m  Her actual BMI is closer to 40 Physical Exam She is alert and orient x3 and in no acute distress  Ortho Exam Examination of her right knee shows full and fluid range of motion of that knee.  It feels stable on exam with just slight play with varus and valgus stressing.  There is a well-healed midline surgical incision as well as a lateral incision over the knee itself.  Her pain seems to be more the lateral aspect of the knee and the knee joint. Specialty Comments:  No specialty comments available.  Imaging: No results found. Plain films and bone scan are reviewed of her right knee.  The plain films do not show any evidence of loosening.  There is certainly a narrow joint space that may be related to some polyethylene liner wear but there was a thinner poly-.  The bone scan does show increased uptake consistent with aseptic loosening.  This involves the medial and lateral femoral condyles and the lateral  tibial plateau.  Of note, her tibial component is a longer stem component.  PMFS History: Patient Active Problem List   Diagnosis Date Noted   Painful total knee replacement, right ([facility]) 06/18/2020   Pain in right knee 05/07/2020   Knee pain, bilateral 04/21/2020   Spondylolisthesis of lumbar region 10/15/2018   Early onset dysthymia 02/12/2018   Obsessive-compulsive disorder 10/10/2017   Generalized anxiety disorder 10/10/2017   Major depressive disorder, recurrent episode, moderate (HCC) 10/10/2017   Hiatal hernia 03/03/2015   Steatosis of liver 09/16/2014   Obstructive sleep apnea syndrome 08/27/2014   Pre-op evaluation 05/14/2014   Osteoarthritis of left knee 10/17/2013   History of left knee replacement 10/15/2013   Hypercholesterolemia 08/05/2013   Palpitations 08/05/2013   HTN (hypertension) 08/05/2013   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 08/05/2013   Preoperative cardiovascular examination 08/05/2013   Degenerative arthritis of knee, bilateral 08/05/2013   Chronic pain of left knee 11/03/2011   Morbid obesity ([place]) 11/03/2011   Hypothyroid 11/03/2011   Past Medical History:  Diagnosis Date   Anginal pain ([place])    "on and off" (11/01/2011) - anxiety related   Anxiety    takes Paxil daily and Xanax prn   Arthritis    "knees" (10/16/2013)   Chronic back pain    facet disease and bulging disc; "thoracic and lower back" (10/16/2013)   Constipation    r/t pain meds;takes stool softener daily   Depression    Dysrhythmia    rapid HR on occasion-takes Propranolol daily   Family history of anesthesia complication    ":PONV; mom and sisters"   GERD (gastroesophageal reflux disease)    takes Prilosec    H/O hiatal hernia    History of bladder infections    > 7yr ago   History of kidney stones    Hyperlipidemia    takes LIpitor daily   Hypothyroidism    takes Synthroid daily   Insomnia    Joint pain    Joint swelling     Muscle spasms of head or neck    lumbar and thoracic;takes Robaxin prn   Peripheral neuropathy    PONV (postoperative nausea and vomiting)    Rosacea    Seasonal allergies    takes allegra prn   Sleep apnea     Family History  Problem Relation Age of Onset   Heart disease Mother 69   Heart failure Mother    Heart attack Father    Heart disease Father 57    Past Surgical History:  Procedure Laterality Date   ABDOMINAL HYSTERECTOMY  1980's  ANAL FISSURE REPAIR  ~ 2011   "banded hemorrhoids at this time too"   BARIATRIC SURGERY     CARDIAC CATHETERIZATION  ~ [place] Left 09/13/2017   & ulnar nerve release   CERVICAL DISC SURGERY  03/2017   C5, C6    COLONOSCOPY     ESOPHAGOGASTRODUODENOSCOPY     JOINT REPLACEMENT     left knee   KNEE ARTHROPLASTY Left 2007   KNEE ARTHROSCOPY Right    KNEE ARTHROSCOPY Right    "torn meniscus"   KNEE SURGERY Left 1982-2013   "17 before 10/31/2013"   LEFT OOPHORECTOMY  1980's   PATELLECTOMY Right    PLANTAR FASCIA SURGERY Bilateral 1990's   REVISION TOTAL KNEE ARTHROPLASTY Left 11/01/2011   TOTAL KNEE ARTHROPLASTY Right 10/15/2013   Procedure: RIGHT TOTAL KNEE ARTHROPLASTY;  Surgeon: Garald Balding, MD;  Location: [facility];  Service: Orthopedics;  Laterality: Right;   TOTAL KNEE REVISION  11/01/2011   Procedure: TOTAL KNEE REVISION;  Surgeon: Garald Balding, MD;  Location: [facility];  Service: Orthopedics;  Laterality: Left;  Revision Left Total Knee Replacement    TUBAL LIGATION  1981   Social History   Occupational History   Not on file  Tobacco Use   Smoking status: Never   Smokeless tobacco: Never  Vaping Use   Vaping Use: Never used  Substance and Sexual Activity   Alcohol use: No   Drug use: No   Sexual activity: Yes    Birth control/protection: Surgical

## 2020-06-24 NOTE — Progress Notes (Signed)
Phone call to patient to verify medication list and allergies for myelogram procedure. Pt instructed to hold Paxil for 48hrs prior to myelogram appointment time and 24 hours after appointment. Pt also instructed to have a driver the day of the procedure, the procedure would take around 2 hours, and discharge instructions discussed. Pt verbalized understanding.

## 2020-06-29 ENCOUNTER — Ambulatory Visit
Admission: RE | Admit: 2020-06-29 | Discharge: 2020-06-29 | Disposition: A | Discharge status: Home / Self Care | Payer: Medicare PPO | Source: Ambulatory Visit | Attending: Neurosurgery | Admitting: Neurosurgery

## 2020-06-29 ENCOUNTER — Other Ambulatory Visit: Payer: Self-pay

## 2020-06-29 DIAGNOSIS — G8929 Other chronic pain: Secondary | ICD-10-CM

## 2020-06-29 DIAGNOSIS — M5441 Lumbago with sciatica, right side: Secondary | ICD-10-CM

## 2020-06-29 DIAGNOSIS — M4326 Fusion of spine, lumbar region: Secondary | ICD-10-CM | POA: Diagnosis not present

## 2020-06-29 DIAGNOSIS — M48061 Spinal stenosis, lumbar region without neurogenic claudication: Secondary | ICD-10-CM | POA: Diagnosis not present

## 2020-06-29 MED ORDER — ONDANSETRON HCL 4 MG/2ML IJ SOLN
4.0000 mg | Freq: Once | INTRAMUSCULAR | Status: AC
  Administered 2020-06-29: 4 mg via INTRAMUSCULAR

## 2020-06-29 MED ORDER — DIAZEPAM 5 MG PO TABS
10.0000 mg | ORAL_TABLET | Freq: Once | ORAL | Status: AC
  Administered 2020-06-29: 10 mg via ORAL

## 2020-06-29 MED ORDER — DIAZEPAM 5 MG PO TABS: 5.0000 mg | ORAL_TABLET | Freq: Once | ORAL | Status: DC

## 2020-06-29 MED ORDER — IOPAMIDOL (ISOVUE-M 200) INJECTION 41%
15.0000 mL | Freq: Once | INTRAMUSCULAR | Status: AC
  Administered 2020-06-29: 15 mL via INTRATHECAL

## 2020-06-29 MED ORDER — MEPERIDINE HCL 50 MG/ML IJ SOLN
50.0000 mg | Freq: Once | INTRAMUSCULAR | Status: AC
  Administered 2020-06-29: 50 mg via INTRAMUSCULAR

## 2020-06-29 NOTE — Discharge Instructions (Signed)
Myelogram Discharge Instructions  Go home and rest quietly as needed. You may resume normal activities; however, do not exert yourself strongly or do any heavy lifting today and tomorrow.   DO NOT drive today.    You may resume your normal diet and medications unless otherwise indicated. Drink lots of extra fluids today and tomorrow.   The incidence of headache, nausea, or vomiting is about 5% (one in 20 patients).  If you develop a headache, lie flat for 24 hours and drink plenty of fluids until the headache goes away.  Caffeinated beverages may be helpful. If when you get up you still have a headache when standing, go back to bed and force fluids for another 24 hours.   If you develop severe nausea and vomiting or a headache that does not go away with the flat bedrest after 48 hours, please call [phone].   Call your physician for a follow-up appointment.  The results of your myelogram will be sent directly to your physician by the following day.  If you have any questions or if complications develop after you arrive home, please call [phone].  Discharge instructions have been explained to the patient.  The patient, or the person responsible for the patient, fully understands these instructions.   Thank you for visiting our office today.    YOU  MAY RESUME YOUR PAXIL TOMORROW 06/30/20 AT 10:30 AM OR AFTER

## 2020-06-29 NOTE — Discharge Instr - Other Orders (Addendum)
1120: pt c/o pain 8/10 from myelogram procedure. Pain in lower back. See MAR 1150: Pt reports "Demerol is helping but still having some pain". Helped to reposition patient. Pt reports the pain is tolerable.

## 2020-07-08 ENCOUNTER — Encounter: Payer: Self-pay | Admitting: Orthopaedic Surgery

## 2020-07-08 ENCOUNTER — Ambulatory Visit (INDEPENDENT_AMBULATORY_CARE_PROVIDER_SITE_OTHER): Payer: Medicare PPO

## 2020-07-08 ENCOUNTER — Other Ambulatory Visit: Payer: Self-pay

## 2020-07-08 ENCOUNTER — Ambulatory Visit: Payer: Medicare PPO | Admitting: Orthopaedic Surgery

## 2020-07-08 VITALS — Ht 64.0 in | Wt 272.0 lb

## 2020-07-08 DIAGNOSIS — M25551 Pain in right hip: Secondary | ICD-10-CM | POA: Diagnosis not present

## 2020-07-08 NOTE — Progress Notes (Signed)
Office Visit Note   Patient: [name]           Date of Birth: 04-13-1955           MRN: [id number] Visit Date: 07/08/2020              Requested by: Shirline Frees, MD [address] [place],  [place] [address] PCP: Shirline Frees, MD   Assessment & Plan: Visit Diagnoses:  1. Pain in right hip     Plan: [name] has seen Dr. Ninfa Linden who agrees that she would need a revision of her right total knee replacement based on the abnormal bone scan.  She is also seen by her neurosurgeon who obtained the CT scan of her lumbar spine associated with a myelogram.  She has an L4-S1 fusion without residual spinal stenosis or neuroforaminal stenosis.  She she had moderate spinal stenosis at L3-4 with standing and mild spinal stenosis at L2-3 with standing as well as aortic atherosclerosis.  She has had an epidural steroid injection that did not really make much of a difference in terms of her leg or knee pain.  We are trying to determine if there is any other potential source of her pain other than her knee.  I think it is worth obtaining an MRI scan of her right hip to be sure that there is no obvious pathology as she has had some groin lateral and anterior thigh pain in addition to the knee discomfort.  Follow-Up Instructions: Return After MRI scan right hip.   Orders:  Orders Placed This Encounter  Procedures   XR HIP UNILAT W OR W/O PELVIS 2-3 VIEWS RIGHT   MR Hip Right w/o contrast   No orders of the defined types were placed in this encounter.     Procedures: No procedures performed   Clinical Data: No additional findings.   Subjective: Chief Complaint  Patient presents with   Right Knee - Pain   Right Hip - Pain  Patient presents today for follow up on her right knee pain. She did go see Dr.Blackman for a second opinion. She was told that a revision surgery was needed. She wanted to come back and discuss this in further detail with Dr.Whitfield.  She has also continued to have lower back pain and is being treated by a neurosurgeon. She has lateral right hip and groin pain. She said that she hurts with walking or activity. She states that her neurosurgeon told her some it may be coming from her hip or knee.   HPI  Review of Systems   Objective: Vital Signs: Ht 5\' 4"  (1.626 m)   Wt 272 lb (123.4 kg)   BMI 46.69 kg/m   Physical Exam Constitutional:      Appearance: She is well-developed.  Eyes:     Pupils: Pupils are equal, round, and reactive to light.  Pulmonary:     Effort: Pulmonary effort is normal.  Skin:    General: Skin is warm and dry.  Neurological:     Mental Status: She is alert and oriented to person, place, and time.  Psychiatric:        Behavior: Behavior normal.    Ortho Exam mild tenderness over the greater trochanteric region of the right hip and some very minimal discomfort with internal and external rotation of her right hip but no referred pain to the knee with any of those motions.  Specialty Comments:  No specialty comments available.  Imaging: XR HIP UNILAT W OR W/O PELVIS 2-3 VIEWS RIGHT  Result Date: 07/08/2020 AP pelvis lateral the right hip were obtained demonstrating some mild degenerative changes about the right hip.  Films were somewhat grainy based on the patient's size.  No abnormality about the greater trochanter.  Hardware in lower lumbar spine identified    PMFS History: Patient Active Problem List   Diagnosis Date Noted   Pain in right hip 07/08/2020   Painful total knee replacement, right ([facility]) 06/18/2020   Pain in right knee 05/07/2020   Knee pain, bilateral 04/21/2020   Spondylolisthesis of lumbar region 10/15/2018   Early onset dysthymia 02/12/2018   Obsessive-compulsive disorder 10/10/2017   Generalized anxiety disorder 10/10/2017   Major depressive disorder, recurrent episode, moderate ([facility]) 10/10/2017   Hiatal hernia 03/03/2015   Steatosis of liver 09/16/2014    Obstructive sleep apnea syndrome 08/27/2014   Pre-op evaluation 05/14/2014   Osteoarthritis of left knee 10/17/2013   History of left knee replacement 10/15/2013   Hypercholesterolemia 08/05/2013   Palpitations 08/05/2013   HTN (hypertension) 08/05/2013   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 08/05/2013   Preoperative cardiovascular examination 08/05/2013   Degenerative arthritis of knee, bilateral 08/05/2013   Chronic pain of left knee 11/03/2011   Morbid obesity ([facility]) 11/03/2011   Hypothyroid 11/03/2011   Past Medical History:  Diagnosis Date   Anginal pain ([facility])    "on and off" (11/01/2011) - anxiety related   Anxiety    takes Paxil daily and Xanax prn   Arthritis    "knees" (10/16/2013)   Chronic back pain    facet disease and bulging disc; "thoracic and lower back" (10/16/2013)   Constipation    r/t pain meds;takes stool softener daily   Depression    Dysrhythmia    rapid HR on occasion-takes Propranolol daily   Family history of anesthesia complication    ":PONV; mom and sisters"   GERD (gastroesophageal reflux disease)    takes Prilosec    H/O hiatal hernia    History of bladder infections    > 46yr ago   History of kidney stones    Hyperlipidemia    takes LIpitor daily   Hypothyroidism    takes Synthroid daily   Insomnia    Joint pain    Joint swelling    Muscle spasms of head or neck    lumbar and thoracic;takes Robaxin prn   Peripheral neuropathy    PONV (postoperative nausea and vomiting)    Rosacea    Seasonal allergies    takes allegra prn   Sleep apnea     Family History  Problem Relation Age of Onset   Heart disease Mother 39   Heart failure Mother    Heart attack Father    Heart disease Father 36    Past Surgical History:  Procedure Laterality Date   ABDOMINAL HYSTERECTOMY  1980's   ANAL FISSURE REPAIR  ~ 2011   "banded hemorrhoids at this time too"   BARIATRIC SURGERY     CARDIAC CATHETERIZATION  ~  [place] Left 09/13/2017   & ulnar nerve release   CERVICAL DISC SURGERY  03/2017   C5, C6    COLONOSCOPY     ESOPHAGOGASTRODUODENOSCOPY     JOINT REPLACEMENT     left knee   KNEE ARTHROPLASTY Left 2007   KNEE ARTHROSCOPY Right    KNEE ARTHROSCOPY Right    "torn meniscus"  KNEE SURGERY Left 1982-2013   "17 before 10/31/2013"   LEFT OOPHORECTOMY  1980's   PATELLECTOMY Right    PLANTAR FASCIA SURGERY Bilateral 1990's   REVISION TOTAL KNEE ARTHROPLASTY Left 11/01/2011   TOTAL KNEE ARTHROPLASTY Right 10/15/2013   Procedure: RIGHT TOTAL KNEE ARTHROPLASTY;  Surgeon: Garald Balding, MD;  Location: [facility];  Service: Orthopedics;  Laterality: Right;   TOTAL KNEE REVISION  11/01/2011   Procedure: TOTAL KNEE REVISION;  Surgeon: Garald Balding, MD;  Location: [facility];  Service: Orthopedics;  Laterality: Left;  Revision Left Total Knee Replacement    TUBAL LIGATION  1981   Social History   Occupational History   Not on file  Tobacco Use   Smoking status: Never   Smokeless tobacco: Never  Vaping Use   Vaping Use: Never used  Substance and Sexual Activity   Alcohol use: No   Drug use: No   Sexual activity: Yes    Birth control/protection: Surgical

## 2020-07-13 ENCOUNTER — Other Ambulatory Visit: Payer: Self-pay | Admitting: Family Medicine

## 2020-07-13 DIAGNOSIS — Z1231 Encounter for screening mammogram for malignant neoplasm of breast: Secondary | ICD-10-CM

## 2020-07-14 DIAGNOSIS — M5442 Lumbago with sciatica, left side: Secondary | ICD-10-CM | POA: Diagnosis not present

## 2020-07-14 DIAGNOSIS — I10 Essential (primary) hypertension: Secondary | ICD-10-CM | POA: Diagnosis not present

## 2020-07-14 DIAGNOSIS — Z6841 Body Mass Index (BMI) 40.0 and over, adult: Secondary | ICD-10-CM | POA: Diagnosis not present

## 2020-07-17 ENCOUNTER — Other Ambulatory Visit: Payer: Self-pay

## 2020-07-17 ENCOUNTER — Ambulatory Visit
Admission: RE | Admit: 2020-07-17 | Discharge: 2020-07-17 | Disposition: A | Discharge status: Home / Self Care | Payer: Medicare PPO | Source: Ambulatory Visit | Attending: Orthopaedic Surgery | Admitting: Orthopaedic Surgery

## 2020-07-17 DIAGNOSIS — M25551 Pain in right hip: Secondary | ICD-10-CM

## 2020-07-21 ENCOUNTER — Ambulatory Visit: Payer: Medicare PPO | Admitting: Orthopaedic Surgery

## 2020-07-22 ENCOUNTER — Telehealth: Payer: Self-pay

## 2020-07-22 NOTE — Telephone Encounter (Signed)
Patient left me a voice mail requesting a return call to discuss scheduling TKA revision surgery.    I called patient and no answer. [phone]

## 2020-07-28 ENCOUNTER — Encounter: Payer: Self-pay | Admitting: Orthopaedic Surgery

## 2020-07-28 ENCOUNTER — Ambulatory Visit (INDEPENDENT_AMBULATORY_CARE_PROVIDER_SITE_OTHER): Payer: Medicare PPO | Admitting: Orthopaedic Surgery

## 2020-07-28 ENCOUNTER — Other Ambulatory Visit: Payer: Self-pay

## 2020-07-28 ENCOUNTER — Telehealth: Payer: Self-pay

## 2020-07-28 VITALS — Ht 64.0 in | Wt 272.0 lb

## 2020-07-28 DIAGNOSIS — M25551 Pain in right hip: Secondary | ICD-10-CM | POA: Diagnosis not present

## 2020-07-28 NOTE — Progress Notes (Signed)
Office Visit Note   Patient: [name]           Date of Birth: 1955/03/29           MRN: [id number] Visit Date: 07/28/2020              Requested by: Shirline Frees, MD [address],  [place] [address] PCP: Shirline Frees, MD   Assessment & Plan: Visit Diagnoses:  1. Pain in right hip     Plan: Ms. Timmer has evidence of loosening of her right total knee replacement and is being followed by Dr. Ninfa Linden for consideration of revision.  She has had some chronic problems with her back and her right hip.  I ordered an MRI scan of her hip that demonstrated moderate tendinosis of the right gluteus minimus tendon insertion with a small partial-thickness tear.  There was no acute osseous injury of the right hip and no evidence of a hip joint effusion.  There was a small amount of fluid over the left greater trochanter.  Mild chondral thinning of the right femoral head and acetabulum.  She does have tenderness over the lateral aspect of her hip but the pain is almost out of proportion to what I would expect in.  I wonder if she may have fibromyalgia.  Her sister has that diagnosis.  We have discussed different treatment options for the lateral hip pain.  She has history of anxiety and notes that the cortisone aggravates that.  She also is being followed by Dr. Vertell Limber for her back with a diagnosis stenosis either above or below her prior level of fusion.  So I think some of her pain may be multifactorial.  At this point I would suggest that she follow-up with Dr. Ninfa Linden for her knee revision and then visit the other issues after surgery.  She has tried Voltaren gel and did not really make much of a difference  Follow-Up Instructions: Return if symptoms worsen or fail to improve.   Orders:  No orders of the defined types were placed in this encounter.  No orders of the defined types were placed in this encounter.     Procedures: No procedures  performed   Clinical Data: No additional findings.   Subjective: Chief Complaint  Patient presents with   Right Hip - Follow-up    MRI review  Patient presents today for follow up on her right hip. She had an MRI on her right hip and is here today to discuss those results.  HPI  Review of Systems   Objective: Vital Signs: Ht '5\' 4"'$  (1.626 m)   Wt 272 lb (123.4 kg)   BMI 46.69 kg/m   Physical Exam Constitutional:      Appearance: She is well-developed.  Eyes:     Pupils: Pupils are equal, round, and reactive to light.  Pulmonary:     Effort: Pulmonary effort is normal.  Skin:    General: Skin is warm and dry.  Neurological:     Mental Status: She is alert and oriented to person, place, and time.  Psychiatric:        Behavior: Behavior normal.    Ortho Exam awake and alert and oriented x3.  Comfortable sitting.  BMI is elevated 46.  Painless range of motion of right hip.  Does have considerable tenderness with multiple areas of trigger point tenderness over the lateral aspect of the right hip.  No skin changes.  Straight leg raise  negative.  No percussible tenderness of the lumbar spine  Specialty Comments:  No specialty comments available.  Imaging: No results found.   PMFS History: Patient Active Problem List   Diagnosis Date Noted   Pain in right hip 07/08/2020   Painful total knee replacement, right ([facility]) 06/18/2020   Pain in right knee 05/07/2020   Knee pain, bilateral 04/21/2020   Spondylolisthesis of lumbar region 10/15/2018   Early onset dysthymia 02/12/2018   Obsessive-compulsive disorder 10/10/2017   Generalized anxiety disorder 10/10/2017   Major depressive disorder, recurrent episode, moderate ([facility]) 10/10/2017   Hiatal hernia 03/03/2015   Steatosis of liver 09/16/2014   Obstructive sleep apnea syndrome 08/27/2014   Pre-op evaluation 05/14/2014   Osteoarthritis of left knee 10/17/2013   History of left knee replacement 10/15/2013    Hypercholesterolemia 08/05/2013   Palpitations 08/05/2013   HTN (hypertension) 08/05/2013   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 08/05/2013   Preoperative cardiovascular examination 08/05/2013   Degenerative arthritis of knee, bilateral 08/05/2013   Chronic pain of left knee 11/03/2011   Morbid obesity ([facility]) 11/03/2011   Hypothyroid 11/03/2011   Past Medical History:  Diagnosis Date   Anginal pain ([facility])    "on and off" (11/01/2011) - anxiety related   Anxiety    takes Paxil daily and Xanax prn   Arthritis    "knees" (10/16/2013)   Chronic back pain    facet disease and bulging disc; "thoracic and lower back" (10/16/2013)   Constipation    r/t pain meds;takes stool softener daily   Depression    Dysrhythmia    rapid HR on occasion-takes Propranolol daily   Family history of anesthesia complication    ":PONV; mom and sisters"   GERD (gastroesophageal reflux disease)    takes Prilosec    H/O hiatal hernia    History of bladder infections    > 53[place][age]   History of kidney stones    Hyperlipidemia    takes LIpitor daily   Hypothyroidism    takes Synthroid daily   Insomnia    Joint pain    Joint swelling    Muscle spasms of head or neck    lumbar and thoracic;takes Robaxin prn   Peripheral neuropathy    PONV (postoperative nausea and vomiting)    Rosacea    Seasonal allergies    takes allegra prn   Sleep apnea     Family History  Problem Relation Age of Onset   Heart disease Mother 767  Heart failure Mother    Heart attack Father    Heart disease Father 546   Past Surgical History:  Procedure Laterality Date   ABDOMINAL HYSTERECTOMY  1980's   ANAL FISSURE REPAIR  ~ 2011   "banded hemorrhoids at this time too"   BARIATRIC SURGERY     [place]ARD[place]   & ulnar nerve release   CERVICAL DISC SURGERY  03/2017   C5, C6    COLONOSCOPY     ESOPHAGOGASTRODUODENOSCOPY      JOINT REPLACEMENT     left knee   KNEE ARTHROPLASTY Left 2007   KNEE ARTHROSCOPY Right    KNEE ARTHROSCOPY Right    "torn meniscus"   KNEE SURGERY Left 1982-2013   "17 before 10/31/2013"   LEFT OOPHORECTOMY  1980's   PATELLECTOMY Right    PLANTAR FASCIA SURGERY Bilateral 1990's   REVISION TOTAL KNEE ARTHROPLASTY Left 11/01/2011  TOTAL KNEE ARTHROPLASTY Right 10/15/2013   Procedure: RIGHT TOTAL KNEE ARTHROPLASTY;  Surgeon: Garald Balding, MD;  Location: [facility];  Service: Orthopedics;  Laterality: Right;   TOTAL KNEE REVISION  11/01/2011   Procedure: TOTAL KNEE REVISION;  Surgeon: Garald Balding, MD;  Location: [facility];  Service: Orthopedics;  Laterality: Left;  Revision Left Total Knee Replacement    TUBAL LIGATION  1981   Social History   Occupational History   Not on file  Tobacco Use   Smoking status: Never   Smokeless tobacco: Never  Vaping Use   Vaping Use: Never used  Substance and Sexual Activity   Alcohol use: No   Drug use: No   Sexual activity: Yes    Birth control/protection: Surgical

## 2020-07-28 NOTE — Telephone Encounter (Signed)
Patient called she stated she has a couple of questions regarding surgery patient is requesting a call back from [facility] call [phone]

## 2020-07-29 ENCOUNTER — Telehealth: Payer: Self-pay | Admitting: Orthopaedic Surgery

## 2020-07-29 DIAGNOSIS — R7303 Prediabetes: Secondary | ICD-10-CM | POA: Diagnosis not present

## 2020-07-29 DIAGNOSIS — E7800 Pure hypercholesterolemia, unspecified: Secondary | ICD-10-CM | POA: Diagnosis not present

## 2020-07-29 DIAGNOSIS — R7309 Other abnormal glucose: Secondary | ICD-10-CM | POA: Diagnosis not present

## 2020-07-29 DIAGNOSIS — M797 Fibromyalgia: Secondary | ICD-10-CM | POA: Diagnosis not present

## 2020-07-29 DIAGNOSIS — E559 Vitamin D deficiency, unspecified: Secondary | ICD-10-CM | POA: Diagnosis not present

## 2020-07-29 DIAGNOSIS — F419 Anxiety disorder, unspecified: Secondary | ICD-10-CM | POA: Diagnosis not present

## 2020-07-29 DIAGNOSIS — E039 Hypothyroidism, unspecified: Secondary | ICD-10-CM | POA: Diagnosis not present

## 2020-07-29 NOTE — Telephone Encounter (Signed)
Called patient a couple times, left message asking her to call back if she still has questions

## 2020-07-29 NOTE — Telephone Encounter (Signed)
Pt returning vm left and the best call back number is [phone]. Pt has an appt at 3 so asked if she could be called sometime between now and 2:30 if possible.

## 2020-08-11 DIAGNOSIS — Z7689 Persons encountering health services in other specified circumstances: Secondary | ICD-10-CM | POA: Diagnosis not present

## 2020-08-11 DIAGNOSIS — Z6841 Body Mass Index (BMI) 40.0 and over, adult: Secondary | ICD-10-CM | POA: Diagnosis not present

## 2020-08-11 DIAGNOSIS — Z79899 Other long term (current) drug therapy: Secondary | ICD-10-CM | POA: Diagnosis not present

## 2020-08-11 DIAGNOSIS — Z9884 Bariatric surgery status: Secondary | ICD-10-CM | POA: Diagnosis not present

## 2020-08-12 ENCOUNTER — Telehealth: Payer: Self-pay | Admitting: *Deleted

## 2020-08-12 NOTE — Telephone Encounter (Signed)
   [facility] Pre-operative Risk Assessment    Patient Name: [name]  DOB: 22-Apr-1955 MRN: [id number]  HEARTCARE STAFF:  - IMPORTANT!!!!!! Under Visit Info/Reason for Call, type in Other and utilize the format Clearance MM/DD/YY or Clearance TBD. Do not use dashes or single digits. - Please review there is not already an duplicate clearance open for this procedure. - If request is for dental extraction, please clarify the # of teeth to be extracted. - If the patient is currently at the dentist's office, call Pre-Op Callback Staff (MA/nurse) to input urgent request.  - If the patient is not currently in the dentist office, please route to the Pre-Op pool.  Request for surgical clearance:  What type of surgery is being performed? Right total knee revision  When is this surgery scheduled? 09/25/2020  What type of clearance is required (medical clearance vs. Pharmacy clearance to hold med vs. Both)? Medical  Are there any medications that need to be held prior to surgery and how long? None listed  Practice name and name of physician performing surgery? [facility] [place], Dr Jean Rosenthal  What is the office phone number? [phone]   7.   What is the office fax number? [phone] Attn: Sherrie  8.   Anesthesia type (None, local, MAC, general) ? Spinal block   Gianmarco Roye L 08/12/2020, 5:21 PM  _________________________________________________________________   (provider comments below)

## 2020-08-12 NOTE — Telephone Encounter (Signed)
Spoke with patient and scheduled surgery. °

## 2020-08-13 NOTE — Telephone Encounter (Signed)
S/w the pt and she is agreeable to appt for pre op clearance. Ok per Marveen Reeks RN, to use time slot 09/04/20 @ 1:20 pm with Dr. Tamala Julian. Pt is grateful for the help and the appt. I will send notes to MD for upcoming appt. I will send FYI to requesting office pt has appt.

## 2020-08-13 NOTE — Telephone Encounter (Signed)
   Name: [name]  DOB: 1955-04-21  MRN: [id number]  Primary Cardiologist: Sinclair Grooms, MD  Chart reviewed as part of pre-operative protocol coverage. Because of [name]'s past medical history and time since last visit, she will require a follow-up visit in order to better assess preoperative cardiovascular risk.  Last OV was in 2020 via telemedicine (>1 year ago), only in person OV before that was 2016.  Pre-op covering staff: - Please schedule appointment and call patient to inform them. If patient already had an upcoming appointment within acceptable timeframe, please add "pre-op clearance" to the appointment notes so provider is aware. - Please contact requesting surgeon's office via preferred method (i.e, phone, fax) to inform them of need for appointment prior to surgery.  Do not see any active blood thinners on MAR.  Charlie Pitter, PA-C  08/13/2020, 12:51 PM

## 2020-08-17 ENCOUNTER — Telehealth: Payer: Self-pay | Admitting: Orthopaedic Surgery

## 2020-08-17 NOTE — Telephone Encounter (Signed)
Patient called. She has some questions about her upcoming surgery. Would like a call back. [phone]

## 2020-08-18 ENCOUNTER — Encounter: Payer: Self-pay | Admitting: Orthopaedic Surgery

## 2020-08-18 NOTE — Telephone Encounter (Signed)
Patient aware of response from Dr. Ninfa Linden

## 2020-08-19 ENCOUNTER — Other Ambulatory Visit: Payer: Self-pay | Admitting: Psychiatry

## 2020-08-25 DIAGNOSIS — M5416 Radiculopathy, lumbar region: Secondary | ICD-10-CM | POA: Diagnosis not present

## 2020-09-03 ENCOUNTER — Ambulatory Visit
Admission: RE | Admit: 2020-09-03 | Discharge: 2020-09-03 | Disposition: A | Discharge status: Home / Self Care | Payer: Medicare PPO | Source: Ambulatory Visit | Attending: Family Medicine | Admitting: Family Medicine

## 2020-09-03 ENCOUNTER — Other Ambulatory Visit: Payer: Self-pay

## 2020-09-03 DIAGNOSIS — Z1231 Encounter for screening mammogram for malignant neoplasm of breast: Secondary | ICD-10-CM | POA: Diagnosis not present

## 2020-09-03 DIAGNOSIS — Z961 Presence of intraocular lens: Secondary | ICD-10-CM | POA: Diagnosis not present

## 2020-09-03 DIAGNOSIS — H524 Presbyopia: Secondary | ICD-10-CM | POA: Diagnosis not present

## 2020-09-03 DIAGNOSIS — H04123 Dry eye syndrome of bilateral lacrimal glands: Secondary | ICD-10-CM | POA: Diagnosis not present

## 2020-09-03 NOTE — Progress Notes (Signed)
Cardiology Office Note:    Date:  09/04/2020   ID:  Pnina Armao, DOB 1955-04-11, MRN [id number]  PCP:  Shirline Frees, MD  Cardiologist:  Sinclair Grooms, MD   Referring MD: Shirline Frees, MD   Chief Complaint  Patient presents with   Coronary Artery Disease    Aortic atherosclerosis noted on MRI   Advice Only     History of Present Illness:    Anneka Mullaly is a 65 y.o. female with a hx of chest pain and mildly abnormal nuclear stress test but normal coronary calcium score and coronary arteries by CTA 2020.  She is here today for preoperative clearance.  She will be undergoing right knee replacement surgery and late September by Dr. Zollie Beckers.  She has no cardiac complaints.  She had work-up for possible coronary disease approximately 18 months ago.  Coronary CT angiography demonstrated nonobstructive right coronary disease less than 50%.  The coronary calcium score was 0.  Aortic atherosclerosis is noted from imaging of the vascular beds.  Risk modification is piecemeal.  She is on Zetia for lipid management.  LDL is greater than 90.  She does not smoke.  Has family history of CAD.  She is obese.  But otherwise without significant risk factors.  Use of statins in the past.  Because of musculoskeletal symptoms.  She now explains that she has musculoskeletal discomfort despite being on no statin therapy and was recently diagnosed with fibromyalgia.  She is willing to attempt statin therapy again.  She does have some dyspnea on exertion.  She denies orthopnea.  She is not having any chest pain compatible with angina.  She does have fleeting musculoskeletal discomfort arms, back, legs, and chest that migrates from area to area.  Past Medical History:  Diagnosis Date   Anginal pain ([place])    "on and off" (11/01/2011) - anxiety related   Anxiety    takes Paxil daily and Xanax prn   Arthritis    "knees" (10/16/2013)   Chronic back pain    facet disease and  bulging disc; "thoracic and lower back" (10/16/2013)   Constipation    r/t pain meds;takes stool softener daily   Depression    Dysrhythmia    rapid HR on occasion-takes Propranolol daily   Family history of anesthesia complication    ":PONV; mom and sisters"   GERD (gastroesophageal reflux disease)    takes Prilosec    H/O hiatal hernia    History of bladder infections    > 89[place]ago   History of kidney stones    Hyperlipidemia    takes LIpitor daily   Hypothyroidism    takes Synthroid daily   Insomnia    Joint pain    Joint swelling    Muscle spasms of head or neck    lumbar and thoracic;takes Robaxin prn   Peripheral neuropathy    PONV (postoperative nausea and vomiting)    Rosacea    Seasonal allergies    takes allegra prn   Sleep apnea     Past Surgical History:  Procedure Laterality Date   ABDOMINAL HYSTERECTOMY  1980's   ANAL FISSURE REPAIR  ~ 2011   "banded hemorrhoids at this time too"   BARIATRIC SURGERY     CARDI[place]HETER[place]ATION  [place]   & ulnar nerve release   CERVICAL DISC SURGERY  03/2017   C5, C6    COLONOSCOPY  ESOPHAGOGASTRODUODENOSCOPY     JOINT REPLACEMENT     left knee   KNEE ARTHROPLASTY Left 2007   KNEE ARTHROSCOPY Right    KNEE ARTHROSCOPY Right    "torn meniscus"   KNEE SURGERY Left 1982-2013   "17 before 10/31/2013"   LEFT OOPHORECTOMY  1980's   PATELLECTOMY Right    PLANTAR FASCIA SURGERY Bilateral 1990's   REVISION TOTAL KNEE ARTHROPLASTY Left 11/01/2011   TOTAL KNEE ARTHROPLASTY Right 10/15/2013   Procedure: RIGHT TOTAL KNEE ARTHROPLASTY;  Surgeon: Garald Balding, MD;  Location: [facility];  Service: Orthopedics;  Laterality: Right;   TOTAL KNEE REVISION  11/01/2011   Procedure: TOTAL KNEE REVISION;  Surgeon: Garald Balding, MD;  Location: [facility];  Service: Orthopedics;  Laterality: Left;  Revision Left Total Knee Replacement    TUBAL LIGATION  1981    Current Medications: Current  Meds  Medication Sig   acetaminophen (TYLENOL) 650 MG CR tablet Take 2 tablets by mouth 2 (two) times daily as needed.   ALPRAZolam (XANAX) 0.5 MG tablet Take 0.5 tablets (0.25 mg total) by mouth 3 (three) times daily as needed for anxiety.   amoxicillin (AMOXIL) 500 MG capsule Take 2,000 mg by mouth See admin instructions. Take 4 capsules (2000 mg) by mouth 1 hour prior to dental procedure.   Azelaic Acid 15 % cream Apply 1 application topically 2 (two) times daily. Facial Gel   Cholecalciferol (VITAMIN D-3) 5000 UNITS TABS Take 5,000 Units by mouth daily.   ezetimibe (ZETIA) 10 MG tablet Take 10 mg by mouth daily.    fexofenadine (ALLEGRA) 180 MG tablet Take 180 mg by mouth daily as needed for allergies.    fluticasone (FLONASE) 50 MCG/ACT nasal spray Place 1-2 sprays into both nostrils daily as needed for allergies or rhinitis.   HYDROcodone-acetaminophen (NORCO/VICODIN) 5-325 MG tablet Take 1 tablet by mouth every 6 (six) hours as needed for moderate pain.   levothyroxine (SYNTHROID) 150 MCG tablet Take 150 mcg by mouth daily.   PARoxetine (PAXIL) 20 MG tablet TAKE 1 TABLET BY MOUTH IN THE MORNING, AT NOON, AND AT BEDTIME   pramipexole (MIRAPEX) 0.25 MG tablet TAKE 1 TABLET BY MOUTH EVERY MORNING AND1 AND 1/2 TABS AT NIGHT   propranolol ER (INDERAL LA) 60 MG 24 hr capsule TAKE 1 CAPSULE BY MOUTH ONCE DAILY   rosuvastatin (CRESTOR) 5 MG tablet Take 1 tablet (5 mg total) by mouth every Monday, Wednesday, and Friday.   Semaglutide,0.25 or 0.'5MG'$ /DOS, (OZEMPIC, 0.25 OR 0.5 MG/DOSE,) 2 MG/1.5ML SOPN Inject into the skin. 100 units   tiZANidine (ZANAFLEX) 4 MG tablet Take 2 mg by mouth as needed.     Allergies:   Morphine and related, Nsaids, Tape, Tapentadol, Levothyroxine, Morphine sulfate, Naproxen sodium, Nitrofurantoin, Tramadol, Tyloxapol, Buspar [buspirone], Ciprofloxacin, Codeine, Gabapentin, Lodine [etodolac], Lyrica [pregabalin], Niacin and related, Pravastatin, Septra  [sulfamethoxazole-trimethoprim], Tylox [oxycodone-acetaminophen], and Wellbutrin [bupropion]   Social History   Socioeconomic History   Marital status: Married    Spouse name: Not on file   Number of children: Not on file   Years of education: Not on file   Highest education level: Not on file  Occupational History   Not on file  Tobacco Use   Smoking status: Never   Smokeless tobacco: Never  Vaping Use   Vaping Use: Never used  Substance and Sexual Activity   Alcohol use: No   Drug use: No   Sexual activity: Yes    Birth control/protection: Surgical  Other Topics  Concern   Not on file  Social History Narrative   Not on file   Social Determinants of Health   Financial Resource Strain: Not on file  Food Insecurity: Not on file  Transportation Needs: Not on file  Physical Activity: Not on file  Stress: Not on file  Social Connections: Not on file     Family History: The patient's family history includes Heart attack in her father; Heart disease (age of onset: 28) in her father; Heart disease (age of onset: 21) in her mother; Heart failure in her mother.  ROS:   Please see the history of present illness.    She has lost greater than 40 pounds.  All other systems reviewed and are negative.  EKGs/Labs/Other Studies Reviewed:    The following studies were reviewed today:  Coronary CTA 06/20/2018: IMPRESSION: 1. Coronary calcium score of 0. This was 0 percentile for age and sex matched control.   2. Normal coronary origin with right dominance.   3. Mild noncalcified plaque in the proximal RCA with 25-49% stenosis.   Traci Turner  EKG:  EKG right bundle branch block, left axis deviation,  Recent Labs: 04/21/2020: Hemoglobin 13.0; Platelets 337  Recent Lipid Panel No results found for: CHOL, TRIG, HDL, CHOLHDL, VLDL, LDLCALC, LDLDIRECT  Physical Exam:    VS:  BP 130/82   Pulse 71   Wt 244 lb 12.8 oz (111 kg)   SpO2 98%   BMI 42.02 kg/m     Wt Readings  from Last 3 Encounters:  09/04/20 244 lb 12.8 oz (111 kg)  07/28/20 272 lb (123.4 kg)  07/08/20 272 lb (123.4 kg)     GEN: Obese. No acute distress HEENT: Normal NECK: No JVD. LYMPHATICS: No lymphadenopathy CARDIAC: No murmur. RRR no gallop, or edema. VASCULAR:  Normal Pulses. No bruits. RESPIRATORY:  Clear to auscultation without rales, wheezing or rhonchi  ABDOMEN: Soft, non-tender, non-distended, No pulsatile mass, MUSCULOSKELETAL: No deformity  SKIN: Warm and dry NEUROLOGIC:  Alert and oriented x 3 PSYCHIATRIC:  Normal affect   ASSESSMENT:    1. Preoperative cardiovascular examination   2. 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 Primary hypertension   4. Hypercholesterolemia   5. Morbid obesity ([place])   6. History of left knee replacement   7. Aortic atherosclerosis ([place])   8. Right bundle branch block    PLAN:    In order of problems listed above:  Cleared for upcoming knee replacement surgery.  No cardiac imaging or functional assessment needed at this time given widely patent coronary arteries within the last 24 months and no symptoms suggesting ischemia or heart failure. 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  This is not unusual. Blood pressure is excellent at 130/82 on Inderal LA 60 mg daily.  Weight loss has significantly improved blood pressure management.   She is on Zetia 10 mg/day.  Most recent LDL of 93 is an adequate therapy given aortic atherosclerosis and noncalcified plaque on coronary CT.  Target should be less than 70.  Add rosuvastatin 5 mg Monday, Wednesday, and Friday.  6 to 8-week lipid panel.  Low-dose with spacing is prescribed because LDL is not terribly elevated and she has had possible intolerance in the past. Moving.  40 pound weight loss. Cleared for upcoming right knee surgery Needs more aggressive lipid management.  Discussed with the patient.  She is in agreement with  proceeding.  Primary prevention clinical follow-up  in 1 year.   Medication Adjustments/Labs and Tests Ordered: Current medicines are reviewed at length with the patient today.  Concerns regarding medicines are outlined above.  Orders Placed This Encounter  Procedures   Lipid panel   Hepatic function panel   EKG 12-Lead   Meds ordered this encounter  Medications   rosuvastatin (CRESTOR) 5 MG tablet    Sig: Take 1 tablet (5 mg total) by mouth every Monday, Wednesday, and Friday.    Dispense:  45 tablet    Refill:  3     Patient Instructions  Medication Instructions:  1) START Rosuvastatin '5mg'$  once daily on Monday, Wednesday and Friday.  *If you need a refill on your cardiac medications before your next appointment, please call your pharmacy*   Lab Work: Lipid and Liver in 6 weeks. You will need to be fasting for these labs (nothing to eat or drink after midnight except water and black coffee).  If you have labs (blood work) drawn today and your tests are completely normal, you will receive your results only by: [place] (if you have MyChart) OR A paper copy in the mail If you have any lab test that is abnormal or we need to change your treatment, we will call you to review the results.   Testing/Procedures: None   Follow-Up: At [facility], you and your health needs are our priority.  As part of our continuing mission to provide you with exceptional heart care, we have created designated Provider Care Teams.  These Care Teams include your primary Cardiologist (physician) and Advanced Practice Providers (APPs -  Physician Assistants and Nurse Practitioners) who all work together to provide you with the care you need, when you need it.  We recommend signing up for the patient portal called "MyChart".  Sign up information is provided on this After Visit Summary.  MyChart is used to connect with patients for Virtual Visits (Telemedicine).  Patients are able to view  lab/test results, encounter notes, upcoming appointments, etc.  Non-urgent messages can be sent to your provider as well.   To learn more about what you can do with MyChart, go to [email or web address].    Your next appointment:   1 year(s)  The format for your next appointment:   In Person  Provider:   You may see Sinclair Grooms, MD or one of the following Advanced Practice Providers on your designated Care Team:   Cecilie Kicks, NP   Other Instructions     Signed, Sinclair Grooms, MD  09/04/2020 2:39 PM    [place]

## 2020-09-04 ENCOUNTER — Ambulatory Visit: Payer: Medicare PPO | Admitting: Interventional Cardiology

## 2020-09-04 ENCOUNTER — Encounter: Payer: Self-pay | Admitting: Interventional Cardiology

## 2020-09-04 VITALS — BP 130/82 | HR 71 | Wt 244.8 lb

## 2020-09-04 DIAGNOSIS — Z01810 Encounter for preprocedural cardiovascular examination: Secondary | ICD-10-CM | POA: Diagnosis not present

## 2020-09-04 DIAGNOSIS — Z96652 Presence of left artificial knee joint: Secondary | ICD-10-CM | POA: Diagnosis not present

## 2020-09-04 DIAGNOSIS — E7800 Pure hypercholesterolemia, unspecified: Secondary | ICD-10-CM | POA: Diagnosis not present

## 2020-09-04 DIAGNOSIS — I700 Atherosclerosis of aorta: Secondary | ICD-10-CM

## 2020-09-04 DIAGNOSIS — I4510 Unspecified right bundle-branch block: Secondary | ICD-10-CM

## 2020-09-04 DIAGNOSIS — I452 Bifascicular block: Secondary | ICD-10-CM

## 2020-09-04 DIAGNOSIS — I10 Essential (primary) hypertension: Secondary | ICD-10-CM | POA: Diagnosis not present

## 2020-09-04 MED ORDER — ROSUVASTATIN CALCIUM 5 MG PO TABS: 5.0000 mg | ORAL_TABLET | ORAL | 3 refills | Status: DC

## 2020-09-04 NOTE — Patient Instructions (Signed)
Medication Instructions:  1) START Rosuvastatin '5mg'$  once daily on Monday, Wednesday and Friday.  *If you need a refill on your cardiac medications before your next appointment, please call your pharmacy*   Lab Work: Lipid and Liver in 6 weeks. You will need to be fasting for these labs (nothing to eat or drink after midnight except water and black coffee).  If you have labs (blood work) drawn today and your tests are completely normal, you will receive your results only by: [place] (if you have MyChart) OR A paper copy in the mail If you have any lab test that is abnormal or we need to change your treatment, we will call you to review the results.   Testing/Procedures: None   Follow-Up: At [facility], you and your health needs are our priority.  As part of our continuing mission to provide you with exceptional heart care, we have created designated Provider Care Teams.  These Care Teams include your primary Cardiologist (physician) and Advanced Practice Providers (APPs -  Physician Assistants and Nurse Practitioners) who all work together to provide you with the care you need, when you need it.  We recommend signing up for the patient portal called "MyChart".  Sign up information is provided on this After Visit Summary.  MyChart is used to connect with patients for Virtual Visits (Telemedicine).  Patients are able to view lab/test results, encounter notes, upcoming appointments, etc.  Non-urgent messages can be sent to your provider as well.   To learn more about what you can do with MyChart, go to [email or web address].    Your next appointment:   1 year(s)  The format for your next appointment:   In Person  Provider:   You may see Sinclair Grooms, MD or one of the following Advanced Practice Providers on your designated Care Team:   Cecilie Kicks, NP   Other Instructions

## 2020-09-10 ENCOUNTER — Other Ambulatory Visit: Payer: Self-pay | Admitting: Physician Assistant

## 2020-09-10 NOTE — Progress Notes (Signed)
Sent message, via epic in basket, requesting orders in epic from surgeon.  

## 2020-09-14 ENCOUNTER — Other Ambulatory Visit (HOSPITAL_COMMUNITY): Payer: Self-pay

## 2020-09-14 NOTE — Patient Instructions (Addendum)
DUE TO COVID-19 ONLY ONE VISITOR IS ALLOWED TO COME WITH YOU AND STAY IN THE WAITING ROOM ONLY DURING PRE OP AND PROCEDURE DAY OF SURGERY IF YOU ARE GOING HOME AFTER SURGERY. IF YOU ARE SPENDING THE NIGHT 2 PEOPLE MAY VISIT WITH YOU IN YOUR PRIVATE ROOM AFTER SURGERY UNTIL VISITING  HOURS ARE OVER AT 800 PM AND THE 2 VISITORS CANNOT SPEND THE NIGHT.   YOU NEED TO HAVE A COVID 19 TEST ON__9/21_____THIS TEST MUST BE DONE BEFORE SURGERY,  COVID TESTING SITE  IS LOCATED AT [place], [place]. REMAIN IN YOUR CAR THIS IS A DRIVE UP TEST. AFTER YOUR COVID TEST PLEASE WEAR A MASK OUT IN PUBLIC AND SOCIAL DISTANCE AND [place] YOUR HANDS FREQUENTLY, ALSO ASK ALL YOUR CLOSE CONTACT PERSONS TO WEAR A MASK AND SOCIAL DISTANCE AND [place] THEIR HANDS FREQUENTLY ALSO.               [name]     Your procedure is scheduled on: 09/25/20   Report to [facility] Main  Entrance   Report to admitting at  9:30 AM     Call this number if you have problems the morning of surgery [phone]   No food after midnight.    You may have clear liquid until 9:00 AM.    At 8;30 AM drink pre surgery drink.   Nothing by mouth after 9:00 AM.    CLEAR LIQUID DIET   Foods Allowed                                                                     Foods Excluded                              liquids that you cannot  Plain Jell-O any favor except red or purple                                           see through such as: Fruit ices (not with fruit pulp)                                     milk, soups, orange juice  Iced Popsicles                                    All solid food Carbonated beverages, regular and diet                                    Cranberry, grape and apple juices Sports drinks like Gatorade Lightly seasoned clear broth or consume(fat free) Sugar     BRUSH YOUR TEETH MORNING OF SURGERY AND RINSE YOUR MOUTH OUT, NO CHEWING GUM CANDY OR MINTS.     Take these  medicines the morning of surgery with A SIP OF WATER: Paxil, Pramipexole, Propanolol,Synthroid Bring  your mask and tubing to the hospital        How to Manage Your Diabetes Before and After Surgery  Why is it important to control my blood sugar before and after surgery? Improving blood sugar levels before and after surgery helps healing and can limit problems. A way of improving blood sugar control is eating a healthy diet by:  Eating less sugar and carbohydrates  Increasing activity/exercise  Talking with your doctor about reaching your blood sugar goals High blood sugars (greater than 180 mg/dL) can raise your risk of infections and slow your recovery, so you will need to focus on controlling your diabetes during the weeks before surgery. Make sure that the doctor who takes care of your diabetes knows about your planned surgery including the date and location.  How do I manage my blood sugar before surgery? Check your blood sugar at least 4 times a day, starting 2 days before surgery, to make sure that the level is not too high or low. Check your blood sugar the morning of your surgery when you wake up and every 2 hours until you get to the Short Stay unit. If your blood sugar is less than 70 mg/dL, you will need to treat for low blood sugar: Do not take insulin. Treat a low blood sugar (less than 70 mg/dL) with  cup of clear juice (cranberry or apple), 4 glucose tablets, OR glucose gel. Recheck blood sugar in 15 minutes after treatment (to make sure it is greater than 70 mg/dL). If your blood sugar is not greater than 70 mg/dL on recheck, call [phone] for further instructions. Report your blood sugar to the short stay nurse when you get to Short Stay.  If you are admitted to the hospital after surgery: Your blood sugar will be checked by the staff and you will probably be given insulin after surgery (instead of oral diabetes medicines) to make sure you have good blood sugar  levels. The goal for blood sugar control after surgery is 80-180 mg/dL.   WHAT DO I DO ABOUT MY DIABETES MEDICATION?  Do not take oral diabetes medicines (pills) the morning of surgery.   The day of surgery, do not take other diabetes injectables, including Byetta (exenatide), Bydureon (exenatide ER), Victoza (liraglutide), or Trulicity (dulaglutide).                             You may not have any metal on your body including hair pins and              piercings  Do not wear jewelry, make-up, lotions, powders or perfumes, deodorant             Do not wear nail polish on your fingernails.  Do not shave  48 hours prior to surgery.                 Do not bring valuables to the hospital. [place].  Contacts, dentures or bridgework may not be worn into surgery.                [place] - Preparing for Surgery Before surgery, you can play an important role.  Because skin is not sterile, your skin needs to be as free of germs as possible.  You can reduce the number of germs on your  skin by washing with CHG (chlorahexidine gluconate) soap before surgery.  CHG is an antiseptic cleaner which kills germs and bonds with the skin to continue killing germs even after washing. Please DO NOT use if you have an allergy to CHG or antibacterial soaps.  If your skin becomes reddened/irritated stop using the CHG and inform your nurse when you arrive at Short Stay. Do not shave (including legs and underarms) for at least 48 hours prior to the first CHG shower.  You may shave your face/neck. Please follow these instructions carefully:  1.  Shower with CHG Soap the night before surgery and the  morning of Surgery.  2.  If you choose to wash your hair, wash your hair first as usual with your  normal  shampoo.  3.  After you shampoo, rinse your hair and body thoroughly to remove the  shampoo.                            4.  Use CHG as you would any other  liquid soap.  You can apply chg directly  to the skin and wash                       Gently with a scrungie or clean washcloth.  5.  Apply the CHG Soap to your body ONLY FROM THE NECK DOWN.   Do not use on face/ open                           Wound or open sores. Avoid contact with eyes, ears mouth and genitals (private parts).                       Wash face,  Genitals (private parts) with your normal soap.             6.  Wash thoroughly, paying special attention to the area where your surgery  will be performed.  7.  Thoroughly rinse your body with warm water from the neck down.  8.  DO NOT shower/wash with your normal soap after using and rinsing off  the CHG Soap.                9.  Pat yourself dry with a clean towel.            10.  Wear clean pajamas.            11.  Place clean sheets on your bed the night of your first shower and do not  sleep with pets. Day of Surgery : Do not apply any lotions/deodorants the morning of surgery.  Please wear clean clothes to the hospital/surgery center.  FAILURE TO FOLLOW THESE INSTRUCTIONS MAY RESULT IN THE CANCELLATION OF YOUR SURGERY PATIENT SIGNATURE_________________________________  NURSE SIGNATURE__________________________________  ________________________________________________________________________   Adam Phenix  An incentive spirometer is a tool that can help keep your lungs clear and active. This tool measures how well you are filling your lungs with each breath. Taking long deep breaths may help reverse or decrease the chance of developing breathing (pulmonary) problems (especially infection) following: A long period of time when you are unable to move or be active. BEFORE THE PROCEDURE  If the spirometer includes an indicator to show your best effort, your nurse or respiratory therapist will set it to a desired goal. If possible, sit up straight  or lean slightly forward. Try not to slouch. Hold the incentive spirometer  in an upright position. INSTRUCTIONS FOR USE  Sit on the edge of your bed if possible, or sit up as far as you can in bed or on a chair. Hold the incentive spirometer in an upright position. Breathe out normally. Place the mouthpiece in your mouth and seal your lips tightly around it. Breathe in slowly and as deeply as possible, raising the piston or the ball toward the top of the column. Hold your breath for 3-5 seconds or for as long as possible. Allow the piston or ball to fall to the bottom of the column. Remove the mouthpiece from your mouth and breathe out normally. Rest for a few seconds and repeat Steps 1 through 7 at least 10 times every 1-2 hours when you are awake. Take your time and take a few normal breaths between deep breaths. The spirometer may include an indicator to show your best effort. Use the indicator as a goal to work toward during each repetition. After each set of 10 deep breaths, practice coughing to be sure your lungs are clear. If you have an incision (the cut made at the time of surgery), support your incision when coughing by placing a pillow or rolled up towels firmly against it. Once you are able to get out of bed, walk around indoors and cough well. You may stop using the incentive spirometer when instructed by your caregiver.  RISKS AND COMPLICATIONS Take your time so you do not get dizzy or light-headed. If you are in pain, you may need to take or ask for pain medication before doing incentive spirometry. It is harder to take a deep breath if you are having pain. AFTER USE Rest and breathe slowly and easily. It can be helpful to keep track of a log of your progress. Your caregiver can provide you with a simple table to help with this. If you are using the spirometer at home, follow these instructions: [place] IF:  You are having difficultly using the spirometer. You have trouble using the spirometer as often as instructed. Your pain medication is  not giving enough relief while using the spirometer. You develop fever of 100.5 F (38.1 C) or higher. SEEK IMMEDIATE MEDICAL CARE IF:  You cough up bloody sputum that had not been present before. You develop fever of 102 F (38.9 C) or greater. You develop worsening pain at or near the incision site. MAKE SURE YOU:  Understand these instructions. Will watch your condition. Will get help right away if you are not doing well or get worse. Document Released: 05/02/2006 Document Revised: 03/14/2011 Document Reviewed: 07/03/2006 [facility] Patient Information 2014 [place], [place].   ________________________________________________________________________

## 2020-09-15 ENCOUNTER — Encounter (HOSPITAL_COMMUNITY)
Admission: RE | Admit: 2020-09-15 | Discharge: 2020-09-15 | Disposition: A | Discharge status: Home / Self Care | Payer: Medicare PPO | Source: Ambulatory Visit | Attending: Orthopaedic Surgery | Admitting: Orthopaedic Surgery

## 2020-09-15 ENCOUNTER — Encounter (HOSPITAL_COMMUNITY): Payer: Self-pay

## 2020-09-15 ENCOUNTER — Other Ambulatory Visit: Payer: Self-pay

## 2020-09-15 DIAGNOSIS — M5442 Lumbago with sciatica, left side: Secondary | ICD-10-CM | POA: Diagnosis not present

## 2020-09-15 DIAGNOSIS — M48061 Spinal stenosis, lumbar region without neurogenic claudication: Secondary | ICD-10-CM | POA: Diagnosis not present

## 2020-09-15 DIAGNOSIS — Z981 Arthrodesis status: Secondary | ICD-10-CM | POA: Diagnosis not present

## 2020-09-15 DIAGNOSIS — M5416 Radiculopathy, lumbar region: Secondary | ICD-10-CM | POA: Diagnosis not present

## 2020-09-15 DIAGNOSIS — Z79899 Other long term (current) drug therapy: Secondary | ICD-10-CM | POA: Diagnosis not present

## 2020-09-15 DIAGNOSIS — Z01812 Encounter for preprocedural laboratory examination: Secondary | ICD-10-CM | POA: Diagnosis not present

## 2020-09-15 LAB — HEMOGLOBIN A1C
Hgb A1c MFr Bld: 5.1 % (ref 4.8–5.6)
Mean Plasma Glucose: 99.67 mg/dL

## 2020-09-15 LAB — CBC
HCT: 41 % (ref 36.0–46.0)
Hemoglobin: 13.2 g/dL (ref 12.0–15.0)
MCH: 32.6 pg (ref 26.0–34.0)
MCHC: 32.2 g/dL (ref 30.0–36.0)
MCV: 101.2 fL — ABNORMAL HIGH (ref 80.0–100.0)
Platelets: 312 10*3/uL (ref 150–400)
RBC: 4.05 MIL/uL (ref 3.87–5.11)
RDW: 13.6 % (ref 11.5–15.5)
WBC: 7.2 10*3/uL (ref 4.0–10.5)
nRBC: 0 % (ref 0.0–0.2)

## 2020-09-15 LAB — SURGICAL PCR SCREEN
MRSA, PCR: NEGATIVE
Staphylococcus aureus: NEGATIVE

## 2020-09-15 LAB — BASIC METABOLIC PANEL
Anion gap: 7 (ref 5–15)
BUN: 23 mg/dL (ref 8–23)
CO2: 27 mmol/L (ref 22–32)
Calcium: 9.1 mg/dL (ref 8.9–10.3)
Chloride: 107 mmol/L (ref 98–111)
Creatinine, Ser: 0.75 mg/dL (ref 0.44–1.00)
GFR, Estimated: >60 mL/min (ref >60)
Glucose, Bld: 96 mg/dL (ref 70–99)
Potassium: 4.1 mmol/L (ref 3.5–5.1)
Sodium: 141 mmol/L (ref 135–145)

## 2020-09-15 NOTE — Progress Notes (Signed)
COVID test Completed:09/23/20  PCP - Dr. Rogers Blocker Cardiologist - Dr. Linard Millers LOV 09/04/20  Chest x-ray - no EKG - 09/04/20-epic Stress Test - 2016 ECHO - no Cardiac Cath - 1999 Pacemaker/ICD device last checked:NA  Sleep Study - yes- lost wt . No longer needs it CPAP - no  Fasting Blood Sugar - NA takes Ozempic for wt loss Checks Blood Sugar _____ times a day  Blood Thinner Instructions:NA Aspirin Instructions: Last Dose:  Anesthesia review: yes  Patient denies shortness of breath, fever, cough and chest pain at PAT appointment Yes. Pt reports no SOB with any activities  Patient verbalized understanding of instructions that were given to them at the PAT appointment. Patient was also instructed that they will need to review over the PAT instructions again at home before surgery. yes

## 2020-09-16 ENCOUNTER — Other Ambulatory Visit (HOSPITAL_COMMUNITY): Payer: Self-pay

## 2020-09-17 NOTE — Anesthesia Preprocedure Evaluation (Addendum)
Anesthesia Evaluation  Patient identified by MRN, date of birth, ID band Patient awake    Reviewed: Allergy & Precautions, NPO status , Patient's Chart, lab work & pertinent test results  History of Anesthesia Complications (+) PONV  Airway Mallampati: II  TM Distance: >3 FB Neck ROM: Full    Dental  (+) Teeth Intact   Pulmonary sleep apnea ,    Pulmonary exam normal        Cardiovascular hypertension, Pt. on medications and Pt. on home beta blockers  Rhythm:Regular Rate:Normal     Neuro/Psych Anxiety Depression negative neurological ROS     GI/Hepatic Neg liver ROS, hiatal hernia,   Endo/Other  Hypothyroidism   Renal/GU negative Renal ROS  negative genitourinary   Musculoskeletal  (+) Arthritis , Osteoarthritis,    Abdominal (+)  Abdomen: soft. Bowel sounds: normal.  Peds  Hematology negative hematology ROS (+)   Anesthesia Other Findings   Reproductive/Obstetrics                             Anesthesia Physical Anesthesia Plan  ASA: 2  Anesthesia Plan: MAC, Regional and Spinal   Post-op Pain Management:  Regional for Post-op pain   Induction: Intravenous  PONV Risk Score and Plan: 3 and Ondansetron, Dexamethasone, Propofol infusion, Treatment may vary due to age or medical condition and Midazolam  Airway Management Planned: Simple Face Mask, Natural Airway and Nasal Cannula  Additional Equipment: None  Intra-op Plan:   Post-operative Plan:   Informed Consent: I have reviewed the patients History and Physical, chart, labs and discussed the procedure including the risks, benefits and alternatives for the proposed anesthesia with the patient or authorized representative who has indicated his/her understanding and acceptance.     Dental advisory given  Plan Discussed with:   Anesthesia Plan Comments: (See PAT note 09/15/2020, Konrad Felix Ward, PA-C  Pt last seen  by cardiology 09/04/20. Per OV note,  "Cleared for upcoming knee replacement surgery. No cardiac imaging or functional assessment needed at this time given widely patent coronary arteries within the last 24 months and no symptoms suggesting ischemia or heart failure. 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 This is not unusual. Blood pressure is excellent at 130/82 on Inderal LA 60 mg daily. Weight loss has significantly improved blood pressure management.  She is on Zetia 10 mg/day. Most recent LDL of 93 is an adequate therapy given aortic atherosclerosis and noncalcified plaque on coronary CT. Target should be less than 70. Add rosuvastatin 5 mg Monday, Wednesday, and Friday. 6 to 8-week lipid panel. Low-dose with spacing is prescribed because LDL is not terribly elevated and she has had possible intolerance in the past. Moving. 40 pound weight loss. Cleared for upcoming right knee surgery")       Anesthesia Quick Evaluation

## 2020-09-17 NOTE — Progress Notes (Signed)
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 never smoker with h/o PONV, sleep apnea, failed right total knee arthroplasty scheduled for above procedure 09/25/2020 with Dr. Jean Rosenthal.   Pt last seen by cardiology 09/04/20. Per OV note,  "Cleared for upcoming knee replacement surgery.  No cardiac imaging or functional assessment needed at this time given widely patent coronary arteries within the last 24 months and no symptoms suggesting ischemia or heart failure. 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  This is not unusual. Blood pressure is excellent at 130/82 on Inderal LA 60 mg daily.  Weight loss has significantly improved blood pressure management.   She is on Zetia 10 mg/day.  Most recent LDL of 93 is an adequate therapy given aortic atherosclerosis and noncalcified plaque on coronary CT.  Target should be less than 70.  Add rosuvastatin 5 mg Monday, Wednesday, and Friday.  6 to 8-week lipid panel.  Low-dose with spacing is prescribed because LDL is not terribly elevated and she has had possible intolerance in the past. Moving.  40 pound weight loss. Cleared for upcoming right knee surgery"  Anticipate pt can proceed with planned procedure barring acute status change.   VS: Pulse 65   Temp 36.7 C (Oral)   Resp 20   Ht '5\' 5"'$  (1.651 m)   Wt 110.2 kg   SpO2 100%   BMI 40.44 kg/m   PROVIDERS: Shirline Frees, MD is PCP   Daneen Schick, MD is Cardiologist  LABS: Labs reviewed: Acceptable for surgery. (all labs ordered are listed, but only abnormal results are displayed)  Labs Reviewed  CBC - Abnormal; Notable for the following components:      Result Value   MCV 101.2 (*)    All other  components within normal limits  SURGICAL PCR SCREEN  HEMOGLOBIN [id number]  BASIC METABOLIC PANEL  TYPE AND SCREEN     IMAGES:   EKG: 09/04/20 Rate 71 bpm  NSR LAD  RBBB  CV: Stress Test 09/30/2014 Nuclear stress EF: 74%. There was no ST segment deviation noted during stress. T wave inversion was notedat rest and during stress in the V1, V2 and V3 leads. Defect 1: There is a small defect of mild severity present in the basal inferolateral and mid inferolateral location. Findings consistent with ischemia. This is a low risk study. The left ventricular ejection fraction is hyperdynamic (>65%). Past Medical History:  Diagnosis Date   Anginal pain ([place])    "on and off" (11/01/2011) - anxiety related   Anxiety    takes Paxil daily and Xanax prn   Arthritis    "knees" (10/16/2013)   Chronic back pain    facet disease and bulging disc; "thoracic and lower back" (10/16/2013)   Constipation    r/t pain meds;takes stool softener daily   Depression    Dysrhythmia    rapid HR on occasion-takes Propranolol daily   Family history of anesthesia complication    ":PONV; mom and sisters"   History of bladder infections    > 72yr[age]   History of kidney stones    Hyperlipidemia    takes LIpitor daily   Hypothyroidism    takes Synthroid daily  Insomnia    Joint pain    Joint swelling    Muscle spasms of head or neck    lumbar and thoracic;takes Robaxin prn   Peripheral neuropathy    PONV (postoperative nausea and vomiting)    Rosacea    Seasonal allergies    takes allegra prn   Sleep apnea     Past Surgical History:  Procedure Laterality Date   ABDOMINAL HYSTERECTOMY  1980's   ANAL FISSURE REPAIR  ~ 2011   "banded hemorrhoids at this time too"   BACK SURGERY  10/20/2018   lumbar fusion L4-5, L5-S1   BARIATRIC SURGERY     CARDIAC CATHETERIZATION  ~ [place] Left 09/13/2017   & ulnar nerve release   CERVICAL DISC SURGERY  03/2017   C5, C6     COLONOSCOPY     ESOPHAGOGASTRODUODENOSCOPY     KNEE ARTHROPLASTY Left 2007   KNEE ARTHROSCOPY Right    KNEE ARTHROSCOPY Right    "torn meniscus"   KNEE SURGERY Left 1982-2013   "17 before 10/31/2013"   LEFT OOPHORECTOMY  1980's   PATELLECTOMY Right    PLANTAR FASCIA SURGERY Bilateral 1990's   REVISION TOTAL KNEE ARTHROPLASTY Left 11/01/2011   TOTAL KNEE ARTHROPLASTY Right 10/15/2013   Procedure: RIGHT TOTAL KNEE ARTHROPLASTY;  Surgeon: Garald Balding, MD;  Location: [facility];  Service: Orthopedics;  Laterality: Right;   TOTAL KNEE REVISION  11/01/2011   Procedure: TOTAL KNEE REVISION;  Surgeon: Garald Balding, MD;  Location: [facility];  Service: Orthopedics;  Laterality: Left;  Revision Left Total Knee Replacement    TUBAL LIGATION  1981    MEDICATIONS:  acetaminophen (TYLENOL) 500 MG tablet   ALPRAZolam (XANAX) 0.5 MG tablet   amoxicillin (AMOXIL) 500 MG capsule   Ascorbic Acid (VITAMIN C) 1000 MG tablet   B Complex Vitamins (B COMPLEX PO)   betamethasone dipropionate 0.05 % cream   bisacodyl (DULCOLAX) 5 MG EC tablet   Cholecalciferol (VITAMIN D-3) 5000 UNITS TABS   ezetimibe (ZETIA) 10 MG tablet   fexofenadine (ALLEGRA) 180 MG tablet   fluticasone (FLONASE) 50 MCG/ACT nasal spray   HYDROcodone-acetaminophen (NORCO/VICODIN) 5-325 MG tablet   levothyroxine (SYNTHROID) 150 MCG tablet   Multiple Vitamin (MULTIVITAMIN WITH MINERALS) TABS tablet   PARoxetine (PAXIL) 20 MG tablet   pramipexole (MIRAPEX) 0.25 MG tablet   propranolol ER (INDERAL LA) 60 MG 24 hr capsule   rosuvastatin (CRESTOR) 5 MG tablet   Semaglutide,0.25 or 0.'5MG'$ /DOS, (OZEMPIC, 0.25 OR 0.5 MG/DOSE,) 2 MG/1.5ML SOPN   tiZANidine (ZANAFLEX) 4 MG tablet   No current facility-administered medications for this encounter.    Konrad Felix Ward, PA-C [facility] Pre-Surgical Testing [phone]

## 2020-09-21 DIAGNOSIS — R7303 Prediabetes: Secondary | ICD-10-CM | POA: Diagnosis not present

## 2020-09-21 DIAGNOSIS — E039 Hypothyroidism, unspecified: Secondary | ICD-10-CM | POA: Diagnosis not present

## 2020-09-21 DIAGNOSIS — E7800 Pure hypercholesterolemia, unspecified: Secondary | ICD-10-CM | POA: Diagnosis not present

## 2020-09-21 DIAGNOSIS — M797 Fibromyalgia: Secondary | ICD-10-CM | POA: Diagnosis not present

## 2020-09-21 DIAGNOSIS — G8929 Other chronic pain: Secondary | ICD-10-CM | POA: Diagnosis not present

## 2020-09-21 DIAGNOSIS — J309 Allergic rhinitis, unspecified: Secondary | ICD-10-CM | POA: Diagnosis not present

## 2020-09-21 DIAGNOSIS — Z0000 Encounter for general adult medical examination without abnormal findings: Secondary | ICD-10-CM | POA: Diagnosis not present

## 2020-09-21 DIAGNOSIS — G2581 Restless legs syndrome: Secondary | ICD-10-CM | POA: Diagnosis not present

## 2020-09-21 DIAGNOSIS — M8949 Other hypertrophic osteoarthropathy, multiple sites: Secondary | ICD-10-CM | POA: Diagnosis not present

## 2020-09-23 ENCOUNTER — Other Ambulatory Visit: Payer: Self-pay | Admitting: Orthopaedic Surgery

## 2020-09-23 LAB — SARS CORONAVIRUS 2 (TAT 6-24 HRS): SARS Coronavirus 2: NEGATIVE

## 2020-09-24 DIAGNOSIS — T84062A Wear of articular bearing surface of internal prosthetic right knee joint, initial encounter: Secondary | ICD-10-CM

## 2020-09-24 NOTE — H&P (Signed)
TOTAL KNEE REVISION ADMISSION H&P  Patient is being admitted for right revision total knee arthroplasty.  Subjective:  Chief Complaint:right knee pain.  HPI: [name], 65 y.o. female, has a history of pain and functional disability in the right knee(s) due to failed previous arthroplasty and patient has failed non-surgical conservative treatments for greater than 12 weeks to include NSAID's and/or analgesics, flexibility and strengthening excercises, use of assistive devices, weight reduction as appropriate, and activity modification. The indications for the revision of the total knee arthroplasty are loosening of one or more components and bearing surface wear leading to symptomatic synovitis. Onset of symptoms was gradual starting 2 years ago with gradually worsening course since that time.  Prior procedures on the right knee(s) include arthroplasty.  Patient currently rates pain in the right knee(s) at 9 out of 10 with activity. There is worsening of pain with activity and weight bearing and pain that interferes with activities of daily living.  Patient has evidence of joint space narrowing and prosthetic loosening by imaging studies. This condition presents safety issues increasing the risk of falls.  There is no current active infection.  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   Obsessive-compulsive disorder 10/10/2017   Generalized anxiety disorder 10/10/2017   Major depressive disorder, recurrent episode, moderate ([place]) 10/10/2017   Hiatal hernia 03/03/2015   Steatosis of liver 09/16/2014   Obstructive sleep apnea syndrome 08/27/2014   Pre-op evaluation  05/14/2014   Osteoarthritis of left knee 10/17/2013   History of left knee replacement 10/15/2013   Hypercholesterolemia 08/05/2013   Palpitations 08/05/2013   HTN (hypertension) 08/05/2013   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 08/05/2013   Preoperative cardiovascular examination 08/05/2013   Degenerative arthritis of knee, bilateral 08/05/2013   Chronic pain of left knee 11/03/2011   Morbid obesity ([place]) 11/03/2011   Hypothyroid 11/03/2011   Past Medical History:  Diagnosis Date   Anginal pain ([place])    "on and off" (11/01/2011) - anxiety related   Anxiety    takes Paxil daily and Xanax prn   Arthritis    "knees" (10/16/2013)   Chronic back pain    facet disease and bulging disc; "thoracic and lower back" (10/16/2013)   Constipation    r/t pain meds;takes stool softener daily   Depression    Dysrhythmia    rapid HR on occasion-takes Propranolol daily   Family history of anesthesia complication    ":PONV; mom and sisters"   History of bladder infections    > 36yr ago   History of kidney stones    Hyperlipidemia    takes LIpitor daily   Hypothyroidism    takes Synthroid daily   Insomnia    Joint pain    Joint swelling    Muscle spasms of head or neck    lumbar and thoracic;takes Robaxin prn   Peripheral neuropathy    PONV (postoperative nausea and vomiting)    Rosacea    Seasonal allergies    takes allegra prn   Sleep apnea     Past Surgical History:  Procedure Laterality Date   ABDOMINAL HYSTERECTOMY  1980's   ANAL FISSURE REPAIR  ~  2011   "banded hemorrhoids at this time too"   BACK SURGERY  10/20/2018   lumbar fusion L4-5, L5-S1   BARIATRIC SURGERY     CARDIAC CATHETERIZATION  ~ [place] Left 09/13/2017   & ulnar nerve release   CERVICAL DISC SURGERY  03/2017   C5, C6    COLONOSCOPY     ESOPHAGOGASTRODUODENOSCOPY     KNEE ARTHROPLASTY Left 2007   KNEE ARTHROSCOPY Right    KNEE ARTHROSCOPY Right     "torn meniscus"   KNEE SURGERY Left 1982-2013   "17 before 10/31/2013"   LEFT OOPHORECTOMY  1980's   PATELLECTOMY Right    PLANTAR FASCIA SURGERY Bilateral 1990's   REVISION TOTAL KNEE ARTHROPLASTY Left 11/01/2011   TOTAL KNEE ARTHROPLASTY Right 10/15/2013   Procedure: RIGHT TOTAL KNEE ARTHROPLASTY;  Surgeon: Garald Balding, MD;  Location: [facility];  Service: Orthopedics;  Laterality: Right;   TOTAL KNEE REVISION  11/01/2011   Procedure: TOTAL KNEE REVISION;  Surgeon: Garald Balding, MD;  Location: [facility];  Service: Orthopedics;  Laterality: Left;  Revision Left Total Knee Replacement    TUBAL LIGATION  1981    No current facility-administered medications for this encounter.   Current Outpatient Medications  Medication Sig Dispense Refill Last Dose   acetaminophen (TYLENOL) 500 MG tablet Take 500 mg by mouth every 6 (six) hours as needed for moderate pain.      ALPRAZolam (XANAX) 0.5 MG tablet Take 0.5 tablets (0.25 mg total) by mouth 3 (three) times daily as needed for anxiety. 90 tablet 3    amoxicillin (AMOXIL) 500 MG capsule Take 2,000 mg by mouth See admin instructions. Take 2000 mg by mouth 1 hour prior to dental procedure.      Ascorbic Acid (VITAMIN C) 1000 MG tablet Take 1,000 mg by mouth daily.      B Complex Vitamins (B COMPLEX PO) Place 1 tablet under the tongue daily.      betamethasone dipropionate 0.05 % cream Apply 1 application topically 2 (two) times daily as needed (irritation).      bisacodyl (DULCOLAX) 5 MG EC tablet Take 5 mg by mouth daily as needed for moderate constipation.      Cholecalciferol (VITAMIN D-3) 5000 UNITS TABS Take 5,000 Units by mouth daily.      ezetimibe (ZETIA) 10 MG tablet Take 10 mg by mouth daily.       fexofenadine (ALLEGRA) 180 MG tablet Take 180 mg by mouth daily as needed for allergies.       fluticasone (FLONASE) 50 MCG/ACT nasal spray Place 1-2 sprays into both nostrils daily as needed for allergies or rhinitis.       HYDROcodone-acetaminophen (NORCO/VICODIN) 5-325 MG tablet Take 1 tablet by mouth every 6 (six) hours as needed for moderate pain.      levothyroxine (SYNTHROID) 150 MCG tablet Take 150 mcg by mouth daily before breakfast.      Multiple Vitamin (MULTIVITAMIN WITH MINERALS) TABS tablet Take 1 tablet by mouth daily.      PARoxetine (PAXIL) 20 MG tablet TAKE 1 TABLET BY MOUTH IN THE MORNING, AT NOON, AND AT BEDTIME (Patient taking differently: Take 20 mg by mouth in the morning and at bedtime.) 270 tablet 3    pramipexole (MIRAPEX) 0.25 MG tablet TAKE 1 TABLET BY MOUTH EVERY MORNING AND1 AND 1/2 TABS AT NIGHT 225 tablet 3    propranolol ER (INDERAL LA) 60 MG 24 hr capsule TAKE 1 CAPSULE BY MOUTH  ONCE DAILY 90 capsule 0    rosuvastatin (CRESTOR) 5 MG tablet Take 1 tablet (5 mg total) by mouth every Monday, Wednesday, and Friday. 45 tablet 3    Semaglutide,0.25 or 0.5MG /DOS, (OZEMPIC, 0.25 OR 0.5 MG/DOSE,) 2 MG/1.5ML SOPN Inject 0.5 mg into the skin every Friday.      tiZANidine (ZANAFLEX) 4 MG tablet Take 2-4 mg by mouth 2 (two) times daily as needed for muscle spasms.      Allergies  Allergen Reactions   Morphine And Related Other (See Comments)    Feels like bugs crawling over her   Nsaids Hives   Tape Other (See Comments)    Blisters with tape after surgical procedures   Tapentadol Other (See Comments)    Blisters with tape after surgical procedures   Levothyroxine     Other reaction(s): hot flashes,    Morphine Sulfate Hives   Naproxen Sodium    Nitrofurantoin Hives   Tramadol     Other reaction(s): Pt reports drug interaction with Paxil   Tyloxapol     Other reaction(s): hyper   Buspar [Buspirone] Palpitations   Ciprofloxacin Nausea Only    Upset stomach    Codeine Other (See Comments)    Hyper   Gabapentin Other (See Comments)    Suicidal ideation.   Lodine [Etodolac] Nausea Only    Upset Stomach    Lyrica [Pregabalin] Other (See Comments)    suicidal ideation   Niacin And  Related Other (See Comments)    flushing   Pravastatin Other (See Comments)    achiness    Septra [Sulfamethoxazole-Trimethoprim] Other (See Comments)    Stomach pains   Tylox [Oxycodone-Acetaminophen] Other (See Comments)    Hyper   Wellbutrin [Bupropion] Other (See Comments)    dizzy    Social History   Tobacco Use   Smoking status: Never   Smokeless tobacco: Never  Substance Use Topics   Alcohol use: No    Family History  Problem Relation Age of Onset   Heart disease Mother 58   Heart failure Mother    Heart attack Father    Heart disease Father 52      Review of Systems  Musculoskeletal:  Positive for gait problem and joint swelling.  All other systems reviewed and are negative.   Objective:  Physical Exam Vitals reviewed.  Constitutional:      Appearance: Normal appearance.  HENT:     Head: Normocephalic and atraumatic.  Eyes:     Extraocular Movements: Extraocular movements intact.     Pupils: Pupils are equal, round, and reactive to light.  Cardiovascular:     Rate and Rhythm: Normal rate and regular rhythm.  Pulmonary:     Effort: Pulmonary effort is normal.     Breath sounds: Normal breath sounds.  Abdominal:     Palpations: Abdomen is soft.  Musculoskeletal:     Cervical back: Normal range of motion and neck supple.     Right knee: Effusion and bony tenderness present. Tenderness present over the lateral joint line.  Neurological:     Mental Status: She is alert and oriented to person, place, and time.  Psychiatric:        Behavior: Behavior normal.    Vital signs in last 24 hours:    Labs:  Estimated body mass index is 40.44 kg/m as calculated from the following:   Height as of 09/15/20: 5\' 5"  (1.651 m).   Weight as of 09/15/20: 110.2 kg.  Imaging Review  Plain films of the right knee show joint space narrowing but no complicating features of the hardware.  There is evidence of the previous patellectomy.  The actual components appear to be  intact and not loose.  A three-phase bone scan does show increased uptake in the posterior medial and anterior lateral femoral condyle areas of the femur prosthesis and the lateral tibial plateau of the tibia prosthesis.   Assessment/Plan:  Polyethylene liner wear and questionable loosening of components status post previous right total knee arthroplasty  I had a long and thorough discussion with the patient about treatment options.  She is also significant mount of weight and continues on her weight loss journey.  Her knee is painful and consistent with synovitis of the knee.  There could be a component of this being just polyliner wear and she may just need a polyliner exchange.  However, the bone scan is indicative of potential loosening of the metal components.  These will be revised accordingly depending on her operative findings.  The risks and benefits of surgery been explained in detail.

## 2020-09-25 ENCOUNTER — Encounter (HOSPITAL_COMMUNITY)
Admission: RE | Disposition: A | Discharge status: Home Health | Payer: Self-pay | Source: Home / Self Care | Attending: Orthopaedic Surgery

## 2020-09-25 ENCOUNTER — Inpatient Hospital Stay (HOSPITAL_COMMUNITY): Payer: Medicare PPO | Admitting: Anesthesiology

## 2020-09-25 ENCOUNTER — Inpatient Hospital Stay (HOSPITAL_COMMUNITY)
Admission: RE | Admit: 2020-09-25 | Discharge: 2020-09-27 | DRG: 488 | Disposition: A | Discharge status: Home Health | Payer: Medicare PPO | Attending: Orthopaedic Surgery | Admitting: Orthopaedic Surgery

## 2020-09-25 ENCOUNTER — Other Ambulatory Visit: Payer: Self-pay

## 2020-09-25 ENCOUNTER — Inpatient Hospital Stay (HOSPITAL_COMMUNITY): Payer: Medicare PPO | Admitting: Physician Assistant

## 2020-09-25 ENCOUNTER — Inpatient Hospital Stay (HOSPITAL_COMMUNITY): Payer: Medicare PPO

## 2020-09-25 ENCOUNTER — Encounter (HOSPITAL_COMMUNITY): Payer: Self-pay | Admitting: Orthopaedic Surgery

## 2020-09-25 DIAGNOSIS — T84062A Wear of articular bearing surface of internal prosthetic right knee joint, initial encounter: Principal | ICD-10-CM

## 2020-09-25 DIAGNOSIS — Z8249 Family history of ischemic heart disease and other diseases of the circulatory system: Secondary | ICD-10-CM

## 2020-09-25 DIAGNOSIS — F411 Generalized anxiety disorder: Secondary | ICD-10-CM | POA: Diagnosis present

## 2020-09-25 DIAGNOSIS — Z886 Allergy status to analgesic agent status: Secondary | ICD-10-CM

## 2020-09-25 DIAGNOSIS — Z471 Aftercare following joint replacement surgery: Secondary | ICD-10-CM | POA: Diagnosis not present

## 2020-09-25 DIAGNOSIS — Z96652 Presence of left artificial knee joint: Secondary | ICD-10-CM | POA: Diagnosis present

## 2020-09-25 DIAGNOSIS — Z888 Allergy status to other drugs, medicaments and biological substances status: Secondary | ICD-10-CM

## 2020-09-25 DIAGNOSIS — Z90721 Acquired absence of ovaries, unilateral: Secondary | ICD-10-CM

## 2020-09-25 DIAGNOSIS — G4733 Obstructive sleep apnea (adult) (pediatric): Secondary | ICD-10-CM | POA: Diagnosis not present

## 2020-09-25 DIAGNOSIS — T84062D Wear of articular bearing surface of internal prosthetic right knee joint, subsequent encounter: Secondary | ICD-10-CM | POA: Diagnosis not present

## 2020-09-25 DIAGNOSIS — F339 Major depressive disorder, recurrent, unspecified: Secondary | ICD-10-CM | POA: Diagnosis not present

## 2020-09-25 DIAGNOSIS — Z87442 Personal history of urinary calculi: Secondary | ICD-10-CM

## 2020-09-25 DIAGNOSIS — Z96651 Presence of right artificial knee joint: Secondary | ICD-10-CM | POA: Diagnosis not present

## 2020-09-25 DIAGNOSIS — I10 Essential (primary) hypertension: Secondary | ICD-10-CM | POA: Diagnosis not present

## 2020-09-25 DIAGNOSIS — Z9889 Other specified postprocedural states: Secondary | ICD-10-CM | POA: Diagnosis not present

## 2020-09-25 DIAGNOSIS — E039 Hypothyroidism, unspecified: Secondary | ICD-10-CM | POA: Diagnosis present

## 2020-09-25 DIAGNOSIS — Z79899 Other long term (current) drug therapy: Secondary | ICD-10-CM | POA: Diagnosis not present

## 2020-09-25 DIAGNOSIS — M25561 Pain in right knee: Secondary | ICD-10-CM | POA: Diagnosis not present

## 2020-09-25 DIAGNOSIS — Z87440 Personal history of urinary (tract) infections: Secondary | ICD-10-CM

## 2020-09-25 DIAGNOSIS — Z885 Allergy status to narcotic agent status: Secondary | ICD-10-CM

## 2020-09-25 DIAGNOSIS — G8918 Other acute postprocedural pain: Secondary | ICD-10-CM | POA: Diagnosis not present

## 2020-09-25 DIAGNOSIS — Y792 Prosthetic and other implants, materials and accessory orthopedic devices associated with adverse incidents: Secondary | ICD-10-CM | POA: Diagnosis present

## 2020-09-25 DIAGNOSIS — R600 Localized edema: Secondary | ICD-10-CM | POA: Diagnosis not present

## 2020-09-25 DIAGNOSIS — M47814 Spondylosis without myelopathy or radiculopathy, thoracic region: Secondary | ICD-10-CM | POA: Diagnosis present

## 2020-09-25 DIAGNOSIS — E7800 Pure hypercholesterolemia, unspecified: Secondary | ICD-10-CM | POA: Diagnosis present

## 2020-09-25 DIAGNOSIS — Z90710 Acquired absence of both cervix and uterus: Secondary | ICD-10-CM

## 2020-09-25 DIAGNOSIS — M65861 Other synovitis and tenosynovitis, right lower leg: Secondary | ICD-10-CM | POA: Diagnosis present

## 2020-09-25 DIAGNOSIS — Z79890 Hormone replacement therapy (postmenopausal): Secondary | ICD-10-CM | POA: Diagnosis not present

## 2020-09-25 HISTORY — PX: TOTAL KNEE REVISION: SHX996

## 2020-09-25 LAB — TYPE AND SCREEN
ABO/RH(D): A POS
Antibody Screen: NEGATIVE

## 2020-09-25 SURGERY — TOTAL KNEE REVISION
Anesthesia: Monitor Anesthesia Care | Site: Knee | Laterality: Right

## 2020-09-25 MED ORDER — PHENOL 1.4 % MT LIQD: 1.0000 | OROMUCOSAL | Status: DC | PRN

## 2020-09-25 MED ORDER — POLYETHYLENE GLYCOL 3350 17 G PO PACK: 17.0000 g | PACK | Freq: Every day | ORAL | Status: DC | PRN

## 2020-09-25 MED ORDER — DIPHENHYDRAMINE HCL 12.5 MG/5ML PO ELIX: 12.5000 mg | ORAL_SOLUTION | ORAL | Status: DC | PRN

## 2020-09-25 MED ORDER — PAROXETINE HCL 20 MG PO TABS
20.0000 mg | ORAL_TABLET | ORAL | Status: DC
  Administered 2020-09-25 – 2020-09-27 (×6): 20 mg via ORAL
  Filled 2020-09-25 (×7): qty 1

## 2020-09-25 MED ORDER — EZETIMIBE 10 MG PO TABS
10.0000 mg | ORAL_TABLET | Freq: Every day | ORAL | Status: DC
  Administered 2020-09-26 – 2020-09-27 (×2): 10 mg via ORAL
  Filled 2020-09-25 (×3): qty 1

## 2020-09-25 MED ORDER — ROPIVACAINE HCL 5 MG/ML IJ SOLN
INTRAMUSCULAR | Status: DC | PRN
  Administered 2020-09-25: 25 mL via PERINEURAL

## 2020-09-25 MED ORDER — POVIDONE-IODINE 10 % EX SWAB
2.0000 "application " | Freq: Once | CUTANEOUS | Status: AC
  Administered 2020-09-25: 2 via TOPICAL

## 2020-09-25 MED ORDER — PHENYLEPHRINE HCL-NACL 20-0.9 MG/250ML-% IV SOLN
INTRAVENOUS | Status: DC | PRN
  Administered 2020-09-25: 25 ug/min via INTRAVENOUS

## 2020-09-25 MED ORDER — OXYCODONE HCL 5 MG PO TABS
10.0000 mg | ORAL_TABLET | ORAL | Status: DC | PRN
  Administered 2020-09-25: 10 mg via ORAL
  Administered 2020-09-26 (×2): 15 mg via ORAL
  Filled 2020-09-25 (×2): qty 3

## 2020-09-25 MED ORDER — METHOCARBAMOL 500 MG IVPB - SIMPLE MED
INTRAVENOUS | Status: AC
  Administered 2020-09-25: 500 mg via INTRAVENOUS
  Filled 2020-09-25: qty 50

## 2020-09-25 MED ORDER — PROPRANOLOL HCL ER 60 MG PO CP24
60.0000 mg | ORAL_CAPSULE | Freq: Every day | ORAL | Status: DC
  Administered 2020-09-26 – 2020-09-27 (×2): 60 mg via ORAL
  Filled 2020-09-25 (×2): qty 1

## 2020-09-25 MED ORDER — CHLORHEXIDINE GLUCONATE 0.12 % MT SOLN
15.0000 mL | Freq: Once | OROMUCOSAL | Status: AC
  Administered 2020-09-25: 15 mL via OROMUCOSAL

## 2020-09-25 MED ORDER — METOCLOPRAMIDE HCL 5 MG PO TABS: 5.0000 mg | ORAL_TABLET | Freq: Three times a day (TID) | ORAL | Status: DC | PRN

## 2020-09-25 MED ORDER — ASCORBIC ACID 500 MG PO TABS
1000.0000 mg | ORAL_TABLET | Freq: Every day | ORAL | Status: DC
  Administered 2020-09-26 – 2020-09-27 (×2): 1000 mg via ORAL
  Filled 2020-09-25 (×2): qty 2

## 2020-09-25 MED ORDER — MIDAZOLAM HCL 5 MG/5ML IJ SOLN
INTRAMUSCULAR | Status: DC | PRN
Start: 2020-09-25 — End: 2020-09-25
  Administered 2020-09-25: 2 mg via INTRAVENOUS

## 2020-09-25 MED ORDER — FENTANYL CITRATE PF 50 MCG/ML IJ SOSY
50.0000 ug | PREFILLED_SYRINGE | INTRAMUSCULAR | Status: AC
  Administered 2020-09-25: 50 ug via INTRAVENOUS
  Filled 2020-09-25: qty 2

## 2020-09-25 MED ORDER — 0.9 % SODIUM CHLORIDE (POUR BTL) OPTIME
TOPICAL | Status: DC | PRN
  Administered 2020-09-25: 1000 mL

## 2020-09-25 MED ORDER — FENTANYL CITRATE (PF) 100 MCG/2ML IJ SOLN
INTRAMUSCULAR | Status: DC | PRN
  Administered 2020-09-25 (×2): 50 ug via INTRAVENOUS

## 2020-09-25 MED ORDER — ONDANSETRON HCL 4 MG/2ML IJ SOLN
INTRAMUSCULAR | Status: DC | PRN
  Administered 2020-09-25: 4 mg via INTRAVENOUS

## 2020-09-25 MED ORDER — METOCLOPRAMIDE HCL 5 MG/ML IJ SOLN: 5.0000 mg | Freq: Three times a day (TID) | INTRAMUSCULAR | Status: DC | PRN

## 2020-09-25 MED ORDER — MENTHOL 3 MG MT LOZG: 1.0000 | LOZENGE | OROMUCOSAL | Status: DC | PRN

## 2020-09-25 MED ORDER — SODIUM CHLORIDE 0.9 % IR SOLN
Status: DC | PRN
  Administered 2020-09-25: 2000 mL

## 2020-09-25 MED ORDER — CEFAZOLIN SODIUM-DEXTROSE 2-4 GM/100ML-% IV SOLN
2.0000 g | INTRAVENOUS | Status: AC
  Administered 2020-09-25: 2 g via INTRAVENOUS
  Filled 2020-09-25: qty 100

## 2020-09-25 MED ORDER — FENTANYL CITRATE (PF) 100 MCG/2ML IJ SOLN
INTRAMUSCULAR | Status: AC
  Filled 2020-09-25: qty 2

## 2020-09-25 MED ORDER — PANTOPRAZOLE SODIUM 40 MG PO TBEC
40.0000 mg | DELAYED_RELEASE_TABLET | Freq: Every day | ORAL | Status: DC
  Administered 2020-09-26 – 2020-09-27 (×2): 40 mg via ORAL
  Filled 2020-09-25 (×2): qty 1

## 2020-09-25 MED ORDER — METHOCARBAMOL 500 MG PO TABS
500.0000 mg | ORAL_TABLET | Freq: Four times a day (QID) | ORAL | Status: DC | PRN
  Administered 2020-09-25 – 2020-09-27 (×6): 500 mg via ORAL
  Filled 2020-09-25 (×7): qty 1

## 2020-09-25 MED ORDER — PROPOFOL 10 MG/ML IV BOLUS
INTRAVENOUS | Status: DC | PRN
  Administered 2020-09-25: 20 mg via INTRAVENOUS

## 2020-09-25 MED ORDER — SODIUM CHLORIDE 0.9 % IV SOLN: INTRAVENOUS | Status: DC

## 2020-09-25 MED ORDER — FENTANYL CITRATE PF 50 MCG/ML IJ SOSY
PREFILLED_SYRINGE | INTRAMUSCULAR | Status: AC
  Administered 2020-09-25: 50 ug via INTRAVENOUS
  Filled 2020-09-25: qty 3

## 2020-09-25 MED ORDER — BUPIVACAINE IN DEXTROSE 0.75-8.25 % IT SOLN
INTRATHECAL | Status: DC | PRN
  Administered 2020-09-25: 1.8 mL via INTRATHECAL

## 2020-09-25 MED ORDER — EPHEDRINE 5 MG/ML INJ
INTRAVENOUS | Status: AC
  Filled 2020-09-25: qty 10

## 2020-09-25 MED ORDER — LACTATED RINGERS IV SOLN: INTRAVENOUS | Status: DC

## 2020-09-25 MED ORDER — VITAMIN D3 25 MCG (1000 UNIT) PO TABS
5000.0000 [IU] | ORAL_TABLET | Freq: Every day | ORAL | Status: DC
  Administered 2020-09-26 – 2020-09-27 (×2): 5000 [IU] via ORAL
  Filled 2020-09-25 (×4): qty 5

## 2020-09-25 MED ORDER — FENTANYL CITRATE PF 50 MCG/ML IJ SOSY
25.0000 ug | PREFILLED_SYRINGE | INTRAMUSCULAR | Status: DC | PRN
  Administered 2020-09-25 (×2): 50 ug via INTRAVENOUS

## 2020-09-25 MED ORDER — LEVOTHYROXINE SODIUM 75 MCG PO TABS
150.0000 ug | ORAL_TABLET | Freq: Every day | ORAL | Status: DC
  Administered 2020-09-26 – 2020-09-27 (×2): 150 ug via ORAL
  Filled 2020-09-25 (×3): qty 2

## 2020-09-25 MED ORDER — METHOCARBAMOL 500 MG IVPB - SIMPLE MED
500.0000 mg | Freq: Four times a day (QID) | INTRAVENOUS | Status: DC | PRN
  Filled 2020-09-25: qty 50

## 2020-09-25 MED ORDER — ALUM & MAG HYDROXIDE-SIMETH 200-200-20 MG/5ML PO SUSP: 30.0000 mL | ORAL | Status: DC | PRN

## 2020-09-25 MED ORDER — HYDROMORPHONE HCL 1 MG/ML IJ SOLN
0.5000 mg | INTRAMUSCULAR | Status: DC | PRN
  Administered 2020-09-25 – 2020-09-27 (×4): 1 mg via INTRAVENOUS
  Filled 2020-09-25 (×4): qty 1

## 2020-09-25 MED ORDER — ROSUVASTATIN CALCIUM 5 MG PO TABS
5.0000 mg | ORAL_TABLET | ORAL | Status: DC
  Administered 2020-09-25: 5 mg via ORAL
  Filled 2020-09-25: qty 1

## 2020-09-25 MED ORDER — MIDAZOLAM HCL 2 MG/2ML IJ SOLN
INTRAMUSCULAR | Status: AC
  Filled 2020-09-25: qty 2

## 2020-09-25 MED ORDER — ALPRAZOLAM 0.25 MG PO TABS: 0.2500 mg | ORAL_TABLET | Freq: Three times a day (TID) | ORAL | Status: DC | PRN

## 2020-09-25 MED ORDER — DEXAMETHASONE SODIUM PHOSPHATE 10 MG/ML IJ SOLN
INTRAMUSCULAR | Status: DC | PRN
  Administered 2020-09-25: 4 mg via INTRAVENOUS

## 2020-09-25 MED ORDER — TRANEXAMIC ACID-NACL 1000-0.7 MG/100ML-% IV SOLN
1000.0000 mg | INTRAVENOUS | Status: AC
  Administered 2020-09-25: 1000 mg via INTRAVENOUS
  Filled 2020-09-25: qty 100

## 2020-09-25 MED ORDER — DOCUSATE SODIUM 100 MG PO CAPS
100.0000 mg | ORAL_CAPSULE | Freq: Two times a day (BID) | ORAL | Status: DC
  Administered 2020-09-25 – 2020-09-27 (×4): 100 mg via ORAL
  Filled 2020-09-25 (×4): qty 1

## 2020-09-25 MED ORDER — STERILE WATER FOR IRRIGATION IR SOLN
Status: DC | PRN
  Administered 2020-09-25: 2000 mL

## 2020-09-25 MED ORDER — OXYCODONE HCL 5 MG PO TABS
5.0000 mg | ORAL_TABLET | ORAL | Status: DC | PRN
  Administered 2020-09-25: 10 mg via ORAL
  Filled 2020-09-25 (×2): qty 2

## 2020-09-25 MED ORDER — MIDAZOLAM HCL 2 MG/2ML IJ SOLN
1.0000 mg | INTRAMUSCULAR | Status: AC
  Administered 2020-09-25: 2 mg via INTRAVENOUS
  Filled 2020-09-25: qty 2

## 2020-09-25 MED ORDER — CEFAZOLIN SODIUM-DEXTROSE 1-4 GM/50ML-% IV SOLN
1.0000 g | Freq: Four times a day (QID) | INTRAVENOUS | Status: AC
  Administered 2020-09-25 – 2020-09-26 (×2): 1 g via INTRAVENOUS
  Filled 2020-09-25 (×2): qty 50

## 2020-09-25 MED ORDER — ONDANSETRON HCL 4 MG PO TABS: 4.0000 mg | ORAL_TABLET | Freq: Four times a day (QID) | ORAL | Status: DC | PRN

## 2020-09-25 MED ORDER — APIXABAN 2.5 MG PO TABS
2.5000 mg | ORAL_TABLET | Freq: Two times a day (BID) | ORAL | Status: DC
  Administered 2020-09-26 – 2020-09-27 (×3): 2.5 mg via ORAL
  Filled 2020-09-25 (×3): qty 1

## 2020-09-25 MED ORDER — PROPOFOL 1000 MG/100ML IV EMUL
INTRAVENOUS | Status: AC
  Filled 2020-09-25: qty 100

## 2020-09-25 MED ORDER — PROPOFOL 500 MG/50ML IV EMUL
INTRAVENOUS | Status: DC | PRN
  Administered 2020-09-25: 85 ug/kg/min via INTRAVENOUS

## 2020-09-25 MED ORDER — PRAMIPEXOLE DIHYDROCHLORIDE 0.25 MG PO TABS
0.2500 mg | ORAL_TABLET | Freq: Every day | ORAL | Status: DC
  Administered 2020-09-26 – 2020-09-27 (×2): 0.25 mg via ORAL
  Filled 2020-09-25 (×2): qty 1

## 2020-09-25 MED ORDER — ADULT MULTIVITAMIN W/MINERALS CH
1.0000 | ORAL_TABLET | Freq: Every day | ORAL | Status: DC
  Administered 2020-09-26 – 2020-09-27 (×2): 1 via ORAL
  Filled 2020-09-25 (×2): qty 1

## 2020-09-25 MED ORDER — ORAL CARE MOUTH RINSE: 15.0000 mL | Freq: Once | OROMUCOSAL | Status: AC

## 2020-09-25 MED ORDER — DEXAMETHASONE SODIUM PHOSPHATE 10 MG/ML IJ SOLN
INTRAMUSCULAR | Status: DC | PRN
  Administered 2020-09-25: 5 mg

## 2020-09-25 MED ORDER — PRAMIPEXOLE DIHYDROCHLORIDE 0.25 MG PO TABS
0.1250 mg | ORAL_TABLET | Freq: Every day | ORAL | Status: DC
  Administered 2020-09-26: 0.125 mg via ORAL
  Filled 2020-09-25: qty 0.5

## 2020-09-25 MED ORDER — ACETAMINOPHEN 325 MG PO TABS
325.0000 mg | ORAL_TABLET | Freq: Four times a day (QID) | ORAL | Status: DC | PRN
Start: 2020-09-26 — End: 2020-09-27
  Administered 2020-09-26: 650 mg via ORAL
  Filled 2020-09-25: qty 2

## 2020-09-25 MED ORDER — ONDANSETRON HCL 4 MG/2ML IJ SOLN: 4.0000 mg | Freq: Four times a day (QID) | INTRAMUSCULAR | Status: DC | PRN

## 2020-09-25 MED ORDER — ACETAMINOPHEN 10 MG/ML IV SOLN: 1000.0000 mg | Freq: Once | INTRAVENOUS | Status: DC | PRN

## 2020-09-25 SURGICAL SUPPLY — 55 items
BAG COUNTER SPONGE SURGICOUNT (BAG) ×2 IMPLANT
BAG ZIPLOCK 12X15 (MISCELLANEOUS) IMPLANT
BENZOIN TINCTURE PRP APPL 2/3 (GAUZE/BANDAGES/DRESSINGS) IMPLANT
BLADE SAG 18X100X1.27 (BLADE) ×2 IMPLANT
BLADE SAW SGTL 11.0X1.19X90.0M (BLADE) ×2 IMPLANT
BLADE SAW SGTL 81X20 HD (BLADE) ×2 IMPLANT
BLADE SURG SZ10 CARB STEEL (BLADE) ×4 IMPLANT
BNDG ELASTIC 6X5.8 VLCR STR LF (GAUZE/BANDAGES/DRESSINGS) ×2 IMPLANT
CLOTH BEACON ORANGE TIMEOUT ST (SAFETY) IMPLANT
COVER SURGICAL LIGHT HANDLE (MISCELLANEOUS) ×2 IMPLANT
CUFF TOURN SGL QUICK 34 (TOURNIQUET CUFF) ×2
CUFF TRNQT CYL 34X4.125X (TOURNIQUET CUFF) ×1 IMPLANT
DECANTER SPIKE VIAL GLASS SM (MISCELLANEOUS) IMPLANT
DRAPE INCISE IOBAN 66X45 STRL (DRAPES) ×2 IMPLANT
DRAPE U-SHAPE 47X51 STRL (DRAPES) ×2 IMPLANT
DRSG PAD ABDOMINAL 8X10 ST (GAUZE/BANDAGES/DRESSINGS) ×4 IMPLANT
DURAPREP 26ML APPLICATOR (WOUND CARE) ×2 IMPLANT
ELECT REM PT RETURN 15FT ADLT (MISCELLANEOUS) ×2 IMPLANT
EVACUATOR 1/8 PVC DRAIN (DRAIN) IMPLANT
GAUZE SPONGE 4X4 12PLY STRL (GAUZE/BANDAGES/DRESSINGS) ×2 IMPLANT
GAUZE XEROFORM 5X9 LF (GAUZE/BANDAGES/DRESSINGS) ×2 IMPLANT
GLOVE SRG 8 PF TXTR STRL LF DI (GLOVE) ×2 IMPLANT
GLOVE SURG ENC MOIS LTX SZ7.5 (GLOVE) ×2 IMPLANT
GLOVE SURG NEOPR MICRO LF SZ8 (GLOVE) ×2 IMPLANT
GLOVE SURG UNDER POLY LF SZ8 (GLOVE) ×4
GOWN STRL REUS W/TWL XL LVL3 (GOWN DISPOSABLE) ×4 IMPLANT
HANDPIECE INTERPULSE COAX TIP (DISPOSABLE) ×2
HOLDER FOLEY CATH W/STRAP (MISCELLANEOUS) ×2 IMPLANT
IMMOBILIZER KNEE 20 (SOFTGOODS) ×2
IMMOBILIZER KNEE 20 THIGH 36 (SOFTGOODS) ×1 IMPLANT
INSERT TIB LCS RP STD 12.5 (Knees) ×2 IMPLANT
KIT TURNOVER KIT A (KITS) ×2 IMPLANT
NDL SAFETY ECLIPSE 18X1.5 (NEEDLE) IMPLANT
NEEDLE HYPO 18GX1.5 SHARP (NEEDLE)
PACK TOTAL KNEE CUSTOM (KITS) ×2 IMPLANT
PADDING CAST COTTON 6X4 STRL (CAST SUPPLIES) ×4 IMPLANT
PROTECTOR NERVE ULNAR (MISCELLANEOUS) ×2 IMPLANT
SET HNDPC FAN SPRY TIP SCT (DISPOSABLE) ×1 IMPLANT
SET PAD KNEE POSITIONER (MISCELLANEOUS) ×2 IMPLANT
SPONGE T-LAP 18X18 ~~LOC~~+RFID (SPONGE) ×6 IMPLANT
STAPLER VISISTAT 35W (STAPLE) IMPLANT
STRIP CLOSURE SKIN 1/2X4 (GAUZE/BANDAGES/DRESSINGS) ×4 IMPLANT
SUT MNCRL AB 4-0 PS2 18 (SUTURE) IMPLANT
SUT VIC AB 0 CT1 36 (SUTURE) ×2 IMPLANT
SUT VIC AB 1 CT1 36 (SUTURE) ×4 IMPLANT
SUT VIC AB 2-0 CT1 27 (SUTURE) ×4
SUT VIC AB 2-0 CT1 TAPERPNT 27 (SUTURE) ×2 IMPLANT
SWAB COLLECTION DEVICE MRSA (MISCELLANEOUS) IMPLANT
SWAB CULTURE ESWAB REG 1ML (MISCELLANEOUS) IMPLANT
SYR 3ML LL SCALE MARK (SYRINGE) IMPLANT
TOWER CARTRIDGE SMART MIX (DISPOSABLE) IMPLANT
TRAY FOLEY MTR SLVR 14FR STAT (SET/KITS/TRAYS/PACK) ×2 IMPLANT
TUBE KAMVAC SUCTION (TUBING) IMPLANT
WATER STERILE IRR 1000ML POUR (IV SOLUTION) IMPLANT
WRAP KNEE MAXI GEL POST OP (GAUZE/BANDAGES/DRESSINGS) ×2 IMPLANT

## 2020-09-25 NOTE — Anesthesia Procedure Notes (Signed)
Spinal  Patient location during procedure: OR Start time: 09/25/2020 12:59 PM End time: 09/25/2020 1:01 PM Staffing Performed: anesthesiologist  Anesthesiologist: Darral Dash, DO Preanesthetic Checklist Completed: patient identified, IV checked, site marked, risks and benefits discussed, surgical consent, monitors and equipment checked, pre-op evaluation and timeout performed Spinal Block Patient position: sitting Prep: DuraPrep Patient monitoring: heart rate, cardiac monitor, continuous pulse ox and blood pressure Approach: midline Location: L3-4 Injection technique: single-shot Needle Needle type: Pencan  Needle gauge: 24 G Needle length: 10 cm Assessment Events: CSF return Additional Notes Patient identified. Risks/Benefits/Options discussed with patient including but not limited to bleeding, infection, nerve damage, paralysis, failed block, incomplete pain control, headache, blood pressure changes, nausea, vomiting, reactions to medications, itching and postpartum back pain. Confirmed with bedside nurse the patient's most recent platelet count. Confirmed with patient that they are not currently taking any anticoagulation, have any bleeding history or any family history of bleeding disorders. Patient expressed understanding and wished to proceed. All questions were answered. Sterile technique was used throughout the entire procedure. Please see nursing notes for vital signs. Warning signs of high block given to the patient including shortness of breath, tingling/numbness in hands, complete motor block, or any concerning symptoms with instructions to call for help. Patient was given instructions on fall risk and not to get out of bed. All questions and concerns addressed with instructions to call with any issues or inadequate analgesia.

## 2020-09-25 NOTE — Interval H&P Note (Signed)
History and Physical Interval Note: The patient understands that she is here today for a right knee revision arthroplasty.  She has had significant pain and synovitis in that knee that was replaced several years ago.  There is potential for just a polyethylene exchange and upsizing.  She used to have a BMI of over 47 now she is down to 40.  The components appear stable on plain films but she is certainly having lateral pain.  She understands why we are proceeding with surgery today and there is a risk of having to replace all components and revise these depending on her intraoperative findings.  There is been no acute or interval change in her medical status.  Please see recent H&P.  The risks and benefits of surgery been explained in detail and informed consent was obtained.  The right knee has been marked.  09/25/2020 11:49 AM  Marc Morgans  has presented today for surgery, with the diagnosis of failed right total knee arthroplasty.  The various methods of treatment have been discussed with the patient and family. After consideration of risks, benefits and other options for treatment, the patient has consented to  Procedure(s): RIGHT TOTAL KNEE REVISION (Right) as a surgical intervention.  The patient's history has been reviewed, patient examined, no change in status, stable for surgery.  I have reviewed the patient's chart and labs.  Questions were answered to the patient's satisfaction.     Mcarthur Rossetti

## 2020-09-25 NOTE — Brief Op Note (Signed)
09/25/2020  2:19 PM  PATIENT:  [name]  65 y.o. female  PRE-OPERATIVE DIAGNOSIS:  poly-liner wear with synovitis right total knee  POST-OPERATIVE DIAGNOSIS:  same  PROCEDURE:  Procedure(s): RIGHT POLY LINER EXCHANGE (Right)  SURGEON:  Surgeon(s) and Role:    Mcarthur Rossetti, MD - Primary  PHYSICIAN ASSISTANT: Benita Stabile, PA-C  ANESTHESIA:   regional and spinal  EBL:  25 mL   COUNTS:  YES  TOURNIQUET:   Total Tourniquet Time Documented: Thigh (Right) - 30 minutes Total: Thigh (Right) - 30 minutes   DICTATION: .Other Dictation: Dictation Number [id number]  PLAN OF CARE: Admit for overnight observation  PATIENT DISPOSITION:  PACU - hemodynamically stable.   Delay start of Pharmacological VTE agent (>24hrs) due to surgical blood loss or risk of bleeding: no

## 2020-09-25 NOTE — Anesthesia Procedure Notes (Signed)
Anesthesia Regional Block: Adductor canal block   Pre-Anesthetic Checklist: , timeout performed,  Correct Patient, Correct Site, Correct Laterality,  Correct Procedure, Correct Position, site marked,  Risks and benefits discussed,  Surgical consent,  Pre-op evaluation,  At surgeon's request and post-op pain management  Laterality: Right  Prep: Dura Prep       Needles:  Injection technique: Single-shot  Needle Type: Echogenic Stimulator Needle     Needle Length: 10cm  Needle Gauge: 20     Additional Needles:   Procedures:,,,, ultrasound used (permanent image in chart),,    Narrative:  Start time: 09/25/2020 12:06 PM End time: 09/25/2020 12:10 PM Injection made incrementally with aspirations every 5 mL.  Performed by: Personally  Anesthesiologist: Darral Dash, DO  Additional Notes: Patient identified. Risks/Benefits/Options discussed with patient including but not limited to bleeding, infection, nerve damage, failed block, incomplete pain control. Patient expressed understanding and wished to proceed. All questions were answered. Sterile technique was used throughout the entire procedure. Please see nursing notes for vital signs. Aspirated in 5cc intervals with injection for negative confirmation. Patient was given instructions on fall risk and not to get out of bed. All questions and concerns addressed with instructions to call with any issues or inadequate analgesia.

## 2020-09-25 NOTE — Transfer of Care (Signed)
Immediate Anesthesia Transfer of Care Note  Patient: [name]  Procedure(s) Performed: Procedure(s): RIGHT POLY LINER EXCHANGE (Right)  Patient Location: PACU  Anesthesia Type:Spinal  Level of Consciousness:  sedated, patient cooperative and responds to stimulation  Airway & Oxygen Therapy:Patient Spontanous Breathing and Patient connected to face mask oxgen  Post-op Assessment:  Report given to PACU RN and Post -op Vital signs reviewed and stable  Post vital signs:  Reviewed and stable  Last Vitals:  Vitals:   09/25/20 1210 09/25/20 1215  BP: (!) 116/56 114/87  Pulse: 74 74  Resp: 17 20  Temp:    SpO2: 47% 15%    Complications: No apparent anesthesia complications

## 2020-09-25 NOTE — Anesthesia Postprocedure Evaluation (Signed)
Anesthesia Post Note  Patient: [name]  Procedure(s) Performed: RIGHT POLY LINER EXCHANGE (Right: Knee)     Patient location during evaluation: Nursing Unit Anesthesia Type: Regional and Spinal Level of consciousness: oriented and awake and alert Pain management: pain level controlled Vital Signs Assessment: post-procedure vital signs reviewed and stable Respiratory status: spontaneous breathing and respiratory function stable Cardiovascular status: blood pressure returned to baseline and stable Postop Assessment: no headache, no backache, no apparent nausea or vomiting and patient able to bend at knees Anesthetic complications: no   No notable events documented.  Last Vitals:  Vitals:   09/25/20 1545 09/25/20 1557  BP: 130/67 (!) 141/75  Pulse: 75 73  Resp: 14   Temp:  36.9 C  SpO2: 97% 97%    Last Pain:  Vitals:   09/25/20 1557  TempSrc: Oral  PainSc:                  Barnet Glasgow

## 2020-09-25 NOTE — Plan of Care (Signed)
  Problem: Pain Management: Goal: Pain level will decrease with appropriate interventions Outcome: Progressing   

## 2020-09-25 NOTE — Progress Notes (Signed)
Time out completed AssistedDr. Jana Half with right, ultrasound guided, adductor canal block. Side rails up, monitors on throughout procedure. See vital signs in flow sheet. Tolerated Procedure well.

## 2020-09-25 NOTE — Op Note (Signed)
[name], Rich MEDICAL RECORD NO: [id number] ACCOUNT NO: [id number] DATE OF BIRTH: 1955/11/13 FACILITY: [facility]ATION: [id number] PHYSICIAN: Lind Guest. Ninfa Linden, MD  Operative Report   DATE OF PROCEDURE: 09/25/2020  PREOPERATIVE DIAGNOSIS:  Right knee polyethylene liner wear with questionable failure or loosening of prosthesis.  POSTOPERATIVE DIAGNOSIS:  Right knee polyethylene wear with synovitis and no evidence of metal component loosening.  PROCEDURE PERFORMED:  Right knee arthroscopy with partial synovectomy, debridement, and poly liner exchange.  HARDWARE: 1.  Explanted 10.5 mm standard rotating platform polyethylene liner. 2.  Implanted standard 12.5 mm RP polyethylene liner.  SURGEON:  Lind Guest. Ninfa Linden, MD  ASSISTANT:  Benita Stabile, PA-C  ANESTHESIA:: 1.  Spinal. 2.  Adductor canal block to the right lower extremity.  TOURNIQUET TIME:  Less than 1 hour.  BLOOD LOSS:  Less than 100 mL.  ANTIBIOTICS:  2 g IV Ancef.  COMPLICATIONS:  None.  INDICATIONS:  The patient is a 65 year old female with right knee pain, status post a right total knee arthroplasty that was done about 7 years ago.  At one point, her BMI was 47, and she has lost a little weight and is down to 40.  A partner of mine  performed her total knee arthroplasty in 2015 and actually on the tibial side used a MTB revision tibial component with a long stem due to her obesity.  The insert for the RP insert from the polyethylene side of things was 10 mm thickness.  Over time,  she has developed some pain in that knee, mainly on the lateral aspect.  Workup for infection was negative, and her plain films showed a well-seated implant, but there was definite narrowing of the joint line.  I felt that this was most likely  representing synovitis and polyethylene liner wear.  A bone scan suggested loosening of the prosthesis, but plain films did not correlate with that.  At this point, I recommended an  arthrotomy of the knee with debridement of any inflamed synovial tissue  and removing the poly liner and upsizing it as necessary and revising all components as necessary depending on our intraoperative findings.  Risks and benefits of surgery were explained to her in detail including the risk of infection, acute blood loss  anemia, nerve injury, implant failure, and need for further surgery.  There is also risk of DVT.  She understands these risks and does wish to proceed given the continued pain of her knee.  Of note, on my exam, she does have some slight symptoms of  instability of that knee as well.  DESCRIPTION OF PROCEDURE:  After informed consent was obtained, appropriate right knee was marked.  Anesthesia obtained an adductor canal block of the right lower extremity in the holding room.  She was then brought to the operating room and sat up on  the operating table.  Spinal anesthesia was obtained.  She was laid in supine position.  A Foley catheter was placed, and a nonsterile tourniquet was placed around her upper right thigh.  Her right thigh, knee, leg, and ankle were prepped and draped with  DuraPrep and sterile drapes.  A timeout was called.  She was identified as correct patient, correct right knee.  We then used Esmarch to wrap that leg and tourniquet was inflated to 300 mm pressure.  We then made a direct midline incision over the knee  and carried this proximally and distally through her previous incision.  We dissected down the knee joint, carried out  a medial parapatellar arthrotomy.  Of note, she has a previous history of patellectomy, so there is no patella.  We found only a mild  effusion of the knee and some synovitis that we removed from the medial and lateral gutters and the superior aspect of the knee.  We then flexed the knee, and we were able to remove the previous 10 mm thickness polyethylene liner, which was a standard  liner.  It did show significant polyethylene liner wear,  medial and lateral.  We then removed synovitis from the posterior component of the knee as well.  Next, we irrigated the knee with 2 liters of normal saline solution using pulsatile lavage.  We  then assessed the femoral component and tibial component, and we did not see any evidence of loosening or motion of these components at all.  At that point, we decided to trial to a 12.5 mm poly with upsizing her.  We placed the trial poly liner, and we  were pleased with range of motion and stability of that, which I feel like she needs with upsizing of the poly.  We then opened up the 12.5 mm standard polyethylene rotating platform poly and placed this within the knee, and we were pleased with range of  motion and stability.  We then irrigated the knee again with normal saline solution and dried the knee real well.  We let the tourniquet down.  Hemostasis was obtained with electrocautery.  We then closed the arthrotomy with interrupted #1 Vicryl suture  followed by 0 Vicryl to close deep tissue and 2-0 Vicryl to close subcutaneous tissue.  The skin was reapproximated with staples.  A well-padded dressing was applied.  She was taken to recovery room in stable condition with all final counts being  correct and no complications noted.   Of note, Benita Stabile, PA-C, did assist during the entire case, and assistance was crucial for facilitating every aspect of this case.   ROH D: 09/25/2020 2:17:44 pm T: 09/25/2020 10:40:00 pm  JOB: [id number]/ [id number]

## 2020-09-26 ENCOUNTER — Other Ambulatory Visit (HOSPITAL_COMMUNITY): Payer: Self-pay

## 2020-09-26 LAB — CBC
HCT: 35.3 % — ABNORMAL LOW (ref 36.0–46.0)
Hemoglobin: 11.8 g/dL — ABNORMAL LOW (ref 12.0–15.0)
MCH: 33.3 pg (ref 26.0–34.0)
MCHC: 33.4 g/dL (ref 30.0–36.0)
MCV: 99.7 fL (ref 80.0–100.0)
Platelets: 263 10*3/uL (ref 150–400)
RBC: 3.54 MIL/uL — ABNORMAL LOW (ref 3.87–5.11)
RDW: 13.4 % (ref 11.5–15.5)
WBC: 10 10*3/uL (ref 4.0–10.5)
nRBC: 0 % (ref 0.0–0.2)

## 2020-09-26 MED ORDER — APIXABAN 2.5 MG PO TABS: 2.5000 mg | ORAL_TABLET | Freq: Two times a day (BID) | ORAL | 0 refills | Status: DC

## 2020-09-26 MED ORDER — TIZANIDINE HCL 4 MG PO TABS: 2.0000 mg | ORAL_TABLET | Freq: Three times a day (TID) | ORAL | 0 refills | Status: DC | PRN

## 2020-09-26 MED ORDER — APIXABAN 2.5 MG PO TABS
2.5000 mg | ORAL_TABLET | Freq: Two times a day (BID) | ORAL | 0 refills | Status: DC
  Filled 2020-09-26: qty 60, 30d supply, fill #0

## 2020-09-26 MED ORDER — HYDROMORPHONE HCL 2 MG PO TABS
2.0000 mg | ORAL_TABLET | ORAL | Status: DC | PRN
  Administered 2020-09-26 – 2020-09-27 (×3): 2 mg via ORAL
  Filled 2020-09-26 (×4): qty 1

## 2020-09-26 MED ORDER — HYDROMORPHONE HCL 2 MG PO TABS: 2.0000 mg | ORAL_TABLET | ORAL | 0 refills | Status: DC | PRN

## 2020-09-26 MED ORDER — HYDROMORPHONE HCL 2 MG PO TABS
2.0000 mg | ORAL_TABLET | ORAL | 0 refills | Status: DC | PRN
  Filled 2020-09-26 (×2): qty 30, 5d supply, fill #0

## 2020-09-26 NOTE — Discharge Instructions (Addendum)
INSTRUCTIONS AFTER JOINT REPLACEMENT   Remove items at home which could result in a fall. This includes throw rugs or furniture in walking pathways ICE to the affected joint every three hours while awake for 30 minutes at a time, for at least the first 3-5 days, and then as needed for pain and swelling.  Continue to use ice for pain and swelling. You may notice swelling that will progress down to the foot and ankle.  This is normal after surgery.  Elevate your leg when you are not up walking on it.   Continue to use the breathing machine you got in the hospital (incentive spirometer) which will help keep your temperature down.  It is common for your temperature to cycle up and down following surgery, especially at night when you are not up moving around and exerting yourself.  The breathing machine keeps your lungs expanded and your temperature down.   DIET:  As you were doing prior to hospitalization, we recommend a well-balanced diet.  DRESSING / WOUND CARE / SHOWERING  Keep the surgical dressing until follow up.  The dressing is water proof, so you can shower without any extra covering.  IF THE DRESSING FALLS OFF or the wound gets wet inside, change the dressing with sterile gauze.  Please use good hand washing techniques before changing the dressing.  Do not use any lotions or creams on the incision until instructed by your surgeon.    ACTIVITY  Increase activity slowly as tolerated, but follow the weight bearing instructions below.   No driving for 6 weeks or until further direction given by your physician.  You cannot drive while taking narcotics.  No lifting or carrying greater than 10 lbs. until further directed by your surgeon. Avoid periods of inactivity such as sitting longer than an hour when not asleep. This helps prevent blood clots.  You may return to work once you are authorized by your doctor.     WEIGHT BEARING   Weight bearing as tolerated with assist device (walker, cane,  etc) as directed, use it as long as suggested by your surgeon or therapist, typically at least 4-6 weeks.   EXERCISES  Results after joint replacement surgery are often greatly improved when you follow the exercise, range of motion and muscle strengthening exercises prescribed by your doctor. Safety measures are also important to protect the joint from further injury. Any time any of these exercises cause you to have increased pain or swelling, decrease what you are doing until you are comfortable again and then slowly increase them. If you have problems or questions, call your caregiver or physical therapist for advice.   Rehabilitation is important following a joint replacement. After just a few days of immobilization, the muscles of the leg can become weakened and shrink (atrophy).  These exercises are designed to build up the tone and strength of the thigh and leg muscles and to improve motion. Often times heat used for twenty to thirty minutes before working out will loosen up your tissues and help with improving the range of motion but do not use heat for the first two weeks following surgery (sometimes heat can increase post-operative swelling).   These exercises can be done on a training (exercise) mat, on the floor, on a table or on a bed. Use whatever works the best and is most comfortable for you.    Use music or television while you are exercising so that the exercises are a pleasant break in your   day. This will make your life better with the exercises acting as a break in your routine that you can look forward to.   Perform all exercises about fifteen times, three times per day or as directed.  You should exercise both the operative leg and the other leg as well.  Exercises include:   Quad Sets - Tighten up the muscle on the front of the thigh (Quad) and hold for 5-10 seconds.   Straight Leg Raises - With your knee straight (if you were given a brace, keep it on), lift the leg to 60  degrees, hold for 3 seconds, and slowly lower the leg.  Perform this exercise against resistance later as your leg gets stronger.  Leg Slides: Lying on your back, slowly slide your foot toward your buttocks, bending your knee up off the floor (only go as far as is comfortable). Then slowly slide your foot back down until your leg is flat on the floor again.  Angel Wings: Lying on your back spread your legs to the side as far apart as you can without causing discomfort.  Hamstring Strength:  Lying on your back, push your heel against the floor with your leg straight by tightening up the muscles of your buttocks.  Repeat, but this time bend your knee to a comfortable angle, and push your heel against the floor.  You may put a pillow under the heel to make it more comfortable if necessary.   A rehabilitation program following joint replacement surgery can speed recovery and prevent re-injury in the future due to weakened muscles. Contact your doctor or a physical therapist for more information on knee rehabilitation.    CONSTIPATION  Constipation is defined medically as fewer than three stools per week and severe constipation as less than one stool per week.  Even if you have a regular bowel pattern at home, your normal regimen is likely to be disrupted due to multiple reasons following surgery.  Combination of anesthesia, postoperative narcotics, change in appetite and fluid intake all can affect your bowels.   YOU MUST use at least one of the following options; they are listed in order of increasing strength to get the job done.  They are all available over the counter, and you may need to use some, POSSIBLY even all of these options:    Drink plenty of fluids (prune juice may be helpful) and high fiber foods Colace 100 mg by mouth twice a day  Senokot for constipation as directed and as needed Dulcolax (bisacodyl), take with full glass of water  Miralax (polyethylene glycol) once or twice a day as  needed.  If you have tried all these things and are unable to have a bowel movement in the first 3-4 days after surgery call either your surgeon or your primary doctor.    If you experience loose stools or diarrhea, hold the medications until you stool forms back up.  If your symptoms do not get better within 1 week or if they get worse, check with your doctor.  If you experience "the worst abdominal pain ever" or develop nausea or vomiting, please contact the office immediately for further recommendations for treatment.   ITCHING:  If you experience itching with your medications, try taking only a single pain pill, or even half a pain pill at a time.  You can also use Benadryl over the counter for itching or also to help with sleep.   TED HOSE STOCKINGS:  Use stockings on both   legs until for at least 2 weeks or as directed by physician office. They may be removed at night for sleeping.  MEDICATIONS:  See your medication summary on the "After Visit Summary" that nursing will review with you.  You may have some home medications which will be placed on hold until you complete the course of blood thinner medication.  It is important for you to complete the blood thinner medication as prescribed.  PRECAUTIONS:  If you experience chest pain or shortness of breath - call 911 immediately for transfer to the hospital emergency department.   If you develop a fever greater that 101 F, purulent drainage from wound, increased redness or drainage from wound, foul odor from the wound/dressing, or calf pain - CONTACT YOUR SURGEON.                                                   FOLLOW-UP APPOINTMENTS:  If you do not already have a post-op appointment, please call the office for an appointment to be seen by your surgeon.  Guidelines for how soon to be seen are listed in your "After Visit Summary", but are typically between 1-4 weeks after surgery.  OTHER INSTRUCTIONS:   Knee Replacement:  Do not place pillow  under knee, focus on keeping the knee straight while resting. CPM instructions: 0-90 degrees, 2 hours in the morning, 2 hours in the afternoon, and 2 hours in the evening. Place foam block, curve side up under heel at all times except when in CPM or when walking.  DO NOT modify, tear, cut, or change the foam block in any way.  POST-OPERATIVE OPIOID TAPER INSTRUCTIONS: It is important to wean off of your opioid medication as soon as possible. If you do not need pain medication after your surgery it is ok to stop day one. Opioids include: Codeine, Hydrocodone(Norco, Vicodin), Oxycodone(Percocet, oxycontin) and hydromorphone amongst others.  Long term and even short term use of opiods can cause: Increased pain response Dependence Constipation Depression Respiratory depression And more.  Withdrawal symptoms can include Flu like symptoms Nausea, vomiting And more Techniques to manage these symptoms Hydrate well Eat regular healthy meals Stay active Use relaxation techniques(deep breathing, meditating, yoga) Do Not substitute Alcohol to help with tapering If you have been on opioids for less than two weeks and do not have pain than it is ok to stop all together.  Plan to wean off of opioids This plan should start within one week post op of your joint replacement. Maintain the same interval or time between taking each dose and first decrease the dose.  Cut the total daily intake of opioids by one tablet each day Next start to increase the time between doses. The last dose that should be eliminated is the evening dose.   MAKE SURE YOU:  Understand these instructions.  Get help right away if you are not doing well or get worse.    Thank you for letting us be a part of your medical care team.  It is a privilege we respect greatly.  We hope these instructions will help you stay on track for a fast and full recovery!   Information on my medicine - ELIQUIS (apixaban)   Why was Eliquis  prescribed for you? Eliquis was prescribed for you to reduce the risk of blood clots forming after orthopedic   surgery.    What do You need to know about Eliquis? Take your Eliquis TWICE DAILY - one tablet in the morning and one tablet in the evening with or without food.  It would be best to take the dose about the same time each day.  If you have difficulty swallowing the tablet whole please discuss with your pharmacist how to take the medication safely.  Take Eliquis exactly as prescribed by your doctor and DO NOT stop taking Eliquis without talking to the doctor who prescribed the medication.  Stopping without other medication to take the place of Eliquis may increase your risk of developing a clot.  After discharge, you should have regular check-up appointments with your healthcare provider that is prescribing your Eliquis.  What do you do if you miss a dose? If a dose of ELIQUIS is not taken at the scheduled time, take it as soon as possible on the same day and twice-daily administration should be resumed.  The dose should not be doubled to make up for a missed dose.  Do not take more than one tablet of ELIQUIS at the same time.  Important Safety Information A possible side effect of Eliquis is bleeding. You should call your healthcare provider right away if you experience any of the following: Bleeding from an injury or your nose that does not stop. Unusual colored urine (red or dark brown) or unusual colored stools (red or black). Unusual bruising for unknown reasons. A serious fall or if you hit your head (even if there is no bleeding).  Some medicines may interact with Eliquis and might increase your risk of bleeding or clotting while on Eliquis. To help avoid this, consult your healthcare provider or pharmacist prior to using any new prescription or non-prescription medications, including herbals, vitamins, non-steroidal anti-inflammatory drugs (NSAIDs) and  supplements.  This website has more information on Eliquis (apixaban): http://[email or web address].com/eliquis/home    

## 2020-09-26 NOTE — Progress Notes (Signed)
Physical Therapy Treatment Patient Details Name: [name] MRN: [id number] DOB: [birth year] Today's Date: 09/26/2020   History of Present Illness 65 yo female s/p R TK revision-partial synevectomy, poly exchange 09/25/20. Hx of R TKA 2015, neuropathy, obesity, L4-S1 PLIF 2020    PT Comments    Pt continues to participate/progress well. Moderate pain with activity. Verbally reviewed how to ascend/descend a small threshold step ("up with good 1st, down with bad 1st"). Okay to d/c from PT standpoint     Recommendations for follow up therapy are one component of a multi-disciplinary discharge planning process, led by the attending physician.  Recommendations may be updated based on patient status, additional functional criteria and insurance authorization.  Follow Up Recommendations  Follow surgeon's recommendation for DC plan and follow-up therapies     Equipment Recommendations  None recommended by PT    Recommendations for Other Services       Precautions / Restrictions Precautions Precautions: Fall;Knee Restrictions Weight Bearing Restrictions: No Other Position/Activity Restrictions: WBAT     Mobility  Bed Mobility Overal bed mobility: Needs Assistance Bed Mobility: Sit to Supine;Supine to Sit     Supine to sit: Min guard Sit to supine: Min assist   General bed mobility comments: Assist for R LE.    Transfers Overall transfer level: Needs assistance Equipment used: Rolling walker (2 wheeled) Transfers: Sit to/from Stand Sit to Stand: Min guard         General transfer comment: Min guard for safety. Cues for safety, hand placement. Increased time.  Ambulation/Gait Ambulation/Gait assistance: Min guard Gait Distance (Feet): 115 Feet Assistive device: Rolling walker (2 wheeled) Gait Pattern/deviations: Step-through pattern;Decreased stride length     General Gait Details: Min guard for safety. Pt denied dizziness.   Stairs              Wheelchair Mobility    Modified Rankin (Stroke Patients Only)       Balance Overall balance assessment: Mild deficits observed, not formally tested                                          Cognition Arousal/Alertness: Awake/alert Behavior During Therapy: WFL for tasks assessed/performed Overall Cognitive Status: Within Functional Limits for tasks assessed                                        Exercises Total Joint Exercises Ankle Circles/Pumps: AROM;Both;10 reps Quad Sets: AROM;Right;5 reps Heel Slides: AAROM;Right;5 reps Hip ABduction/ADduction: AAROM;Right;5 reps Straight Leg Raises: AAROM;Right;5 reps Goniometric ROM: ~10-90    General Comments        Pertinent Vitals/Pain Pain Assessment: 0-10 Pain Score: 6  Pain Location: R knee Pain Descriptors / Indicators: Discomfort;Sore;Aching Pain Intervention(s): Limited activity within patient's tolerance;Ice applied;Repositioned    Home Living Family/patient expects to be discharged to:: Private residence Living Arrangements: Spouse/significant other Available Help at Discharge: Family Type of Home: House Home Access:  (1 small threshold step)   Home Layout: One level Home Equipment: [place] - 2 wheels;Bedside commode      Prior Function Level of Independence: Independent          PT Goals (current goals can now be found in the care plan section) Acute Rehab PT Goals Patient Stated Goal: home today PT Goal Formulation: With  patient/family Time For Goal Achievement: 10/10/20 Potential to Achieve Goals: Good Progress towards PT goals: Progressing toward goals    Frequency    7X/week      PT Plan Current plan remains appropriate    Co-evaluation              AM-PAC PT "6 Clicks" Mobility   Outcome Measure  Help needed turning from your back to your side while in a flat bed without using bedrails?: A Little Help needed moving from lying on your back to  sitting on the side of a flat bed without using bedrails?: A Little Help needed moving to and from a bed to a chair (including a wheelchair)?: A Little Help needed standing up from a chair using your arms (e.g., wheelchair or bedside chair)?: A Little Help needed to walk in hospital room?: A Little Help needed climbing 3-5 steps with a railing? : A Little 6 Click Score: 18    End of Session Equipment Utilized During Treatment: Gait belt Activity Tolerance: Patient tolerated treatment well Patient left: in bed;with call bell/phone within reach;with family/visitor present   PT Visit Diagnosis: Pain;Other abnormalities of gait and mobility (R26.89) Pain - Right/Left: Right Pain - part of body: Knee     Time: 1062-6948 PT Time Calculation (min) (ACUTE ONLY): 13 min  Charges:  $Gait Training: 8-22 mins                         Doreatha Massed, PT Acute Rehabilitation  Office: [phone] Pager: [phone]

## 2020-09-26 NOTE — Evaluation (Signed)
Physical Therapy Evaluation Patient Details Name: [name] MRN: [id number] DOB: [birth year] Today's Date: 09/26/2020  History of Present Illness  65 yo female s/p R TK revision-partial synevectomy, poly exchange 09/25/20. Hx of R TKA 2015, neuropathy, obesity, L4-S1 PLIF 2020  Clinical Impression  On eval, pt was Min A for mobility. She walked ~60 feet with a RW. Moderate pain with activity. Anticipate pt will progress well. Will plan to have a 2nd session prior to possible d/c home later today.        Recommendations for follow up therapy are one component of a multi-disciplinary discharge planning process, led by the attending physician.  Recommendations may be updated based on patient status, additional functional criteria and insurance authorization.  Follow Up Recommendations Follow surgeon's recommendation for DC plan and follow-up therapies    Equipment Recommendations  None recommended by PT    Recommendations for Other Services       Precautions / Restrictions Precautions Precautions: Fall;Knee Restrictions Weight Bearing Restrictions: No Other Position/Activity Restrictions: WBAT      Mobility  Bed Mobility               General bed mobility comments: oob in recliner    Transfers Overall transfer level: Needs assistance Equipment used: Rolling walker (2 wheeled) Transfers: Sit to/from Stand Sit to Stand: Min guard         General transfer comment: Min guard for safety. Cues for safety, hand placement. Increased time.  Ambulation/Gait Ambulation/Gait assistance: Min guard Gait Distance (Feet): 60 Feet Assistive device: Rolling walker (2 wheeled) Gait Pattern/deviations: Step-to pattern;Step-through pattern;Decreased stride length     General Gait Details: Min guard for safety. Pt denied dizziness.  Stairs            Wheelchair Mobility    Modified Rankin (Stroke Patients Only)       Balance Overall balance assessment:  Mild deficits observed, not formally tested                                           Pertinent Vitals/Pain Pain Assessment: 0-10 Pain Score: 5  Pain Location: R knee Pain Descriptors / Indicators: Discomfort;Sore;Aching Pain Intervention(s): Limited activity within patient's tolerance;Monitored during session;Ice applied;Premedicated before session    Home Living Family/patient expects to be discharged to:: Private residence Living Arrangements: Spouse/significant other Available Help at Discharge: Family Type of Home: House Home Access:  (1 small threshold step)     Home Layout: One level Home Equipment: [place] - 2 wheels;Bedside commode      Prior Function Level of Independence: Independent               Hand Dominance        Extremity/Trunk Assessment   Upper Extremity Assessment Upper Extremity Assessment: Overall WFL for tasks assessed    Lower Extremity Assessment Lower Extremity Assessment: Generalized weakness    Cervical / Trunk Assessment Cervical / Trunk Assessment: Normal  Communication   Communication: No difficulties  Cognition Arousal/Alertness: Awake/alert Behavior During Therapy: WFL for tasks assessed/performed Overall Cognitive Status: Within Functional Limits for tasks assessed                                        General Comments      Exercises Total Joint Exercises  Ankle Circles/Pumps: AROM;Both;10 reps Quad Sets: AROM;Right;5 reps Heel Slides: AAROM;Right;5 reps Hip ABduction/ADduction: AAROM;Right;5 reps Straight Leg Raises: AAROM;Right;5 reps Goniometric ROM: ~10-90   Assessment/Plan    PT Assessment Patient needs continued PT services  PT Problem List Decreased strength;Decreased range of motion;Decreased mobility;Decreased activity tolerance;Decreased balance;Decreased knowledge of use of DME;Pain       PT Treatment Interventions DME instruction;Gait training;Therapeutic  exercise;Balance training;Functional mobility training;Therapeutic activities;Patient/family education    PT Goals (Current goals can be found in the Care Plan section)  Acute Rehab PT Goals Patient Stated Goal: home today PT Goal Formulation: With patient/family Time For Goal Achievement: 10/10/20 Potential to Achieve Goals: Good    Frequency 7X/week   Barriers to discharge        Co-evaluation               AM-PAC PT "6 Clicks" Mobility  Outcome Measure Help needed turning from your back to your side while in a flat bed without using bedrails?: A Little Help needed moving from lying on your back to sitting on the side of a flat bed without using bedrails?: A Little Help needed moving to and from a bed to a chair (including a wheelchair)?: A Little Help needed standing up from a chair using your arms (e.g., wheelchair or bedside chair)?: A Little Help needed to walk in hospital room?: A Little Help needed climbing 3-5 steps with a railing? : A Little 6 Click Score: 18    End of Session Equipment Utilized During Treatment: Gait belt Activity Tolerance: Patient tolerated treatment well Patient left: in chair;with call bell/phone within reach;with family/visitor present   PT Visit Diagnosis: Pain;Other abnormalities of gait and mobility (R26.89) Pain - Right/Left: Right Pain - part of body: Knee    Time: 7510-2585 PT Time Calculation (min) (ACUTE ONLY): 20 min   Charges:   PT Evaluation $PT Eval Low Complexity: Morrison, PT Acute Rehabilitation  Office: [phone] Pager: [phone]

## 2020-09-26 NOTE — Progress Notes (Signed)
Subjective: 1 Day Post-Op Procedure(s) (LRB): RIGHT POLY LINER EXCHANGE (Right) Patient reports pain as moderate.  Does say that oxycodone makes her heart race too much.  Objective: Vital signs in last 24 hours: Temp:  [97.5 F (36.4 C)-98.9 F (37.2 C)] 98.1 F (36.7 C) (09/24 0956) Pulse Rate:  [70-83] 80 (09/24 0956) Resp:  [10-21] 18 (09/24 0956) BP: (114-160)/(56-98) 128/98 (09/24 0956) SpO2:  [95 %-100 %] 96 % (09/24 0956)  Intake/Output from previous day: 09/23 0701 - 09/24 0700 In: 3310 [P.O.:960; I.V.:2300; IV Piggyback:50] Out: 1875 [Urine:1850; Blood:25] Intake/Output this shift: Total I/O In: 240 [P.O.:240] Out: -   Recent Labs    09/26/20 0301  HGB 11.8*   Recent Labs    09/26/20 0301  WBC 10.0  RBC 3.54*  HCT 35.3*  PLT 263   No results for input(s): NA, K, CL, CO2, BUN, CREATININE, GLUCOSE, CALCIUM in the last 72 hours. No results for input(s): LABPT, INR in the last 72 hours.  Sensation intact distally Intact pulses distally Dorsiflexion/Plantar flexion intact Incision: dressing C/D/I Compartment soft   Assessment/Plan: 1 Day Post-Op Procedure(s) (LRB): RIGHT POLY LINER EXCHANGE (Right) Up with therapy Discharge home with home health today vs tomorrow depending on progress with therapy. Switch to oral dilaudid for pain control.      Mcarthur Rossetti 09/26/2020, 10:28 AM

## 2020-09-26 NOTE — TOC Transition Note (Signed)
Transition of Care [facility]) - CM/SW Discharge Note  Patient Details  Name: [name] MRN: [id number] Date of Birth: 1955-08-16  Transition of Care [facility]) CM/SW Contact:  Sherie Don, LCSW Phone Number: 09/26/2020, 9:34 AM  Clinical Narrative: Patient is expected to discharge home after working with PT. CSW spoke with patient to review discharge plan and assess for needs. Patient was prearranged with [place] for [place]. Patient has a rolling walker and 3N1 at home, so there are no DME needs at this time. TOC signing off.  Final next level of care: [place] Barriers to Discharge: No Barriers Identified  Patient Goals and CMS Choice Patient states their goals for this hospitalization and ongoing recovery are:: Discharge home with [place] CMS Medicare.gov Compare Post Acute Care list provided to:: Patient Choice offered to / list presented to : Patient  Discharge Plan and Services         DME Arranged: N/A DME Agency: NA HH Arranged: PT HH Agency: [place] Representative spoke with at [place] in orthopedist's office  Readmission Risk Interventions No flowsheet data found.

## 2020-09-27 MED ORDER — HYDROCODONE-ACETAMINOPHEN 5-325 MG PO TABS
1.0000 | ORAL_TABLET | ORAL | Status: DC | PRN
  Administered 2020-09-27 (×3): 2 via ORAL
  Filled 2020-09-27 (×3): qty 2

## 2020-09-27 MED ORDER — HYDROCODONE-ACETAMINOPHEN 5-325 MG PO TABS: 1.0000 | ORAL_TABLET | ORAL | 0 refills | Status: DC | PRN

## 2020-09-27 NOTE — Plan of Care (Signed)

## 2020-09-27 NOTE — Plan of Care (Signed)
°  Problem: Education: °Goal: Knowledge of the prescribed therapeutic regimen will improve °Outcome: Progressing °  °Problem: Clinical Measurements: °Goal: Postoperative complications will be avoided or minimized °Outcome: Progressing °  °Problem: Pain Management: °Goal: Pain level will decrease with appropriate interventions °Outcome: Progressing °  °

## 2020-09-27 NOTE — Progress Notes (Signed)
Provided discharge education/instructions, all questions and concerns addressed, Pt not in acute distress, discharged home with belongings accompanied by husband.

## 2020-09-27 NOTE — Progress Notes (Signed)
Subjective: 2 Days Post-Op Procedure(s) (LRB): RIGHT POLY LINER EXCHANGE (Right) Patient reports pain as severe.  Does say that oxycodone makes her heart race too much. She reports oral dilaudid makes her delirious and would like to switch to hydrocodone as discussed with Dr. Ninfa Linden yesterday.  Objective: Vital signs in last 24 hours: Temp:  [97.9 F (36.6 C)-98.7 F (37.1 C)] 98.4 F (36.9 C) (09/25 0612) Pulse Rate:  [75-84] 77 (09/25 0612) Resp:  [16-18] 16 (09/25 0612) BP: (111-140)/(46-98) 140/72 (09/25 0612) SpO2:  [95 %-99 %] 99 % (09/25 0612)  Intake/Output from previous day: 09/24 0701 - 09/25 0700 In: 720 [P.O.:720] Out: -  Intake/Output this shift: No intake/output data recorded.  Recent Labs    09/26/20 0301  HGB 11.8*    Recent Labs    09/26/20 0301  WBC 10.0  RBC 3.54*  HCT 35.3*  PLT 263    No results for input(s): NA, K, CL, CO2, BUN, CREATININE, GLUCOSE, CALCIUM in the last 72 hours. No results for input(s): LABPT, INR in the last 72 hours.  Sensation intact distally Intact pulses distally Dorsiflexion/Plantar flexion intact Incision: dressing C/D/I Compartment soft   Assessment/Plan: 2 Days Post-Op Procedure(s) (LRB): RIGHT POLY LINER EXCHANGE (Right) Up with therapy Discharge home with home health today pending pain control. Switch to hydrocodone for pain control.      Robb Matar 09/27/2020, 7:59 AM

## 2020-09-27 NOTE — Plan of Care (Signed)
  Problem: Education: Goal: Knowledge of the prescribed therapeutic regimen will improve 09/27/2020 1254 by Williams Che, RN Outcome: Progressing 09/27/2020 1112 by Williams Che, RN Outcome: Progressing   Problem: Activity: Goal: Ability to avoid complications of mobility impairment will improve 09/27/2020 1254 by Williams Che, RN Outcome: Progressing 09/27/2020 1112 by Williams Che, RN Outcome: Progressing   Problem: Clinical Measurements: Goal: Postoperative complications will be avoided or minimized 09/27/2020 1254 by Williams Che, RN Outcome: Progressing 09/27/2020 1112 by Williams Che, RN Outcome: Progressing   Problem: Pain Management: Goal: Pain level will decrease with appropriate interventions 09/27/2020 1254 by Williams Che, RN Outcome: Progressing 09/27/2020 1112 by Williams Che, RN Outcome: Progressing   Problem: Skin Integrity: Goal: Will show signs of wound healing 09/27/2020 1254 by Williams Che, RN Outcome: Progressing 09/27/2020 1112 by Williams Che, RN Outcome: Progressing

## 2020-09-27 NOTE — Progress Notes (Signed)
Physical Therapy Treatment Patient Details Name: Marsela Kuan MRN: [id number] DOB: [birth year] Today's Date: 09/27/2020   History of Present Illness 65 yo female s/p R TK revision-partial synevectomy, poly exchange 09/25/20. Hx of R TKA 2015, neuropathy, obesity, L4-S1 PLIF 2020    PT Comments    Pt participated well. Moderate pain during session. Okay to d/c from PT standpoint. D/C will depend on pain control today-will defer to nursing.     Recommendations for follow up therapy are one component of a multi-disciplinary discharge planning process, led by the attending physician.  Recommendations may be updated based on patient status, additional functional criteria and insurance authorization.  Follow Up Recommendations  Follow surgeon's recommendation for DC plan and follow-up therapies     Equipment Recommendations  None recommended by PT    Recommendations for Other Services       Precautions / Restrictions Precautions Precautions: Fall;Knee Restrictions Weight Bearing Restrictions: No Other Position/Activity Restrictions: WBAT     Mobility  Bed Mobility Overal bed mobility: Needs Assistance Bed Mobility: Supine to Sit;Sit to Supine     Supine to sit: Supervision;HOB elevated Sit to supine: Supervision;HOB elevated   General bed mobility comments: No assistance required.    Transfers Overall transfer level: Needs assistance Equipment used: Rolling walker (2 wheeled) Transfers: Sit to/from Stand Sit to Stand: Supervision         General transfer comment: Supv for safety. Cues for safety, hand placement. Increased time.  Ambulation/Gait Ambulation/Gait assistance: Supervision Gait Distance (Feet): 125 Feet Assistive device: Rolling walker (2 wheeled) Gait Pattern/deviations: Step-through pattern;Decreased stride length     General Gait Details: Supv for safety. Pt tolerated distance well.   Stairs             Wheelchair Mobility     Modified Rankin (Stroke Patients Only)       Balance                                            Cognition Arousal/Alertness: Awake/alert Behavior During Therapy: WFL for tasks assessed/performed Overall Cognitive Status: Within Functional Limits for tasks assessed                                        Exercises Total Joint Exercises Ankle Circles/Pumps: AAROM;Both;10 reps Quad Sets: AROM;Right;5 reps Goniometric ROM: ~10-65 degrees seated EOB    General Comments        Pertinent Vitals/Pain Pain Assessment: 0-10 Pain Score: 7  Pain Location: R knee Pain Descriptors / Indicators: Discomfort;Sore;Aching Pain Intervention(s): Limited activity within patient's tolerance;Monitored during session;Ice applied;Repositioned    Home Living                      Prior Function            PT Goals (current goals can now be found in the care plan section) Progress towards PT goals: Progressing toward goals    Frequency    7X/week      PT Plan Current plan remains appropriate    Co-evaluation              AM-PAC PT "6 Clicks" Mobility   Outcome Measure  Help needed turning from your back to your side while in a flat bed without  using bedrails?: A Little Help needed moving from lying on your back to sitting on the side of a flat bed without using bedrails?: A Little Help needed moving to and from a bed to a chair (including a wheelchair)?: A Little Help needed standing up from a chair using your arms (e.g., wheelchair or bedside chair)?: A Little Help needed to walk in hospital room?: A Little Help needed climbing 3-5 steps with a railing? : A Little 6 Click Score: 18    End of Session Equipment Utilized During Treatment: Gait belt Activity Tolerance: Patient tolerated treatment well Patient left: in bed;with call bell/phone within reach;with family/visitor present   PT Visit Diagnosis: Pain;Other abnormalities  of gait and mobility (R26.89) Pain - Right/Left: Right Pain - part of body: Knee     Time: 3785-8850 PT Time Calculation (min) (ACUTE ONLY): 18 min  Charges:  $Gait Training: 8-22 mins                         Doreatha Massed, PT Acute Rehabilitation  Office: [phone] Pager: [phone]

## 2020-09-28 ENCOUNTER — Other Ambulatory Visit (HOSPITAL_COMMUNITY): Payer: Self-pay

## 2020-09-28 NOTE — Discharge Summary (Addendum)
Patient ID: Tanishia Lemaster MRN: [id number] DOB/AGE: Oct 02, 1955 65 y.o.  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 ([place]) Active Problems:   Status post revision of total replacement of right knee   Past Medical History:  Diagnosis Date   Anginal pain ([place])    "on and off" (11/01/2011) - anxiety related   Anxiety    takes Paxil daily and Xanax prn   Arthritis    "knees" (10/16/2013)   Chronic back pain    facet disease and bulging disc; "thoracic and lower back" (10/16/2013)   Constipation    r/t pain meds;takes stool softener daily   Depression    Dysrhythmia    rapid HR on occasion-takes Propranolol daily   Family history of anesthesia complication    ":PONV; mom and sisters"   History of bladder infections    > 18yr ago   History of kidney stones    Hyperlipidemia    takes LIpitor daily   Hypothyroidism    takes Synthroid daily   Insomnia    Joint pain    Joint swelling    Muscle spasms of head or neck    lumbar and thoracic;takes Robaxin prn   Peripheral neuropathy    PONV (postoperative nausea and vomiting)    Rosacea    Seasonal allergies    takes allegra prn   Sleep apnea     Surgeries: Procedure(s): RIGHT POLY LINER EXCHANGE on 09/25/2020   Consultants (if any):   Discharged Condition: Improved  Hospital Course: Jonathon Castelo is an 65 y.o. 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    She was given perioperative antibiotics:  Anti-infectives (From admission, onward)    Start     Dose/Rate Route Frequency Ordered Stop   09/25/20 1900  ceFAZolin (ANCEF) IVPB 1 g/50 mL premix        1 g 100 mL/hr over 30  Minutes Intravenous Every 6 hours 09/25/20 1603 09/26/20 0321   09/25/20 1030  ceFAZolin (ANCEF) IVPB 2g/100 mL premix        2 g 200 mL/hr over 30 Minutes Intravenous On call to O.R. 09/25/20 1025 09/25/20 1308     .  She was given sequential compression devices, early ambulation, and appropriate chemoprophylaxis for DVT prophylaxis.  She benefited maximally from the hospital stay and there were no complications.    Recent vital signs:  Vitals:   09/27/20 0857 09/27/20 1336  BP: 139/63 (!) 131/54  Pulse: 77 82  Resp: 15 17  Temp: 98.2 F (36.8 C) 98.3 F (36.8 C)  SpO2: 99% 95%    Recent laboratory studies:  Lab Results  Component Value Date   HGB 11.8 (L) 09/26/2020   HGB 13.2 09/15/2020   HGB 13.0 04/21/2020   Lab Results  Component Value Date   WBC 10.0 09/26/2020   PLT 263 09/26/2020   Lab Results  Component Value Date   INR 0.98 10/08/2013   Lab Results  Component Value Date   NA 141 09/15/2020   K 4.1 09/15/2020   CL 107 09/15/2020   CO2 27 09/15/2020   BUN 23 09/15/2020   CREATININE 0.75 09/15/2020   GLUCOSE 96 09/15/2020  Discharge Medications:   Allergies as of 09/27/2020       Reactions   Morphine And Related Other (See Comments)   Feels like bugs crawling over her   Nsaids Hives   Tape Other (See Comments)   Blisters with tape after surgical procedures   Tapentadol Other (See Comments)   Blisters with tape after surgical procedures   Levothyroxine    Other reaction(s): hot flashes,    Morphine Sulfate Hives   Naproxen Sodium    Nitrofurantoin Hives   Tramadol    Other reaction(s): Pt reports drug interaction with Paxil   Tyloxapol    Other reaction(s): hyper   Buspar [buspirone] Palpitations   Ciprofloxacin Nausea Only   Upset stomach   Codeine Other (See Comments)   Hyper   Gabapentin Other (See Comments)   Suicidal ideation.   Lodine [etodolac] Nausea Only   Upset Stomach   Lyrica [pregabalin] Other (See Comments)    suicidal ideation   Niacin And Related Other (See Comments)   flushing   Pravastatin Other (See Comments)   achiness   Septra [sulfamethoxazole-trimethoprim] Other (See Comments)   Stomach pains   Tylox [oxycodone-acetaminophen] Other (See Comments)   Hyper   Wellbutrin [bupropion] Other (See Comments)   dizzy        Medication List     TAKE these medications    acetaminophen 500 MG tablet Commonly known as: TYLENOL Take 500 mg by mouth every 6 (six) hours as needed for moderate pain. Notes to patient: Last dose given 09/24 07:05pm   ALPRAZolam 0.5 MG tablet Commonly known as: XANAX Take 0.5 tablets (0.25 mg total) by mouth 3 (three) times daily as needed for anxiety. Notes to patient: Resume home regimen   amoxicillin 500 MG capsule Commonly known as: AMOXIL Take 2,000 mg by mouth See admin instructions. Take 2000 mg by mouth 1 hour prior to dental procedure. Notes to patient: Resume home regimen   apixaban 2.5 MG Tabs tablet Commonly known as: ELIQUIS Take 1 tablet (2.5 mg total) by mouth every 12 (twelve) hours.   B COMPLEX PO Place 1 tablet under the tongue daily. Notes to patient: Resume home regimen   betamethasone dipropionate 0.05 % cream Apply 1 application topically 2 (two) times daily as needed (irritation). Notes to patient: Resume home regimen   bisacodyl 5 MG EC tablet Commonly known as: DULCOLAX Take 5 mg by mouth daily as needed for moderate constipation. Notes to patient: Resume home regimen   ezetimibe 10 MG tablet Commonly known as: ZETIA Take 10 mg by mouth daily.   fexofenadine 180 MG tablet Commonly known as: ALLEGRA Take 180 mg by mouth daily as needed for allergies. Notes to patient: Resume home regimen   fluticasone 50 MCG/ACT nasal spray Commonly known as: FLONASE Place 1-2 sprays into both nostrils daily as needed for allergies or rhinitis. Notes to patient: Resume home regimen   HYDROcodone-acetaminophen 5-325 MG  tablet Commonly known as: NORCO/VICODIN Take 1 tablet by mouth every 4 (four) hours as needed for moderate pain. What changed: when to take this Notes to patient: Last dose given 09/25 01:19pm // 05:20pm   levothyroxine 150 MCG tablet Commonly known as: SYNTHROID Take 150 mcg by mouth daily before breakfast.   multivitamin with minerals Tabs tablet Take 1 tablet by mouth daily.   Ozempic (0.25 or 0.5 MG/DOSE) 2 MG/1.5ML Sopn Generic drug: Semaglutide(0.25 or 0.5MG /DOS) Inject 0.5 mg into the skin every Friday. Notes to patient: Resume home regimen  PARoxetine 20 MG tablet Commonly known as: PAXIL TAKE 1 TABLET BY MOUTH IN THE MORNING, AT NOON, AND AT BEDTIME What changed:  how much to take how to take this when to take this additional instructions   pramipexole 0.25 MG tablet Commonly known as: MIRAPEX TAKE 1 TABLET BY MOUTH EVERY MORNING AND1 AND 1/2 TABS AT NIGHT   propranolol ER 60 MG 24 hr capsule Commonly known as: INDERAL LA TAKE 1 CAPSULE BY MOUTH ONCE DAILY Notes to patient: 09/26 morning   rosuvastatin 5 MG tablet Commonly known as: CRESTOR Take 1 tablet (5 mg total) by mouth every Monday, Wednesday, and Friday. Notes to patient: Resume home regimen // last dose given 09/23 09:28pm   tiZANidine 4 MG tablet Commonly known as: ZANAFLEX Take 0.5-1 tablets (2-4 mg total) by mouth every 8 (eight) hours as needed for muscle spasms. What changed: when to take this   vitamin C 1000 MG tablet Take 1,000 mg by mouth daily.   Vitamin D-3 125 MCG (5000 UT) Tabs Take 5,000 Units by mouth daily.        Diagnostic Studies: DG Knee Right Port  Result Date: 09/25/2020 CLINICAL DATA:  Status post right knee replacement. EXAM: PORTABLE RIGHT KNEE - 1-2 VIEW COMPARISON:  Radiograph 04/21/2020 FINDINGS: Right knee arthroplasty in expected alignment. No periprosthetic lucency or fracture. The patella has been resected. Chronic appearing soft tissue calcifications in the  region of the quadriceps tendon. Recent postsurgical change includes air and edema in the soft tissues and joint space. Anterior skin staples. IMPRESSION: Right knee arthroplasty without immediate postoperative complication. Electronically Signed   By: Keith Rake M.D.   On: 09/25/2020 15:51   MM 3D SCREEN BREAST BILATERAL  Result Date: 09/07/2020 CLINICAL DATA:  Screening. EXAM: DIGITAL SCREENING BILATERAL MAMMOGRAM WITH TOMOSYNTHESIS AND CAD TECHNIQUE: Bilateral screening digital craniocaudal and mediolateral oblique mammograms were obtained. Bilateral screening digital breast tomosynthesis was performed. The images were evaluated with computer-aided detection. COMPARISON:  Previous exam(s). ACR Breast Density Category b: There are scattered areas of fibroglandular density. FINDINGS: There are no findings suspicious for malignancy. IMPRESSION: No mammographic evidence of malignancy. A result letter of this screening mammogram will be mailed directly to the patient. RECOMMENDATION: Screening mammogram in one year. (Code:[id number]) BI-RADS CATEGORY  1: Negative. Electronically Signed   By: Lovey Newcomer M.D.   On: 09/07/2020 19:28    Disposition: Discharge disposition: 01-Home or [place]     Mcarthur Rossetti, MD Follow up in 2 week(s).   Specialty: Orthopedic Surgery Contact information: [place] [address] [place], [place] Follow up.   Specialty: [place] Why: PT Contact information: [address] [place] [address] [phone]                  Signed: Vanetta Mulders 09/28/2020, 8:02 AM

## 2020-09-29 ENCOUNTER — Encounter (HOSPITAL_COMMUNITY): Payer: Self-pay | Admitting: Orthopaedic Surgery

## 2020-09-29 DIAGNOSIS — T84062D Wear of articular bearing surface of internal prosthetic right knee joint, subsequent encounter: Secondary | ICD-10-CM | POA: Diagnosis not present

## 2020-09-29 DIAGNOSIS — I452 Bifascicular block: Secondary | ICD-10-CM | POA: Diagnosis not present

## 2020-09-29 DIAGNOSIS — G629 Polyneuropathy, unspecified: Secondary | ICD-10-CM | POA: Diagnosis not present

## 2020-09-29 DIAGNOSIS — F411 Generalized anxiety disorder: Secondary | ICD-10-CM | POA: Diagnosis not present

## 2020-09-29 DIAGNOSIS — E7800 Pure hypercholesterolemia, unspecified: Secondary | ICD-10-CM | POA: Diagnosis not present

## 2020-09-29 DIAGNOSIS — F331 Major depressive disorder, recurrent, moderate: Secondary | ICD-10-CM | POA: Diagnosis not present

## 2020-09-29 DIAGNOSIS — E039 Hypothyroidism, unspecified: Secondary | ICD-10-CM | POA: Diagnosis not present

## 2020-09-29 DIAGNOSIS — M4316 Spondylolisthesis, lumbar region: Secondary | ICD-10-CM | POA: Diagnosis not present

## 2020-09-29 DIAGNOSIS — I10 Essential (primary) hypertension: Secondary | ICD-10-CM | POA: Diagnosis not present

## 2020-09-30 ENCOUNTER — Other Ambulatory Visit: Payer: Self-pay | Admitting: Orthopaedic Surgery

## 2020-09-30 ENCOUNTER — Encounter: Payer: Self-pay | Admitting: Orthopaedic Surgery

## 2020-09-30 MED ORDER — HYDROCODONE-ACETAMINOPHEN 7.5-325 MG PO TABS: 1.0000 | ORAL_TABLET | Freq: Four times a day (QID) | ORAL | 0 refills | Status: DC | PRN

## 2020-09-30 NOTE — Progress Notes (Unsigned)
norco

## 2020-09-30 NOTE — Telephone Encounter (Signed)
Please advise 

## 2020-10-02 DIAGNOSIS — F411 Generalized anxiety disorder: Secondary | ICD-10-CM | POA: Diagnosis not present

## 2020-10-02 DIAGNOSIS — M4316 Spondylolisthesis, lumbar region: Secondary | ICD-10-CM | POA: Diagnosis not present

## 2020-10-02 DIAGNOSIS — G629 Polyneuropathy, unspecified: Secondary | ICD-10-CM | POA: Diagnosis not present

## 2020-10-02 DIAGNOSIS — I452 Bifascicular block: Secondary | ICD-10-CM | POA: Diagnosis not present

## 2020-10-02 DIAGNOSIS — E7800 Pure hypercholesterolemia, unspecified: Secondary | ICD-10-CM | POA: Diagnosis not present

## 2020-10-02 DIAGNOSIS — T84062D Wear of articular bearing surface of internal prosthetic right knee joint, subsequent encounter: Secondary | ICD-10-CM | POA: Diagnosis not present

## 2020-10-02 DIAGNOSIS — I10 Essential (primary) hypertension: Secondary | ICD-10-CM | POA: Diagnosis not present

## 2020-10-02 DIAGNOSIS — E039 Hypothyroidism, unspecified: Secondary | ICD-10-CM | POA: Diagnosis not present

## 2020-10-02 DIAGNOSIS — F331 Major depressive disorder, recurrent, moderate: Secondary | ICD-10-CM | POA: Diagnosis not present

## 2020-10-06 DIAGNOSIS — M4316 Spondylolisthesis, lumbar region: Secondary | ICD-10-CM | POA: Diagnosis not present

## 2020-10-06 DIAGNOSIS — T84062D Wear of articular bearing surface of internal prosthetic right knee joint, subsequent encounter: Secondary | ICD-10-CM | POA: Diagnosis not present

## 2020-10-06 DIAGNOSIS — I452 Bifascicular block: Secondary | ICD-10-CM | POA: Diagnosis not present

## 2020-10-06 DIAGNOSIS — E7800 Pure hypercholesterolemia, unspecified: Secondary | ICD-10-CM | POA: Diagnosis not present

## 2020-10-06 DIAGNOSIS — I10 Essential (primary) hypertension: Secondary | ICD-10-CM | POA: Diagnosis not present

## 2020-10-06 DIAGNOSIS — F411 Generalized anxiety disorder: Secondary | ICD-10-CM | POA: Diagnosis not present

## 2020-10-06 DIAGNOSIS — E039 Hypothyroidism, unspecified: Secondary | ICD-10-CM | POA: Diagnosis not present

## 2020-10-06 DIAGNOSIS — F331 Major depressive disorder, recurrent, moderate: Secondary | ICD-10-CM | POA: Diagnosis not present

## 2020-10-06 DIAGNOSIS — G629 Polyneuropathy, unspecified: Secondary | ICD-10-CM | POA: Diagnosis not present

## 2020-10-07 DIAGNOSIS — E7800 Pure hypercholesterolemia, unspecified: Secondary | ICD-10-CM | POA: Diagnosis not present

## 2020-10-07 DIAGNOSIS — E039 Hypothyroidism, unspecified: Secondary | ICD-10-CM | POA: Diagnosis not present

## 2020-10-07 DIAGNOSIS — I452 Bifascicular block: Secondary | ICD-10-CM | POA: Diagnosis not present

## 2020-10-07 DIAGNOSIS — T84062D Wear of articular bearing surface of internal prosthetic right knee joint, subsequent encounter: Secondary | ICD-10-CM | POA: Diagnosis not present

## 2020-10-07 DIAGNOSIS — M4316 Spondylolisthesis, lumbar region: Secondary | ICD-10-CM | POA: Diagnosis not present

## 2020-10-07 DIAGNOSIS — F411 Generalized anxiety disorder: Secondary | ICD-10-CM | POA: Diagnosis not present

## 2020-10-07 DIAGNOSIS — F331 Major depressive disorder, recurrent, moderate: Secondary | ICD-10-CM | POA: Diagnosis not present

## 2020-10-07 DIAGNOSIS — I10 Essential (primary) hypertension: Secondary | ICD-10-CM | POA: Diagnosis not present

## 2020-10-07 DIAGNOSIS — G629 Polyneuropathy, unspecified: Secondary | ICD-10-CM | POA: Diagnosis not present

## 2020-10-08 ENCOUNTER — Encounter: Payer: Self-pay | Admitting: Orthopaedic Surgery

## 2020-10-08 ENCOUNTER — Ambulatory Visit (INDEPENDENT_AMBULATORY_CARE_PROVIDER_SITE_OTHER): Payer: Medicare PPO | Admitting: Orthopaedic Surgery

## 2020-10-08 DIAGNOSIS — G629 Polyneuropathy, unspecified: Secondary | ICD-10-CM | POA: Diagnosis not present

## 2020-10-08 DIAGNOSIS — Z96651 Presence of right artificial knee joint: Secondary | ICD-10-CM

## 2020-10-08 DIAGNOSIS — I10 Essential (primary) hypertension: Secondary | ICD-10-CM | POA: Diagnosis not present

## 2020-10-08 DIAGNOSIS — F331 Major depressive disorder, recurrent, moderate: Secondary | ICD-10-CM | POA: Diagnosis not present

## 2020-10-08 DIAGNOSIS — M4316 Spondylolisthesis, lumbar region: Secondary | ICD-10-CM | POA: Diagnosis not present

## 2020-10-08 DIAGNOSIS — F411 Generalized anxiety disorder: Secondary | ICD-10-CM | POA: Diagnosis not present

## 2020-10-08 DIAGNOSIS — E039 Hypothyroidism, unspecified: Secondary | ICD-10-CM | POA: Diagnosis not present

## 2020-10-08 DIAGNOSIS — E7800 Pure hypercholesterolemia, unspecified: Secondary | ICD-10-CM | POA: Diagnosis not present

## 2020-10-08 DIAGNOSIS — I452 Bifascicular block: Secondary | ICD-10-CM | POA: Diagnosis not present

## 2020-10-08 DIAGNOSIS — T84062D Wear of articular bearing surface of internal prosthetic right knee joint, subsequent encounter: Secondary | ICD-10-CM | POA: Diagnosis not present

## 2020-10-08 MED ORDER — HYDROCODONE-ACETAMINOPHEN 7.5-325 MG PO TABS: 1.0000 | ORAL_TABLET | Freq: Four times a day (QID) | ORAL | 0 refills | Status: DC | PRN

## 2020-10-08 NOTE — Progress Notes (Signed)
The patient is 2 weeks status post a left knee polyliner exchange.  She had synovitis in her knee with polyliner wear.  We were able to upsize her poly as well.  She is she is doing well and has no problems she is ambulate with a walker but using a cane at home.  She says her motion is been able to flex back to 110 degrees with her right knee and she is very pleased overall.  Her son is also a physical therapist.  She feels like she can transition already to home exercise program and based on exam today I agree with this.  Her calf is soft and her foot is not swollen.  I removed all the staples in place Steri-Strips.  She has been on Eliquis twice a day and we put her on this.  At this point she can stop her Eliquis and stop wearing the TED hose.  She will transition to home exercise program.  However, she will let us know if she ends up needing outpatient therapy and we can set this up.  I will send in some more pain medication for her.  All questions and concerns were answered and addressed.  I would like to see her back in 4 weeks to see how she is doing overall but no x-rays are needed.

## 2020-10-12 DIAGNOSIS — F411 Generalized anxiety disorder: Secondary | ICD-10-CM | POA: Diagnosis not present

## 2020-10-12 DIAGNOSIS — I10 Essential (primary) hypertension: Secondary | ICD-10-CM | POA: Diagnosis not present

## 2020-10-12 DIAGNOSIS — E039 Hypothyroidism, unspecified: Secondary | ICD-10-CM | POA: Diagnosis not present

## 2020-10-12 DIAGNOSIS — T84062D Wear of articular bearing surface of internal prosthetic right knee joint, subsequent encounter: Secondary | ICD-10-CM | POA: Diagnosis not present

## 2020-10-12 DIAGNOSIS — E7800 Pure hypercholesterolemia, unspecified: Secondary | ICD-10-CM | POA: Diagnosis not present

## 2020-10-12 DIAGNOSIS — I452 Bifascicular block: Secondary | ICD-10-CM | POA: Diagnosis not present

## 2020-10-12 DIAGNOSIS — G629 Polyneuropathy, unspecified: Secondary | ICD-10-CM | POA: Diagnosis not present

## 2020-10-12 DIAGNOSIS — M4316 Spondylolisthesis, lumbar region: Secondary | ICD-10-CM | POA: Diagnosis not present

## 2020-10-12 DIAGNOSIS — F331 Major depressive disorder, recurrent, moderate: Secondary | ICD-10-CM | POA: Diagnosis not present

## 2020-10-13 DIAGNOSIS — E039 Hypothyroidism, unspecified: Secondary | ICD-10-CM | POA: Diagnosis not present

## 2020-10-13 DIAGNOSIS — I10 Essential (primary) hypertension: Secondary | ICD-10-CM | POA: Diagnosis not present

## 2020-10-13 DIAGNOSIS — G629 Polyneuropathy, unspecified: Secondary | ICD-10-CM | POA: Diagnosis not present

## 2020-10-13 DIAGNOSIS — F331 Major depressive disorder, recurrent, moderate: Secondary | ICD-10-CM | POA: Diagnosis not present

## 2020-10-13 DIAGNOSIS — T84062D Wear of articular bearing surface of internal prosthetic right knee joint, subsequent encounter: Secondary | ICD-10-CM | POA: Diagnosis not present

## 2020-10-13 DIAGNOSIS — F411 Generalized anxiety disorder: Secondary | ICD-10-CM | POA: Diagnosis not present

## 2020-10-13 DIAGNOSIS — I452 Bifascicular block: Secondary | ICD-10-CM | POA: Diagnosis not present

## 2020-10-13 DIAGNOSIS — E7800 Pure hypercholesterolemia, unspecified: Secondary | ICD-10-CM | POA: Diagnosis not present

## 2020-10-13 DIAGNOSIS — M4316 Spondylolisthesis, lumbar region: Secondary | ICD-10-CM | POA: Diagnosis not present

## 2020-10-27 ENCOUNTER — Other Ambulatory Visit: Payer: Medicare PPO

## 2020-10-27 ENCOUNTER — Other Ambulatory Visit: Payer: Self-pay

## 2020-10-27 DIAGNOSIS — R635 Abnormal weight gain: Secondary | ICD-10-CM | POA: Diagnosis not present

## 2020-10-27 DIAGNOSIS — R7303 Prediabetes: Secondary | ICD-10-CM | POA: Diagnosis not present

## 2020-10-27 DIAGNOSIS — Z7689 Persons encountering health services in other specified circumstances: Secondary | ICD-10-CM | POA: Diagnosis not present

## 2020-10-27 DIAGNOSIS — Z9884 Bariatric surgery status: Secondary | ICD-10-CM | POA: Diagnosis not present

## 2020-10-27 DIAGNOSIS — Z5181 Encounter for therapeutic drug level monitoring: Secondary | ICD-10-CM | POA: Diagnosis not present

## 2020-10-27 DIAGNOSIS — E7800 Pure hypercholesterolemia, unspecified: Secondary | ICD-10-CM

## 2020-10-27 DIAGNOSIS — Z6841 Body Mass Index (BMI) 40.0 and over, adult: Secondary | ICD-10-CM | POA: Diagnosis not present

## 2020-10-27 DIAGNOSIS — Z79899 Other long term (current) drug therapy: Secondary | ICD-10-CM | POA: Diagnosis not present

## 2020-10-27 LAB — HEPATIC FUNCTION PANEL
ALT: 13 IU/L (ref 0–32)
AST: 21 IU/L (ref 0–40)
Albumin: 4.3 g/dL (ref 3.8–4.8)
Alkaline Phosphatase: 79 IU/L (ref 44–121)
Bilirubin Total: 0.6 mg/dL (ref 0.0–1.2)
Bilirubin, Direct: 0.18 mg/dL (ref 0.00–0.40)
Total Protein: 6.7 g/dL (ref 6.0–8.5)

## 2020-10-27 LAB — LIPID PANEL
Chol/HDL Ratio: 2.5 ratio (ref 0.0–4.4)
Cholesterol, Total: 161 mg/dL (ref 100–199)
HDL: 64 mg/dL (ref >39)
LDL Chol Calc (NIH): 78 mg/dL (ref 0–99)
Triglycerides: 103 mg/dL (ref 0–149)
VLDL Cholesterol Cal: 19 mg/dL (ref 5–40)

## 2020-10-30 ENCOUNTER — Other Ambulatory Visit: Payer: Self-pay | Admitting: Psychiatry

## 2020-10-30 DIAGNOSIS — F411 Generalized anxiety disorder: Secondary | ICD-10-CM

## 2020-10-30 NOTE — Telephone Encounter (Signed)
Call to RS

## 2020-11-03 NOTE — Telephone Encounter (Signed)
Called pt and LVM

## 2020-11-05 ENCOUNTER — Other Ambulatory Visit: Payer: Self-pay

## 2020-11-05 ENCOUNTER — Ambulatory Visit (INDEPENDENT_AMBULATORY_CARE_PROVIDER_SITE_OTHER): Payer: Medicare PPO | Admitting: Orthopaedic Surgery

## 2020-11-05 ENCOUNTER — Encounter: Payer: Self-pay | Admitting: Orthopaedic Surgery

## 2020-11-05 DIAGNOSIS — Z96651 Presence of right artificial knee joint: Secondary | ICD-10-CM | POA: Insufficient documentation

## 2020-11-05 DIAGNOSIS — T84062D Wear of articular bearing surface of internal prosthetic right knee joint, subsequent encounter: Secondary | ICD-10-CM

## 2020-11-05 DIAGNOSIS — Z96652 Presence of left artificial knee joint: Secondary | ICD-10-CM

## 2020-11-05 NOTE — Progress Notes (Signed)
The patient is now 6-week status post a right knee polyliner exchange.  She says she is doing very well and the knee feels stable.  She has a son is a physical therapist and has been able to get her through therapy without having to go to outpatient physical therapy.  She reports improved range of motion and strength and has no issues she states.  She is walking already without any assistive device.  She does have a history of a left total knee arthroplasty that one of my partners performed as well.  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  The right operative knee has a well-healed surgical incision.  Her calf is soft and her range of motion is almost full.  The knee feels stable on exam.  From my standpoint since she is doing so well I do not need to see her back for 3 months unless she is having issues.  I would like an AP and lateral both knees at that visit.  All questions and concerns were answered and addressed.

## 2020-11-12 DIAGNOSIS — M5416 Radiculopathy, lumbar region: Secondary | ICD-10-CM | POA: Diagnosis not present

## 2020-11-17 ENCOUNTER — Other Ambulatory Visit: Payer: Self-pay | Admitting: Psychiatry

## 2020-11-17 NOTE — Telephone Encounter (Signed)
Please schedule appt

## 2020-11-17 NOTE — Telephone Encounter (Signed)
Last seen 3/24 due back at 6 months

## 2020-11-30 DIAGNOSIS — M5416 Radiculopathy, lumbar region: Secondary | ICD-10-CM | POA: Diagnosis not present

## 2020-12-22 DIAGNOSIS — Z6841 Body Mass Index (BMI) 40.0 and over, adult: Secondary | ICD-10-CM | POA: Diagnosis not present

## 2020-12-22 DIAGNOSIS — Z9884 Bariatric surgery status: Secondary | ICD-10-CM | POA: Diagnosis not present

## 2020-12-22 DIAGNOSIS — Z7689 Persons encountering health services in other specified circumstances: Secondary | ICD-10-CM | POA: Diagnosis not present

## 2020-12-22 DIAGNOSIS — R7303 Prediabetes: Secondary | ICD-10-CM | POA: Diagnosis not present

## 2020-12-22 DIAGNOSIS — Z79899 Other long term (current) drug therapy: Secondary | ICD-10-CM | POA: Diagnosis not present

## 2021-01-06 DIAGNOSIS — M5416 Radiculopathy, lumbar region: Secondary | ICD-10-CM | POA: Diagnosis not present

## 2021-01-06 DIAGNOSIS — Z981 Arthrodesis status: Secondary | ICD-10-CM | POA: Diagnosis not present

## 2021-01-06 DIAGNOSIS — M461 Sacroiliitis, not elsewhere classified: Secondary | ICD-10-CM | POA: Diagnosis not present

## 2021-02-02 DIAGNOSIS — M461 Sacroiliitis, not elsewhere classified: Secondary | ICD-10-CM | POA: Diagnosis not present

## 2021-02-04 ENCOUNTER — Encounter: Payer: Self-pay | Admitting: Orthopaedic Surgery

## 2021-02-04 ENCOUNTER — Other Ambulatory Visit: Payer: Self-pay

## 2021-02-04 ENCOUNTER — Ambulatory Visit (INDEPENDENT_AMBULATORY_CARE_PROVIDER_SITE_OTHER): Payer: Medicare PPO

## 2021-02-04 ENCOUNTER — Ambulatory Visit (INDEPENDENT_AMBULATORY_CARE_PROVIDER_SITE_OTHER): Payer: Medicare PPO | Admitting: Orthopaedic Surgery

## 2021-02-04 ENCOUNTER — Ambulatory Visit: Payer: Self-pay

## 2021-02-04 DIAGNOSIS — Z96652 Presence of left artificial knee joint: Secondary | ICD-10-CM | POA: Diagnosis not present

## 2021-02-04 DIAGNOSIS — T84062D Wear of articular bearing surface of internal prosthetic right knee joint, subsequent encounter: Secondary | ICD-10-CM

## 2021-02-04 NOTE — Progress Notes (Signed)
The patient is now over 4 months out from upsizing of a polyliner of her right total knee arthroplasty.  The original knee arthroplasty was done by one of my partners.  She said that knee has done really well overall and she has good range of motion of the right knee.  She does have a revision arthroplasty of her left knee.  That has been somewhat painful to her.  She does report just weakness in general on both her legs.  On exam both knees move smoothly and fluidly.  Both knees feel ligamentously stable.  X-rays of both knees show well-seated knee replacements.  The left knee is a revision arthroplasty and the components appear well-seated with a large cement mantle and good space in the knee.  I do feel that she would benefit from outpatient physical therapy to work on strengthening her bilateral lower extremities with strengthening of her hips and her knees and helping her in general improve her mobility.  Her range of motion is already good so I think this is more about helping with her balance and coordination to strengthening overall and she agrees with this.  Her son is a physical therapist so I do feel that they would be able to eventually design her home exercise program and he would be able to work with her at home but I feel like it is better to have outpatient physical therapy first.  She agrees with this.  We will set this up and then see her back in

## 2021-02-12 ENCOUNTER — Other Ambulatory Visit: Payer: Self-pay

## 2021-02-19 DIAGNOSIS — R7303 Prediabetes: Secondary | ICD-10-CM | POA: Diagnosis not present

## 2021-02-19 DIAGNOSIS — E039 Hypothyroidism, unspecified: Secondary | ICD-10-CM | POA: Diagnosis not present

## 2021-02-22 ENCOUNTER — Other Ambulatory Visit: Payer: Self-pay | Admitting: Psychiatry

## 2021-03-01 ENCOUNTER — Ambulatory Visit: Payer: Medicare PPO | Admitting: Physical Therapy

## 2021-03-02 DIAGNOSIS — Z6841 Body Mass Index (BMI) 40.0 and over, adult: Secondary | ICD-10-CM | POA: Diagnosis not present

## 2021-03-02 DIAGNOSIS — R7303 Prediabetes: Secondary | ICD-10-CM | POA: Diagnosis not present

## 2021-03-02 DIAGNOSIS — Z9884 Bariatric surgery status: Secondary | ICD-10-CM | POA: Diagnosis not present

## 2021-03-02 DIAGNOSIS — Z79899 Other long term (current) drug therapy: Secondary | ICD-10-CM | POA: Diagnosis not present

## 2021-03-03 DIAGNOSIS — M461 Sacroiliitis, not elsewhere classified: Secondary | ICD-10-CM | POA: Diagnosis not present

## 2021-03-03 DIAGNOSIS — M5442 Lumbago with sciatica, left side: Secondary | ICD-10-CM | POA: Diagnosis not present

## 2021-03-09 NOTE — Therapy (Signed)
OUTPATIENT PHYSICAL THERAPY LOWER EXTREMITY EVALUATION  Patient Name: [name] MRN: [id number] DOB:Apr 03, 1955, 66 y.o., female Today's Date: 03/10/2021 Referring diagnosis? 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 (if different than referring diagnosis) M25.561 What was this (referring dx) caused by? '[x]'$  Surgery '[]'$  Fall '[x]'$  Ongoing issue '[x]'$  Arthritis '[]'$  Other: ____________  Laterality: '[]'$  Rt '[]'$  Lt '[x]'$  Both  Check all possible CPT codes:  *CHOOSE 10 OR LESS*    '[x]'$  97110 (Therapeutic Exercise)  '[]'$  92507 (SLP Treatment)  '[x]'$  97112 (Neuro Re-ed)   '[]'$  92526 (Swallowing Treatment)   '[x]'$  97116 (Gait Training)   '[]'$  [id number] (Cognitive Training, 1st 15 minutes) '[x]'$  97140 (Manual Therapy)   '[]'$  97130 (Cognitive Training, each add'l 15 minutes)  '[x]'$  97530 (Therapeutic Activities)  '[]'$  Other, List CPT Code ____________    '[x]'$  97535 (Self Care)       '[]'$  All codes above (97110 - 97535)  '[]'$  97012 (Mechanical Traction)  '[x]'$  97014 (E-stim Unattended)  '[]'$  97032 (E-stim manual)  '[]'$  97033 (Ionto)  '[x]'$  97035 (Ultrasound)  '[]'$  97760 (Orthotic Fit) '[]'$  97750 (Physical Performance Training) '[]'$  [id number] (Aquatic Therapy) '[]'$  97034 (Contrast Bath) '[]'$  97018 (Paraffin) '[]'$  97597 (Wound Care 1st 20 sq cm) '[]'$  97598 (Wound Care each add'l 20 sq cm) '[]'$  97016 (Vasopneumatic Device) '[]'$  [phone] Comptroller) '[]'$  [phone] (Prosthetic Training)    Patient Name: [name] MRN: [id number] DOB:02-14-1955, 66 y.o., female Today's Date: 03/10/2021   PT End of Session - 03/10/21 1653     Visit Number 1    Number of Visits 15    Date for PT Re-Evaluation 05/05/21    Authorization Type [employer]    PT Start Time 1355    PT Stop Time 1440    PT Time Calculation (min) 45 min    Activity Tolerance Patient tolerated treatment well    Behavior During Therapy WFL for tasks assessed/performed             Past Medical  History:  Diagnosis Date   Anginal pain ([place])    "on and off" (11/01/2011) - anxiety related   Anxiety    takes Paxil daily and Xanax prn   Arthritis    "knees" (10/16/2013)   Chronic back pain    facet disease and bulging disc; "thoracic and lower back" (10/16/2013)   Constipation    r/t pain meds;takes stool softener daily   Depression    Dysrhythmia    rapid HR on occasion-takes Propranolol daily   Family history of anesthesia complication    ":PONV; mom and sisters"   History of bladder infections    > 25yrago   History of kidney stones    Hyperlipidemia    takes LIpitor daily   Hypothyroidism    takes Synthroid daily   Insomnia    Joint pain    Joint swelling    Muscle spasms of head or neck    lumbar and thoracic;takes Robaxin prn   Peripheral neuropathy    PONV (postoperative nausea and vomiting)    Rosacea    Seasonal allergies    takes allegra prn   Sleep apnea    Past Surgical History:  Procedure Laterality Date   ABDOMINAL HYSTERECTOMY  1980's   ANAL FISSURE REPAIR  ~ 2011   "banded hemorrhoids at this time too"   BACK SURGERY  10/20/2018   lumbar fusion L4-5, L5-S1   BARIATRIC SURGERY     CARDIAC CATHETERIZATION  ~  [place] Left 09/13/2017   & ulnar nerve release   CERVICAL DISC SURGERY  03/2017   C5, C6    COLONOSCOPY     ESOPHAGOGASTRODUODENOSCOPY     KNEE ARTHROPLASTY Left 2007   KNEE ARTHROSCOPY Right    KNEE ARTHROSCOPY Right    "torn meniscus"   KNEE SURGERY Left 1982-2013   "17 before 10/31/2013"   LEFT OOPHORECTOMY  1980's   PATELLECTOMY Right    PLANTAR FASCIA SURGERY Bilateral 1990's   REVISION TOTAL KNEE ARTHROPLASTY Left 11/01/2011   TOTAL KNEE ARTHROPLASTY Right 10/15/2013   Procedure: RIGHT TOTAL KNEE ARTHROPLASTY;  Surgeon: Garald Balding, MD;  Location: [place];  Service: Orthopedics;  Laterality: Right;   TOTAL KNEE REVISION  11/01/2011   Procedure: TOTAL KNEE REVISION;  Surgeon: Garald Balding, MD;   Location: [facility];  Service: Orthopedics;  Laterality: Left;  Revision Left Total Knee Replacement    TOTAL KNEE REVISION Right 09/25/2020   Procedure: RIGHT POLY LINER EXCHANGE;  Surgeon: Mcarthur Rossetti, MD;  Location: [facility];  Service: Orthopedics;  Laterality: Right;   TUBAL LIGATION  1981   Patient Active Problem List   Diagnosis Date Noted   Status post revision of total knee, right 11/05/2020   Status post revision of total replacement of right knee 09/25/2020   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 ([place]) 09/24/2020   Pain in right hip 07/08/2020   Painful total knee replacement, right ([place]) 06/18/2020   Pain in right knee 05/07/2020   Knee pain, bilateral 04/21/2020   Spondylolisthesis of lumbar region 10/15/2018   Early onset dysthymia 02/12/2018   Obsessive-compulsive disorder 10/10/2017   Generalized anxiety disorder 10/10/2017   Major depressive disorder, recurrent episode, moderate ([place]) 10/10/2017   Hiatal hernia 03/03/2015   Steatosis of liver 09/16/2014   Obstructive sleep apnea syndrome 08/27/2014   Pre-op evaluation 05/14/2014   Osteoarthritis of left knee 10/17/2013   History of left knee replacement 10/15/2013   Hypercholesterolemia 08/05/2013   Palpitations 08/05/2013   HTN (hypertension) 08/05/2013   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 08/05/2013   Preoperative cardiovascular examination 08/05/2013   Degenerative arthritis of knee, bilateral 08/05/2013   Chronic pain of left knee 11/03/2011   Morbid obesity ([place]) 11/03/2011   Hypothyroid 11/03/2011    PCP: Shirline Frees, MD  REFERRING PROVIDER: Mcarthur Rossetti* Knees Eichman MD (Back)  REFERRING DIAG:  [phone] (ICD-10-CM) - 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 She relays she did home PT after her latest knee revision and has been doing exercises but feels she needs to get her legs stronger, help with her balance, and that her back MD is requesting DN for management of her low back pain  PERTINENT HISTORY: PMH:anx,chronic back pain,dep,Rt TKA 2015 and revision 2022, Lt TKA revision 2013, lumbar fusion surgery 2020 L4-S1  PAIN:  Are you having pain? Yes NPRS scale: 1/10 in Right knee and 4/10 in left knee, 6/10 low back pain and into buttocks Pain location:  anterior and lateral Pain orientation: Bilateral  PAIN TYPE: achy and constant, numbness Pain description: constant  Aggravating factors: sitting walking or standing more than 1 minute Relieving factors: heat, ice, tylenol  PRECAUTIONS: None  WEIGHT BEARING RESTRICTIONS No  FALLS:  Has patient fallen in last 6 months? No, Number of falls: 0 but is fearful of falling  LIVING ENVIRONMENT: Lives with: lives with their family No stairs  OCCUPATION: retired  PLOF: Independent  PATIENT GOALS: stand longer and be more active, gain strength in legs   OBJECTIVE:   DIAGNOSTIC FINDINGS: "X-rays of both knees show well-seated knee replacements.  The left knee is a revision arthroplasty and the components appear well-seated with a large cement mantle and good space in the knee."  PATIENT SURVEYS:  FOTO 56% functional score, goal is 60%  COGNITION:  Overall cognitive status: Within functional limits for tasks assessed     SENSATION:  Light touch: Appears intact    MUSCLE LENGTH: Hamstrings: Right 70 deg; Left 40 deg with pain on Left  POSTURE:  Slumped  posture  PALPATION: TTP in lumbar spine around sacral segements  LE AROM/PROM:  A/PROM Right 03/10/2021 Left 03/10/2021  Hip flexion    Hip extension    Hip abduction    Hip adduction    Hip internal rotation    Hip external rotation    Knee flexion AROM 120 AROM  116  Knee extension 0 0  Ankle dorsiflexion    Ankle plantarflexion    Ankle inversion    Ankle eversion     (Blank rows = not tested)  Lumbar AROM: 03/10/21 Flexion 30 deg  Extension 10 deg,  lateral flexion 10 deg to Rt and 5 deg to left  rotation 25% bilat  LE MMT: (tested in sitting)  MMT Right 03/10/2021 Left 03/10/2021  Hip flexion 4+ 4  Hip extension 4+ 4+  Hip abduction 4 4  Hip adduction 4+ 4+  Hip internal rotation    Hip external rotation    Knee flexion 5 5  Knee extension 4+ 4  Ankle dorsiflexion    Ankle plantarflexion    Ankle inversion    Ankle eversion     (Blank rows = not tested)  Lumbar SPECIAL TESTS: negative slump test bilat but + SLR on left that was negative on Rt, + quadrant testing bilat   FUNCTIONAL TESTS:  Difficulty with supine<>sit transfers, and sit to stands  GAIT: Distance walked:150 feet limited community ambulator with slower velocity and wider BOS    TODAY'S TREATMENT: Manual therapy: active compression and skilled palpation with DN to lumbar-sacral paraspinals and glutes in sidelying. Good twitch response noted and good overall tolerance.   PATIENT EDUCATION:  Education details: HEP,POC,DN Person educated: Patient Education method: Explanation, Demonstration, Verbal cues, and Handouts were sent via text Education comprehension: verbalized understanding and needs further education   HOME EXERCISE PROGRAM: Access Code: [email or web address] URL: [email or web address] Date: 03/10/2021 Prepared by: Elsie Ra  Exercises Standing Lumbar Extension at [place] - 2 x daily - 6 x weekly - 2-3 sets - 10 reps Supine Lower Trunk Rotation - 2 x daily - 6  x weekly - 1 sets - 10 reps - 5 sec hold Supine Bridge - 2 x daily - 6 x weekly - 1-2 sets - 10 reps - 5 hold Seated Small Alternating Straight Leg Lifts - 2 x daily - 6 x weekly - 1-2 sets - 15 reps Mini Squat with Counter Support - 2 x  daily - 6 x weekly - 1-2 sets - 10 reps   ASSESSMENT:  CLINICAL IMPRESSION: Patient presents with signs and symptoms consistent with Chronic bilat knee pain and weakness S/P bilat TKA with revisions (latest 09/2020) and also chronic low back pain with difficulty standing and walking S/P L4-S1 fusion 2021). Patient will benefit from skilled PT to address below impairments and improve overall function.  OBJECTIVE IMPAIRMENTS: decreased activity tolerance, difficulty walking, decreased balance, decreased endurance, decreased mobility, decreased ROM, decreased strength, impaired flexibility, impaired LE use, postural dysfunction, and pain.  ACTIVITY LIMITATIONS: bending, lifting, carry, locomotion, cleaning, community activity, driving  PERSONAL FACTORS: PMH:anx,chronic back pain,dep,Rt TKA 2015 and revision 2022, Lt TKA revision 2013, lumbar fusion surgery 2020 L4-S1 are also affecting patient's functional outcome.   REHAB POTENTIAL: Good  CLINICAL DECISION MAKING:  evolving/moderate  EVALUATION COMPLEXITY: moderate    GOALS: Short term PT Goals (target date for Short term goals are 4 weeks 04/07/21) Pt will be I and compliant with HEP. Baseline:  Goal status: New Pt will decrease pain by 25% overall with standing >43mn Baseline: Goal status: New  Long term PT goals (target dates for all long term goals are 8 weeks 05/05/21) Pt will [facility] mobility Baseline: Goal status: New Pt will improve  hip/knee strength to at least 5-/5 MMT to improve functional strength Baseline: Goal status: New Pt will improve FOTO to at least 60% functional to show improved function Baseline: Goal status: New Pt will reduce pain  by overall 50% overall with standing walking activity >10 min Baseline:9-10 Goal status: New  PLAN: PT FREQUENCY: 1-2 times per week   PT DURATION: 6-8 weeks  PLANNED INTERVENTIONS (unless contraindicated): aquatic PT, Canalith repositioning, cryotherapy, Electrical stimulation, Iontophoresis with 4 mg/ml dexamethasome, Moist heat, traction, Ultrasound, gait training, Therapeutic exercise, balance training, neuromuscular re-education, patient/family education, prosthetic training, manual techniques, passive ROM, dry needling, taping, vasopnuematic device, vestibular, spinal manipulations, joint manipulations  PLAN FOR NEXT SESSION: MD is recommending DN so how was her response from last time, did she get HEP that was texted to her?    BDebbe Odea PT,DPT 03/10/2021, 4:55 PM

## 2021-03-10 ENCOUNTER — Ambulatory Visit: Payer: Medicare PPO | Admitting: Physical Therapy

## 2021-03-10 ENCOUNTER — Other Ambulatory Visit: Payer: Self-pay

## 2021-03-10 ENCOUNTER — Encounter: Payer: Self-pay | Admitting: Physical Therapy

## 2021-03-10 DIAGNOSIS — M545 Low back pain, unspecified: Secondary | ICD-10-CM | POA: Diagnosis not present

## 2021-03-10 DIAGNOSIS — G8929 Other chronic pain: Secondary | ICD-10-CM

## 2021-03-10 DIAGNOSIS — R262 Difficulty in walking, not elsewhere classified: Secondary | ICD-10-CM | POA: Diagnosis not present

## 2021-03-10 DIAGNOSIS — M6281 Muscle weakness (generalized): Secondary | ICD-10-CM | POA: Diagnosis not present

## 2021-03-10 DIAGNOSIS — M25562 Pain in left knee: Secondary | ICD-10-CM | POA: Diagnosis not present

## 2021-03-10 DIAGNOSIS — M25561 Pain in right knee: Secondary | ICD-10-CM

## 2021-03-19 ENCOUNTER — Encounter: Payer: Medicare PPO | Admitting: Physical Therapy

## 2021-03-23 ENCOUNTER — Other Ambulatory Visit: Payer: Self-pay

## 2021-03-23 ENCOUNTER — Encounter: Payer: Self-pay | Admitting: Physical Therapy

## 2021-03-23 ENCOUNTER — Ambulatory Visit: Payer: Medicare PPO | Admitting: Physical Therapy

## 2021-03-23 DIAGNOSIS — M25562 Pain in left knee: Secondary | ICD-10-CM

## 2021-03-23 DIAGNOSIS — R262 Difficulty in walking, not elsewhere classified: Secondary | ICD-10-CM | POA: Diagnosis not present

## 2021-03-23 DIAGNOSIS — G8929 Other chronic pain: Secondary | ICD-10-CM | POA: Diagnosis not present

## 2021-03-23 DIAGNOSIS — M25561 Pain in right knee: Secondary | ICD-10-CM | POA: Diagnosis not present

## 2021-03-23 DIAGNOSIS — M545 Low back pain, unspecified: Secondary | ICD-10-CM

## 2021-03-23 DIAGNOSIS — M6281 Muscle weakness (generalized): Secondary | ICD-10-CM | POA: Diagnosis not present

## 2021-03-23 NOTE — Therapy (Signed)
?OUTPATIENT PHYSICAL THERAPY TREATMENT NOTE ? ? ?Patient Name: [name] ?MRN: [id number] ?DOB:[birth year], 66 y.o., female ?Today's Date: 03/23/2021 ? ?PCP: Shirline Frees, MD ?REFERRING PROVIDER: Mcarthur Rossetti* ? ? PT End of Session - 03/23/21 1019   ? ? Visit Number 2   ? Number of Visits 15   ? Date for PT Re-Evaluation 05/05/21   ? Authorization Type [employer]   ? PT Start Time 0930   ? PT Stop Time 1015   ? PT Time Calculation (min) 45 min   ? Activity Tolerance Patient tolerated treatment well   ? Behavior During Therapy [facility] for tasks assessed/performed   ? ?  ?  ? ?  ? ? ?Past Medical History:  ?Diagnosis Date  ? Anginal pain ([place])   ? "on and off" (11/01/2011) - anxiety related  ? Anxiety   ? takes Paxil daily and Xanax prn  ? Arthritis   ? "knees" (10/16/2013)  ? Chronic back pain   ? facet disease and bulging disc; "thoracic and lower back" (10/16/2013)  ? Constipation   ? r/t pain meds;takes stool softener daily  ? Depression   ? Dysrhythmia   ? rapid HR on occasion-takes Propranolol daily  ? Family history of anesthesia complication   ? ":PONV; mom and sisters"  ? History of bladder infections   ? > 69yrago  ? History of kidney stones   ? Hyperlipidemia   ? takes LIpitor daily  ? Hypothyroidism   ? takes Synthroid daily  ? Insomnia   ? Joint pain   ? Joint swelling   ? Muscle spasms of head or neck   ? lumbar and thoracic;takes Robaxin prn  ? Peripheral neuropathy   ? PONV (postoperative nausea and vomiting)   ? Rosacea   ? Seasonal allergies   ? takes allegra prn  ? Sleep apnea   ? ?Past Surgical History:  ?Procedure Laterality Date  ? ABDOMINAL HYSTERECTOMY  1980's  ? ANAL FISSURE REPAIR  ~ 2011  ? "banded hemorrhoids at this time too"  ? BACK SURGERY  10/20/2018  ? lumbar fusion L4-5, L5-S1  ? BARIATRIC SURGERY    ? CARDIAC CATHETERIZATION  ~ 1999  ? CARPAL TUNNEL RELEASE Left 09/13/2017  ? & ulnar nerve release  ? CERVICAL DISC SURGERY  03/2017  ? C5, C6   ? COLONOSCOPY    ?  ESOPHAGOGASTRODUODENOSCOPY    ? KNEE ARTHROPLASTY Left 2007  ? KNEE ARTHROSCOPY Right   ? KNEE ARTHROSCOPY Right   ? "torn meniscus"  ? KNEE SURGERY Left 1982-2013  ? "17 before 10/31/2013"  ? LEFT OOPHORECTOMY  1980's  ? PATELLECTOMY Right   ? PLANTAR FASCIA SURGERY Bilateral 1990's  ? REVISION TOTAL KNEE ARTHROPLASTY Left 11/01/2011  ? TOTAL KNEE ARTHROPLASTY Right 10/15/2013  ? Procedure: RIGHT TOTAL KNEE ARTHROPLASTY;  Surgeon: PGarald Balding MD;  Location[place]  Service: Orthopedics;  Laterality: Right;  ? TOTAL KNEE REVISION  11/01/2011  ? Procedure: TOTAL KNEE REVISION;  Surgeon: PGarald Balding MD;  [place]  Service: Orthopedics;  Laterality: Left;  Revision Left Total Knee Replacement   ? TOTAL KNEE REVISION Right 09/25/2020  ? Procedure: RIGHT POLY LINER EXCHANGE;  Surgeon: BMcarthur Rossetti MD;  Location[facility];  Service: Orthopedics;  Laterality: Right;  ? TUBAL LIGATION  1981  ? ?Patient Active Problem List  ? Diagnosis Date Noted  ? Status post revision of total knee, right 11/05/2020  ? Status post  revision of total replacement of right knee 09/25/2020  ? 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 Pain in right hip 07/08/2020  ? Painful total knee replacement, right ([place]) 06/18/2020  ? Pain in right knee 05/07/2020  ? Knee pain, bilateral 04/21/2020  ? Spondylolisthesis of lumbar region 10/15/2018  ? Early onset dysthymia 02/12/2018  ? Obsessive-compulsive disorder 10/10/2017  ? Generalized anxiety disorder 10/10/2017  ? Major depressive disorder, recurrent episode, moderate ([place]) 10/10/2017  ? Hiatal hernia 03/03/2015  ? Steatosis of liver 09/16/2014  ? Obstructive sleep apnea syndrome 08/27/2014  ? Pre-op evaluation 05/14/2014  ? Osteoarthritis of left knee 10/17/2013  ? History of left knee replacement 10/15/2013  ? Hypercholesterolemia 08/05/2013  ? Palpitations 08/05/2013  ? HTN (hypertension) 08/05/2013  ? 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 Preoperative cardiovascular examination 08/05/2013  ? Degenerative arthritis of knee, bilateral 08/05/2013  ? Chronic pain of left knee 11/03/2011  ? Morbid obesity ([place]) 11/03/2011  ? Hypothyroid 11/03/2011  ? ? ?THERAPY DIAG:  ?Chronic pain of right knee ? ?Chronic pain of left knee ? ?Muscle weakness (generalized) ? ?Chronic bilateral low back pain, unspecified whether sciatica present ? ?Difficulty in walking, not elsewhere classified ? ?PCP: Shirline Frees, MD ?  ?REFERRING PROVIDER: Mcarthur Rossetti* Knees ?Eichman MD (Back) ?  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  ?  ?ONSET DATE: Rt TKA revision 09/2020, Lumbar fusion 2020 ?PERTINENT HISTORY: ?PMH:anx,chronic back pain,dep,Rt TKA 2015 and revision 2022, Lt TKA revision 2013, lumbar fusion surgery 2020 L4-S1 ?  ?SUBJECTIVE:  ?  ?SUBJECTIVE STATEMENT: ?She relays the mini squats are aggravating her Rt knee, all the other exercises are fine. She is unsure if the DN helped her back any but is willing to try this again. ?  ?PAIN:  ?Are you having pain? Yes ?NPRS scale: no pain in left knee, 2-4 in Rt knee, 5/10 low back pain and into buttocks ?Pain location: anterior and lateral ?Pain orientation: Bilateral  ?PAIN TYPE: achy and constant, numbness ?Pain description: constant  ?Aggravating factors: sitting walking or standing more than 1 minute ?Relieving factors: heat, ice, tylenol ?  ?PRECAUTIONS: None ?  ?WEIGHT BEARING RESTRICTIONS No ?  ?FALLS:  ?Has patient fallen in last 6 months? No, Number of falls: 0 but is fearful of falling ?  ?OCCUPATION: retired ?  ?PATIENT GOALS: stand longer and be more active, gain strength in legs ?  ?  ?OBJECTIVE:  ?  ?DIAGNOSTIC FINDINGS: "X-rays of both knees show well-seated knee replacements.  The left knee is a  revision arthroplasty and the components appear well-seated with a large cement mantle and good space in the knee." ?  ?PATIENT SURVEYS:  ?FOTO 56% functional score, goal is 60% ?  ?MUSCLE LENGTH: ?Hamstrings: Right 70 deg; Left 40 deg with pain on Left ?  ?POSTURE:  ?Slumped posture ?  ?PALPATION: ?TTP in lumbar spine around sacral segements ?  ?LE AROM/PROM: ?  ?A/PROM Right ?03/10/2021 Left ?03/10/2021  ?Hip flexion      ?Hip extension      ?Hip abduction      ?Hip adduction      ?Hip internal rotation      ?Hip external rotation      ?Knee flexion AROM 120 AROM  ?116  ?Knee extension 0 0  ?Ankle  dorsiflexion      ?Ankle plantarflexion      ?Ankle inversion      ?Ankle eversion      ? (Blank rows = not tested) ?  ?Lumbar AROM: 03/10/21 ?Flexion 30 deg  ?Extension 10 deg,  ?lateral flexion 10 deg to Rt and 5 deg to left  ?rotation 25% bilat ?  ?LE MMT: (tested in sitting) ?  ?MMT Right ?03/10/2021 Left ?03/10/2021  ?Hip flexion 4+ 4  ?Hip extension 4+ 4+  ?Hip abduction 4 4  ?Hip adduction 4+ 4+  ?Hip internal rotation      ?Hip external rotation      ?Knee flexion 5 5  ?Knee extension 4+ 4  ?Ankle dorsiflexion      ?Ankle plantarflexion      ?Ankle inversion      ?Ankle eversion      ? (Blank rows = not tested) ?  ?Lumbar SPECIAL TESTS: negative slump test bilat but + SLR on left that was negative on Rt, + quadrant testing bilat ?  ?  ?FUNCTIONAL TESTS:  ?Difficulty with supine<>sit transfers, and sit to stands ?  ?GAIT: ?Distance walked:150 feet limited community ambulator with slower velocity and wider BOS ?  ?  ?  ?TODAY'S TREATMENT: ? ?03/23/21 ?Therapeutic Exercise: ? Aerobic: ?Supine:LTR 5 sec X 10 bilat, [place] 2X10 with cues for smaller pain free ROM ?Prone: ? Seated:LAQ X 15 bilat 2#, Seated SLR X 15 bilat (cues for one side at a time vs alternating due to difficulty) ? Standing:Lumbar extensions at wall X 15 ?Neuromuscular Re-education: ?Manual Therapy: active compression and skilled palpation with DN to  lumbar-sacral paraspinals and glutes in Prone. Combined with Estim today. Good twitch response noted and good overall tolerance.  ?Therapeutic Activity: ?Self Care:  ?Modalities:  ? ?03/10/21: Manual therapy: active compressi

## 2021-03-24 ENCOUNTER — Ambulatory Visit: Payer: Medicare PPO | Admitting: Orthopaedic Surgery

## 2021-03-24 DIAGNOSIS — J309 Allergic rhinitis, unspecified: Secondary | ICD-10-CM | POA: Diagnosis not present

## 2021-03-24 DIAGNOSIS — R7303 Prediabetes: Secondary | ICD-10-CM | POA: Diagnosis not present

## 2021-03-24 DIAGNOSIS — E7800 Pure hypercholesterolemia, unspecified: Secondary | ICD-10-CM | POA: Diagnosis not present

## 2021-03-24 DIAGNOSIS — R002 Palpitations: Secondary | ICD-10-CM | POA: Diagnosis not present

## 2021-03-24 DIAGNOSIS — M797 Fibromyalgia: Secondary | ICD-10-CM | POA: Diagnosis not present

## 2021-03-24 DIAGNOSIS — F419 Anxiety disorder, unspecified: Secondary | ICD-10-CM | POA: Diagnosis not present

## 2021-03-24 DIAGNOSIS — G2581 Restless legs syndrome: Secondary | ICD-10-CM | POA: Diagnosis not present

## 2021-03-24 DIAGNOSIS — M8949 Other hypertrophic osteoarthropathy, multiple sites: Secondary | ICD-10-CM | POA: Diagnosis not present

## 2021-03-24 DIAGNOSIS — G8929 Other chronic pain: Secondary | ICD-10-CM | POA: Diagnosis not present

## 2021-03-26 ENCOUNTER — Other Ambulatory Visit: Payer: Self-pay

## 2021-03-26 ENCOUNTER — Encounter: Payer: Self-pay | Admitting: Physical Therapy

## 2021-03-26 ENCOUNTER — Ambulatory Visit (INDEPENDENT_AMBULATORY_CARE_PROVIDER_SITE_OTHER): Payer: Medicare PPO | Admitting: Physical Therapy

## 2021-03-26 DIAGNOSIS — M25561 Pain in right knee: Secondary | ICD-10-CM

## 2021-03-26 DIAGNOSIS — M6281 Muscle weakness (generalized): Secondary | ICD-10-CM

## 2021-03-26 DIAGNOSIS — M545 Low back pain, unspecified: Secondary | ICD-10-CM | POA: Diagnosis not present

## 2021-03-26 DIAGNOSIS — G8929 Other chronic pain: Secondary | ICD-10-CM | POA: Diagnosis not present

## 2021-03-26 DIAGNOSIS — M25562 Pain in left knee: Secondary | ICD-10-CM

## 2021-03-26 DIAGNOSIS — R262 Difficulty in walking, not elsewhere classified: Secondary | ICD-10-CM | POA: Diagnosis not present

## 2021-03-26 NOTE — Therapy (Signed)
?OUTPATIENT PHYSICAL THERAPY TREATMENT NOTE ? ? ?Patient Name: [name] ?MRN: [id number] ?DOB:[birth year], 66 y.o., female ?Today's Date: 03/26/2021 ? ?PCP: Shirline Frees, MD ?REFERRING PROVIDER: Mcarthur Rossetti* ? ? PT End of Session - 03/26/21 1013   ? ? Visit Number 3   ? Number of Visits 15   ? Date for PT Re-Evaluation 05/05/21   ? Authorization Type [employer]   ? PT Start Time 1014   ? PT Stop Time 1055   ? PT Time Calculation (min) 41 min   ? Activity Tolerance Patient tolerated treatment well   ? Behavior During Therapy [facility] for tasks assessed/performed   ? ?  ?  ? ?  ? ? ?Past Medical History:  ?Diagnosis Date  ? Anginal pain ([place])   ? "on and off" (11/01/2011) - anxiety related  ? Anxiety   ? takes Paxil daily and Xanax prn  ? Arthritis   ? "knees" (10/16/2013)  ? Chronic back pain   ? facet disease and bulging disc; "thoracic and lower back" (10/16/2013)  ? Constipation   ? r/t pain meds;takes stool softener daily  ? Depression   ? Dysrhythmia   ? rapid HR on occasion-takes Propranolol daily  ? Family history of anesthesia complication   ? ":PONV; mom and sisters"  ? History of bladder infections   ? > 77yrago  ? History of kidney stones   ? Hyperlipidemia   ? takes LIpitor daily  ? Hypothyroidism   ? takes Synthroid daily  ? Insomnia   ? Joint pain   ? Joint swelling   ? Muscle spasms of head or neck   ? lumbar and thoracic;takes Robaxin prn  ? Peripheral neuropathy   ? PONV (postoperative nausea and vomiting)   ? Rosacea   ? Seasonal allergies   ? takes allegra prn  ? Sleep apnea   ? ?Past Surgical History:  ?Procedure Laterality Date  ? ABDOMINAL HYSTERECTOMY  1980's  ? ANAL FISSURE REPAIR  ~ 2011  ? "banded hemorrhoids at this time too"  ? BACK SURGERY  10/20/2018  ? lumbar fusion L4-5, L5-S1  ? BARIATRIC SURGERY    ? CARDIAC CATHETERIZATION  ~ 1999  ? CARPAL TUNNEL RELEASE Left 09/13/2017  ? & ulnar nerve release  ? CERVICAL DISC SURGERY  03/2017  ? C5, C6   ? COLONOSCOPY    ?  ESOPHAGOGASTRODUODENOSCOPY    ? KNEE ARTHROPLASTY Left 2007  ? KNEE ARTHROSCOPY Right   ? KNEE ARTHROSCOPY Right   ? "torn meniscus"  ? KNEE SURGERY Left 1982-2013  ? "17 before 10/31/2013"  ? LEFT OOPHORECTOMY  1980's  ? PATELLECTOMY Right   ? PLANTAR FASCIA SURGERY Bilateral 1990's  ? REVISION TOTAL KNEE ARTHROPLASTY Left 11/01/2011  ? TOTAL KNEE ARTHROPLASTY Right 10/15/2013  ? Procedure: RIGHT TOTAL KNEE ARTHROPLASTY;  Surgeon: PGarald Balding MD;  Location: [facility]  Service: Orthopedics;  Laterality: Right;  ? TOTAL KNEE REVISION  11/01/2011  ? Procedure: TOTAL KNEE REVISION;  Surgeon: PGarald Balding MD;  Location: [facility]  Service: Orthopedics;  Laterality: Left;  Revision Left Total Knee Replacement   ? TOTAL KNEE REVISION Right 09/25/2020  ? Procedure: RIGHT POLY LINER EXCHANGE;  Surgeon: BMcarthur Rossetti MD;  Location: [facility];  Service: Orthopedics;  Laterality: Right;  ? TUBAL LIGATION  1981  ? ?Patient Active Problem List  ? Diagnosis Date Noted  ? Status post revision of total knee, right 11/05/2020  ? Status post  revision of total replacement of right knee 09/25/2020  ? 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 Pain in right hip 07/08/2020  ? Painful total knee replacement, right ([place]) 06/18/2020  ? Pain in right knee 05/07/2020  ? Knee pain, bilateral 04/21/2020  ? Spondylolisthesis of lumbar region 10/15/2018  ? Early onset dysthymia 02/12/2018  ? Obsessive-compulsive disorder 10/10/2017  ? Generalized anxiety disorder 10/10/2017  ? Major depressive disorder, recurrent episode, moderate ([place]) 10/10/2017  ? Hiatal hernia 03/03/2015  ? Steatosis of liver 09/16/2014  ? Obstructive sleep apnea syndrome 08/27/2014  ? Pre-op evaluation 05/14/2014  ? Osteoarthritis of left knee 10/17/2013  ? History of left knee replacement 10/15/2013  ? Hypercholesterolemia 08/05/2013  ? Palpitations 08/05/2013  ? HTN (hypertension) 08/05/2013  ? 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 Preoperative cardiovascular examination 08/05/2013  ? Degenerative arthritis of knee, bilateral 08/05/2013  ? Chronic pain of left knee 11/03/2011  ? Morbid obesity ([place]) 11/03/2011  ? Hypothyroid 11/03/2011  ? ? ?THERAPY DIAG:  ?Chronic pain of right knee ? ?Chronic pain of left knee ? ?Muscle weakness (generalized) ? ?Chronic bilateral low back pain, unspecified whether sciatica present ? ?Difficulty in walking, not elsewhere classified ? ?PCP: Shirline Frees, MD ?  ?REFERRING PROVIDER: Mcarthur Rossetti* Knees ?Eichman MD (Back) ?  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  ?  ?ONSET DATE: Rt TKA revision 09/2020, Lumbar fusion 2020 ?PERTINENT HISTORY: ?PMH:anx,chronic back pain,dep,Rt TKA 2015 and revision 2022, Lt TKA revision 2013, lumbar fusion surgery 2020 L4-S1 ?  ?SUBJECTIVE:  ?  ?SUBJECTIVE STATEMENT: ?She relays she felt good after the DN last time that day and the day after but now the pain is back so she wants to defer DN today. ?  ?PAIN:  ?Are you having pain? Yes ?NPRS scale: 1/0 knee pain bilat , 5/10 low back pain and into buttocks ?Pain orientation: Bilateral  ?PAIN TYPE: achy and constant, numbness ?Pain description: constant  ?Aggravating factors: sitting walking or standing more than 1 minute ?Relieving factors: heat, ice, tylenol ?  ?PRECAUTIONS: None ?  ?WEIGHT BEARING RESTRICTIONS No ?  ?FALLS:  ?Has patient fallen in last 6 months? No, Number of falls: 0 but is fearful of falling ?  ?OCCUPATION: retired ?  ?PATIENT GOALS: stand longer and be more active, gain strength in legs ?  ?  ?OBJECTIVE:  ?  ?DIAGNOSTIC FINDINGS: "X-rays of both knees show well-seated knee replacements.  The left knee is a revision arthroplasty and the components appear well-seated with a large cement mantle and good  space in the knee." ?  ?PATIENT SURVEYS:  ?FOTO 56% functional score, goal is 60% ?  ?MUSCLE LENGTH: ?Hamstrings: Right 70 deg; Left 40 deg with pain on Left ?  ?POSTURE:  ?Slumped posture ?  ?PALPATION: ?TTP in lumbar spine around sacral segements ?  ?LE AROM/PROM: ?  ?A/PROM Right ?03/10/2021 Left ?03/10/2021  ?Hip flexion      ?Hip extension      ?Hip abduction      ?Hip adduction      ?Hip internal rotation      ?Hip external rotation      ?Knee flexion AROM 120 AROM  ?116  ?Knee extension 0 0  ?Ankle dorsiflexion      ?Ankle plantarflexion      ?  Ankle inversion      ?Ankle eversion      ? (Blank rows = not tested) ?  ?Lumbar AROM: 03/10/21 ?Flexion 30 deg  ?Extension 10 deg,  ?lateral flexion 10 deg to Rt and 5 deg to left  ?rotation 25% bilat ?  ?LE MMT: (tested in sitting) ?  ?MMT Right ?03/10/2021 Left ?03/10/2021  ?Hip flexion 4+ 4  ?Hip extension 4+ 4+  ?Hip abduction 4 4  ?Hip adduction 4+ 4+  ?Hip internal rotation      ?Hip external rotation      ?Knee flexion 5 5  ?Knee extension 4+ 4  ?Ankle dorsiflexion      ?Ankle plantarflexion      ?Ankle inversion      ?Ankle eversion      ? (Blank rows = not tested) ?  ?Lumbar SPECIAL TESTS: negative slump test bilat but + SLR on left that was negative on Rt, + quadrant testing bilat ?  ?  ?FUNCTIONAL TESTS:  ?Difficulty with supine<>sit transfers, and sit to stands ?  ?GAIT: ?Distance walked:150 feet limited community ambulator with slower velocity and wider BOS ?  ?  ?TODAY'S TREATMENT: ?03/26/21 ?Therapeutic Exercise: ?Aerobic: Nu step seat 8 for 8 min L5 UE/LE ?Supine: ?Prone: ?Seated: Leg press machine 75# 2X10 DL then 31# X 15 SL bilat. Seated back extensions holding 10# KB 2x10. Seated SLR 2# 2X10 bilat, seated LAQ 2# 2X10 bilat. ?Standing: shoulder rows and extensions with TAC Green 2X10 bilat. Attempted standing hip hinge/deadlift with green band but too difficult so modified to seated back extensions. Suitcase cary 5# X 75 feet each side. ?Neuromuscular  Re-education: ?Manual Therapy: ?Therapeutic Activity: ?Modalities:  ? ?03/23/21 ?Therapeutic Exercise: ? Aerobic: ?Supine:LTR 5 sec X 10 bilat, [place] 2X10 with cues for smaller pain free ROM ?Prone: ? Seated:LAQ X 15

## 2021-03-30 ENCOUNTER — Other Ambulatory Visit: Payer: Self-pay

## 2021-03-30 ENCOUNTER — Encounter: Payer: Self-pay | Admitting: Physical Therapy

## 2021-03-30 ENCOUNTER — Ambulatory Visit: Payer: Medicare PPO | Admitting: Physical Therapy

## 2021-03-30 DIAGNOSIS — G8929 Other chronic pain: Secondary | ICD-10-CM | POA: Diagnosis not present

## 2021-03-30 DIAGNOSIS — M25561 Pain in right knee: Secondary | ICD-10-CM

## 2021-03-30 DIAGNOSIS — M6281 Muscle weakness (generalized): Secondary | ICD-10-CM | POA: Diagnosis not present

## 2021-03-30 DIAGNOSIS — M25562 Pain in left knee: Secondary | ICD-10-CM | POA: Diagnosis not present

## 2021-03-30 DIAGNOSIS — M545 Low back pain, unspecified: Secondary | ICD-10-CM

## 2021-03-30 DIAGNOSIS — R262 Difficulty in walking, not elsewhere classified: Secondary | ICD-10-CM | POA: Diagnosis not present

## 2021-03-30 NOTE — Therapy (Signed)
?OUTPATIENT PHYSICAL THERAPY TREATMENT NOTE ? ? ?Patient Name: [name] ?MRN: [id number] ?DOB:08-20-1955, 66 y.o., female ?Today's Date: 03/30/2021 ? ?PCP: Shirline Frees, MD ?REFERRING PROVIDER: Mcarthur Rossetti* ? ? PT End of Session - 03/30/21 1440   ? ? Visit Number 4   ? Number of Visits 15   ? Date for PT Re-Evaluation 05/05/21   ? Authorization Type [employer]   ? PT Start Time 4627   ? PT Stop Time 1525   ? PT Time Calculation (min) 54 min   ? Activity Tolerance Patient tolerated treatment well   ? Behavior During Therapy [facility] for tasks assessed/performed   ? ?  ?  ? ?  ? ? ?Past Medical History:  ?Diagnosis Date  ? Anginal pain ([place])   ? "on and off" (11/01/2011) - anxiety related  ? Anxiety   ? takes Paxil daily and Xanax prn  ? Arthritis   ? "knees" (10/16/2013)  ? Chronic back pain   ? facet disease and bulging disc; "thoracic and lower back" (10/16/2013)  ? Constipation   ? r/t pain meds;takes stool softener daily  ? Depression   ? Dysrhythmia   ? rapid HR on occasion-takes Propranolol daily  ? Family history of anesthesia complication   ? ":PONV; mom and sisters"  ? History of bladder infections   ? > 56yrago  ? History of kidney stones   ? Hyperlipidemia   ? takes LIpitor daily  ? Hypothyroidism   ? takes Synthroid daily  ? Insomnia   ? Joint pain   ? Joint swelling   ? Muscle spasms of head or neck   ? lumbar and thoracic;takes Robaxin prn  ? Peripheral neuropathy   ? PONV (postoperative nausea and vomiting)   ? Rosacea   ? Seasonal allergies   ? takes allegra prn  ? Sleep apnea   ? ?Past Surgical History:  ?Procedure Laterality Date  ? ABDOMINAL HYSTERECTOMY  1980's  ? ANAL FISSURE REPAIR  ~ 2011  ? "banded hemorrhoids at this time too"  ? BACK SURGERY  10/20/2018  ? lumbar fusion L4-5, L5-S1  ? BARIATRIC SURGERY    ? CARDIAC CATHETERIZATION  ~ 1999  ? CARPAL TUNNEL RELEASE Left 09/13/2017  ? & ulnar nerve release  ? CERVICAL DISC SURGERY  03/2017  ? C5, C6   ? COLONOSCOPY    ?  ESOPHAGOGASTRODUODENOSCOPY    ? KNEE ARTHROPLASTY Left 2007  ? KNEE ARTHROSCOPY Right   ? KNEE ARTHROSCOPY Right   ? "torn meniscus"  ? KNEE SURGERY Left 1982-2013  ? "17 before 10/31/2013"  ? LEFT OOPHORECTOMY  1980's  ? PATELLECTOMY Right   ? PLANTAR FASCIA SURGERY Bilateral 1990's  ? REVISION TOTAL KNEE ARTHROPLASTY Left 11/01/2011  ? TOTAL KNEE ARTHROPLASTY Right 10/15/2013  ? Procedure: RIGHT TOTAL KNEE ARTHROPLASTY;  Surgeon: PGarald Balding MD;  [facility]  Service: Orthopedics;  Laterality: Right;  ? TOTAL KNEE REVISION  11/01/2011  ? Procedure: TOTAL KNEE REVISION;  Surgeon: PGarald Balding MD;  Location[facility]  Service: Orthopedics;  Laterality: Left;  Revision Left Total Knee Replacement   ? TOTAL KNEE REVISION Right 09/25/2020  ? Procedure: RIGHT POLY LINER EXCHANGE;  Surgeon: BMcarthur Rossetti MD;  Location[facility];  Service: Orthopedics;  Laterality: Right;  ? TUBAL LIGATION  1981  ? ?Patient Active Problem List  ? Diagnosis Date Noted  ? Status post revision of total knee, right 11/05/2020  ? Status post  revision of total replacement of right knee 09/25/2020  ? 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 Pain in right hip 07/08/2020  ? Painful total knee replacement, right ([place]) 06/18/2020  ? Pain in right knee 05/07/2020  ? Knee pain, bilateral 04/21/2020  ? Spondylolisthesis of lumbar region 10/15/2018  ? Early onset dysthymia 02/12/2018  ? Obsessive-compulsive disorder 10/10/2017  ? Generalized anxiety disorder 10/10/2017  ? Major depressive disorder, recurrent episode, moderate ([place]) 10/10/2017  ? Hiatal hernia 03/03/2015  ? Steatosis of liver 09/16/2014  ? Obstructive sleep apnea syndrome 08/27/2014  ? Pre-op evaluation 05/14/2014  ? Osteoarthritis of left knee 10/17/2013  ? History of left knee replacement 10/15/2013  ? Hypercholesterolemia 08/05/2013  ? Palpitations 08/05/2013  ? HTN (hypertension) 08/05/2013  ? 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 Preoperative cardiovascular examination 08/05/2013  ? Degenerative arthritis of knee, bilateral 08/05/2013  ? Chronic pain of left knee 11/03/2011  ? Morbid obesity ([place]) 11/03/2011  ? Hypothyroid 11/03/2011  ? ? ?THERAPY DIAG:  ?Chronic pain of right knee ? ?Chronic pain of left knee ? ?Muscle weakness (generalized) ? ?Chronic bilateral low back pain, unspecified whether sciatica present ? ?Difficulty in walking, not elsewhere classified ? ?PCP: Shirline Frees, MD ?  ?REFERRING PROVIDER: Mcarthur Rossetti* Knees ?Eichman MD (Back) ?  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  ?  ?ONSET DATE: Rt TKA revision 09/2020, Lumbar fusion 2020 ?PERTINENT HISTORY: ?PMH:anx,chronic back pain,dep,Rt TKA 2015 and revision 2022, Lt TKA revision 2013, lumbar fusion surgery 2020 L4-S1 ?  ?SUBJECTIVE:  ?  ?SUBJECTIVE STATEMENT: ?She relays the pain is overall not good but she feels the back pain has changed some to where it is not sharp pains but is more burning pain.  ?PAIN:  ?Are you having pain? Yes ?NPRS scale: 5/0 knee pain bilat , 5/10 low back pain and into buttocks ?Pain orientation: Bilateral  ?PAIN TYPE: achy and constant, numbness ?Pain description: constant  ?Aggravating factors: sitting walking or standing more than 1 minute ?Relieving factors: heat, ice, tylenol ?  ?PRECAUTIONS: None ?  ?WEIGHT BEARING RESTRICTIONS No ?  ?FALLS:  ?Has patient fallen in last 6 months? No, Number of falls: 0 but is fearful of falling ?  ?OCCUPATION: retired ?  ?PATIENT GOALS: stand longer and be more active, gain strength in legs ?  ?  ?OBJECTIVE:  ?  ?DIAGNOSTIC FINDINGS: "X-rays of both knees show well-seated knee replacements.  The left knee is a revision arthroplasty and the components appear well-seated with a large cement mantle  and good space in the knee." ?  ?PATIENT SURVEYS:  ?FOTO 56% functional score, goal is 60% ?  ?MUSCLE LENGTH: ?Hamstrings: Right 70 deg; Left 40 deg with pain on Left ?  ?POSTURE:  ?Slumped posture ?  ?PALPATION: ?TTP in lumbar spine around sacral segements ?  ?LE AROM/PROM: ?  ?A/PROM Right ?03/10/2021 Left ?03/10/2021  ?Hip flexion      ?Hip extension      ?Hip abduction      ?Hip adduction      ?Hip internal rotation      ?Hip external rotation      ?Knee flexion AROM 120 AROM  ?116  ?Knee extension 0 0  ?Ankle dorsiflexion      ?Ankle plantarflexion      ?Ankle  inversion      ?Ankle eversion      ? (Blank rows = not tested) ?  ?Lumbar AROM: 03/10/21 ?Flexion 30 deg  ?Extension 10 deg,  ?lateral flexion 10 deg to Rt and 5 deg to left  ?rotation 25% bilat ?  ?Lumbar AROM: 03/30/21 ?Flexion 50 deg  ?Extension 20 deg,  ?lateral flexion 15 deg to Rt and 10 deg to left  ?rotation 25% bilat ? ?LE MMT: (tested in sitting) ?  ?MMT Right ?03/10/2021 Left ?03/10/2021 Right ?03/30/21 Left ?03/30/21  ?Hip flexion 4+ 4 4+ 4+  ?Hip extension 4+ 4+    ?Hip abduction 4 4 4+ 4+  ?Hip adduction 4+ 4+    ?Hip internal rotation        ?Hip external rotation        ?Knee flexion '5 5 5 5  '$ ?Knee extension 4+ '4 5 5  '$ ?Ankle dorsiflexion        ?Ankle plantarflexion        ?Ankle inversion        ?Ankle eversion        ? (Blank rows = not tested) ?  ?Lumbar SPECIAL TESTS: negative slump test bilat but + SLR on left that was negative on Rt, + quadrant testing bilat ?  ?  ?FUNCTIONAL TESTS:  ?Difficulty with supine<>sit transfers, and sit to stands ?  ?GAIT: ?Distance walked:150 feet limited community ambulator with slower velocity and wider BOS ?  ?  ?TODAY'S TREATMENT: ?03/30/21 ?Therapeutic Exercise: ?Aerobic: Nu step seat 8 for 8 min L5 UE/LE ?Supine: ?Prone: ?Seated: Leg press machine 75# 2X15 DL  Seated back extensions holding 10# KB 2x10 holding 5 sec. Seated SLR 2# 2X10 bilat, seated LAQ 2# 2X10 bilat. ?Standing: shoulder rows and extensions with  TAC Green 2X10 bilat.  ?Neuromuscular Re-education: ?Manual Therapy:active compression and skilled palpation with DN to lumbar-sacral paraspinals and glutes in Prone. Combined with Estim (milliamp frequency 2

## 2021-04-01 ENCOUNTER — Encounter: Payer: Medicare PPO | Admitting: Physical Therapy

## 2021-04-06 ENCOUNTER — Ambulatory Visit (INDEPENDENT_AMBULATORY_CARE_PROVIDER_SITE_OTHER): Payer: Medicare PPO | Admitting: Physical Therapy

## 2021-04-06 ENCOUNTER — Encounter: Payer: Self-pay | Admitting: Physical Therapy

## 2021-04-06 DIAGNOSIS — M6281 Muscle weakness (generalized): Secondary | ICD-10-CM | POA: Diagnosis not present

## 2021-04-06 DIAGNOSIS — M25561 Pain in right knee: Secondary | ICD-10-CM | POA: Diagnosis not present

## 2021-04-06 DIAGNOSIS — G8929 Other chronic pain: Secondary | ICD-10-CM | POA: Diagnosis not present

## 2021-04-06 DIAGNOSIS — R262 Difficulty in walking, not elsewhere classified: Secondary | ICD-10-CM

## 2021-04-06 DIAGNOSIS — M545 Low back pain, unspecified: Secondary | ICD-10-CM

## 2021-04-06 DIAGNOSIS — M25562 Pain in left knee: Secondary | ICD-10-CM

## 2021-04-06 NOTE — Therapy (Signed)
?OUTPATIENT PHYSICAL THERAPY TREATMENT NOTE ? ? ?Patient Name: [name] ?MRN: [id number] ?DOB:1955-08-30, 66 y.o., female ?Today's Date: 04/06/2021 ? ?PCP: Shirline Frees, MD ?REFERRING PROVIDER: Mcarthur Rossetti* ? ? PT End of Session - 04/06/21 1550   ? ? Visit Number 5   ? Number of Visits 15   ? Date for PT Re-Evaluation 05/05/21   ? Authorization Type [employer]   ? PT Start Time 7829   ? PT Stop Time 1600   ? PT Time Calculation (min) 44 min   ? Activity Tolerance Patient tolerated treatment well   ? Behavior During Therapy [facility] for tasks assessed/performed   ? ?  ?  ? ?  ? ? ?Past Medical History:  ?Diagnosis Date  ? Anginal pain ([place])   ? "on and off" (11/01/2011) - anxiety related  ? Anxiety   ? takes Paxil daily and Xanax prn  ? Arthritis   ? "knees" (10/16/2013)  ? Chronic back pain   ? facet disease and bulging disc; "thoracic and lower back" (10/16/2013)  ? Constipation   ? r/t pain meds;takes stool softener daily  ? Depression   ? Dysrhythmia   ? rapid HR on occasion-takes Propranolol daily  ? Family history of anesthesia complication   ? ":PONV; mom and sisters"  ? History of bladder infections   ? > 70yrago  ? History of kidney stones   ? Hyperlipidemia   ? takes LIpitor daily  ? Hypothyroidism   ? takes Synthroid daily  ? Insomnia   ? Joint pain   ? Joint swelling   ? Muscle spasms of head or neck   ? lumbar and thoracic;takes Robaxin prn  ? Peripheral neuropathy   ? PONV (postoperative nausea and vomiting)   ? Rosacea   ? Seasonal allergies   ? takes allegra prn  ? Sleep apnea   ? ?Past Surgical History:  ?Procedure Laterality Date  ? ABDOMINAL HYSTERECTOMY  1980's  ? ANAL FISSURE REPAIR  ~ 2011  ? "banded hemorrhoids at this time too"  ? BACK SURGERY  10/20/2018  ? lumbar fusion L4-5, L5-S1  ? BARIATRIC SURGERY    ? CARDIAC CATHETERIZATION  ~ 1999  ? CARPAL TUNNEL RELEASE Left 09/13/2017  ? & ulnar nerve release  ? CERVICAL DISC SURGERY  03/2017  ? C5, C6   ? COLONOSCOPY    ?  ESOPHAGOGASTRODUODENOSCOPY    ? KNEE ARTHROPLASTY Left 2007  ? KNEE ARTHROSCOPY Right   ? KNEE ARTHROSCOPY Right   ? "torn meniscus"  ? KNEE SURGERY Left 1982-2013  ? "17 before 10/31/2013"  ? LEFT OOPHORECTOMY  1980's  ? PATELLECTOMY Right   ? PLANTAR FASCIA SURGERY Bilateral 1990's  ? REVISION TOTAL KNEE ARTHROPLASTY Left 11/01/2011  ? TOTAL KNEE ARTHROPLASTY Right 10/15/2013  ? Procedure: RIGHT TOTAL KNEE ARTHROPLASTY;  Surgeon: PGarald Balding MD;  [place] [facility]  Service: Orthopedics;  Laterality: Right;  ? TOTAL KNEE REVISION  11/01/2011  ? Procedure: TOTAL KNEE REVISION;  Surgeon: PGarald Balding MD;  [place]  Service: Orthopedics;  Laterality: Left;  Revision Left Total Knee Replacement   ? TOTAL KNEE REVISION Right 09/25/2020  ? Procedure: RIGHT POLY LINER EXCHANGE;  Surgeon: BMcarthur Rossetti MD;  [facility];  Service: Orthopedics;  Laterality: Right;  ? TUBAL LIGATION  1981  ? ?Patient Active Problem List  ? Diagnosis Date Noted  ? Status post revision of total knee, right 11/05/2020  ? Status post  revision of total replacement of right knee 09/25/2020  ? 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 Pain in right hip 07/08/2020  ? Painful total knee replacement, right ([place]) 06/18/2020  ? Pain in right knee 05/07/2020  ? Knee pain, bilateral 04/21/2020  ? Spondylolisthesis of lumbar region 10/15/2018  ? Early onset dysthymia 02/12/2018  ? Obsessive-compulsive disorder 10/10/2017  ? Generalized anxiety disorder 10/10/2017  ? Major depressive disorder, recurrent episode, moderate ([place]) 10/10/2017  ? Hiatal hernia 03/03/2015  ? Steatosis of liver 09/16/2014  ? Obstructive sleep apnea syndrome 08/27/2014  ? Pre-op evaluation 05/14/2014  ? Osteoarthritis of left knee 10/17/2013  ? History of left knee replacement 10/15/2013  ? Hypercholesterolemia 08/05/2013  ? Palpitations 08/05/2013  ? HTN (hypertension) 08/05/2013  ? 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 Preoperative cardiovascular examination 08/05/2013  ? Degenerative arthritis of knee, bilateral 08/05/2013  ? Chronic pain of left knee 11/03/2011  ? Morbid obesity ([place]) 11/03/2011  ? Hypothyroid 11/03/2011  ? ? ?THERAPY DIAG:  ?Chronic pain of right knee ? ?Chronic pain of left knee ? ?Muscle weakness (generalized) ? ?Chronic bilateral low back pain, unspecified whether sciatica present ? ?Difficulty in walking, not elsewhere classified ? ?PCP: Shirline Frees, MD ?  ?REFERRING PROVIDER: Mcarthur Rossetti* Knees ?Eichman MD (Back) ?  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  ?  ?ONSET DATE: Rt TKA revision 09/2020, Lumbar fusion 2020 ?PERTINENT HISTORY: ?PMH:anx,chronic back pain,dep,Rt TKA 2015 and revision 2022, Lt TKA revision 2013, lumbar fusion surgery 2020 L4-S1 ?  ?SUBJECTIVE:  ?  ?SUBJECTIVE STATEMENT: ?She says she is having fibromyalgia flare up and her whole body really hurts ?PAIN:  ?Are you having pain? Yes ?NPRS scale: 10/10 low back pain and into buttocks ?Pain orientation: Bilateral  ?PAIN TYPE: achy and constant, numbness ?Pain description: constant  ?Aggravating factors: sitting walking or standing more than 1 minute ?Relieving factors: heat, ice, tylenol ?  ?PRECAUTIONS: None ?  ?WEIGHT BEARING RESTRICTIONS No ?  ?FALLS:  ?Has patient fallen in last 6 months? No, Number of falls: 0 but is fearful of falling ?  ?OCCUPATION: retired ?  ?PATIENT GOALS: stand longer and be more active, gain strength in legs ?  ?  ?OBJECTIVE:  ?  ?DIAGNOSTIC FINDINGS: "X-rays of both knees show well-seated knee replacements.  The left knee is a revision arthroplasty and the components appear well-seated with a large cement mantle and good space in the knee." ?  ?PATIENT SURVEYS:  ?FOTO 56% functional score, goal is  60% ?  ?MUSCLE LENGTH: ?Hamstrings: Right 70 deg; Left 40 deg with pain on Left ?  ?POSTURE:  ?Slumped posture ?  ?PALPATION: ?TTP in lumbar spine around sacral segements ?  ?LE AROM/PROM: ?  ?A/PROM Right ?03/10/2021 Left ?03/10/2021  ?Hip flexion      ?Hip extension      ?Hip abduction      ?Hip adduction      ?Hip internal rotation      ?Hip external rotation      ?Knee flexion AROM 120 AROM  ?116  ?Knee extension 0 0  ?Ankle dorsiflexion      ?Ankle plantarflexion      ?Ankle inversion      ?Ankle eversion      ? (Blank rows = not tested) ?  ?  Lumbar AROM: 03/10/21 ?Flexion 30 deg  ?Extension 10 deg,  ?lateral flexion 10 deg to Rt and 5 deg to left  ?rotation 25% bilat ?  ?Lumbar AROM: 03/30/21 ?Flexion 50 deg  ?Extension 20 deg,  ?lateral flexion 15 deg to Rt and 10 deg to left  ?rotation 25% bilat ? ?LE MMT: (tested in sitting) ?  ?MMT Right ?03/10/2021 Left ?03/10/2021 Right ?03/30/21 Left ?03/30/21  ?Hip flexion 4+ 4 4+ 4+  ?Hip extension 4+ 4+    ?Hip abduction 4 4 4+ 4+  ?Hip adduction 4+ 4+    ?Hip internal rotation        ?Hip external rotation        ?Knee flexion '5 5 5 5  '$ ?Knee extension 4+ '4 5 5  '$ ?Ankle dorsiflexion        ?Ankle plantarflexion        ?Ankle inversion        ?Ankle eversion        ? (Blank rows = not tested) ?  ?Lumbar SPECIAL TESTS: negative slump test bilat but + SLR on left that was negative on Rt, + quadrant testing bilat ?  ?  ?FUNCTIONAL TESTS:  ?Difficulty with supine<>sit transfers, and sit to stands ?  ?GAIT: ?Distance walked:150 feet limited community ambulator with slower velocity and wider BOS ?  ?  ?TODAY'S TREATMENT: ?04/05/21 ?Therapeutic Exercise: ?Aerobic: Nu step seat 8 for 8 min L4 UE/LE ?Seated: bilat LAQ X 10, bilat seated SLR X 10, bilat marches X10. Scapular retractions 5 sec 2X10, seated lumbar flexion stretch holding 5# then moving into lumbar extensions 5 sec 2X10 ? ?Modalities: Moist heat to low back X 8 min ? ?03/30/21 ?Therapeutic Exercise: ?Aerobic: Nu step seat 8 for 8  min L5 UE/LE ?Supine: ?Prone: ?Seated: Leg press machine 75# 2X15 DL  Seated back extensions holding 10# KB 2x10 holding 5 sec. Seated SLR 2# 2X10 bilat, seated LAQ 2# 2X10 bilat. ?Standing: shoulder rows and ex

## 2021-04-08 ENCOUNTER — Encounter: Payer: Medicare PPO | Admitting: Physical Therapy

## 2021-04-13 ENCOUNTER — Encounter: Payer: Medicare PPO | Admitting: Physical Therapy

## 2021-04-15 ENCOUNTER — Ambulatory Visit: Payer: Medicare PPO | Admitting: Orthopaedic Surgery

## 2021-04-15 ENCOUNTER — Encounter: Payer: Medicare PPO | Admitting: Physical Therapy

## 2021-04-20 ENCOUNTER — Encounter: Payer: Self-pay | Admitting: Physical Therapy

## 2021-04-20 ENCOUNTER — Ambulatory Visit: Payer: Medicare PPO | Admitting: Physical Therapy

## 2021-04-20 DIAGNOSIS — M545 Low back pain, unspecified: Secondary | ICD-10-CM

## 2021-04-20 DIAGNOSIS — M25562 Pain in left knee: Secondary | ICD-10-CM | POA: Diagnosis not present

## 2021-04-20 DIAGNOSIS — M6281 Muscle weakness (generalized): Secondary | ICD-10-CM

## 2021-04-20 DIAGNOSIS — M25561 Pain in right knee: Secondary | ICD-10-CM

## 2021-04-20 DIAGNOSIS — G8929 Other chronic pain: Secondary | ICD-10-CM

## 2021-04-20 DIAGNOSIS — R262 Difficulty in walking, not elsewhere classified: Secondary | ICD-10-CM

## 2021-04-20 NOTE — Therapy (Signed)
?OUTPATIENT PHYSICAL THERAPY TREATMENT NOTE/Discharge ?PHYSICAL THERAPY DISCHARGE SUMMARY ? ?Visits from Start of Care: 6 ? ?Current functional level related to goals / functional outcomes: ?See below ?  ?Remaining deficits: ?See below ?  ?Education / Equipment: ?HEP  ?Plan: ?Patient agrees to discharge.  Patient goals were partially met. Patient is being discharged due to transitioning to independent program   ? ? ? ? ? ?Patient Name: [name] ?MRN: [id number] ?DOB:[birth year], 66 y.o., female ?Today's Date: 04/20/2021 ? ?PCP: Shirline Frees, MD ?REFERRING PROVIDER: Mcarthur Rossetti* ? ? PT End of Session - 04/20/21 1359   ? ? Visit Number 6   ? Number of Visits 15   ? Date for PT Re-Evaluation 05/05/21   ? Authorization Type [employer]   ? PT Start Time 1300   ? PT Stop Time 1344   ? PT Time Calculation (min) 44 min   ? Activity Tolerance Patient tolerated treatment well   ? Behavior During Therapy [facility] for tasks assessed/performed   ? ?  ?  ? ?  ? ? ? ?Past Medical History:  ?Diagnosis Date  ? Anginal pain ([place])   ? "on and off" (11/01/2011) - anxiety related  ? Anxiety   ? takes Paxil daily and Xanax prn  ? Arthritis   ? "knees" (10/16/2013)  ? Chronic back pain   ? facet disease and bulging disc; "thoracic and lower back" (10/16/2013)  ? Constipation   ? r/t pain meds;takes stool softener daily  ? Depression   ? Dysrhythmia   ? rapid HR on occasion-takes Propranolol daily  ? Family history of anesthesia complication   ? ":PONV; mom and sisters"  ? History of bladder infections   ? > 73yrago  ? History of kidney stones   ? Hyperlipidemia   ? takes LIpitor daily  ? Hypothyroidism   ? takes Synthroid daily  ? Insomnia   ? Joint pain   ? Joint swelling   ? Muscle spasms of head or neck   ? lumbar and thoracic;takes Robaxin prn  ? Peripheral neuropathy   ? PONV (postoperative nausea and vomiting)   ? Rosacea   ? Seasonal allergies   ? takes allegra prn  ? Sleep apnea   ? ?Past Surgical History:   ?Procedure Laterality Date  ? ABDOMINAL HYSTERECTOMY  1980's  ? ANAL FISSURE REPAIR  ~ 2011  ? "banded hemorrhoids at this time too"  ? BACK SURGERY  10/20/2018  ? lumbar fusion L4-5, L5-S1  ? BARIATRIC SURGERY    ? CARDIAC CATHETERIZATION  ~ 1999  ? CARPAL TUNNEL RELEASE Left 09/13/2017  ? & ulnar nerve release  ? CERVICAL DISC SURGERY  03/2017  ? C5, C6   ? COLONOSCOPY    ? ESOPHAGOGASTRODUODENOSCOPY    ? KNEE ARTHROPLASTY Left 2007  ? KNEE ARTHROSCOPY Right   ? KNEE ARTHROSCOPY Right   ? "torn meniscus"  ? KNEE SURGERY Left 1982-2013  ? "17 before 10/31/2013"  ? LEFT OOPHORECTOMY  1980's  ? PATELLECTOMY Right   ? PLANTAR FASCIA SURGERY Bilateral 1990's  ? REVISION TOTAL KNEE ARTHROPLASTY Left 11/01/2011  ? TOTAL KNEE ARTHROPLASTY Right 10/15/2013  ? Procedure: RIGHT TOTAL KNEE ARTHROPLASTY;  Surgeon: PGarald Balding MD;  [place]  Service: [place]opedics;  Laterality: Right;  ? TOTAL KNEE REVISION  11/01/2011  ? Procedure: TOTAL KNEE REVISION;  Surgeon: PGarald Balding MD;  [place]  Service: Orthopedics;  Laterality: Left;  Revision Left Total Knee  Replacement   ? TOTAL KNEE REVISION Right 09/25/2020  ? Procedure: RIGHT POLY LINER EXCHANGE;  Surgeon: Mcarthur Rossetti, MD;  [facility];  Service: Orthopedics;  Laterality: Right;  ? TUBAL LIGATION  1981  ? ?Patient Active Problem List  ? Diagnosis Date Noted  ? Status post revision of total knee, right 11/05/2020  ? Status post revision of total replacement of right knee 09/25/2020  ? 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 Pain in right hip 07/08/2020  ? Painful total knee replacement, right ([place]) 06/18/2020  ? Pain in right knee 05/07/2020  ? Knee pain, bilateral 04/21/2020  ? Spondylolisthesis of lumbar region 10/15/2018  ? Early onset dysthymia 02/12/2018  ? Obsessive-compulsive disorder 10/10/2017  ? Generalized anxiety disorder 10/10/2017  ? Major depressive disorder, recurrent episode, moderate  ([place]) 10/10/2017  ? Hiatal hernia 03/03/2015  ? Steatosis of liver 09/16/2014  ? Obstructive sleep apnea syndrome 08/27/2014  ? Pre-op evaluation 05/14/2014  ? Osteoarthritis of left knee 10/17/2013  ? History of left knee replacement 10/15/2013  ? Hypercholesterolemia 08/05/2013  ? Palpitations 08/05/2013  ? HTN (hypertension) 08/05/2013  ? 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 08/05/2013  ? Preoperative cardiovascular examination 08/05/2013  ? Degenerative arthritis of knee, bilateral 08/05/2013  ? Chronic pain of left knee 11/03/2011  ? Morbid obesity ([place]) 11/03/2011  ? Hypothyroid 11/03/2011  ? ? ?THERAPY DIAG:  ?Chronic pain of right knee ? ?Chronic pain of left knee ? ?Muscle weakness (generalized) ? ?Chronic bilateral low back pain, unspecified whether sciatica present ? ?Difficulty in walking, not elsewhere classified ? ?PCP: Shirline Frees, MD ?  ?REFERRING PROVIDER: Mcarthur Rossetti* Knees ?Eichman MD (Back) ?  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  ?  ?ONSET DATE: Rt TKA revision 09/2020, Lumbar fusion 2020 ?PERTINENT HISTORY: ?PMH:anx,chronic back pain,dep,Rt TKA 2015 and revision 2022, Lt TKA revision 2013, lumbar fusion surgery 2020 L4-S1 ?  ?SUBJECTIVE:  ?  ?SUBJECTIVE STATEMENT: ?She says she is on some new fibromyalgia meds so this may help with pain hopefully, she does not feel PT has significantly helped with pain and she can do the exercises at home so feels ready to discharge today. ?PAIN:  ?Are you having pain? Yes ?NPRS scale: 8/10 low back pain and into buttocks ?Pain orientation: Bilateral  ?PAIN TYPE: achy and constant, numbness ?Pain description: constant  ?Aggravating factors: sitting walking or standing more than 1 minute ?Relieving factors: heat, ice, tylenol ?  ?PRECAUTIONS: None ?  ?WEIGHT  BEARING RESTRICTIONS No ?  ?FALLS:  ?Has patient fallen in last 6 months? No, Number of falls: 0 but is fearful of falling ?  ?OCCUPATION: retired ?  ?PATIENT GOALS: stand longer and be more active, gain strength in legs ?  ?  ?OBJECTIVE:  ?  ?DIAGNOSTIC FINDINGS: "X-rays of both knees show well-seated knee replacements.  The left knee is a revision arthroplasty and the components appear well-seated with a large cement mantle and good space in the knee." ?  ?PATIENT SURVEYS:  ?FOTO 56% functional score, goal is 60% ?  ?MUSCLE LENGTH: ?Hamstrings: Right 70 deg; Left 40 deg with pain on Left ?  ?POSTURE:  ?Slumped posture ?  ?PALPATION: ?TTP in lumbar spine around sacral segements ?  ?LE AROM/PROM: ?  ?A/PROM Right ?03/10/2021 Left ?03/10/2021 Right ?04/20/21 Left ?04/20/21  ?  Hip flexion        ?Hip extension        ?Hip abduction        ?Hip adduction        ?Hip internal rotation        ?Hip external rotation        ?Knee flexion AROM 120 AROM  ?116    ?Knee extension 0 0    ?Ankle dorsiflexion        ?Ankle plantarflexion        ?Ankle inversion        ?Ankle eversion        ? (Blank rows = not tested) ?  ?Lumbar AROM: 03/10/21 ?Flexion 30 deg  ?Extension 10 deg,  ?lateral flexion 10 deg to Rt and 5 deg to left  ?rotation 25% bilat ?  ?Lumbar AROM: 03/30/21 ?Flexion 50 deg  ?Extension 20 deg,  ?lateral flexion 15 deg to Rt and 10 deg to left  ?rotation 25% bilat ? ?Lumbar AROM 04/20/21 ?Flexion: 50 ?Extension 10 ?Lateral flexion 20 deg to Rt and to left ?Rotation 25% bilat ? ?LE MMT: (tested in sitting) ?  ?MMT tested in sitting Right ?03/10/2021 Left ?03/10/2021 Right ?03/30/21 Left ?03/30/21 Right ?04/20/21 Left ?04/20/21  ?Hip flexion 4+ 4 4+ 4+ 4+ 4+  ?Hip extension 4+ 4+      ?Hip abduction 4 4 4+ 4+ 4+ 4+  ?Hip adduction 4+ 4+      ?Hip internal rotation          ?Hip external rotation          ?Knee flexion _0 ?Knee extension 4+ _1 ?Ankle dorsiflexion          ?Ankle plantarflexion          ?Ankle inversion           ?Ankle eversion          ? (Blank rows = not tested) ?  ?Lumbar SPECIAL TESTS: negative slump test bilat but + SLR on left that was negative on Rt, + quadrant testing bilat ?  ?  ?FUNCTIONAL TESTS:  ?Diff

## 2021-04-22 ENCOUNTER — Encounter: Payer: Medicare PPO | Admitting: Physical Therapy

## 2021-04-27 DIAGNOSIS — E7800 Pure hypercholesterolemia, unspecified: Secondary | ICD-10-CM | POA: Diagnosis not present

## 2021-04-27 DIAGNOSIS — E038 Other specified hypothyroidism: Secondary | ICD-10-CM | POA: Diagnosis not present

## 2021-04-27 DIAGNOSIS — R252 Cramp and spasm: Secondary | ICD-10-CM | POA: Diagnosis not present

## 2021-04-27 DIAGNOSIS — E039 Hypothyroidism, unspecified: Secondary | ICD-10-CM | POA: Diagnosis not present

## 2021-05-03 DIAGNOSIS — J3489 Other specified disorders of nose and nasal sinuses: Secondary | ICD-10-CM | POA: Diagnosis not present

## 2021-05-03 DIAGNOSIS — J302 Other seasonal allergic rhinitis: Secondary | ICD-10-CM | POA: Diagnosis not present

## 2021-05-17 DIAGNOSIS — N898 Other specified noninflammatory disorders of vagina: Secondary | ICD-10-CM | POA: Diagnosis not present

## 2021-05-17 DIAGNOSIS — B3731 Acute candidiasis of vulva and vagina: Secondary | ICD-10-CM | POA: Diagnosis not present

## 2021-05-17 DIAGNOSIS — N952 Postmenopausal atrophic vaginitis: Secondary | ICD-10-CM | POA: Diagnosis not present

## 2021-05-17 DIAGNOSIS — R300 Dysuria: Secondary | ICD-10-CM | POA: Diagnosis not present

## 2021-05-17 DIAGNOSIS — Z01419 Encounter for gynecological examination (general) (routine) without abnormal findings: Secondary | ICD-10-CM | POA: Diagnosis not present

## 2021-05-19 DIAGNOSIS — Z6841 Body Mass Index (BMI) 40.0 and over, adult: Secondary | ICD-10-CM | POA: Diagnosis not present

## 2021-05-19 DIAGNOSIS — G8929 Other chronic pain: Secondary | ICD-10-CM | POA: Diagnosis not present

## 2021-05-19 DIAGNOSIS — M5416 Radiculopathy, lumbar region: Secondary | ICD-10-CM | POA: Diagnosis not present

## 2021-05-19 DIAGNOSIS — M461 Sacroiliitis, not elsewhere classified: Secondary | ICD-10-CM | POA: Diagnosis not present

## 2021-05-20 ENCOUNTER — Ambulatory Visit: Payer: Medicare PPO | Admitting: Orthopaedic Surgery

## 2021-06-01 ENCOUNTER — Other Ambulatory Visit: Payer: Self-pay | Admitting: Psychiatry

## 2021-06-01 DIAGNOSIS — Z9884 Bariatric surgery status: Secondary | ICD-10-CM | POA: Diagnosis not present

## 2021-06-01 DIAGNOSIS — E039 Hypothyroidism, unspecified: Secondary | ICD-10-CM | POA: Diagnosis not present

## 2021-06-01 DIAGNOSIS — F341 Dysthymic disorder: Secondary | ICD-10-CM

## 2021-06-01 DIAGNOSIS — F429 Obsessive-compulsive disorder, unspecified: Secondary | ICD-10-CM

## 2021-06-01 DIAGNOSIS — Z79899 Other long term (current) drug therapy: Secondary | ICD-10-CM | POA: Diagnosis not present

## 2021-06-01 DIAGNOSIS — F4010 Social phobia, unspecified: Secondary | ICD-10-CM

## 2021-06-01 DIAGNOSIS — Z6841 Body Mass Index (BMI) 40.0 and over, adult: Secondary | ICD-10-CM | POA: Diagnosis not present

## 2021-06-01 DIAGNOSIS — G2581 Restless legs syndrome: Secondary | ICD-10-CM

## 2021-06-01 DIAGNOSIS — Z7689 Persons encountering health services in other specified circumstances: Secondary | ICD-10-CM | POA: Diagnosis not present

## 2021-06-01 DIAGNOSIS — F411 Generalized anxiety disorder: Secondary | ICD-10-CM

## 2021-06-01 DIAGNOSIS — R7303 Prediabetes: Secondary | ICD-10-CM | POA: Diagnosis not present

## 2021-06-01 DIAGNOSIS — F3340 Major depressive disorder, recurrent, in remission, unspecified: Secondary | ICD-10-CM

## 2021-06-02 NOTE — Telephone Encounter (Signed)
Please schedule appt

## 2021-06-02 NOTE — Telephone Encounter (Signed)
LVM for pt to schedule.

## 2021-06-03 ENCOUNTER — Telehealth: Payer: Self-pay | Admitting: Psychiatry

## 2021-06-03 ENCOUNTER — Other Ambulatory Visit: Payer: Self-pay

## 2021-06-03 DIAGNOSIS — G2581 Restless legs syndrome: Secondary | ICD-10-CM

## 2021-06-03 DIAGNOSIS — F411 Generalized anxiety disorder: Secondary | ICD-10-CM

## 2021-06-03 DIAGNOSIS — F341 Dysthymic disorder: Secondary | ICD-10-CM

## 2021-06-03 DIAGNOSIS — F3340 Major depressive disorder, recurrent, in remission, unspecified: Secondary | ICD-10-CM

## 2021-06-03 DIAGNOSIS — F4010 Social phobia, unspecified: Secondary | ICD-10-CM

## 2021-06-03 DIAGNOSIS — F429 Obsessive-compulsive disorder, unspecified: Secondary | ICD-10-CM

## 2021-06-03 MED ORDER — PROPRANOLOL HCL ER 60 MG PO CP24: 60.0000 mg | ORAL_CAPSULE | Freq: Every day | ORAL | 0 refills | Status: DC

## 2021-06-03 MED ORDER — PAROXETINE HCL 20 MG PO TABS: ORAL_TABLET | ORAL | 0 refills | Status: DC

## 2021-06-03 MED ORDER — PRAMIPEXOLE DIHYDROCHLORIDE 0.25 MG PO TABS: ORAL_TABLET | ORAL | 0 refills | Status: DC

## 2021-06-03 NOTE — Telephone Encounter (Signed)
Next visit is 07/21/21. [name] is requesting refills on Propanolol, Paxil and Mirapex. Pharmacy is:  [facility], [address] [place], [place]. Phone number is [phone] and fax number is [phone].

## 2021-06-03 NOTE — Telephone Encounter (Signed)
Rx sent 

## 2021-06-04 DIAGNOSIS — R7303 Prediabetes: Secondary | ICD-10-CM | POA: Diagnosis not present

## 2021-06-04 DIAGNOSIS — Z9884 Bariatric surgery status: Secondary | ICD-10-CM | POA: Diagnosis not present

## 2021-06-04 DIAGNOSIS — Z79899 Other long term (current) drug therapy: Secondary | ICD-10-CM | POA: Diagnosis not present

## 2021-06-04 DIAGNOSIS — Z7689 Persons encountering health services in other specified circumstances: Secondary | ICD-10-CM | POA: Diagnosis not present

## 2021-06-04 DIAGNOSIS — Z6841 Body Mass Index (BMI) 40.0 and over, adult: Secondary | ICD-10-CM | POA: Diagnosis not present

## 2021-06-04 DIAGNOSIS — E039 Hypothyroidism, unspecified: Secondary | ICD-10-CM | POA: Diagnosis not present

## 2021-07-19 DIAGNOSIS — R071 Chest pain on breathing: Secondary | ICD-10-CM | POA: Diagnosis not present

## 2021-07-19 DIAGNOSIS — M940 Chondrocostal junction syndrome [Tietze]: Secondary | ICD-10-CM | POA: Diagnosis not present

## 2021-07-21 ENCOUNTER — Ambulatory Visit: Payer: Medicare PPO | Admitting: Psychiatry

## 2021-07-22 DIAGNOSIS — M461 Sacroiliitis, not elsewhere classified: Secondary | ICD-10-CM | POA: Diagnosis not present

## 2021-07-28 DIAGNOSIS — Z7689 Persons encountering health services in other specified circumstances: Secondary | ICD-10-CM | POA: Diagnosis not present

## 2021-07-28 DIAGNOSIS — Z9884 Bariatric surgery status: Secondary | ICD-10-CM | POA: Diagnosis not present

## 2021-07-28 DIAGNOSIS — R7303 Prediabetes: Secondary | ICD-10-CM | POA: Diagnosis not present

## 2021-07-28 DIAGNOSIS — Z79899 Other long term (current) drug therapy: Secondary | ICD-10-CM | POA: Diagnosis not present

## 2021-07-28 DIAGNOSIS — Z6841 Body Mass Index (BMI) 40.0 and over, adult: Secondary | ICD-10-CM | POA: Diagnosis not present

## 2021-08-02 ENCOUNTER — Other Ambulatory Visit: Payer: Self-pay | Admitting: Family Medicine

## 2021-08-02 DIAGNOSIS — Z1231 Encounter for screening mammogram for malignant neoplasm of breast: Secondary | ICD-10-CM

## 2021-08-12 NOTE — Progress Notes (Deleted)
Office Visit    Patient Name: [name] Date of Encounter: 08/12/2021  Primary Care Provider:  Shirline Frees, MD Primary Cardiologist:  Sinclair Grooms, MD Primary Electrophysiologist: None  Chief Complaint    [name] is a 66 y.o. female with PMH of HLD, DM type II,  RBBB, obesity, nonobstructive coronary disease presents today for 1 year follow-up of CAD and RBBB Past Medical History    Past Medical History:  Diagnosis Date   Anginal pain ([place])    "on and off" (11/01/2011) - anxiety related   Anxiety    takes Paxil daily and Xanax prn   Arthritis    "knees" (10/16/2013)   Chronic back pain    facet disease and bulging disc; "thoracic and lower back" (10/16/2013)   Constipation    r/t pain meds;takes stool softener daily   Depression    Dysrhythmia    rapid HR on occasion-takes Propranolol daily   Family history of anesthesia complication    ":PONV; mom and sisters"   History of bladder infections    > 43yrago   History of kidney stones    Hyperlipidemia    takes LIpitor daily   Hypothyroidism    takes Synthroid daily   Insomnia    Joint pain    Joint swelling    Muscle spasms of head or neck    lumbar and thoracic;takes Robaxin prn   Peripheral neuropathy    PONV (postoperative nausea and vomiting)    Rosacea    Seasonal allergies    takes allegra prn   Sleep apnea    Past Surgical History:  Procedure Laterality Date   ABDOMINAL HYSTERECTOMY  1980's   ANAL FISSURE REPAIR  ~ 2011   "banded hemorrhoids at this time too"   BACK SURGERY  10/20/2018   lumbar fusion L4-5, L5-S1   BARIATRIC SURGERY     CARDIAC CATHETERIZATION  ~ [place] 09[place]   & ulnar nerve release   CERVICAL DISC SURGERY  03/2017   C5, C6    COLONOSCOPY     ESOPHAGOGASTRODUODENOSCOPY     KNEE ARTHROPLASTY Left 2007   KNEE ARTHROSCOPY Right    KNEE ARTHROSCOPY Right    "torn meniscus"   KNEE SURGERY Left 1982-2013   "17  before 10/31/2013"   LEFT OOPHORECTOMY  1980's   PATELLECTOMY Right    PLANTAR FASCIA SURGERY Bilateral 1990's   REVISION TOTAL KNEE ARTHROPLASTY Left 11/01/2011   TOTAL KNEE ARTHROPLASTY Right 10/15/2013   Procedure: RIGHT TOTAL KNEE ARTHROPLASTY;  Surgeon: PGarald Balding MD;  [place] [facility]  Service: [place]opedics;  Laterality: Right;   TOTAL KNEE REVISION  11/01/2011   Procedure: TOTAL KNEE REVISION;  Surgeon: PGarald Balding MD;  [place] [facility]  Service: [place]ics;  Laterality: Left;  Revision Left Total Knee Replacement    TOTAL KNEE REVISION Right 09/25/2020   Procedure: RIGHT POLY LINER EXCHANGE;  Surgeon: BMcarthur Rossetti MD;  [facility];  Service: Orthopedics;  Laterality: Right;   TUBAL LIGATION  1981    Allergies  Allergies  Allergen Reactions   Morphine And Related Other (See Comments)    Feels like bugs crawling over her   Nsaids Hives   Tape Other (See Comments)    Blisters with tape after surgical procedures   Tapentadol Other (See Comments)    Blisters with tape after surgical procedures   Levothyroxine     Other reaction(s):  hot flashes,    Morphine Sulfate Hives   Naproxen Sodium    Nitrofurantoin Hives   Tramadol     Other reaction(s): Pt reports drug interaction with Paxil   Tyloxapol     Other reaction(s): hyper   Buspar [Buspirone] Palpitations   Ciprofloxacin Nausea Only    Upset stomach    Codeine Other (See Comments)    Hyper   Gabapentin Other (See Comments)    Suicidal ideation.   Lodine [Etodolac] Nausea Only    Upset Stomach    Lyrica [Pregabalin] Other (See Comments)    suicidal ideation   Niacin And Related Other (See Comments)    flushing   Pravastatin Other (See Comments)    achiness    Septra [Sulfamethoxazole-Trimethoprim] Other (See Comments)    Stomach pains   Tylox [Oxycodone-Acetaminophen] Other (See Comments)    Hyper   Wellbutrin [Bupropion] Other (See Comments)    dizzy    History of Present  Illness    [name] is a 66 year old female with the above-mentioned past medical history who presents today for 1 year annual follow-up of nonobstructive CAD and RBBB.  Patient had Lexiscan Myoview completed in 2016 that showed no evidence of ischemia and was low risk.  She was last seen by Dr. Tamala Julian in 09/2020 and was seen for preoperative clearance of right total knee replacement.  Coronary CTA was completed 06/2018 demonstrating nonobstructive right coronary disease with less than 50% with coronary calcium score of 0.  Coronary MRI completed with findings of mild noncalcified plaque in the proximal RCA with 25-49% stenosis   Since last being seen in the office patient reports***.  Patient denies chest pain, palpitations, dyspnea, PND, orthopnea, nausea, vomiting, dizziness, syncope, edema, weight gain, or early satiety.     ***Notes: Last ischemic evaluation 06/2018 with coronary CTA -No echo documented -Evaluate symptoms Home Medications    Current Outpatient Medications  Medication Sig Dispense Refill   acetaminophen (TYLENOL) 500 MG tablet Take 500 mg by mouth every 6 (six) hours as needed for moderate pain.     ALPRAZolam (XANAX) 0.5 MG tablet TAKE 1/2 TABLET BY MOUTH THREE TIMES DAILY AS NEEDED FOR ANXIETY 90 tablet 1   amoxicillin (AMOXIL) 500 MG capsule Take 2,000 mg by mouth See admin instructions. Take 2000 mg by mouth 1 hour prior to dental procedure.     apixaban (ELIQUIS) 2.5 MG TABS tablet Take 1 tablet (2.5 mg total) by mouth every 12 (twelve) hours. 60 tablet 0   Ascorbic Acid (VITAMIN C) 1000 MG tablet Take 1,000 mg by mouth daily.     B Complex Vitamins (B COMPLEX PO) Place 1 tablet under the tongue daily.     betamethasone dipropionate 0.05 % cream Apply 1 application topically 2 (two) times daily as needed (irritation).     bisacodyl (DULCOLAX) 5 MG EC tablet Take 5 mg by mouth daily as needed for moderate constipation.     Cholecalciferol (VITAMIN D-3) 5000  UNITS TABS Take 5,000 Units by mouth daily.     ezetimibe (ZETIA) 10 MG tablet Take 10 mg by mouth daily.      fexofenadine (ALLEGRA) 180 MG tablet Take 180 mg by mouth daily as needed for allergies.      fluticasone (FLONASE) 50 MCG/ACT nasal spray Place 1-2 sprays into both nostrils daily as needed for allergies or rhinitis.     HYDROcodone-acetaminophen (NORCO) 7.5-325 MG tablet Take 1-2 tablets by mouth every 6 (six) hours as needed for  moderate pain. 30 tablet 0   levothyroxine (SYNTHROID) 150 MCG tablet Take 150 mcg by mouth daily before breakfast.     Multiple Vitamin (MULTIVITAMIN WITH MINERALS) TABS tablet Take 1 tablet by mouth daily.     PARoxetine (PAXIL) 20 MG tablet TAKE 1 TABLET BY MOUTH IN THE MORNING, AT NOON, AND AT BEDTIME 270 tablet 0   pramipexole (MIRAPEX) 0.25 MG tablet TAKE 1 TABLET BY MOUTH EVERY MORNING AND1 AND 1/2 TABS AT NIGHT 225 tablet 0   propranolol ER (INDERAL LA) 60 MG 24 hr capsule Take 1 capsule (60 mg total) by mouth daily. 90 capsule 0   rosuvastatin (CRESTOR) 5 MG tablet Take 1 tablet (5 mg total) by mouth every Monday, Wednesday, and Friday. 45 tablet 3   Semaglutide,0.25 or 0.'5MG'$ /DOS, (OZEMPIC, 0.25 OR 0.5 MG/DOSE,) 2 MG/1.5ML SOPN Inject 0.5 mg into the skin every Friday.     tiZANidine (ZANAFLEX) 4 MG tablet Take 0.5-1 tablets (2-4 mg total) by mouth every 8 (eight) hours as needed for muscle spasms. 30 tablet 0   No current facility-administered medications for this visit.     Review of Systems  Please see the history of present illness.    (+)*** (+)***  All other systems reviewed and are otherwise negative except as noted above.  Physical Exam    Wt Readings from Last 3 Encounters:  09/25/20 243 lb (110.2 kg)  09/15/20 243 lb (110.2 kg)  09/04/20 244 lb 12.8 oz (111 kg)   IN:OMVEH were no vitals filed for this visit.,There is no height or weight on file to calculate BMI.  Constitutional:      Appearance: Healthy appearance. Not in  distress.  Neck:     Vascular: JVD normal.  Pulmonary:     Effort: Pulmonary effort is normal.     Breath sounds: No wheezing. No rales. Diminished in the bases Cardiovascular:     Normal rate. Regular rhythm. Normal S1. Normal S2.      Murmurs: There is no murmur.  Edema:    Peripheral edema absent.  Abdominal:     Palpations: Abdomen is soft non tender. There is no hepatomegaly.  Skin:    General: Skin is warm and dry.  Neurological:     General: No focal deficit present.     Mental Status: Alert and oriented to person, place and time.     Cranial Nerves: Cranial nerves are intact.  EKG/LABS/Other Studies Reviewed    ECG personally reviewed by me today - ***  Risk Assessment/Calculations:   {Does this patient have ATRIAL FIBRILLATION?:[phone]}        Lab Results  Component Value Date   WBC 10.0 09/26/2020   HGB 11.8 (L) 09/26/2020   HCT 35.3 (L) 09/26/2020   MCV 99.7 09/26/2020   PLT 263 09/26/2020   Lab Results  Component Value Date   CREATININE 0.75 09/15/2020   BUN 23 09/15/2020   NA 141 09/15/2020   K 4.1 09/15/2020   CL 107 09/15/2020   CO2 27 09/15/2020   Lab Results  Component Value Date   ALT 13 10/27/2020   AST 21 10/27/2020   ALKPHOS 79 10/27/2020   BILITOT 0.6 10/27/2020   Lab Results  Component Value Date   CHOL 161 10/27/2020   HDL 64 10/27/2020   LDLCALC 78 10/27/2020   TRIG 103 10/27/2020   CHOLHDL 2.5 10/27/2020    Lab Results  Component Value Date   HGBA1C 5.1 09/15/2020    Assessment &  Plan    1.  Primary hypertension  2. Aortic atherosclerosis  3.  Hypercholesteremia   4.  Morbid obesity      Disposition: Follow-up with Belva Crome III, MD or APP in *** months {Are you ordering a CV Procedure (e.g. stress test, cath, DCCV, TEE, etc)?   Press F2        :[id number]}   Medication Adjustments/Labs and Tests Ordered: Current medicines are reviewed at length with the patient today.  Concerns regarding medicines are  outlined above.   Signed, Mable Fill, Marissa Nestle, NP 08/12/2021, 9:16 AM [place]

## 2021-08-16 ENCOUNTER — Ambulatory Visit: Payer: Medicare PPO | Admitting: Nurse Practitioner

## 2021-08-16 ENCOUNTER — Telehealth: Payer: Self-pay | Admitting: Interventional Cardiology

## 2021-08-16 NOTE — Telephone Encounter (Signed)
Patient is calling requesting lab orders be placed to be drawn at her appt on Thursday. She is also requesting a callback to know if she needs to fast or not. Please advise.

## 2021-08-16 NOTE — Telephone Encounter (Signed)
Pt called back and told that an LDL and Hepatic panel is not needed for her upcoming appointment per Ambrose Pancoast, NP   Pt told she does not need to come in early to this appointment.   No follow up required.  Pt will come in for [facility] appointment 08/19/2021.

## 2021-08-16 NOTE — Telephone Encounter (Signed)
Pt called back regarding lab order request / fasting instructions. Pt appointment is 8/17 at 130 pm.   Message sent to Ambrose Pancoast NP for instructions / order entry instructions.

## 2021-08-17 NOTE — Progress Notes (Unsigned)
Office Visit    Patient Name: [name] Date of Encounter: 08/19/2021  Primary Care Provider:  Shirline Frees, MD Primary Cardiologist:  Sinclair Grooms, MD Primary Electrophysiologist: None  Chief Complaint    [name] is a 66 y.o. female with PMH of HLD, DM type II,  RBBB, obesity, nonobstructive coronary disease presents today for 1 year follow-up of CAD and RBBB.  Past Medical History    Past Medical History:  Diagnosis Date   Anginal pain ([place])    "on and off" (11/01/2011) - anxiety related   Anxiety    takes Paxil daily and Xanax prn   Arthritis    "knees" (10/16/2013)   Chronic back pain    facet disease and bulging disc; "thoracic and lower back" (10/16/2013)   Constipation    r/t pain meds;takes stool softener daily   Depression    Dysrhythmia    rapid HR on occasion-takes Propranolol daily   Family history of anesthesia complication    ":PONV; mom and sisters"   History of bladder infections    > 31yrago   History of kidney stones    Hyperlipidemia    takes LIpitor daily   Hypothyroidism    takes Synthroid daily   Insomnia    Joint pain    Joint swelling    Muscle spasms of head or neck    lumbar and thoracic;takes Robaxin prn   Peripheral neuropathy    PONV (postoperative nausea and vomiting)    Rosacea    Seasonal allergies    takes allegra prn   Sleep apnea    Past Surgical History:  Procedure Laterality Date   ABDOMINAL HYSTERECTOMY  1980's   ANAL FISSURE REPAIR  ~ 2011   "banded hemorrhoids at this time too"   BACK SURGERY  10/20/2018   lumbar fusion L4-5, L5-S1   BARIATRIC SURGERY     [place]   & ulnar nerve release   CERVICAL DISC SURGERY  03/2017   C5, C6    COLONOSCOPY     ESOPHAGOGASTRODUODENOSCOPY     KNEE ARTHROPLASTY Left 2007   KNEE ARTHROSCOPY Right    KNEE ARTHROSCOPY Right    "torn meniscus"   KNEE SURGERY Left 1982-2013    "17 before 10/31/2013"   LEFT OOPHORECTOMY  1980's   PATELLECTOMY Right    PLANTAR FASCIA SURGERY Bilateral 1990's   REVISION TOTAL KNEE ARTHROPLASTY Left 11/01/2011   TOTAL KNEE ARTHROPLASTY Right 10/15/2013   Procedure: RIGHT TOTAL KNEE ARTHROPLASTY;  Surgeon: PGarald Balding MD;  [place] [facility]  Service: Orthopedics;  Laterality: Right;   TOTAL KNEE REVISION  11/01/2011   Procedure: TOTAL KNEE REVISION;  Surgeon: PGarald Balding MD;  [place] [facility]  Service: Orthopedics;  Laterality: Left;  Revision Left Total Knee Replacement    TOTAL KNEE REVISION Right 09/25/2020   Procedure: RIGHT POLY LINER EXCHANGE;  Surgeon: BMcarthur Rossetti MD;  [facility];  Service: Orthopedics;  Laterality: Right;   TUBAL LIGATION  1981    Allergies  Allergies  Allergen Reactions   Morphine And Related Other (See Comments)    Feels like bugs crawling over her   Nsaids Hives   Tape Other (See Comments)    Blisters with tape after surgical procedures   Tapentadol Other (See Comments)    Blisters with tape after surgical procedures   Levothyroxine     Other  reaction(s): hot flashes,    Morphine Sulfate Hives   Naproxen Sodium    Nitrofurantoin Hives   Tramadol     Other reaction(s): Pt reports drug interaction with Paxil   Tyloxapol     Other reaction(s): hyper   Buspar [Buspirone] Palpitations   Ciprofloxacin Nausea Only    Upset stomach    Codeine Other (See Comments)    Hyper   Gabapentin Other (See Comments)    Suicidal ideation.   Lodine [Etodolac] Nausea Only    Upset Stomach    Lyrica [Pregabalin] Other (See Comments)    suicidal ideation   Niacin And Related Other (See Comments)    flushing   Pravastatin Other (See Comments)    achiness    Septra [Sulfamethoxazole-Trimethoprim] Other (See Comments)    Stomach pains   Tylox [Oxycodone-Acetaminophen] Other (See Comments)    Hyper   Wellbutrin [Bupropion] Other (See Comments)    dizzy    History of  Present Illness    [name] is a 66 year old female with the above-mentioned past medical history who presents today for 1 year annual follow-up of nonobstructive CAD and RBBB.  Patient had Lexiscan Myoview completed in 2016 that showed no evidence of ischemia and was low risk.  She was last seen by Dr. Tamala Julian in 09/2020 and was seen for preoperative clearance of right total knee replacement.  Coronary CTA was completed 06/2018 demonstrating nonobstructive right coronary disease with less than 50% with coronary calcium score of 0.  Coronary MRI completed with findings of mild noncalcified plaque in the proximal RCA with 25-49% stenosis   Since last being seen in the office patient reports she has been doing well and tolerating medications without any side effects.  She does endorse chest discomfort that she describes as an ache that is not sharp and feels heavy at times.  She denies dizziness or shortness of breath with this discomfort.  She states that she can sometimes palpate the area of discomfort and reproduce the pain.  She is also experiencing a buzzing feeling in her chest that she states last for around 10 minutes and is associated with some lightheadedness.  Patient denies dyspnea, PND, orthopnea, nausea, vomiting, dizziness, syncope, edema, weight gain, or early satiety.   Home Medications    Current Outpatient Medications  Medication Sig Dispense Refill   acetaminophen (TYLENOL) 500 MG tablet Take 500 mg by mouth every 6 (six) hours as needed for moderate pain.     ALPRAZolam (XANAX) 0.5 MG tablet TAKE 1/2 TABLET BY MOUTH THREE TIMES DAILY AS NEEDED FOR ANXIETY 90 tablet 1   amLODipine (NORVASC) 2.5 MG tablet Take 1 tablet (2.5 mg total) by mouth daily. 90 tablet 0   amoxicillin (AMOXIL) 500 MG capsule Take 2,000 mg by mouth See admin instructions. Take 2000 mg by mouth 1 hour prior to dental procedure.     Ascorbic Acid (VITAMIN C) 1000 MG tablet Take 1,000 mg by mouth daily.      aspirin EC 81 MG tablet Take 1 tablet (81 mg total) by mouth daily. Swallow whole. 90 tablet 3   B Complex Vitamins (B COMPLEX PO) Place 1 tablet under the tongue daily.     betamethasone dipropionate 0.05 % cream Apply 1 application topically 2 (two) times daily as needed (irritation).     bisacodyl (DULCOLAX) 5 MG EC tablet Take 5 mg by mouth daily as needed for moderate constipation.     Cholecalciferol (VITAMIN D-3) 5000 UNITS TABS Take  5,000 Units by mouth daily.     ezetimibe (ZETIA) 10 MG tablet Take 10 mg by mouth daily.      fexofenadine (ALLEGRA) 180 MG tablet Take 180 mg by mouth daily as needed for allergies.      fluticasone (FLONASE) 50 MCG/ACT nasal spray Place 1-2 sprays into both nostrils daily as needed for allergies or rhinitis.     HYDROcodone-acetaminophen (NORCO/VICODIN) 5-325 MG tablet Take 1 tablet by mouth every 6 (six) hours as needed.     levothyroxine (SYNTHROID) 150 MCG tablet Take 1 tablet by mouth daily.     Multiple Vitamin (MULTIVITAMIN WITH MINERALS) TABS tablet Take 1 tablet by mouth daily.     PARoxetine (PAXIL) 20 MG tablet TAKE 1 TABLET BY MOUTH IN THE MORNING, AT NOON, AND AT BEDTIME 270 tablet 0   pramipexole (MIRAPEX) 0.25 MG tablet TAKE 1 TABLET BY MOUTH EVERY MORNING AND1 AND 1/2 TABS AT NIGHT 225 tablet 0   propranolol ER (INDERAL LA) 60 MG 24 hr capsule Take 1 capsule (60 mg total) by mouth daily. 90 capsule 0   rosuvastatin (CRESTOR) 5 MG tablet Take 1 tablet (5 mg total) by mouth daily. 90 tablet 0   tirzepatide (MOUNJARO) 12.5 MG/0.5ML Pen Inject 12.5 mg into the skin once a week.     tiZANidine (ZANAFLEX) 4 MG tablet Take 0.5-1 tablets (2-4 mg total) by mouth every 8 (eight) hours as needed for muscle spasms. 30 tablet 0   No current facility-administered medications for this visit.     Review of Systems  Please see the history of present illness.    (+) Mild chest ache (+) Buzzing sensation in chest  All other systems reviewed and are  otherwise negative except as noted above.  Physical Exam    Wt Readings from Last 3 Encounters:  08/19/21 240 lb (108.9 kg)  09/25/20 243 lb (110.2 kg)  09/15/20 243 lb (110.2 kg)   VS: Vitals:   08/19/21 1341  BP: 124/68  Pulse: 74  SpO2: 94%  ,Body mass index is 39.94 kg/m.  Constitutional:      Appearance: Healthy appearance. Not in distress.  Neck:     Vascular: JVD normal.  Pulmonary:     Effort: Pulmonary effort is normal.     Breath sounds: No wheezing. No rales. Diminished in the bases Cardiovascular:     Normal rate. Regular rhythm. Normal S1. Normal S2.      Murmurs: There is no murmur.  Edema:    Peripheral edema absent.  Abdominal:     Palpations: Abdomen is soft non tender. There is no hepatomegaly.  Skin:    General: Skin is warm and dry.  Neurological:     General: No focal deficit present.     Mental Status: Alert and oriented to person, place and time.     Cranial Nerves: Cranial nerves are intact.  EKG/LABS/Other Studies Reviewed    ECG personally reviewed by me today -normal sinus rhythm with left axis deviation and right bundle branch block with rate of 74 and no acute changes   Lab Results  Component Value Date   WBC 10.0 09/26/2020   HGB 11.8 (L) 09/26/2020   HCT 35.3 (L) 09/26/2020   MCV 99.7 09/26/2020   PLT 263 09/26/2020   Lab Results  Component Value Date   CREATININE 0.75 09/15/2020   BUN 23 09/15/2020   NA 141 09/15/2020   K 4.1 09/15/2020   CL 107 09/15/2020   CO2 27  09/15/2020   Lab Results  Component Value Date   ALT 13 10/27/2020   AST 21 10/27/2020   ALKPHOS 79 10/27/2020   BILITOT 0.6 10/27/2020   Lab Results  Component Value Date   CHOL 161 10/27/2020   HDL 64 10/27/2020   LDLCALC 78 10/27/2020   TRIG 103 10/27/2020   CHOLHDL 2.5 10/27/2020    Lab Results  Component Value Date   HGBA1C 5.1 09/15/2020    Assessment & Plan    1.  Primary hypertension: -Blood pressure today was well controlled at  122/68 -Patient reports no headaches or indiscretions with sodium. -Patient will continue to monitor blood pressures at home   2. Aortic atherosclerosis: -Patient currently on statin therapy and Zetia 10 mg as noted below -Patient is no longer on Eliquis 2.5 mg since knee replacement procedure. -We will restart baby aspirin 81 mg today   3.  Hypercholesteremia: -Patients last LDL was 83 above goal of less than 70 -We will increase Crestor 5 mg to 7 days a week and have lipids and LFTs rechecked in 6 to 8 weeks -Patient advised to contact office if she feels any myalgia pain with increase. -Continue Zetia 10 mg daily   4.  Morbid obesity: -BMI is 39.94 kg/m -Patient encouraged to increase physical activity as tolerated  5.  Chest pain of unknown etiology/palpitations: -Patient endorses an ache in her chest that she describes as feeling heavy at times and also has an associated buzzing sensation that lasts for 2 minutes. -She denies any exertional discomfort and states that pain develops at rest. -We will add amlodipine 2.5 mg for chest pain -ZIO monitor for 1 week to evaluate buzzing sensation and dizziness with standing  6.  Incomplete RBBB: -Blood pressure well controlled as noted above and patient currently on statin therapy with ASA 81 mg  Disposition: Follow-up with Belva Crome III, MD or APP in 6-8 weeks    Medication Adjustments/Labs and Tests Ordered: Current medicines are reviewed at length with the patient today.  Concerns regarding medicines are outlined above.   Signed, Mable Fill, Marissa Nestle, NP 08/19/2021, 2:24 PM [place]

## 2021-08-19 ENCOUNTER — Ambulatory Visit: Payer: Medicare PPO | Admitting: Nurse Practitioner

## 2021-08-19 ENCOUNTER — Ambulatory Visit (INDEPENDENT_AMBULATORY_CARE_PROVIDER_SITE_OTHER): Payer: Medicare PPO

## 2021-08-19 ENCOUNTER — Encounter: Payer: Self-pay | Admitting: Nurse Practitioner

## 2021-08-19 VITALS — BP 124/68 | HR 74 | Ht 65.0 in | Wt 240.0 lb

## 2021-08-19 DIAGNOSIS — I700 Atherosclerosis of aorta: Secondary | ICD-10-CM

## 2021-08-19 DIAGNOSIS — R079 Chest pain, unspecified: Secondary | ICD-10-CM

## 2021-08-19 DIAGNOSIS — E7800 Pure hypercholesterolemia, unspecified: Secondary | ICD-10-CM

## 2021-08-19 DIAGNOSIS — R002 Palpitations: Secondary | ICD-10-CM

## 2021-08-19 DIAGNOSIS — I452 Bifascicular block: Secondary | ICD-10-CM

## 2021-08-19 DIAGNOSIS — I10 Essential (primary) hypertension: Secondary | ICD-10-CM | POA: Diagnosis not present

## 2021-08-19 MED ORDER — AMLODIPINE BESYLATE 2.5 MG PO TABS: 2.5000 mg | ORAL_TABLET | Freq: Every day | ORAL | 0 refills | Status: DC

## 2021-08-19 MED ORDER — ASPIRIN 81 MG PO TBEC: 81.0000 mg | DELAYED_RELEASE_TABLET | Freq: Every day | ORAL | 3 refills | Status: DC

## 2021-08-19 MED ORDER — ROSUVASTATIN CALCIUM 5 MG PO TABS: 5.0000 mg | ORAL_TABLET | Freq: Every day | ORAL | 0 refills | Status: DC

## 2021-08-19 NOTE — Patient Instructions (Signed)
Medication Instructions:  TAKE ROSUVASTATIN 5 MG EVERY DAY  START AMLODIPINE 2.5 MG EVERY DAY  START EC ASPIRIN 81 MG EVERY DAY *If you need a refill on your cardiac medications before your next appointment, please call your pharmacy*   Lab Work: 6 - 8 WEEKS FASTING LIPID LIVER  DUE THE WEEK OF THE 9TH OF OCTOBER  If you have labs (blood work) drawn today and your tests are completely normal, you will receive your results only by: [place] (if you have MyChart) OR A paper copy in the mail If you have any lab test that is abnormal or we need to change your treatment, we will call you to review the results.   Testing/Procedures: Your physician has recommended that you wear an event monitor. Event monitors are medical devices that record the heart's electrical activity. Doctors most often [place] these monitors to diagnose arrhythmias. Arrhythmias are problems with the speed or rhythm of the heartbeat. The monitor is a small, portable device. You can wear one while you do your normal daily activities. This is usually used to diagnose what is causing palpitations/syncope (passing out). ZIO PATCH  X 7 DAYS    Follow-Up: At [facility], you and your health needs are our priority.  As part of our continuing mission to provide you with exceptional heart care, we have created designated Provider Care Teams.  These Care Teams include your primary Cardiologist (physician) and Advanced Practice Providers (APPs -  Physician Assistants and Nurse Practitioners) who all work together to provide you with the care you need, when you need it.  We recommend signing up for the patient portal called "MyChart".  Sign up information is provided on this After Visit Summary.  MyChart is used to connect with patients for Virtual Visits (Telemedicine).  Patients are able to view lab/test results, encounter notes, upcoming appointments, etc.  Non-urgent messages can be sent to your provider as well.   To learn more  about what you can do with MyChart, go to [email or web address].    Your next appointment:   8 week(s)  The format for your next appointment:   In Person  Provider:   Ambrose Pancoast, NP          Other Instructions NONE  Important Information About Sugar

## 2021-08-19 NOTE — Progress Notes (Unsigned)
ZIO XT mailed to pt's home address. 

## 2021-08-20 DIAGNOSIS — M461 Sacroiliitis, not elsewhere classified: Secondary | ICD-10-CM | POA: Diagnosis not present

## 2021-08-23 DIAGNOSIS — R002 Palpitations: Secondary | ICD-10-CM | POA: Diagnosis not present

## 2021-08-24 ENCOUNTER — Ambulatory Visit: Payer: Medicare PPO | Admitting: Orthopaedic Surgery

## 2021-08-28 ENCOUNTER — Other Ambulatory Visit: Payer: Self-pay | Admitting: Psychiatry

## 2021-08-28 DIAGNOSIS — F411 Generalized anxiety disorder: Secondary | ICD-10-CM

## 2021-08-28 DIAGNOSIS — F341 Dysthymic disorder: Secondary | ICD-10-CM

## 2021-08-28 DIAGNOSIS — F3340 Major depressive disorder, recurrent, in remission, unspecified: Secondary | ICD-10-CM

## 2021-08-28 DIAGNOSIS — F4010 Social phobia, unspecified: Secondary | ICD-10-CM

## 2021-08-28 DIAGNOSIS — F429 Obsessive-compulsive disorder, unspecified: Secondary | ICD-10-CM

## 2021-09-01 DIAGNOSIS — E039 Hypothyroidism, unspecified: Secondary | ICD-10-CM | POA: Diagnosis not present

## 2021-09-01 DIAGNOSIS — R202 Paresthesia of skin: Secondary | ICD-10-CM | POA: Diagnosis not present

## 2021-09-07 ENCOUNTER — Ambulatory Visit
Admission: RE | Admit: 2021-09-07 | Discharge: 2021-09-07 | Disposition: A | Discharge status: Home / Self Care | Payer: Medicare PPO | Source: Ambulatory Visit | Attending: Family Medicine | Admitting: Family Medicine

## 2021-09-07 DIAGNOSIS — Z1231 Encounter for screening mammogram for malignant neoplasm of breast: Secondary | ICD-10-CM

## 2021-09-09 DIAGNOSIS — M461 Sacroiliitis, not elsewhere classified: Secondary | ICD-10-CM | POA: Diagnosis not present

## 2021-09-13 DIAGNOSIS — Z961 Presence of intraocular lens: Secondary | ICD-10-CM | POA: Diagnosis not present

## 2021-09-13 DIAGNOSIS — H5203 Hypermetropia, bilateral: Secondary | ICD-10-CM | POA: Diagnosis not present

## 2021-09-27 DIAGNOSIS — G5601 Carpal tunnel syndrome, right upper limb: Secondary | ICD-10-CM | POA: Diagnosis not present

## 2021-09-27 DIAGNOSIS — G5623 Lesion of ulnar nerve, bilateral upper limbs: Secondary | ICD-10-CM | POA: Diagnosis not present

## 2021-10-01 NOTE — Telephone Encounter (Signed)
This encounter was created in error - please disregard.

## 2021-10-05 ENCOUNTER — Encounter: Payer: Self-pay | Admitting: Psychiatry

## 2021-10-05 ENCOUNTER — Ambulatory Visit (INDEPENDENT_AMBULATORY_CARE_PROVIDER_SITE_OTHER): Payer: Medicare PPO | Admitting: Psychiatry

## 2021-10-05 DIAGNOSIS — E039 Hypothyroidism, unspecified: Secondary | ICD-10-CM | POA: Diagnosis not present

## 2021-10-05 DIAGNOSIS — G2581 Restless legs syndrome: Secondary | ICD-10-CM | POA: Diagnosis not present

## 2021-10-05 DIAGNOSIS — F341 Dysthymic disorder: Secondary | ICD-10-CM | POA: Diagnosis not present

## 2021-10-05 DIAGNOSIS — F3342 Major depressive disorder, recurrent, in full remission: Secondary | ICD-10-CM | POA: Diagnosis not present

## 2021-10-05 DIAGNOSIS — F4010 Social phobia, unspecified: Secondary | ICD-10-CM | POA: Diagnosis not present

## 2021-10-05 DIAGNOSIS — M797 Fibromyalgia: Secondary | ICD-10-CM | POA: Diagnosis not present

## 2021-10-05 DIAGNOSIS — Z23 Encounter for immunization: Secondary | ICD-10-CM | POA: Diagnosis not present

## 2021-10-05 DIAGNOSIS — R202 Paresthesia of skin: Secondary | ICD-10-CM | POA: Diagnosis not present

## 2021-10-05 DIAGNOSIS — F411 Generalized anxiety disorder: Secondary | ICD-10-CM

## 2021-10-05 DIAGNOSIS — Z0000 Encounter for general adult medical examination without abnormal findings: Secondary | ICD-10-CM | POA: Diagnosis not present

## 2021-10-05 DIAGNOSIS — R7303 Prediabetes: Secondary | ICD-10-CM | POA: Diagnosis not present

## 2021-10-05 DIAGNOSIS — R002 Palpitations: Secondary | ICD-10-CM | POA: Diagnosis not present

## 2021-10-05 DIAGNOSIS — F3340 Major depressive disorder, recurrent, in remission, unspecified: Secondary | ICD-10-CM | POA: Diagnosis not present

## 2021-10-05 DIAGNOSIS — E7800 Pure hypercholesterolemia, unspecified: Secondary | ICD-10-CM | POA: Diagnosis not present

## 2021-10-05 DIAGNOSIS — F4001 Agoraphobia with panic disorder: Secondary | ICD-10-CM

## 2021-10-05 DIAGNOSIS — F429 Obsessive-compulsive disorder, unspecified: Secondary | ICD-10-CM

## 2021-10-05 DIAGNOSIS — M8949 Other hypertrophic osteoarthropathy, multiple sites: Secondary | ICD-10-CM | POA: Diagnosis not present

## 2021-10-05 MED ORDER — PAROXETINE HCL 20 MG PO TABS: ORAL_TABLET | ORAL | 0 refills | Status: DC

## 2021-10-05 MED ORDER — ALPRAZOLAM 0.25 MG PO TABS: 0.2500 mg | ORAL_TABLET | Freq: Four times a day (QID) | ORAL | 2 refills | Status: DC | PRN

## 2021-10-05 NOTE — Progress Notes (Signed)
[name] [id number] 08/07/1955 66 y.o.  Virtual Visit via [place]  I connected with pt by WebEx and verified that I am speaking with the correct person using two identifiers.   I discussed the limitations, risks, security and privacy concerns of performing an evaluation and management service by Jack[facility]ul[facility] and the availability of in person appointments. I also discussed with the patient that there may be a patient responsible charge related to this service. The patient expressed understanding and agreed to proceed.  I discussed the assessment and treatment plan with the patient. The patient was provided an opportunity to ask questions and all were answered. The patient agreed with the plan and demonstrated an understanding of the instructions.   The patient was advised to call back or seek an in-person evaluation if the symptoms worsen or if the condition fails to improve as anticipated.  I provided 30 minutes of video time during this encounter. The patient was located at home and the provider was located office.  Session 1040-1110   Subjective:   Patient ID:  [name] is a 66 y.o. (DOB 27-Dec-1955) female.  Chief Complaint:  Chief Complaint  Patient presents with   Follow-up    Obsessive-compulsive disorder - scrupulosity type   Anxiety   Depression    HPI [name] presents for follow-up of treatment resistant OCD, major depression, and restless leg syndrome.  seen May 2019.  She had accidentally reduced her paroxetine from 60 mg to 40 mg and it was increased back to the appropriate 60 mg dose.  She never increased the dosage.  seen May 2020.  She continued on paroxetine 40 mg a day.  Her anxiety level was high.  We discussed increasing it back to 60 mg a day which she had taken in the past.  She indicated a willingness to do so.  This had been discussed at the previous appointment as well but she never made the change.   As of Feb 2021, more  anxiety.  September fusions back surgery.  Created lots of anxiety.   More scary thoughts and more OCD.  Covid triggers obsessive fears about the end of times biblically.   Didn't increase paroxetine bc afraid she will lose options for future fear of worsening options.  "I'm saving it."   More down over the back surgery and immobility.  Depression was OK otherwise.  Been doing pretty good for the most part, but more anxious lately.  Nervous facing knee and possible back surgery.  Also working on obsessions with Dr. Rica Mote.  She wonders about increasing the Paxil.  She never increased the Paxil to 60 mg daily.  Disc pros and cons of this.  Needing more Xanax but it's not a high dosage.. Plan: increase in paroxetine to 60 mg daily.  03/26/2020 appointment with following noted: Unusually patient has not been seen in over a year. Pretty good for the most part.  Still has triggers for OCD like yesterday memorial service but can pull herself out of it.Still has the OCD.  Easier to dismiss the obsessions and anxiety is better. Increase paroxetine helped her.  Recognizes benefit from the meds.  Stopped counseling and feels OK about it.  Much better than a year ago. Takes Xanax but not lately.  At least 2 weeks. Has tremor off and on and can affect handwriting.  Sisters & mother have tremor also. Chronic back pain and accepted it.  Taking propranolol ER 60 for anxiety.  10/05/2021 appt  noted: Reduced paroxetine to 20 BID for 6 mos or so.  Just trying to do with less.  No other reason. Anxiety is an issue but she felt it was not related to paroxeitine reduction. Shaking triggered by hyperthyroidism has made her a nervous wreck and this triggers fear of PD.  Reduced thyroid med 4 weeks ago without resolution. Taking increased Xanax 0.25 mg BID and increased propranolol to 80 mg daily.  Patient reports stable mood and denies depressed or irritable moods.  She has a history of severe depression but that is  under control.  Patient denies difficulty with sleep initiation or maintenance. Denies appetite disturbance.  Patient reports that energy and motivation have been good.  Patient denies any difficulty with concentration.  Patient denies any suicidal ideation.  Past Psychiatric Medication Trials: Paroxetine, venlafaxine, clomipramine, Lexapro 40 no response, sertraline, duloxetine, fluvoxamine,   Wellbutrin 300 panic, Viibryd, Fetzima,  Abilify, Risperdal, Geodon 40 twice daily side effects, Seroquel, Latuda, Rexulti, Saphris,  N-acetylcysteine, Deplin, clonidine, pindolol, gabapentin SI, lamotrigine, buspirone, pramipexole, Xanax  Review of Systems:  Review of Systems  Cardiovascular:  Negative for palpitations.  Musculoskeletal:  Positive for back pain, gait problem and joint swelling.  Neurological:  Positive for tremors. Negative for weakness.  Psychiatric/Behavioral:  The patient is nervous/anxious.     Medications: I have reviewed the patient's current medications.  Current Outpatient Medications  Medication Sig Dispense Refill   acetaminophen (TYLENOL) 500 MG tablet Take 500 mg by mouth every 6 (six) hours as needed for moderate pain.     ALPRAZolam (XANAX) 0.5 MG tablet TAKE 1/2 TABLET BY MOUTH THREE TIMES DAILY AS NEEDED FOR ANXIETY (Patient taking differently: PRN) 90 tablet 1   amLODipine (NORVASC) 2.5 MG tablet Take 1 tablet (2.5 mg total) by mouth daily. 90 tablet 0   amoxicillin (AMOXIL) 500 MG capsule Take 2,000 mg by mouth See admin instructions. Take 2000 mg by mouth 1 hour prior to dental procedure.     Ascorbic Acid (VITAMIN C) 1000 MG tablet Take 1,000 mg by mouth daily.     aspirin EC 81 MG tablet Take 1 tablet (81 mg total) by mouth daily. Swallow whole. 90 tablet 3   B Complex Vitamins (B COMPLEX PO) Place 1 tablet under the tongue daily.     betamethasone dipropionate 0.05 % cream Apply 1 application topically 2 (two) times daily as needed (irritation).     bisacodyl  (DULCOLAX) 5 MG EC tablet Take 5 mg by mouth daily as needed for moderate constipation.     Cholecalciferol (VITAMIN D-3) 5000 UNITS TABS Take 5,000 Units by mouth daily.     ezetimibe (ZETIA) 10 MG tablet Take 10 mg by mouth daily.      fexofenadine (ALLEGRA) 180 MG tablet Take 180 mg by mouth daily as needed for allergies.      fluticasone (FLONASE) 50 MCG/ACT nasal spray Place 1-2 sprays into both nostrils daily as needed for allergies or rhinitis.     HYDROcodone-acetaminophen (NORCO/VICODIN) 5-325 MG tablet Take 1 tablet by mouth every 6 (six) hours as needed.     levothyroxine (SYNTHROID) 125 MCG tablet Take 1 tablet by mouth daily.     Multiple Vitamin (MULTIVITAMIN WITH MINERALS) TABS tablet Take 1 tablet by mouth daily.     PARoxetine (PAXIL) 20 MG tablet TAKE 1 TABLET BY MOUTH IN THE MORNING ATNOON AND AT BEDTIME (Patient taking differently: BID) 270 tablet 0   pramipexole (MIRAPEX) 0.25 MG tablet TAKE 1 TABLET BY  MOUTH EVERY MORNING AND1 AND 1/2 TABS AT NIGHT 225 tablet 0   propranolol ER (INDERAL LA) 60 MG 24 hr capsule TAKE 1 CAPSULE BY MOUTH ONCE DAILY (Patient taking differently: Take 80 mg by mouth daily.) 90 capsule 0   rosuvastatin (CRESTOR) 5 MG tablet Take 1 tablet (5 mg total) by mouth daily. 90 tablet 0   tirzepatide (MOUNJARO) 12.5 MG/0.5ML Pen Inject 12.5 mg into the skin once a week.     tiZANidine (ZANAFLEX) 4 MG tablet Take 0.5-1 tablets (2-4 mg total) by mouth every 8 (eight) hours as needed for muscle spasms. 30 tablet 0   No current facility-administered medications for this visit.    Medication Side Effects: None  Allergies:  Allergies  Allergen Reactions   Morphine And Related Other (See Comments)    Feels like bugs crawling over her   Nsaids Hives   Tape Other (See Comments)    Blisters with tape after surgical procedures   Tapentadol Other (See Comments)    Blisters with tape after surgical procedures   Levothyroxine     Other reaction(s): hot flashes,     Morphine Sulfate Hives   Naproxen Sodium    Nitrofurantoin Hives   Tramadol     Other reaction(s): Pt reports drug interaction with Paxil   Tyloxapol     Other reaction(s): hyper   Buspar [Buspirone] Palpitations   Ciprofloxacin Nausea Only    Upset stomach    Codeine Other (See Comments)    Hyper   Gabapentin Other (See Comments)    Suicidal ideation.   Lodine [Etodolac] Nausea Only    Upset Stomach    Lyrica [Pregabalin] Other (See Comments)    suicidal ideation   Niacin And Related Other (See Comments)    flushing   Pravastatin Other (See Comments)    achiness    Septra [Sulfamethoxazole-Trimethoprim] Other (See Comments)    Stomach pains   Tylox [Oxycodone-Acetaminophen] Other (See Comments)    Hyper   Wellbutrin [Bupropion] Other (See Comments)    dizzy    Past Medical History:  Diagnosis Date   Anginal pain ([place])    "on and off" (11/01/2011) - anxiety related   Anxiety    takes Paxil daily and Xanax prn   Arthritis    "knees" (10/16/2013)   Chronic back pain    facet disease and bulging disc; "thoracic and lower back" (10/16/2013)   Constipation    r/t pain meds;takes stool softener daily   Depression    Dysrhythmia    rapid HR on occasion-takes Propranolol daily   Family history of anesthesia complication    ":PONV; mom and sisters"   History of bladder infections    > 4yrago   History of kidney stones    Hyperlipidemia    takes LIpitor daily   Hypothyroidism    takes Synthroid daily   Insomnia    Joint pain    Joint swelling    Muscle spasms of head or neck    lumbar and thoracic;takes Robaxin prn   Peripheral neuropathy    PONV (postoperative nausea and vomiting)    Rosacea    Seasonal allergies    takes allegra prn   Sleep apnea     Family History  Problem Relation Age of Onset   Heart disease Mother 773  Heart failure Mother    Heart attack Father    Heart disease Father 566  Breast cancer Sister  has had it 2 times     Social History   Socioeconomic History   Marital status: Married    Spouse name: Not on file   Number of children: Not on file   Years of education: Not on file   Highest education level: Not on file  Occupational History   Not on file  Tobacco Use   Smoking status: Never   Smokeless tobacco: Never  Vaping Use   Vaping Use: Never used  Substance and Sexual Activity   Alcohol use: No   Drug use: No   Sexual activity: Yes    Birth control/protection: Surgical  Other Topics Concern   Not on file  Social History Narrative   Not on file   Social Determinants of Health   Financial Resource Strain: Not on file  Food Insecurity: Not on file  Transportation Needs: Not on file  Physical Activity: Not on file  Stress: Not on file  Social Connections: Not on file  Intimate Partner Violence: Not on file    Past Medical History, Surgical history, Social history, and Family history were reviewed and updated as appropriate.   Please see review of systems for further details on the patient's review from today.   Objective:   Physical Exam:  There were no vitals taken for this visit.  Physical Exam Neurological:     Mental Status: She is alert and oriented to person, place, and time.     Cranial Nerves: No dysarthria.  Psychiatric:        Attention and Perception: Attention and perception normal.        Mood and Affect: Mood is anxious.        Speech: Speech normal.        Behavior: Behavior is cooperative.        Thought Content: Thought content normal. Thought content is not paranoid or delusional. Thought content does not include homicidal or suicidal ideation. Thought content does not include suicidal plan.        Cognition and Memory: Cognition and memory normal.        Judgment: Judgment normal.     Comments: Insight intact     Lab Review:     Component Value Date/Time   NA 141 09/15/2020 1500   NA 141 06/15/2018 1411   K 4.1 09/15/2020 1500   CL 107  09/15/2020 1500   CO2 27 09/15/2020 1500   GLUCOSE 96 09/15/2020 1500   BUN 23 09/15/2020 1500   BUN 13 06/15/2018 1411   CREATININE 0.75 09/15/2020 1500   CALCIUM 9.1 09/15/2020 1500   PROT 6.7 10/27/2020 0852   ALBUMIN 4.3 10/27/2020 0852   AST 21 10/27/2020 0852   ALT 13 10/27/2020 0852   ALKPHOS 79 10/27/2020 0852   BILITOT 0.6 10/27/2020 0852   GFRNONAA >60 09/15/2020 1500   GFRAA >60 10/16/2018 0558       Component Value Date/Time   WBC 10.0 09/26/2020 0301   RBC 3.54 (L) 09/26/2020 0301   HGB 11.8 (L) 09/26/2020 0301   HCT 35.3 (L) 09/26/2020 0301   PLT 263 09/26/2020 0301   MCV 99.7 09/26/2020 0301   MCH 33.3 09/26/2020 0301   MCHC 33.4 09/26/2020 0301   RDW 13.4 09/26/2020 0301   LYMPHSABS 2,217 04/21/2020 1643   MONOABS 0.5 10/08/2013 1050   EOSABS 129 04/21/2020 1643   BASOSABS 48 04/21/2020 1643    No results found for: "POCLITH", "LITHIUM"   No results found for: "PHENYTOIN", "PHENOBARB", "  VALPROATE", "CBMZ"   .res Assessment: Plan:    Obsessive-compulsive disorder - scrupulosity type  Generalized anxiety disorder  Panic disorder with agoraphobia  Social anxiety disorder  Recurrent major depression in complete remission (HCC)  Restless legs syndrome   Greater than 50% of 30 min of non face to face time with patient was spent on counseling and coordination of care. We discussed She overall has been satisfied with the meds in spite of being on a relatively low dose of paroxetine 40 mg given her history of treatment resistant OCD.  She is making progress in therapy that is helping.  She asks about possibly increasing the paroxetine back to 60 mg which she is taken before given the additional stressors of pending surgery.  Obviously I think that is a reasonable thing to do.     Can temporarily increase Xanax 0.25 mg TID - QID until hyperthyroidism resolved.  Propranolol is also helping her anxiety and she's satisfied but wants to increase for her  tremor.  Her restless legs is managed as long as she takes the pramipexole.   Explained nature of way SSRI work for anxiety and how higher dose can improve her anxiety over months and possibly longer.  Big advantage to increasing the dose.  Discussed this in depth including side effects. Defer  but consider  increase in paroxetine to 60 mg daily. Pt historically  needs high dosage bc of TR OCD.  This is an appropriate dose for OCD and typical and tolerated.  Follow-up 6 months  Lynder Parents, MD, DFAPA.  Future Appointments  Date Time Provider [place]  10/06/2021  8:30 AM [facility]  10/13/2021  1:30 PM Barbarann Ehlers Junius Creamer., NP [facility]    No orders of the defined types were placed in this encounter.     -------------------------------

## 2021-10-06 ENCOUNTER — Other Ambulatory Visit: Payer: Self-pay | Admitting: *Deleted

## 2021-10-06 ENCOUNTER — Ambulatory Visit: Payer: Medicare PPO | Attending: Nurse Practitioner

## 2021-10-06 DIAGNOSIS — E7800 Pure hypercholesterolemia, unspecified: Secondary | ICD-10-CM | POA: Diagnosis not present

## 2021-10-06 LAB — HEPATIC FUNCTION PANEL
ALT: 21 IU/L (ref 0–32)
AST: 20 IU/L (ref 0–40)
Albumin: 3.9 g/dL (ref 3.9–4.9)
Alkaline Phosphatase: 61 IU/L (ref 44–121)
Bilirubin Total: 0.4 mg/dL (ref 0.0–1.2)
Bilirubin, Direct: 0.16 mg/dL (ref 0.00–0.40)
Total Protein: 6.5 g/dL (ref 6.0–8.5)

## 2021-10-08 DIAGNOSIS — E7800 Pure hypercholesterolemia, unspecified: Secondary | ICD-10-CM

## 2021-10-11 ENCOUNTER — Other Ambulatory Visit (HOSPITAL_COMMUNITY): Payer: Self-pay | Admitting: Nurse Practitioner

## 2021-10-11 ENCOUNTER — Ambulatory Visit: Payer: Medicare PPO | Attending: Internal Medicine

## 2021-10-11 DIAGNOSIS — M461 Sacroiliitis, not elsewhere classified: Secondary | ICD-10-CM | POA: Diagnosis not present

## 2021-10-11 DIAGNOSIS — E7800 Pure hypercholesterolemia, unspecified: Secondary | ICD-10-CM | POA: Diagnosis not present

## 2021-10-11 DIAGNOSIS — Z6841 Body Mass Index (BMI) 40.0 and over, adult: Secondary | ICD-10-CM | POA: Diagnosis not present

## 2021-10-11 NOTE — Progress Notes (Addendum)
Office Visit    Patient Name: [name] Date of Encounter: 10/13/2021  Primary Care Provider:  Shirline Frees, MD Primary Cardiologist:  Sinclair Grooms, MD Primary Electrophysiologist: None  Chief Complaint    [name] is a 66 y.o. female with PMH of HLD, DM type II,  RBBB, obesity, nonobstructive coronary disease presents today for follow-up of CAD and RBBB.  Past Medical History    Past Medical History:  Diagnosis Date   Anginal pain ([place])    "on and off" (11/01/2011) - anxiety related   Anxiety    takes Paxil daily and Xanax prn   Arthritis    "knees" (10/16/2013)   Chronic back pain    facet disease and bulging disc; "thoracic and lower back" (10/16/2013)   Constipation    r/t pain meds;takes stool softener daily   Depression    Dysrhythmia    rapid HR on occasion-takes Propranolol daily   Family history of anesthesia complication    ":PONV; mom and sisters"   History of bladder infections    > 72yr[age]   History of Rich stones    Hyperlipidemia    takes LIpitor daily   Hypothyroidism    takes Synthroid daily   Insomnia    Joint pain    Joint swelling    Muscle spasms of head or neck    lumbar and thoracic;takes Robaxin prn   Peripheral neuropathy    PONV (postoperative nausea and vomiting)    Rosacea    Seasonal allergies    takes allegra prn   Sleep apnea    Past Surgical History:  Procedure Laterality Date   ABDOMINAL HYSTERECTOMY  1980's   ANAL FISSURE REPAIR  ~ 2011   "banded hemorrhoids at this time too"   BACK SURGERY  10/20/2018   lumbar fusion L4-5, L5-S1   BARIATRIC SURGERY     [place]ARDIAC CATHETERIZATION  [place]   & ulnar nerve release   CERVICAL DISC SURGERY  03/2017   C5, C6    COLONOSCOPY     ESOPHAGOGASTRODUODENOSCOPY     KNEE ARTHROPLASTY Left 2007   KNEE ARTHROSCOPY Right    KNEE ARTHROSCOPY Right    "torn meniscus"   KNEE SURGERY Left 1982-2013   "17  before 10/31/2013"   LEFT OOPHORECTOMY  1980's   PATELLECTOMY Right    PLANTAR FASCIA SURGERY Bilateral 1990's   REVISION TOTAL KNEE ARTHROPLASTY Left 11/01/2011   TOTAL KNEE ARTHROPLASTY Right 10/15/2013   Procedure: RIGHT TOTAL KNEE ARTHROPLASTY;  Surgeon: PGarald Balding MD;  [facility] [facility]  Service: Orthopedics;  Laterality: Right;   TOTAL KNEE REVISION  11/01/2011   Procedure: TOTAL KNEE REVISION;  Surgeon: PGarald Balding MD;  [facility]  Service: Orthopedics;  Laterality: Left;  Revision Left Total Knee Replacement    TOTAL KNEE REVISION Right 09/25/2020   Procedure: RIGHT POLY LINER EXCHANGE;  Surgeon: BMcarthur Rossetti MD;  Location: [facility];  Service: Orthopedics;  Laterality: Right;   TUBAL LIGATION  1981    Allergies  Allergies  Allergen Reactions   Morphine And Related Other (See Comments)    Feels like bugs crawling over her   Nsaids Hives   Tape Other (See Comments)    Blisters with tape after surgical procedures   Tapentadol Other (See Comments)    Blisters with tape after surgical procedures   Levothyroxine     Other reaction(s): hot  flashes,    Morphine Sulfate Hives   Naproxen Sodium    Nitrofurantoin Hives   Tramadol     Other reaction(s): Pt reports drug interaction with Paxil   Tyloxapol     Other reaction(s): hyper   Buspar [Buspirone] Palpitations   Ciprofloxacin Nausea Only    Upset stomach    Codeine Other (See Comments)    Hyper   Gabapentin Other (See Comments)    Suicidal ideation.   Lodine [Etodolac] Nausea Only    Upset Stomach    Lyrica [Pregabalin] Other (See Comments)    suicidal ideation   Niacin And Related Other (See Comments)    flushing   Pravastatin Other (See Comments)    achiness    Septra [Sulfamethoxazole-Trimethoprim] Other (See Comments)    Stomach pains   Tylox [Oxycodone-Acetaminophen] Other (See Comments)    Hyper   Wellbutrin [Bupropion] Other (See Comments)    dizzy    History of Present  Illness    [name] is a 66 year old female with the above-mentioned past medical history who presents today for 1 year annual follow-up of nonobstructive CAD and RBBB.  Patient had Lexiscan Myoview completed in 2016 that showed no evidence of ischemia and was low risk.  She was last seen by Dr. Tamala Julian in 09/2020 and was seen for preoperative clearance of right total knee replacement.  Coronary CTA was completed 06/2018 demonstrating nonobstructive right coronary disease with less than 50% with coronary calcium score of 0.  Coronary MRI completed with findings of mild noncalcified plaque in the proximal RCA with 25-49% stenosis.  She was seen for follow-up on 08/2021.  During visit patient reported she had been experiencing chest discomfort and a buzzing feeling in her chest.  She was given 7-day ZIO monitor for further evaluation.  We also endorsed her Crestor to 7 days a week due to elevated LDL.  [name] presents today for follow-up of complaint of chest discomfort.  Since last being seen in the office patient reports she has been doing well but still endorses some occasional buzzing and chest discomfort.  She reports that these feelings are not associated with any palpitations or dizziness.  She is compliant with her current medication regimen and reports no current side effects.  Her blood pressure is currently well controlled at 118/66.  She reports that her PCP is titrating and changing her Synthroid currently due to elevated TSH.  She is scheduled to have back surgery in the next 2 months.  Patient denies chest pain, palpitations, dyspnea, PND, orthopnea, nausea, vomiting, dizziness, syncope, edema, weight gain, or early satiety.  Home Medications    Current Outpatient Medications  Medication Sig Dispense Refill   acetaminophen (TYLENOL) 500 MG tablet Take 500 mg by mouth every 6 (six) hours as needed for moderate pain.     ALPRAZolam (XANAX) 0.25 MG tablet Take 1 tablet (0.25 mg  total) by mouth 4 (four) times daily as needed for anxiety. 120 tablet 2   amoxicillin (AMOXIL) 500 MG capsule Take 2,000 mg by mouth See admin instructions. Take 2000 mg by mouth 1 hour prior to dental procedure.     Ascorbic Acid (VITAMIN C) 1000 MG tablet Take 1,000 mg by mouth daily.     aspirin EC 81 MG tablet Take 1 tablet (81 mg total) by mouth daily. Swallow whole. 90 tablet 3   B Complex Vitamins (B COMPLEX PO) Place 1 tablet under the tongue daily.     betamethasone dipropionate 0.05 %  cream Apply 1 application topically 2 (two) times daily as needed (irritation).     bisacodyl (DULCOLAX) 5 MG EC tablet Take 5 mg by mouth daily as needed for moderate constipation.     Cholecalciferol (VITAMIN D-3) 5000 UNITS TABS Take 5,000 Units by mouth daily.     ezetimibe (ZETIA) 10 MG tablet Take 10 mg by mouth daily.      fexofenadine (ALLEGRA) 180 MG tablet Take 180 mg by mouth daily as needed for allergies.      fluticasone (FLONASE) 50 MCG/ACT nasal spray Place 1-2 sprays into both nostrils daily as needed for allergies or rhinitis.     HYDROcodone-acetaminophen (NORCO/VICODIN) 5-325 MG tablet Take 1 tablet by mouth every 6 (six) hours as needed.     Levothyroxine Sodium 100 MCG CAPS Take 1 tablet by mouth daily.     methocarbamol (ROBAXIN) 500 MG tablet Take 500 mg by mouth as needed.     Multiple Vitamin (MULTIVITAMIN WITH MINERALS) TABS tablet Take 1 tablet by mouth daily.     PARoxetine (PAXIL) 20 MG tablet TAKE 1 TABLET BY MOUTH IN THE MORNING ATNOON AND AT BEDTIME 270 tablet 0   pramipexole (MIRAPEX) 0.25 MG tablet TAKE 1 TABLET BY MOUTH EVERY MORNING AND1 AND 1/2 TABS AT NIGHT 225 tablet 0   propranolol ER (INDERAL LA) 60 MG 24 hr capsule TAKE 1 CAPSULE BY MOUTH ONCE DAILY (Patient taking differently: Take 80 mg by mouth daily.) 90 capsule 0   rosuvastatin (CRESTOR) 5 MG tablet Take 1 tablet (5 mg total) by mouth daily. 90 tablet 0   tirzepatide (MOUNJARO) 12.5 MG/0.5ML Pen Inject 12.5 mg  into the skin once a week.     No current facility-administered medications for this visit.     Review of Systems  Please see the history of present illness.    (+) Chest discomfort   All other systems reviewed and are otherwise negative except as noted above.  Physical Exam    Wt Readings from Last 3 Encounters:  10/13/21 243 lb 12.8 oz (110.6 kg)  08/19/21 240 lb (108.9 kg)  09/25/20 243 lb (110.2 kg)   VS: Vitals:   10/13/21 1337  BP: 118/66  Pulse: 76  SpO2: 97%  ,Body mass index is 40.57 kg/m.  Constitutional:      Appearance: Healthy appearance. Not in distress.  Neck:     Vascular: JVD normal.  Pulmonary:     Effort: Pulmonary effort is normal.     Breath sounds: No wheezing. No rales. Diminished in the bases Cardiovascular:     Normal rate. Regular rhythm. Normal S1. Normal S2.      Murmurs: There is no murmur.  Edema:    Peripheral edema absent.  Abdominal:     Palpations: Abdomen is soft non tender. There is no hepatomegaly.  Skin:    General: Skin is warm and dry.  Neurological:     General: No focal deficit present.     Mental Status: Alert and oriented to person, place and time.     Cranial Nerves: Cranial nerves are intact.  EKG/LABS/Other Studies Reviewed    ECG personally reviewed by me today -none completed today  Lab Results  Component Value Date   WBC 10.0 09/26/2020   HGB 11.8 (L) 09/26/2020   HCT 35.3 (L) 09/26/2020   MCV 99.7 09/26/2020   PLT 263 09/26/2020   Lab Results  Component Value Date   CREATININE 0.75 09/15/2020   BUN 23 09/15/2020  NA 141 09/15/2020   K 4.1 09/15/2020   CL 107 09/15/2020   CO2 27 09/15/2020   Lab Results  Component Value Date   ALT 21 10/06/2021   AST 20 10/06/2021   ALKPHOS 61 10/06/2021   BILITOT 0.4 10/06/2021   Lab Results  Component Value Date   CHOL 151 10/11/2021   HDL 56 10/11/2021   LDLCALC 77 10/11/2021   TRIG 95 10/11/2021   CHOLHDL 2.7 10/11/2021    Lab Results  Component  Value Date   HGBA1C 5.1 09/15/2020    Assessment & Plan    1.  Primary hypertension: -Blood pressure today was well controlled at 118/66 -Patient reports no headaches or indiscretions with sodium. -She reports that she did not start amlodipine due to concerns about medications.  I advised her that it was okay to discontinue medication since she has not started.   2. Aortic atherosclerosis: -Patient currently on statin therapy and Zetia 10 mg as noted below -Patient is no longer on Eliquis 2.5 mg since knee replacement procedure. -We will restart baby aspirin 81 mg today   3.  Hypercholesteremia: -Patient's current LDL cholesterol was 77 and liver enzymes were WNL -Continue Zetia 10 mg daily   4.  Morbid obesity: -BMI is 39.94 kg/m -Patient encouraged to increase physical activity as tolerated   5.  Chest pain of unknown etiology/palpitations: -Patient had ZIO monitor completed that showed no new ischemia and was considered low risk. -Today patient still reports chest discomfort and with her strong family history of CAD we [facility]. -She is also scheduled to undergo S1 back fusion in the next few months and Myoview will help with restratification for surgery. -She was advised to continue current medications and go to the ED if chest discomfort worsens.  6.  Preoperative clearance:  Patient had Lexiscan Myoview completed that revealed no ischemia or infarct.  Patient is cleared to proceed with scheduled back surgery.     The patient affirms she has been doing well without any new cardiac symptoms. They are able to achieve 5 METS without cardiac limitations. Therefore, based on ACC/AHA guidelines, the patient would be at acceptable risk for the planned procedure without further cardiovascular testing. The patient was advised that if she develops new symptoms prior to surgery to contact our office to arrange for a follow-up visit, and she verbalized  understanding.  [name] perioperative risk of a major cardiac event is  % according to the Revised Cardiac Risk Index (RCRI).  Therefore, she is at low risk for perioperative complications.   Her functional capacity is fair at 4.4 METs according to the Duke Activity Status Index (DASI). Recommendations: According to ACC/AHA guidelines, no further cardiovascular testing needed.  The patient may proceed to surgery at acceptable risk.   Antiplatelet and/or Anticoagulation Recommendations -    Disposition: Follow-up with Sinclair Grooms, MD or APP in 12 months Shared Decision Making/Informed Consent The risks [chest pain, shortness of breath, cardiac arrhythmias, dizziness, blood pressure fluctuations, myocardial infarction, stroke/transient ischemic attack, nausea, vomiting, allergic reaction, radiation exposure, metallic taste sensation and life-threatening complications (estimated to be 1 in 10,000)], benefits (risk stratification, diagnosing coronary artery disease, treatment guidance) and alternatives of a nuclear stress test were discussed in detail with [name] and she agrees to proceed.   Medication Adjustments/Labs and Tests Ordered: Current medicines are reviewed at length with the patient today.  Concerns regarding medicines are outlined above.   Signed,  Mable Fill, Marissa Nestle, NP 10/13/2021, 4:19 PM [facility]  Note:  This document was prepared using Dragon voice recognition software and may include unintentional dictation errors.

## 2021-10-12 LAB — LIPID PANEL
Chol/HDL Ratio: 2.7 ratio (ref 0.0–4.4)
Cholesterol, Total: 151 mg/dL (ref 100–199)
HDL: 56 mg/dL (ref >39)
LDL Chol Calc (NIH): 77 mg/dL (ref 0–99)
Triglycerides: 95 mg/dL (ref 0–149)
VLDL Cholesterol Cal: 18 mg/dL (ref 5–40)

## 2021-10-13 ENCOUNTER — Encounter: Payer: Self-pay | Admitting: Nurse Practitioner

## 2021-10-13 ENCOUNTER — Ambulatory Visit: Payer: Medicare PPO | Attending: Nurse Practitioner | Admitting: Nurse Practitioner

## 2021-10-13 VITALS — BP 118/66 | HR 76 | Ht 65.0 in | Wt 243.8 lb

## 2021-10-13 DIAGNOSIS — R079 Chest pain, unspecified: Secondary | ICD-10-CM | POA: Diagnosis not present

## 2021-10-13 DIAGNOSIS — I10 Essential (primary) hypertension: Secondary | ICD-10-CM | POA: Diagnosis not present

## 2021-10-13 DIAGNOSIS — I700 Atherosclerosis of aorta: Secondary | ICD-10-CM

## 2021-10-13 DIAGNOSIS — E7800 Pure hypercholesterolemia, unspecified: Secondary | ICD-10-CM | POA: Diagnosis not present

## 2021-10-13 DIAGNOSIS — Z01810 Encounter for preprocedural cardiovascular examination: Secondary | ICD-10-CM

## 2021-10-13 NOTE — Patient Instructions (Signed)
Medication Instructions:   Your physician recommends that you continue on your current medications as directed. Please refer to the Current Medication list given to you today.   *If you need a refill on your cardiac medications before your next appointment, please call your pharmacy*   Lab Work:  None ordered.  If you have labs (blood work) drawn today and your tests are completely normal, you will receive your results only by: [place] (if you have MyChart) OR A paper copy in the mail If you have any lab test that is abnormal or we need to change your treatment, we will call you to review the results.   Testing/Procedures:  You are scheduled for a Myocardial Perfusion Imaging Study on Thursday, October 12 at 8:15 am.   Please arrive 15 minutes prior to your appointment time for registration and insurance purposes.   The test will take approximately 3 to 4 hours to complete; you may bring reading material. If someone comes with you to your appointment, they will need to remain in the main lobby due to limited space in the testing area.   How to prepare for your Myocardial Perfusion test:   Do not eat or drink 3 hours prior to your test, except you may have water.    Do not consume products containing caffeine (regular or decaffeinated) 12 hours prior to your test (ex: coffee, chocolate, soda, tea)   Do bring a list of your current medications with you. If not listed below, you may take your medications as normal.   Bring any held medication to your appointment, as you may be required to take it once the test is complete.   Do wear comfortable clothes (no dresses ) and walking shoes. Tennis shoes are preferred. No heels or open toed shoes.  Do not wear  perfume or lotions (deodorant is allowed).   If these instructions are not followed, you test will have to be rescheduled.   Please report to [address] for your test. If you have questions or  concerns about your appointment, please call the Nuclear Lab at [phone].  If you cannot keep your appointment, please provide 24 hour notification to the Nuclear lab to avoid a possible $50 charge to your account.       Follow-Up: At [facility], you and your health needs are our priority.  As part of our continuing mission to provide you with exceptional heart care, we have created designated Provider Care Teams.  These Care Teams include your primary Cardiologist (physician) and Advanced Practice Providers (APPs -  Physician Assistants and Nurse Practitioners) who all work together to provide you with the care you need, when you need it.  We recommend signing up for the patient portal called "MyChart".  Sign up information is provided on this After Visit Summary.  MyChart is used to connect with patients for Virtual Visits (Telemedicine).  Patients are able to view lab/test results, encounter notes, upcoming appointments, etc.  Non-urgent messages can be sent to your provider as well.   To learn more about what you can do with MyChart, go to [email or web address].    Your next appointment:   1 year(s)  The format for your next appointment:   In Person  Provider:   Ambrose Pancoast, NP         Other Instructions  Your physician wants you to follow-up in: 1 year with Madaline Guthrie. You will receive a reminder letter in the  mail two months in advance. If you don't receive a letter, please call our office to schedule the follow-up appointment.   Important Information About Sugar

## 2021-10-14 ENCOUNTER — Ambulatory Visit (HOSPITAL_COMMUNITY): Payer: Medicare PPO | Attending: Internal Medicine

## 2021-10-14 DIAGNOSIS — R079 Chest pain, unspecified: Secondary | ICD-10-CM | POA: Insufficient documentation

## 2021-10-14 MED ORDER — REGADENOSON 0.4 MG/5ML IV SOLN
0.4000 mg | Freq: Once | INTRAVENOUS | Status: AC
  Administered 2021-10-14: 0.4 mg via INTRAVENOUS

## 2021-10-14 MED ORDER — TECHNETIUM TC 99M TETROFOSMIN IV KIT
33.0000 | PACK | Freq: Once | INTRAVENOUS | Status: AC | PRN
  Administered 2021-10-14: 33 via INTRAVENOUS

## 2021-10-15 ENCOUNTER — Ambulatory Visit (HOSPITAL_COMMUNITY): Payer: Medicare PPO | Attending: Cardiology

## 2021-10-15 ENCOUNTER — Other Ambulatory Visit: Payer: Self-pay | Admitting: Family Medicine

## 2021-10-15 DIAGNOSIS — R202 Paresthesia of skin: Secondary | ICD-10-CM

## 2021-10-15 LAB — MYOCARDIAL PERFUSION IMAGING
LV dias vol: 72 mL (ref 46–106)
LV sys vol: 24 mL
Nuc Stress EF: 66 %
Peak HR: 93 {beats}/min
Rest HR: 75 {beats}/min
Rest Nuclear Isotope Dose: 31.6 mCi
SDS: 0
SRS: 0
SSS: 0
ST Depression (mm): 0 mm
Stress Nuclear Isotope Dose: 33 mCi
TID: 1.1

## 2021-10-15 MED ORDER — TECHNETIUM TC 99M TETROFOSMIN IV KIT
31.6000 | PACK | Freq: Once | INTRAVENOUS | Status: AC | PRN
  Administered 2021-10-15: 31.6 via INTRAVENOUS

## 2021-10-20 DIAGNOSIS — Z9884 Bariatric surgery status: Secondary | ICD-10-CM | POA: Diagnosis not present

## 2021-10-20 DIAGNOSIS — Z6839 Body mass index (BMI) 39.0-39.9, adult: Secondary | ICD-10-CM | POA: Diagnosis not present

## 2021-10-20 DIAGNOSIS — Z79899 Other long term (current) drug therapy: Secondary | ICD-10-CM | POA: Diagnosis not present

## 2021-10-21 DIAGNOSIS — M461 Sacroiliitis, not elsewhere classified: Secondary | ICD-10-CM | POA: Diagnosis not present

## 2021-11-03 ENCOUNTER — Ambulatory Visit
Admission: RE | Admit: 2021-11-03 | Discharge: 2021-11-03 | Disposition: A | Discharge status: Home / Self Care | Payer: Medicare PPO | Source: Ambulatory Visit | Attending: Family Medicine | Admitting: Family Medicine

## 2021-11-03 DIAGNOSIS — R202 Paresthesia of skin: Secondary | ICD-10-CM

## 2021-11-03 DIAGNOSIS — M50222 Other cervical disc displacement at C5-C6 level: Secondary | ICD-10-CM | POA: Diagnosis not present

## 2021-11-03 MED ORDER — GADOPICLENOL 0.5 MMOL/ML IV SOLN
10.0000 mL | Freq: Once | INTRAVENOUS | Status: AC | PRN
  Administered 2021-11-03: 10 mL via INTRAVENOUS

## 2021-11-09 DIAGNOSIS — M461 Sacroiliitis, not elsewhere classified: Secondary | ICD-10-CM | POA: Diagnosis not present

## 2021-11-09 DIAGNOSIS — Z6841 Body Mass Index (BMI) 40.0 and over, adult: Secondary | ICD-10-CM | POA: Diagnosis not present

## 2021-11-26 ENCOUNTER — Other Ambulatory Visit: Payer: Self-pay | Admitting: Psychiatry

## 2021-11-26 ENCOUNTER — Other Ambulatory Visit: Payer: Self-pay | Admitting: Nurse Practitioner

## 2021-11-27 ENCOUNTER — Other Ambulatory Visit: Payer: Self-pay | Admitting: Psychiatry

## 2021-11-27 DIAGNOSIS — G2581 Restless legs syndrome: Secondary | ICD-10-CM

## 2021-11-30 ENCOUNTER — Telehealth: Payer: Self-pay | Admitting: Psychiatry

## 2021-11-30 DIAGNOSIS — M461 Sacroiliitis, not elsewhere classified: Secondary | ICD-10-CM | POA: Diagnosis not present

## 2021-11-30 DIAGNOSIS — M180 Bilateral primary osteoarthritis of first carpometacarpal joints: Secondary | ICD-10-CM | POA: Diagnosis not present

## 2021-11-30 DIAGNOSIS — M79641 Pain in right hand: Secondary | ICD-10-CM | POA: Diagnosis not present

## 2021-11-30 DIAGNOSIS — M79642 Pain in left hand: Secondary | ICD-10-CM | POA: Diagnosis not present

## 2021-11-30 DIAGNOSIS — M7712 Lateral epicondylitis, left elbow: Secondary | ICD-10-CM | POA: Diagnosis not present

## 2021-11-30 DIAGNOSIS — M5416 Radiculopathy, lumbar region: Secondary | ICD-10-CM | POA: Diagnosis not present

## 2021-11-30 NOTE — Telephone Encounter (Signed)
Pharmacy needs clarification on Propranolol dose. S/B 60 mg or 80 mg? [phone]

## 2021-11-30 NOTE — Telephone Encounter (Signed)
Ok to DC 60 mg propranolol and take the 80 mg RX by PCP

## 2021-11-30 NOTE — Telephone Encounter (Signed)
Pharmacy called to verify propranolol dose.  Last visit from 10/3 you say 60 mg, but says she prefers higher dose due to tremor. Pharmacy said Dr. Shirline Frees (patient's PCP) sent in Rx for 80 mg. She said her thyroid was, and still is, out of whack, and that is why the higher dose prescribed.  Please advise what her dose should be and which provider should prescribe it.

## 2021-12-01 NOTE — Telephone Encounter (Signed)
Notified pharmacy that they could fill the 80 mg dose and cancel 60 mg.

## 2021-12-15 DIAGNOSIS — M461 Sacroiliitis, not elsewhere classified: Secondary | ICD-10-CM | POA: Diagnosis not present

## 2022-01-03 DIAGNOSIS — U071 COVID-19: Secondary | ICD-10-CM

## 2022-01-03 HISTORY — DX: COVID-19: U07.1

## 2022-01-10 ENCOUNTER — Other Ambulatory Visit: Payer: Self-pay | Admitting: Neurological Surgery

## 2022-01-11 ENCOUNTER — Other Ambulatory Visit: Payer: Self-pay | Admitting: Neurological Surgery

## 2022-01-17 DIAGNOSIS — M461 Sacroiliitis, not elsewhere classified: Secondary | ICD-10-CM | POA: Diagnosis not present

## 2022-01-17 NOTE — Progress Notes (Signed)
Upon contacting the pr to go over pre-op instructions for her surgery tomorrow, she expressed that she does not feel well. She is endorsing fevers, weakness, and body aches. She states she took a home covid test, which was negative. She sates she left a message for Dr. Rubbie Battiest office to reschedule her surgery.

## 2022-01-17 NOTE — Progress Notes (Signed)
Message left for Dr. Rubbie Battiest scheduler Janett Billow to inform her of the pt needing to reschedule her surgery due to her illness.

## 2022-01-18 DIAGNOSIS — R051 Acute cough: Secondary | ICD-10-CM | POA: Diagnosis not present

## 2022-01-18 DIAGNOSIS — U071 COVID-19: Secondary | ICD-10-CM | POA: Diagnosis not present

## 2022-01-18 DIAGNOSIS — J069 Acute upper respiratory infection, unspecified: Secondary | ICD-10-CM | POA: Diagnosis not present

## 2022-01-18 DIAGNOSIS — R490 Dysphonia: Secondary | ICD-10-CM | POA: Diagnosis not present

## 2022-01-18 DIAGNOSIS — J029 Acute pharyngitis, unspecified: Secondary | ICD-10-CM | POA: Diagnosis not present

## 2022-01-18 DIAGNOSIS — R0981 Nasal congestion: Secondary | ICD-10-CM | POA: Diagnosis not present

## 2022-01-28 NOTE — Anesthesia Preprocedure Evaluation (Signed)
Anesthesia Evaluation  Patient identified by MRN, date of birth, ID band Patient awake    Reviewed: Allergy & Precautions, H&P , NPO status , Patient's Chart, lab work & pertinent test results  History of Anesthesia Complications (+) PONV and history of anesthetic complications  Airway Mallampati: II   Neck ROM: full    Dental   Pulmonary sleep apnea    breath sounds clear to auscultation       Cardiovascular hypertension, + dysrhythmias  Rhythm:regular Rate:Normal     Neuro/Psych  PSYCHIATRIC DISORDERS Anxiety Depression     Neuromuscular disease    GI/Hepatic hiatal hernia,,,  Endo/Other  Hypothyroidism    Renal/GU      Musculoskeletal  (+) Arthritis ,    Abdominal   Peds  Hematology   Anesthesia Other Findings   Reproductive/Obstetrics                             Anesthesia Physical Anesthesia Plan  ASA: 3  Anesthesia Plan: General   Post-op Pain Management:    Induction: Intravenous  PONV Risk Score and Plan: 4 or greater and Ondansetron, Dexamethasone, Midazolam and Treatment may vary due to age or medical condition  Airway Management Planned: Oral ETT  Additional Equipment:   Intra-op Plan:   Post-operative Plan: Extubation in OR  Informed Consent: I have reviewed the patients History and Physical, chart, labs and discussed the procedure including the risks, benefits and alternatives for the proposed anesthesia with the patient or authorized representative who has indicated his/her understanding and acceptance.     Dental advisory given  Plan Discussed with: CRNA, Anesthesiologist and Surgeon  Anesthesia Plan Comments: (PAT note by Karoline Caldwell, PA-C: Follows with cardiology for history of HTN, RBBB, HLD, chronic noncardiac chest pain.  Last seen by Ambrose Pancoast, NP 10/13/2021.  At that time nuclear stress was ordered for restratification in light of upcoming surgery.   Stress test done 10/15/2021 was low risk, nonischemic.  Patient was subsequently cleared to proceed with back surgery.  Patient is on GLP-1 agonist Mounjaro for weight loss.  Per med rec, this is on hold for upcoming procedure.  History of C6-7 disc arthroplasty.  Patient will need day of surgery labs and evaluation.  EKG 08/19/2021: Normal sinus rhythm.  74.  Left axis deviation.  Right bundle branch block.  Nuclear stress 10/15/2021:  The study is normal. The study is low risk.  No ST deviation was noted.  LV perfusion is normal. There is no evidence of ischemia. There is no evidence of infarction.  Left ventricular function is normal. Nuclear stress EF: 66 %. The left ventricular ejection fraction is hyperdynamic (>65%). End diastolic cavity size is normal. End systolic cavity size is normal.  Prior study available for comparison from 09/20/2014.  1. Fixed inferior perfusion defect with normal wall motion, consistent with artifact 2. Low risk study  Event monitor 08/19/2021:  Basic underlying rhythm is normal sinus with bundle branch block and average heart rate 74 bpm.  8 supraventricular tachycardia runs with the longest lasting 11 beats at 103 bpm and fastest lasting 5 beats at 182 bpm.  Low burden PVCs.  No atrial fibrillation.  Coronary CT 06/19/2018: IMPRESSION: 1. Coronary calcium score of 0. This was 0 percentile for age and sex matched control.  2. Normal coronary origin with right dominance.  3. Mild noncalcified plaque in the proximal RCA with 25-49% stenosis.  )  Anesthesia Quick Evaluation  

## 2022-01-28 NOTE — Progress Notes (Signed)
Anesthesia Chart Review: Same day workup  Follows with cardiology for history of HTN, RBBB, HLD, chronic noncardiac chest pain.  Last seen by Ambrose Pancoast, NP 10/13/2021.  At that time nuclear stress was ordered for restratification in light of upcoming surgery.  Stress test done 10/15/2021 was low risk, nonischemic.  Patient was subsequently cleared to proceed with back surgery.  Patient is on GLP-1 agonist Mounjaro for weight loss.  Per med rec, this is on hold for upcoming procedure.  History of C6-7 disc arthroplasty.  Patient will need day of surgery labs and evaluation.  EKG 08/19/2021: Normal sinus rhythm.  74.  Left axis deviation.  Right bundle branch block.  Nuclear stress 10/15/2021:   The study is normal. The study is low risk.   No ST deviation was noted.   LV perfusion is normal. There is no evidence of ischemia. There is no evidence of infarction.   Left ventricular function is normal. Nuclear stress EF: 66 %. The left ventricular ejection fraction is hyperdynamic (>65%). End diastolic cavity size is normal. End systolic cavity size is normal.   Prior study available for comparison from 09/20/2014.   Fixed inferior perfusion defect with normal wall motion, consistent with artifact Low risk study  Event monitor 08/19/2021:   Basic underlying rhythm is normal sinus with bundle branch block and average heart rate 74 bpm.   8 supraventricular tachycardia runs with the longest lasting 11 beats at 103 bpm and fastest lasting 5 beats at 182 bpm.   Low burden PVCs.   No atrial fibrillation.  Coronary CT 06/19/2018: IMPRESSION: 1. Coronary calcium score of 0. This was 0 percentile for age and sex matched control.   2. Normal coronary origin with right dominance.   3. Mild noncalcified plaque in the proximal RCA with 25-49% stenosis.    Wynonia Musty [facility] Short Stay Center/Anesthesiology Phone [phone] 01/28/2022 12:32 PM

## 2022-01-31 ENCOUNTER — Encounter (HOSPITAL_COMMUNITY): Payer: Self-pay | Admitting: Neurological Surgery

## 2022-01-31 ENCOUNTER — Other Ambulatory Visit: Payer: Self-pay

## 2022-01-31 NOTE — Progress Notes (Signed)
Spoke with pt for pre-op call. Pt's surgery was originally scheduled for surgery on 01/18/22 but was sick and it was rescheduled. She states she tested positive for Covid and Flu on 01/19/22. She states she still has a slight cough and clear runny nose.  Pt has hx of a rapid heart rate and takes Inderal for it. She state she is not diabetic. But take Mounjaro for weight loss. Last dose was 01/14/22.   Pre-op instructions given to pt and she voiced understanding.

## 2022-02-01 ENCOUNTER — Ambulatory Visit (HOSPITAL_COMMUNITY): Payer: Medicare PPO | Admitting: Vascular Surgery

## 2022-02-01 ENCOUNTER — Ambulatory Visit (HOSPITAL_COMMUNITY): Payer: Medicare PPO

## 2022-02-01 ENCOUNTER — Encounter (HOSPITAL_COMMUNITY): Payer: Self-pay | Admitting: Neurological Surgery

## 2022-02-01 ENCOUNTER — Ambulatory Visit (HOSPITAL_BASED_OUTPATIENT_CLINIC_OR_DEPARTMENT_OTHER): Payer: Medicare PPO | Admitting: Vascular Surgery

## 2022-02-01 ENCOUNTER — Observation Stay (HOSPITAL_COMMUNITY)
Admission: RE | Admit: 2022-02-01 | Discharge: 2022-02-02 | Disposition: A | Discharge status: Home / Self Care | Payer: Medicare PPO | Attending: Neurological Surgery | Admitting: Neurological Surgery

## 2022-02-01 ENCOUNTER — Encounter (HOSPITAL_COMMUNITY)
Admission: RE | Disposition: A | Discharge status: Home / Self Care | Payer: Self-pay | Source: Home / Self Care | Attending: Neurological Surgery

## 2022-02-01 ENCOUNTER — Other Ambulatory Visit: Payer: Self-pay

## 2022-02-01 DIAGNOSIS — F418 Other specified anxiety disorders: Secondary | ICD-10-CM | POA: Diagnosis not present

## 2022-02-01 DIAGNOSIS — E039 Hypothyroidism, unspecified: Secondary | ICD-10-CM | POA: Insufficient documentation

## 2022-02-01 DIAGNOSIS — Z7982 Long term (current) use of aspirin: Secondary | ICD-10-CM | POA: Insufficient documentation

## 2022-02-01 DIAGNOSIS — Z79899 Other long term (current) drug therapy: Secondary | ICD-10-CM | POA: Insufficient documentation

## 2022-02-01 DIAGNOSIS — I10 Essential (primary) hypertension: Secondary | ICD-10-CM | POA: Diagnosis not present

## 2022-02-01 DIAGNOSIS — M461 Sacroiliitis, not elsewhere classified: Secondary | ICD-10-CM

## 2022-02-01 DIAGNOSIS — G4730 Sleep apnea, unspecified: Secondary | ICD-10-CM

## 2022-02-01 DIAGNOSIS — Z96653 Presence of artificial knee joint, bilateral: Secondary | ICD-10-CM | POA: Insufficient documentation

## 2022-02-01 DIAGNOSIS — M533 Sacrococcygeal disorders, not elsewhere classified: Secondary | ICD-10-CM | POA: Diagnosis not present

## 2022-02-01 DIAGNOSIS — Z8616 Personal history of COVID-19: Secondary | ICD-10-CM | POA: Diagnosis not present

## 2022-02-01 HISTORY — PX: SACROILIAC JOINT FUSION: SHX6088

## 2022-02-01 LAB — CBC
HCT: 39.1 % (ref 36.0–46.0)
Hemoglobin: 12.4 g/dL (ref 12.0–15.0)
MCH: 31.6 pg (ref 26.0–34.0)
MCHC: 31.7 g/dL (ref 30.0–36.0)
MCV: 99.7 fL (ref 80.0–100.0)
Platelets: 338 10*3/uL (ref 150–400)
RBC: 3.92 MIL/uL (ref 3.87–5.11)
RDW: 13.5 % (ref 11.5–15.5)
WBC: 7 10*3/uL (ref 4.0–10.5)
nRBC: 0 % (ref 0.0–0.2)

## 2022-02-01 LAB — BASIC METABOLIC PANEL
Anion gap: 12 (ref 5–15)
BUN: 16 mg/dL (ref 8–23)
CO2: 23 mmol/L (ref 22–32)
Calcium: 9.1 mg/dL (ref 8.9–10.3)
Chloride: 105 mmol/L (ref 98–111)
Creatinine, Ser: 0.89 mg/dL (ref 0.44–1.00)
GFR, Estimated: >60 mL/min (ref >60)
Glucose, Bld: 109 mg/dL — ABNORMAL HIGH (ref 70–99)
Potassium: 3.7 mmol/L (ref 3.5–5.1)
Sodium: 140 mmol/L (ref 135–145)

## 2022-02-01 LAB — SURGICAL PCR SCREEN
MRSA, PCR: NEGATIVE
Staphylococcus aureus: NEGATIVE

## 2022-02-01 LAB — TYPE AND SCREEN
ABO/RH(D): A POS
Antibody Screen: NEGATIVE

## 2022-02-01 SURGERY — SACROILIAC JOINT FUSION
Anesthesia: General | Laterality: Right

## 2022-02-01 MED ORDER — THROMBIN 5000 UNITS EX SOLR
CUTANEOUS | Status: AC
  Filled 2022-02-01: qty 5000

## 2022-02-01 MED ORDER — PHENYLEPHRINE HCL-NACL 20-0.9 MG/250ML-% IV SOLN
INTRAVENOUS | Status: DC | PRN
  Administered 2022-02-01: 25 ug/min via INTRAVENOUS

## 2022-02-01 MED ORDER — PROPOFOL 1000 MG/100ML IV EMUL
INTRAVENOUS | Status: AC
  Filled 2022-02-01: qty 100

## 2022-02-01 MED ORDER — ORAL CARE MOUTH RINSE: 15.0000 mL | Freq: Once | OROMUCOSAL | Status: AC

## 2022-02-01 MED ORDER — DOCUSATE SODIUM 100 MG PO CAPS
100.0000 mg | ORAL_CAPSULE | Freq: Two times a day (BID) | ORAL | Status: DC
  Administered 2022-02-01 – 2022-02-02 (×2): 100 mg via ORAL
  Filled 2022-02-01 (×2): qty 1

## 2022-02-01 MED ORDER — PRAMIPEXOLE DIHYDROCHLORIDE 0.25 MG PO TABS
0.2500 mg | ORAL_TABLET | Freq: Every day | ORAL | Status: DC
  Administered 2022-02-02: 0.25 mg via ORAL
  Filled 2022-02-01: qty 1

## 2022-02-01 MED ORDER — MIDAZOLAM HCL 2 MG/2ML IJ SOLN
INTRAMUSCULAR | Status: DC | PRN
  Administered 2022-02-01: 2 mg via INTRAVENOUS

## 2022-02-01 MED ORDER — ROCURONIUM BROMIDE 10 MG/ML (PF) SYRINGE
PREFILLED_SYRINGE | INTRAVENOUS | Status: DC | PRN
  Administered 2022-02-01: 10 mg via INTRAVENOUS
  Administered 2022-02-01: 60 mg via INTRAVENOUS

## 2022-02-01 MED ORDER — GLYCOPYRROLATE PF 0.2 MG/ML IJ SOSY
PREFILLED_SYRINGE | INTRAMUSCULAR | Status: DC | PRN
  Administered 2022-02-01 (×2): .1 mg via INTRAVENOUS

## 2022-02-01 MED ORDER — LIDOCAINE-EPINEPHRINE 1 %-1:100000 IJ SOLN
INTRAMUSCULAR | Status: AC
  Filled 2022-02-01: qty 1

## 2022-02-01 MED ORDER — SODIUM CHLORIDE 0.9% FLUSH: 3.0000 mL | INTRAVENOUS | Status: DC | PRN

## 2022-02-01 MED ORDER — PAROXETINE HCL 20 MG PO TABS
20.0000 mg | ORAL_TABLET | Freq: Two times a day (BID) | ORAL | Status: DC
  Administered 2022-02-01 – 2022-02-02 (×2): 20 mg via ORAL
  Filled 2022-02-01 (×2): qty 1

## 2022-02-01 MED ORDER — ONDANSETRON HCL 4 MG PO TABS: 4.0000 mg | ORAL_TABLET | Freq: Four times a day (QID) | ORAL | Status: DC | PRN

## 2022-02-01 MED ORDER — PROPRANOLOL HCL ER 80 MG PO CP24
80.0000 mg | ORAL_CAPSULE | Freq: Every day | ORAL | Status: DC
  Administered 2022-02-02: 80 mg via ORAL
  Filled 2022-02-01: qty 1

## 2022-02-01 MED ORDER — LIDOCAINE-EPINEPHRINE 1 %-1:100000 IJ SOLN
INTRAMUSCULAR | Status: DC | PRN
  Administered 2022-02-01: 5 mL

## 2022-02-01 MED ORDER — DEXAMETHASONE SODIUM PHOSPHATE 10 MG/ML IJ SOLN
INTRAMUSCULAR | Status: AC
  Filled 2022-02-01: qty 1

## 2022-02-01 MED ORDER — CEFAZOLIN SODIUM-DEXTROSE 2-4 GM/100ML-% IV SOLN
2.0000 g | Freq: Three times a day (TID) | INTRAVENOUS | Status: AC
  Administered 2022-02-01 (×2): 2 g via INTRAVENOUS
  Filled 2022-02-01 (×2): qty 100

## 2022-02-01 MED ORDER — GLYCOPYRROLATE PF 0.2 MG/ML IJ SOSY
PREFILLED_SYRINGE | INTRAMUSCULAR | Status: AC
  Filled 2022-02-01: qty 1

## 2022-02-01 MED ORDER — CHLORHEXIDINE GLUCONATE CLOTH 2 % EX PADS: 6.0000 | MEDICATED_PAD | Freq: Once | CUTANEOUS | Status: DC

## 2022-02-01 MED ORDER — ROCURONIUM BROMIDE 10 MG/ML (PF) SYRINGE
PREFILLED_SYRINGE | INTRAVENOUS | Status: AC
  Filled 2022-02-01: qty 10

## 2022-02-01 MED ORDER — ACETAMINOPHEN 325 MG PO TABS: 650.0000 mg | ORAL_TABLET | ORAL | Status: DC | PRN

## 2022-02-01 MED ORDER — MIDAZOLAM HCL 2 MG/2ML IJ SOLN
INTRAMUSCULAR | Status: AC
  Filled 2022-02-01: qty 2

## 2022-02-01 MED ORDER — SODIUM CHLORIDE 0.9% FLUSH
3.0000 mL | Freq: Two times a day (BID) | INTRAVENOUS | Status: DC
  Administered 2022-02-01 (×2): 3 mL via INTRAVENOUS

## 2022-02-01 MED ORDER — ROSUVASTATIN CALCIUM 5 MG PO TABS
5.0000 mg | ORAL_TABLET | Freq: Every day | ORAL | Status: DC
  Administered 2022-02-02: 5 mg via ORAL
  Filled 2022-02-01: qty 1

## 2022-02-01 MED ORDER — CHLORHEXIDINE GLUCONATE 0.12 % MT SOLN
15.0000 mL | Freq: Once | OROMUCOSAL | Status: AC
  Administered 2022-02-01: 15 mL via OROMUCOSAL
  Filled 2022-02-01: qty 15

## 2022-02-01 MED ORDER — ONDANSETRON HCL 4 MG/2ML IJ SOLN
INTRAMUSCULAR | Status: DC | PRN
  Administered 2022-02-01: 4 mg via INTRAVENOUS

## 2022-02-01 MED ORDER — PROPOFOL 10 MG/ML IV BOLUS
INTRAVENOUS | Status: DC | PRN
  Administered 2022-02-01: 25 ug/kg/min via INTRAVENOUS
  Administered 2022-02-01: 150 mg via INTRAVENOUS
  Administered 2022-02-01: 30 mg via INTRAVENOUS

## 2022-02-01 MED ORDER — THROMBIN 5000 UNITS EX SOLR
OROMUCOSAL | Status: DC | PRN
  Administered 2022-02-01: 5 mL via TOPICAL

## 2022-02-01 MED ORDER — METHOCARBAMOL 500 MG PO TABS
ORAL_TABLET | ORAL | Status: AC
  Filled 2022-02-01: qty 1

## 2022-02-01 MED ORDER — DEXAMETHASONE SODIUM PHOSPHATE 10 MG/ML IJ SOLN
INTRAMUSCULAR | Status: DC | PRN
  Administered 2022-02-01: 10 mg via INTRAVENOUS

## 2022-02-01 MED ORDER — HYDROCODONE-ACETAMINOPHEN 5-325 MG PO TABS
1.0000 | ORAL_TABLET | ORAL | Status: DC | PRN
  Administered 2022-02-01 – 2022-02-02 (×4): 2 via ORAL
  Filled 2022-02-01 (×4): qty 2

## 2022-02-01 MED ORDER — ACETAMINOPHEN 650 MG RE SUPP: 650.0000 mg | RECTAL | Status: DC | PRN

## 2022-02-01 MED ORDER — PHENOL 1.4 % MT LIQD: 1.0000 | OROMUCOSAL | Status: DC | PRN

## 2022-02-01 MED ORDER — BUPIVACAINE LIPOSOME 1.3 % IJ SUSP
INTRAMUSCULAR | Status: AC
  Filled 2022-02-01: qty 20

## 2022-02-01 MED ORDER — ONDANSETRON HCL 4 MG/2ML IJ SOLN: 4.0000 mg | Freq: Four times a day (QID) | INTRAMUSCULAR | Status: DC | PRN

## 2022-02-01 MED ORDER — EZETIMIBE 10 MG PO TABS
10.0000 mg | ORAL_TABLET | Freq: Every day | ORAL | Status: DC
  Administered 2022-02-02: 10 mg via ORAL
  Filled 2022-02-01: qty 1

## 2022-02-01 MED ORDER — OXYCODONE HCL 5 MG PO TABS: 5.0000 mg | ORAL_TABLET | ORAL | Status: DC | PRN

## 2022-02-01 MED ORDER — PHENYLEPHRINE 80 MCG/ML (10ML) SYRINGE FOR IV PUSH (FOR BLOOD PRESSURE SUPPORT)
PREFILLED_SYRINGE | INTRAVENOUS | Status: AC
  Filled 2022-02-01: qty 10

## 2022-02-01 MED ORDER — FENTANYL CITRATE (PF) 250 MCG/5ML IJ SOLN
INTRAMUSCULAR | Status: DC | PRN
  Administered 2022-02-01 (×3): 50 ug via INTRAVENOUS
  Administered 2022-02-01: 100 ug via INTRAVENOUS

## 2022-02-01 MED ORDER — FENTANYL CITRATE (PF) 250 MCG/5ML IJ SOLN
INTRAMUSCULAR | Status: AC
  Filled 2022-02-01: qty 5

## 2022-02-01 MED ORDER — ONDANSETRON HCL 4 MG/2ML IJ SOLN
INTRAMUSCULAR | Status: AC
  Filled 2022-02-01: qty 2

## 2022-02-01 MED ORDER — HYDROMORPHONE HCL 1 MG/ML IJ SOLN
0.5000 mg | INTRAMUSCULAR | Status: DC | PRN
  Administered 2022-02-01: 0.5 mg via INTRAVENOUS
  Filled 2022-02-01: qty 0.5

## 2022-02-01 MED ORDER — BUPIVACAINE-EPINEPHRINE (PF) 0.5% -1:200000 IJ SOLN
INTRAMUSCULAR | Status: DC | PRN
  Administered 2022-02-01: 5 mL via PERINEURAL

## 2022-02-01 MED ORDER — LIDOCAINE 2% (20 MG/ML) 5 ML SYRINGE
INTRAMUSCULAR | Status: AC
  Filled 2022-02-01: qty 5

## 2022-02-01 MED ORDER — METHOCARBAMOL 1000 MG/10ML IJ SOLN
500.0000 mg | Freq: Four times a day (QID) | INTRAVENOUS | Status: DC | PRN
  Filled 2022-02-01: qty 5

## 2022-02-01 MED ORDER — LACTATED RINGERS IV SOLN: INTRAVENOUS | Status: DC

## 2022-02-01 MED ORDER — EPHEDRINE 5 MG/ML INJ
INTRAVENOUS | Status: AC
  Filled 2022-02-01: qty 5

## 2022-02-01 MED ORDER — BUPIVACAINE LIPOSOME 1.3 % IJ SUSP
INTRAMUSCULAR | Status: DC | PRN
  Administered 2022-02-01: 20 mL

## 2022-02-01 MED ORDER — OXYCODONE HCL 5 MG PO TABS: 10.0000 mg | ORAL_TABLET | ORAL | Status: DC | PRN

## 2022-02-01 MED ORDER — PHENYLEPHRINE 80 MCG/ML (10ML) SYRINGE FOR IV PUSH (FOR BLOOD PRESSURE SUPPORT)
PREFILLED_SYRINGE | INTRAVENOUS | Status: DC | PRN
  Administered 2022-02-01 (×3): 80 ug via INTRAVENOUS

## 2022-02-01 MED ORDER — PROPOFOL 10 MG/ML IV BOLUS
INTRAVENOUS | Status: AC
  Filled 2022-02-01: qty 20

## 2022-02-01 MED ORDER — METHOCARBAMOL 500 MG PO TABS
500.0000 mg | ORAL_TABLET | Freq: Four times a day (QID) | ORAL | Status: DC | PRN
  Administered 2022-02-01 (×3): 500 mg via ORAL
  Filled 2022-02-01 (×3): qty 1

## 2022-02-01 MED ORDER — MENTHOL 3 MG MT LOZG: 1.0000 | LOZENGE | OROMUCOSAL | Status: DC | PRN

## 2022-02-01 MED ORDER — ALPRAZOLAM 0.25 MG PO TABS: 0.2500 mg | ORAL_TABLET | Freq: Four times a day (QID) | ORAL | Status: DC | PRN

## 2022-02-01 MED ORDER — PHENYLEPHRINE HCL-NACL 20-0.9 MG/250ML-% IV SOLN
INTRAVENOUS | Status: AC
  Filled 2022-02-01: qty 750

## 2022-02-01 MED ORDER — BUPIVACAINE-EPINEPHRINE (PF) 0.5% -1:200000 IJ SOLN
INTRAMUSCULAR | Status: AC
  Filled 2022-02-01: qty 30

## 2022-02-01 MED ORDER — LIDOCAINE 2% (20 MG/ML) 5 ML SYRINGE
INTRAMUSCULAR | Status: DC | PRN
  Administered 2022-02-01: 60 mg via INTRAVENOUS

## 2022-02-01 MED ORDER — CEFAZOLIN SODIUM-DEXTROSE 2-4 GM/100ML-% IV SOLN
2.0000 g | INTRAVENOUS | Status: AC
  Administered 2022-02-01: 2 g via INTRAVENOUS
  Filled 2022-02-01: qty 100

## 2022-02-01 MED ORDER — SUGAMMADEX SODIUM 200 MG/2ML IV SOLN
INTRAVENOUS | Status: DC | PRN
  Administered 2022-02-01: 200 mg via INTRAVENOUS

## 2022-02-01 MED ORDER — LEVOTHYROXINE SODIUM 100 MCG PO TABS
100.0000 ug | ORAL_TABLET | Freq: Every day | ORAL | Status: DC
  Administered 2022-02-01 – 2022-02-02 (×2): 100 ug via ORAL
  Filled 2022-02-01 (×2): qty 1

## 2022-02-01 MED ORDER — FENTANYL CITRATE (PF) 100 MCG/2ML IJ SOLN: 25.0000 ug | INTRAMUSCULAR | Status: DC | PRN

## 2022-02-01 MED ORDER — 0.9 % SODIUM CHLORIDE (POUR BTL) OPTIME
TOPICAL | Status: DC | PRN
  Administered 2022-02-01: 1000 mL

## 2022-02-01 MED ORDER — SODIUM CHLORIDE 0.9 % IV SOLN
250.0000 mL | INTRAVENOUS | Status: DC
  Administered 2022-02-01: 250 mL via INTRAVENOUS

## 2022-02-01 MED ORDER — PRAMIPEXOLE DIHYDROCHLORIDE 0.25 MG PO TABS
0.3750 mg | ORAL_TABLET | Freq: Every day | ORAL | Status: DC
  Administered 2022-02-01: 0.25 mg via ORAL
  Filled 2022-02-01: qty 2

## 2022-02-01 SURGICAL SUPPLY — 59 items
BAG COUNTER SPONGE SURGICOUNT (BAG) ×1 IMPLANT
BONE CANC CHIPS 20CC PCAN1/4 (Bone Implant) ×1 IMPLANT
BUR 14 MATCH 3 (BUR) IMPLANT
BUR MR8 14 BALL 5 (BUR) IMPLANT
BURR 14 MATCH 3 (BUR)
BURR MR8 14 BALL 5 (BUR)
CHIPS CANC BONE 20CC PCAN1/4 (Bone Implant) ×1 IMPLANT
COVERAGE SUPPORT O-ARM STEALTH (MISCELLANEOUS) ×1 IMPLANT
DERMABOND ADVANCED .7 DNX12 (GAUZE/BANDAGES/DRESSINGS) ×1 IMPLANT
DEVICE FUSION THRD 40X12 (Joint) IMPLANT
DEVICE FUSION THRD 50X12 (Joint) IMPLANT
DRAIN JACKSON RD 7FR 3/32 (WOUND CARE) IMPLANT
DRAPE C-ARM 42X72 X-RAY (DRAPES) ×1 IMPLANT
DRAPE LAPAROTOMY 100X72X124 (DRAPES) ×1 IMPLANT
DRAPE SHEET LG 3/4 BI-LAMINATE (DRAPES) ×6 IMPLANT
DRAPE SURG 17X23 STRL (DRAPES) ×1 IMPLANT
DRSG OPSITE POSTOP 3X4 (GAUZE/BANDAGES/DRESSINGS) IMPLANT
DURAPREP 26ML APPLICATOR (WOUND CARE) ×1 IMPLANT
ELECT BLADE INSULATED 4IN (ELECTROSURGICAL) ×1
ELECT REM PT RETURN 9FT ADLT (ELECTROSURGICAL) ×1
ELECTRODE BLADE INSULATED 4IN (ELECTROSURGICAL) ×1 IMPLANT
ELECTRODE REM PT RTRN 9FT ADLT (ELECTROSURGICAL) ×1 IMPLANT
FEE COVERAGE SUPPORT O-ARM (MISCELLANEOUS) ×1 IMPLANT
GAUZE 4X4 16PLY ~~LOC~~+RFID DBL (SPONGE) IMPLANT
GAUZE SPONGE 4X4 12PLY STRL (GAUZE/BANDAGES/DRESSINGS) ×1 IMPLANT
GLOVE BIOGEL PI IND STRL 8 (GLOVE) ×1 IMPLANT
GLOVE ECLIPSE 8.0 STRL XLNG CF (GLOVE) ×1 IMPLANT
GLOVE SURG ENC MOIS LTX SZ8 (GLOVE) ×1 IMPLANT
GLOVE SURG UNDER POLY LF SZ8.5 (GLOVE) ×1 IMPLANT
GOWN STRL REUS W/ TWL LRG LVL3 (GOWN DISPOSABLE) IMPLANT
GOWN STRL REUS W/ TWL XL LVL3 (GOWN DISPOSABLE) ×2 IMPLANT
GOWN STRL REUS W/TWL 2XL LVL3 (GOWN DISPOSABLE) IMPLANT
GOWN STRL REUS W/TWL LRG LVL3 (GOWN DISPOSABLE) ×1
GOWN STRL REUS W/TWL XL LVL3 (GOWN DISPOSABLE) ×3
GRAFT BNE CANC CHIPS 1-8 20CC (Bone Implant) IMPLANT
HEMOSTAT POWDER KIT SURGIFOAM (HEMOSTASIS) ×1 IMPLANT
KIT BASIN OR (CUSTOM PROCEDURE TRAY) ×1 IMPLANT
KIT POSITION SURG JACKSON T1 (MISCELLANEOUS) ×1 IMPLANT
KIT TURNOVER KIT B (KITS) ×1 IMPLANT
MARKER SPHERE PSV REFLC NDI (MISCELLANEOUS) ×5 IMPLANT
NDL HYPO 21X1.5 SAFETY (NEEDLE) ×1 IMPLANT
NDL HYPO 25X1 1.5 SAFETY (NEEDLE) ×1 IMPLANT
NEEDLE HYPO 21X1.5 SAFETY (NEEDLE) ×1 IMPLANT
NEEDLE HYPO 25X1 1.5 SAFETY (NEEDLE) ×1 IMPLANT
NS IRRIG 1000ML POUR BTL (IV SOLUTION) ×1 IMPLANT
PACK LAMINECTOMY NEURO (CUSTOM PROCEDURE TRAY) ×1 IMPLANT
PAD ARMBOARD 7.5X6 YLW CONV (MISCELLANEOUS) ×3 IMPLANT
PATTIES SURGICAL .5 X1 (DISPOSABLE) IMPLANT
PIN BONE FIX 150 (PIN) IMPLANT
PUTTY DBF 3CC CORTICAL FIBERS (Putty) IMPLANT
SPIKE FLUID TRANSFER (MISCELLANEOUS) ×1 IMPLANT
SPONGE T-LAP 4X18 ~~LOC~~+RFID (SPONGE) IMPLANT
STAPLER VISISTAT 35W (STAPLE) ×1 IMPLANT
SUT VIC AB 2-0 CP2 18 (SUTURE) ×1 IMPLANT
SUT VIC AB 3-0 SH 8-18 (SUTURE) ×1 IMPLANT
SYR 20CC LL (SYRINGE) ×1 IMPLANT
TOWEL GREEN STERILE (TOWEL DISPOSABLE) IMPLANT
TOWEL GREEN STERILE FF (TOWEL DISPOSABLE) IMPLANT
WATER STERILE IRR 1000ML POUR (IV SOLUTION) ×1 IMPLANT

## 2022-02-01 NOTE — H&P (Signed)
Providing Compassionate, Quality Care - Together  NEUROSURGERY HISTORY & PHYSICAL   [name] is an 67 y.o. female.   Chief Complaint: Right sacroiliitis HPI: This is a pleasant 67 year old female with a history of right sacroiliitis causing significant pain and altering her lifestyle.  This began approximately 1 year after her L4-S1 PLIF in October 2020.  She had failed multiple conservative measures including injections however her pain returned and continued to worsen.  She had multiple positive provocative tests and diagnostic block which confirmed sacroiliac joint to be her pathology.  She presents today for surgical intervention in the form of a right sacroiliac joint fusion.   Past Medical History:  Diagnosis Date   Anginal pain ([place])    "on and off" (11/01/2011) - anxiety related   Anxiety    takes Paxil daily and Xanax prn   Arthritis    "knees" (10/16/2013)   Chronic back pain    facet disease and bulging disc; "thoracic and lower back" (10/16/2013)   Constipation    r/t pain meds;takes stool softener daily   COVID 01/2022   Dysrhythmia    rapid HR on occasion-takes Propranolol daily   Family history of anesthesia complication    ":PONV; mom and sisters"   History of bladder infections    > 46[place][age]   History of kidney stones    Hyperlipidemia    takes LIpitor daily   Hypothyroidism    takes Synthroid daily   Insomnia    Joint pain    Joint swelling    Muscle spasms of head or neck    lumbar and thoracic;takes Robaxin prn   Peripheral neuropathy    PONV (postoperative nausea and vomiting)    Rosacea    Seasonal allergies    takes allegra prn   Sleep apnea     Past Surgical History:  Procedure Laterality Date   ABDOMINAL HYSTERECTOMY  1980's   ANAL FISSURE REPAIR  ~ 2011   "banded hemorrhoids at this time too"   BACK SURGERY  10/20/2018   lumbar fusion L4-5, L5-S1   BARIATRIC SURGERY     [place]   & ulnar nerve release   CERVICAL DISC SURGERY  03/2017   C5, C6    COLONOSCOPY     ESOPHAGOGASTRODUODENOSCOPY     KNEE ARTHROPLASTY Left 2007   KNEE ARTHROSCOPY Right    KNEE ARTHROSCOPY Right    "torn meniscus"   KNEE SURGERY Left 1982-2013   "17 before 10/31/2013"   LEFT OOPHORECTOMY  1980's   PATELLECTOMY Right    PLANTAR FASCIA SURGERY Bilateral 1990's   REVISION TOTAL KNEE ARTHROPLASTY Left 11/01/2011   TOTAL KNEE ARTHROPLASTY Right 10/15/2013   Procedure: RIGHT TOTAL KNEE ARTHROPLASTY;  Surgeon: PGarald Balding MD;  Location: [facility]  Service: Orthopedics;  Laterality: Right;   TOTAL KNEE REVISION  11/01/2011   Procedure: TOTAL KNEE REVISION;  Surgeon: PGarald Balding MD;  Location: [facility]  Service: Orthopedics;  Laterality: Left;  Revision Left Total Knee Replacement    TOTAL KNEE REVISION Right 09/25/2020   Procedure: RIGHT POLY LINER EXCHANGE;  Surgeon: BMcarthur Rossetti MD;  Location: [facility];  Service: Orthopedics;  Laterality: Right;   TUBAL LIGATION  1981    Family History  Problem Relation Age of Onset   Heart disease Mother 736  Heart failure Mother    Heart attack Father  Heart disease Father 74   Breast cancer Sister        has had it 2 times   Social History:  reports that she has never smoked. She has never used smokeless tobacco. She reports that she does not drink alcohol and does not use drugs.  Allergies:  Allergies  Allergen Reactions   Morphine And Related Other (See Comments)    Feels like bugs crawling over her   Nsaids Hives   Tape Other (See Comments)    Blisters with tape after surgical procedures   Tapentadol Other (See Comments)    Blisters with tape after surgical procedures   Levothyroxine     Other reaction(s): hot flashes,    Morphine Sulfate Hives   Naproxen Sodium    Nitrofurantoin Hives   Tramadol     Other reaction(s): Pt reports drug interaction with Paxil   Tyloxapol     Other  reaction(s): hyper   Buspar [Buspirone] Palpitations   Ciprofloxacin Nausea Only    Upset stomach    Codeine Other (See Comments)    Hyper   Gabapentin Other (See Comments)    Suicidal ideation.   Lodine [Etodolac] Nausea Only    Upset Stomach    Lyrica [Pregabalin] Other (See Comments)    suicidal ideation   Niacin And Related Other (See Comments)    flushing   Pravastatin Other (See Comments)    achiness    Septra [Sulfamethoxazole-Trimethoprim] Other (See Comments)    Stomach pains   Tylox [Oxycodone-Acetaminophen] Other (See Comments)    Hyper   Wellbutrin [Bupropion] Other (See Comments)    dizzy    Medications Prior to Admission  Medication Sig Dispense Refill   acetaminophen (TYLENOL) 500 MG tablet Take 500 mg by mouth every 6 (six) hours as needed for moderate pain.     Ascorbic Acid (VITAMIN C) 1000 MG tablet Take 1,000 mg by mouth daily.     aspirin EC 81 MG tablet Take 1 tablet (81 mg total) by mouth daily. Swallow whole. 90 tablet 3   B Complex Vitamins (B COMPLEX PO) Place 1 tablet under the tongue daily.     Cholecalciferol (VITAMIN D-3) 5000 UNITS TABS Take 5,000 Units by mouth daily.     ezetimibe (ZETIA) 10 MG tablet Take 10 mg by mouth daily.      fexofenadine (ALLEGRA) 180 MG tablet Take 180 mg by mouth daily as needed for allergies.      fluticasone (FLONASE) 50 MCG/ACT nasal spray Place 1-2 sprays into both nostrils daily as needed for allergies or rhinitis.     HYDROcodone-acetaminophen (NORCO/VICODIN) 5-325 MG tablet Take 1 tablet by mouth every 6 (six) hours as needed for moderate pain.     Levothyroxine Sodium 100 MCG CAPS Take 100 mcg by mouth daily.     methocarbamol (ROBAXIN) 500 MG tablet Take 500 mg by mouth every 8 (eight) hours as needed for muscle spasms.     Multiple Vitamin (MULTIVITAMIN WITH MINERALS) TABS tablet Take 1 tablet by mouth daily.     PARoxetine (PAXIL) 20 MG tablet TAKE 1 TABLET BY MOUTH IN THE MORNING ATNOON AND AT BEDTIME  (Patient taking differently: Take 20 mg by mouth 2 (two) times daily.) 270 tablet 0   pramipexole (MIRAPEX) 0.25 MG tablet TAKE 1 TABLET BY MOUTH EVERY MORNING AND1 AND 1/2 TABLETS AT NIGHT (Patient taking differently: Take 0.25-0.375 mg by mouth See admin instructions. TAKE 1 TABLET 0.25 mg BY MOUTH EVERY MORNING AND 1 AND  1/2 TABLETS ( 0.375 mg) AT NIGHT) 225 tablet 0   propranolol ER (INDERAL LA) 80 MG 24 hr capsule Take 80 mg by mouth daily.     rosuvastatin (CRESTOR) 5 MG tablet TAKE 1 TABLET BY MOUTH ONCE A DAY 90 tablet 2   ALPRAZolam (XANAX) 0.25 MG tablet Take 1 tablet (0.25 mg total) by mouth 4 (four) times daily as needed for anxiety. 120 tablet 2   amoxicillin (AMOXIL) 500 MG capsule Take 2,000 mg by mouth See admin instructions. Take 2000 mg by mouth 1 hour prior to dental procedure.     betamethasone dipropionate 0.05 % cream Apply 1 application topically 2 (two) times daily as needed (irritation).     MOUNJARO 12.5 MG/0.5ML Pen Inject 12.5 mg into the skin once a week. Friday      Results for orders placed or performed during the hospital encounter of 02/01/22 (from the past 48 hour(s))  Basic metabolic panel per protocol     Status: Abnormal   Collection Time: 02/01/22  5:55 AM  Result Value Ref Range   Sodium 140 135 - 145 mmol/L   Potassium 3.7 3.5 - 5.1 mmol/L   Chloride 105 98 - 111 mmol/L   CO2 23 22 - 32 mmol/L   Glucose, Bld 109 (H) 70 - 99 mg/dL    Comment: Glucose reference range applies only to samples taken after fasting for at least 8 hours.   BUN 16 8 - 23 mg/dL   Creatinine, Ser 0.89 0.44 - 1.00 mg/dL   Calcium 9.1 8.9 - 10.3 mg/dL   GFR, Estimated >60 >60 mL/min    Comment: (NOTE) Calculated using the CKD-EPI Creatinine Equation (2021)    Anion gap 12 5 - 15    Comment: Performed at [facility] [address], [place], [place] [address]  CBC per protocol     Status: None   Collection Time: 02/01/22  5:55 AM  Result Value Ref Range   WBC 7.0 4.0 -  10.5 K/uL   RBC 3.92 3.87 - 5.11 MIL/uL   Hemoglobin 12.4 12.0 - 15.0 g/dL   HCT 39.1 36.0 - 46.0 %   MCV 99.7 80.0 - 100.0 fL   MCH 31.6 26.0 - 34.0 pg   MCHC 31.7 30.0 - 36.0 g/dL   RDW 13.5 11.5 - 15.5 %   Platelets 338 150 - 400 K/uL   nRBC 0.0 0.0 - 0.2 %    Comment: Performed at [facility], [place] [address] [place], [place] [address]  Type and screen     Status: None   Collection Time: 02/01/22  5:58 AM  Result Value Ref Range   ABO/RH(D) A POS    Antibody Screen NEG    Sample Expiration      02/04/2022,2359 Performed at [facility], [place] [address], [place], [place] [address]    No results found.  ROS All pertinent positives and negatives listed HPI above  Blood pressure 137/66, pulse 67, temperature 98.4 F (36.9 C), temperature source Oral, resp. rate 18, height '5\' 5"'$  (1.651 m), weight 113.4 kg, SpO2 100 %. Physical Exam  Awake alert Oriented x 3, no acute distress PERRLA Cranial nerves II through XII intact Bilateral upper/lower extremities 5/5 throughout Nonlabored breathing Sensory intact to light touch Speech fluent and appropriate Positive tenderness over the right SI joint  Assessment/Plan 67 year old female with  Right sacroiliitis   -OR today for right minimally invasive sacroiliac joint fusion.  We  discussed all risks, benefits and expected outcomes as well as alternatives to treatment.  Informed consent was obtained and witnessed.  I answered all of her questions.  Thank you for allowing me to participate in this patient's care.  Please do not hesitate to call with questions or concerns.   Elwin Sleight, [facility] Cell: [phone]

## 2022-02-01 NOTE — Transfer of Care (Signed)
Immediate Anesthesia Transfer of Care Note  Patient: [name]  Procedure(s) Performed: Minimally Invasive Surgery Sacroiliac Fusion ,Right (Right) Application of O-Arm (Right)  Patient Location: PACU  Anesthesia Type:General  Level of Consciousness: awake, alert , and oriented  Airway & Oxygen Therapy: Patient connected to face mask oxygen  Post-op Assessment: Report given to RN and Post -op Vital signs reviewed and stable  Post vital signs: Reviewed and stable  Last Vitals:  Vitals Value Taken Time  BP 143/73 02/01/22 0950  Temp    Pulse 79 02/01/22 0952  Resp 13 02/01/22 0952  SpO2 100 % 02/01/22 0952  Vitals shown include unvalidated device data.  Last Pain:  Vitals:   02/01/22 0641  TempSrc:   PainSc: 5       Patients Stated Pain Goal: 2 (12/50/87 1994)  Complications: There were no known notable events for this encounter.

## 2022-02-01 NOTE — Anesthesia Procedure Notes (Signed)
Procedure Name: Intubation Date/Time: 02/01/2022 7:38 AM  Performed by: Ester Rink, CRNAPre-anesthesia Checklist: Patient identified, Emergency Drugs available, Suction available and Patient being monitored Patient Re-evaluated:Patient Re-evaluated prior to induction Oxygen Delivery Method: Circle system utilized Preoxygenation: Pre-oxygenation with 100% oxygen Induction Type: IV induction Ventilation: Mask ventilation without difficulty Laryngoscope Size: Mac and 4 Grade View: Grade III Tube type: Oral Tube size: 7.0 mm Number of attempts: 1 Airway Equipment and Method: Stylet and Oral airway Placement Confirmation: ETT inserted through vocal cords under direct vision, positive ETCO2 and breath sounds checked- equal and bilateral Secured at: 22 cm Tube secured with: Tape Dental Injury: Teeth and Oropharynx as per pre-operative assessment

## 2022-02-01 NOTE — Op Note (Signed)
Providing Compassionate, Quality Care - Together  Date of service: 02/01/2022  PREOP DIAGNOSIS:  Right sacroiliitis  POSTOP DIAGNOSIS: Same  PROCEDURE: Right minimally invasive sacroiliac arthrodesis, [employer] [place]: 12.5 x 40 mm implant, 12.5 x 50 mm implant Intraoperative use of neuronavigation, Stealth for placement of implants Intraoperative use of O-arm for intraoperative CT Intraoperative use of autograft Intraoperative use of allograft  SURGEON: Dr. Pieter Partridge C. Reese Stockman, DO  ASSISTANT: Dr. Consuella Lose, MD  ANESTHESIA: General Endotracheal  EBL: 25 cc  SPECIMENS: None  DRAINS: None  COMPLICATIONS: None  CONDITION: Hemodynamically stable  HISTORY: [name] is a 67 y.o. female with a history of an L4-S1 decompression, PLIF in 2020 that did very well for a period of years and then began having significant right greater than left sacroiliac pain.  Imaging revealed no evidence of infection or tumor, she had appropriate arthrodesis at L4-S1.  She underwent conservative measures including therapy, and multiple injections.  She responded initially very well to injections however her pain was not controlled long-term with these.  She had many positive provocative SI testing on physical exam, therefore offered her a right sacroiliac joint fusion given her failure of conservative measures.  We discussed all risks, benefits and expected outcomes as well as alternatives to treatment.  Informed consent was obtained and witnessed.  PROCEDURE IN DETAIL: The patient was brought to the operating room. After induction of general anesthesia, the patient was positioned on the operative table in the prone position. All pressure points were meticulously padded. Skin incision was then marked out and prepped and draped in the usual sterile fashion. Physician driven timeout was performed.  Local anesthetic was injected into the planned incisions  Using 10 blade, incision was made  sharply over the left PSIS, using finger dissection down to the periosteum I identified the PSIS.  Percutaneous pin was placed with mallet into the left ilium for neuronavigation and the array was attached.  The field was sterilely draped, the intraoperative CT (O-arm) was brought into the field and images were appropriately obtained of the bilateral SI joints.  The O-arm was removed.  Using neuronavigation, the accuracy was verified to be excellent.  I then planned a trajectory from midline into the right SI joint for decortication and placement of autograft and allograft.  I then planned a superior and inferior trajectory for implants across the ilium into the SI joint with care to not violate the S1 foramen.  Using a 10 blade, midline incision was made sharply, I then used the 10.5 mm tap to decorticate the right SI joint.  I then used the navigated funnel and placed it into the decortication and filled it with autograft and allograft (autograft was saved from the tap).  Hemostasis was achieved with Surgifoam and Ray-Tec.  I then proceeded to place the inferior implant from the ilium into the SI joint under navigation.  Incision was made sharply with a 10 blade, blunt dissection was performed with the 10.5 mm tap down to the ilium.  An appropriate trajectory was performed, with appropriate bony purchase.  I then selected a 12.5 x 40 mm implant, after the implant was filled with allograft.  This was then placed with appropriate bony purchase at the planned trajectory.  This was then repeated superiorly, and I selected a 12.5 x 50 mm implant.  This was placed with appropriate bony purchase and filled with allograft.  Hemostasis was achieved with Surgifoam and Ray-Tec's.  We then sterilely draped the field  and brought the intraoperative O-arm for images.  Post placement of instrumentation images revealed appropriate placement of the SI implants without violation of neural foramina.  The percutaneous navigation pin  was removed and the wound was hemostatic.  Wounds were then closed in layers, dermis was closed with 2-0 and 3-0 Vicryl suture.  Skin was closed with skin glue.  Sterile dressing was applied.  At the end of the case all sponge, needle, and instrument counts were correct. The patient was then transferred to the stretcher, extubated, and taken to the post-anesthesia care unit in stable hemodynamic condition.

## 2022-02-02 ENCOUNTER — Encounter (HOSPITAL_COMMUNITY): Payer: Self-pay | Admitting: Neurological Surgery

## 2022-02-02 DIAGNOSIS — Z7982 Long term (current) use of aspirin: Secondary | ICD-10-CM | POA: Diagnosis not present

## 2022-02-02 DIAGNOSIS — Z8616 Personal history of COVID-19: Secondary | ICD-10-CM | POA: Diagnosis not present

## 2022-02-02 DIAGNOSIS — Z79899 Other long term (current) drug therapy: Secondary | ICD-10-CM | POA: Diagnosis not present

## 2022-02-02 DIAGNOSIS — M461 Sacroiliitis, not elsewhere classified: Secondary | ICD-10-CM | POA: Diagnosis not present

## 2022-02-02 DIAGNOSIS — Z96653 Presence of artificial knee joint, bilateral: Secondary | ICD-10-CM | POA: Diagnosis not present

## 2022-02-02 DIAGNOSIS — E039 Hypothyroidism, unspecified: Secondary | ICD-10-CM | POA: Diagnosis not present

## 2022-02-02 MED ORDER — ASPIRIN 81 MG PO TBEC: 81.0000 mg | DELAYED_RELEASE_TABLET | Freq: Every day | ORAL | 3 refills | Status: AC

## 2022-02-02 MED ORDER — DOCUSATE SODIUM 100 MG PO CAPS: 100.0000 mg | ORAL_CAPSULE | Freq: Two times a day (BID) | ORAL | 0 refills | Status: DC

## 2022-02-02 MED ORDER — HYDROCODONE-ACETAMINOPHEN 5-325 MG PO TABS: 1.0000 | ORAL_TABLET | ORAL | 0 refills | Status: DC | PRN

## 2022-02-02 NOTE — Progress Notes (Signed)
Patient alert and oriented, mae's well, voiding adequate amount of urine, swallowing without difficulty, no c/o pain at time of discharge. Patient discharged home with family. Script and discharged instructions given to patient. Patient and family stated understanding of instructions given. Patient has an appointment with Dr. Reatha Armour

## 2022-02-02 NOTE — Discharge Summary (Addendum)
Physician Discharge Summary  Patient ID: [name] MRN: [id number] DOB/AGE: 67-31-1957 67 y.o.  Admit date: 02/01/2022 Discharge date: 02/02/2022  Admission Diagnoses:  Right sacroiliitis  Discharge Diagnoses:  Same Principal Problem:   Sacroiliitis [facility])   Discharged Condition: Stable  Hospital Course:  Rochanda Harpham is a 67 y.o. female underwent an elective right sacroiliac minimally invasive instrumentation and fusion.  She tolerated the surgery well.  Postoperatively she was monitored overnight, ambulating independently and her pain was controlled on oral medication.  Her hospitalization course was uneventful.  Her incisions were clean dry and intact, having normal bowel bladder function tolerating normal diet.  Treatments: Surgery -right SI fusion  Discharge Exam: Blood pressure 107/64, pulse 67, temperature 98.3 F (36.8 C), temperature source Oral, resp. rate 16, height '5\' 5"'$  (1.651 m), weight 113.4 kg, SpO2 96 %. Awake, alert, oriented Speech fluent, appropriate CN grossly intact 5/5 BUE/BLE Wounds c/d/i  Disposition: Discharge disposition: 01-Home or Self Care       Discharge Instructions     Incentive spirometry RT   Complete by: As directed       Allergies as of 02/02/2022       Reactions   Morphine And Related Other (See Comments)   Feels like bugs crawling over her   Nsaids Hives   Tape Other (See Comments)   Blisters with tape after surgical procedures   Tapentadol Other (See Comments)   Blisters with tape after surgical procedures   Levothyroxine    Other reaction(s): hot flashes,    Morphine Sulfate Hives   Naproxen Sodium    Nitrofurantoin Hives   Tramadol    Other reaction(s): Pt reports drug interaction with Paxil   Tyloxapol    Other reaction(s): hyper   Buspar [buspirone] Palpitations   Ciprofloxacin Nausea Only   Upset stomach   Codeine Other (See Comments)   Hyper   Gabapentin Other (See Comments)    Suicidal ideation.   Lodine [etodolac] Nausea Only   Upset Stomach   Lyrica [pregabalin] Other (See Comments)   suicidal ideation   Niacin And Related Other (See Comments)   flushing   Pravastatin Other (See Comments)   achiness   Septra [sulfamethoxazole-trimethoprim] Other (See Comments)   Stomach pains   Tylox [oxycodone-acetaminophen] Other (See Comments)   Hyper   Wellbutrin [bupropion] Other (See Comments)   dizzy        Medication List     TAKE these medications    acetaminophen 500 MG tablet Commonly known as: TYLENOL Take 500 mg by mouth every 6 (six) hours as needed for moderate pain.   ALPRAZolam 0.25 MG tablet Commonly known as: XANAX Take 1 tablet (0.25 mg total) by mouth 4 (four) times daily as needed for anxiety.   amoxicillin 500 MG capsule Commonly known as: AMOXIL Take 2,000 mg by mouth See admin instructions. Take 2000 mg by mouth 1 hour prior to dental procedure.   aspirin EC 81 MG tablet Take 1 tablet (81 mg total) by mouth daily. Swallow whole. Start taking on: February 09, 2022 What changed: These instructions start on February 09, 2022. If you are unsure what to do until then, ask your doctor or other care provider.   B COMPLEX PO Place 1 tablet under the tongue daily.   betamethasone dipropionate 0.05 % cream Apply 1 application topically 2 (two) times daily as needed (irritation).   docusate sodium 100 MG capsule Commonly known as: COLACE Take 1 capsule (100 mg total) by  mouth 2 (two) times daily.   ezetimibe 10 MG tablet Commonly known as: ZETIA Take 10 mg by mouth daily.   fexofenadine 180 MG tablet Commonly known as: ALLEGRA Take 180 mg by mouth daily as needed for allergies.   fluticasone 50 MCG/ACT nasal spray Commonly known as: FLONASE Place 1-2 sprays into both nostrils daily as needed for allergies or rhinitis.   HYDROcodone-acetaminophen 5-325 MG tablet Commonly known as: NORCO/VICODIN Take 1-2 tablets by mouth every 4  (four) hours as needed for moderate pain. What changed:  how much to take when to take this   Levothyroxine Sodium 100 MCG Caps Take 100 mcg by mouth daily.   methocarbamol 500 MG tablet Commonly known as: ROBAXIN Take 500 mg by mouth every 8 (eight) hours as needed for muscle spasms.   Mounjaro 12.5 MG/0.5ML Pen Generic drug: tirzepatide Inject 12.5 mg into the skin once a week. Friday   multivitamin with minerals Tabs tablet Take 1 tablet by mouth daily.   PARoxetine 20 MG tablet Commonly known as: PAXIL TAKE 1 TABLET BY MOUTH IN THE MORNING ATNOON AND AT BEDTIME What changed:  how much to take how to take this when to take this additional instructions   pramipexole 0.25 MG tablet Commonly known as: MIRAPEX TAKE 1 TABLET BY MOUTH EVERY MORNING AND1 AND 1/2 TABLETS AT NIGHT What changed:  how much to take how to take this when to take this additional instructions   propranolol ER 80 MG 24 hr capsule Commonly known as: INDERAL LA Take 80 mg by mouth daily.   rosuvastatin 5 MG tablet Commonly known as: CRESTOR TAKE 1 TABLET BY MOUTH ONCE A DAY   vitamin C 1000 MG tablet Take 1,000 mg by mouth daily.   Vitamin D-3 125 MCG (5000 UT) Tabs Take 5,000 Units by mouth daily.        Follow-up Information     Neilson Oehlert C, DO Follow up in 2 week(s).   Contact information: [address] [place] [address] [phone]                 Signed: Theodoro Doing Callaghan Laverdure 02/02/2022, 10:07 AM

## 2022-02-02 NOTE — Progress Notes (Signed)
PT Cancellation Note  Patient Details Name: [name] MRN: [id number] DOB: 1955-03-26   Cancelled Treatment:    Reason Eval/Treat Not Completed: PT screened, no needs identified, will sign off. Per discussion with OT, the pt does not require PT evaluation at this time, is mobilizing well and will have necessary assistance at the time of discharge. PT signing off.   Zenaida Niece 02/02/2022, 9:23 AM

## 2022-02-02 NOTE — Anesthesia Postprocedure Evaluation (Signed)
Anesthesia Post Note  Patient: [name]  Procedure(s) Performed: Minimally Invasive Surgery Sacroiliac Fusion ,Right (Right) Application of O-Arm (Right)     Patient location during evaluation: PACU Anesthesia Type: General Level of consciousness: awake and alert Pain management: pain level controlled Vital Signs Assessment: post-procedure vital signs reviewed and stable Respiratory status: spontaneous breathing, nonlabored ventilation, respiratory function stable and patient connected to nasal cannula oxygen Cardiovascular status: blood pressure returned to baseline and stable Postop Assessment: no apparent nausea or vomiting Anesthetic complications: no   There were no known notable events for this encounter.  Last Vitals:  Vitals:   02/01/22 2348 02/02/22 0610  BP: (!) 108/54 126/71  Pulse: (!) 59 69  Resp: 20 20  Temp: 36.7 C 36.7 C  SpO2: 95% 100%    Last Pain:  Vitals:   02/02/22 0610  TempSrc: Oral  PainSc:                  [place]

## 2022-02-02 NOTE — Evaluation (Signed)
Occupational Therapy Evaluation and Discharge Patient Details Name: [name] MRN: [id number] DOB: October 30, 1955 Today's Date: 02/02/2022   History of Present Illness This 67 yo female is s/p right minimally invasive sacroiliac arthrodesis due to right sacroiliitis causing significant pain and altering her lifestyle.   Clinical Impression   This 67 yo female admitted and underwent above presents to acute OT with PLOF of being independent with basic ADLs but painful. Currently she is at an independent to Mod I level from a DME/AE standpoint for ADLs and mobility. No further OT needs, and no PT needs identified. Acute OT will sign off.      Recommendations for follow up therapy are one component of a multi-disciplinary discharge planning process, led by the attending physician.  Recommendations may be updated based on patient status, additional functional criteria and insurance authorization.   Follow Up Recommendations  No OT follow up     Assistance Recommended at Discharge PRN  Patient can return home with the following Assistance with cooking/housework;Assist for transportation    Functional Status Assessment  Patient has had a recent decline in their functional status and demonstrates the ability to make significant improvements in function in a reasonable and predictable amount of time. (without further OT needs--education completed)  Equipment Recommendations  None recommended by OT       Precautions / Restrictions Precautions Precautions: Back Precaution Booklet Issued: Yes (comment) Restrictions Weight Bearing Restrictions: No      Mobility Bed Mobility Overal bed mobility: Modified Independent                  Transfers Overall transfer level: Independent Equipment used: Rolling walker (2 wheels)                      Balance Overall balance assessment: Mild deficits observed, not formally tested                                          ADL either performed or assessed with clinical judgement   ADL Overall ADL's : Needs assistance/impaired Eating/Feeding: Independent;Sitting   Grooming: Modified independent;Standing Grooming Details (indicate cue type and reason): educated on 2 cups to brush teeth to avoid bending over sink Upper Body Bathing: Sitting;Independent   Lower Body Bathing: Modified independent;Sit to/from stand;With adaptive equipment;Adhering to back precautions   Upper Body Dressing : Independent;Sitting   Lower Body Dressing: Modified independent;With adaptive equipment;Sit to/from stand Lower Body Dressing Details (indicate cue type and reason): educated on use of reacher and sock aid for LBD--pt's husband ordered her a sock aid, she already has a Secondary school teacher at home Toilet Transfer: Modified Independent;Rolling walker (2 wheels)   Toileting- Clothing Manipulation and Hygiene: Modified independent;Sit to/from Nurse, children's Details (indicate cue type and reason): Educated on side step into tub, pt's husband ordered her a tub seat         Vision Baseline Vision/History: 1 Wears glasses Ability to See in Adequate Light: 0 Adequate Patient Visual Report: No change from baseline              Pertinent Vitals/Pain Pain Assessment Pain Assessment: Faces Faces Pain Scale: Hurts a little bit Pain Location: incisional twinges at times Pain Intervention(s): Limited activity within patient's tolerance, Monitored during session     Hand Dominance Right   Extremity/Trunk Assessment Upper Extremity Assessment Upper  Extremity Assessment: Overall WFL for tasks assessed           Communication Communication Communication: No difficulties   Cognition Arousal/Alertness: Awake/alert Behavior During Therapy: WFL for tasks assessed/performed Overall Cognitive Status: Within Functional Limits for tasks assessed                                                   Home Living Family/patient expects to be discharged to:: Private residence Living Arrangements: Spouse/significant other Available Help at Discharge: Family;Available 24 hours/day Type of Home: House Home Access: Level entry     Home Layout: One level     Bathroom Shower/Tub: Tub/shower unit;Curtain   Bathroom Toilet: Handicapped height     Home Equipment: Conservation officer, nature (2 wheels);BSC/3in1          Prior Functioning/Environment Prior Level of Function : Independent/Modified Independent;Driving                        OT Problem List: Decreased range of motion;Impaired balance (sitting and/or standing);Pain         OT Goals(Current goals can be found in the care plan section) Acute Rehab OT Goals Patient Stated Goal: to go home today         AM-PAC OT "6 Clicks" Daily Activity     Outcome Measure Help from another person eating meals?: None Help from another person taking care of personal grooming?: None Help from another person toileting, which includes using toliet, bedpan, or urinal?: None Help from another person bathing (including washing, rinsing, drying)?: None Help from another person to put on and taking off regular upper body clothing?: None Help from another person to put on and taking off regular lower body clothing?: None 6 Click Score: 24   End of Session Equipment Utilized During Treatment: Rolling walker (2 wheels) Nurse Communication:  (no further OT needs and no PT needs written on paper at RN station)  Activity Tolerance: Patient tolerated treatment well Patient left:  (sitting on EOB)  OT Visit Diagnosis: Other abnormalities of gait and mobility (R26.89);Muscle weakness (generalized) (M62.81);Pain Pain - Right/Left: Right Pain - part of body:  (incisional)                Time: 8938-1017 OT Time Calculation (min): 37 min Charges:  OT General Charges $OT Visit: 1 Visit OT Evaluation $OT Eval Moderate Complexity: 1 Mod OT  Treatments $Self Care/Home Management : 8-22 mins  Golden Circle, OTR/L [facility] [phone] Office [phone]    Almon Register 02/02/2022, 11:00 AM

## 2022-03-01 ENCOUNTER — Other Ambulatory Visit: Payer: Self-pay

## 2022-03-01 DIAGNOSIS — F429 Obsessive-compulsive disorder, unspecified: Secondary | ICD-10-CM

## 2022-03-01 DIAGNOSIS — F3340 Major depressive disorder, recurrent, in remission, unspecified: Secondary | ICD-10-CM

## 2022-03-01 DIAGNOSIS — G2581 Restless legs syndrome: Secondary | ICD-10-CM

## 2022-03-01 DIAGNOSIS — F341 Dysthymic disorder: Secondary | ICD-10-CM

## 2022-03-01 DIAGNOSIS — F411 Generalized anxiety disorder: Secondary | ICD-10-CM

## 2022-03-01 DIAGNOSIS — F4010 Social phobia, unspecified: Secondary | ICD-10-CM

## 2022-03-01 MED ORDER — PRAMIPEXOLE DIHYDROCHLORIDE 0.25 MG PO TABS: ORAL_TABLET | ORAL | 0 refills | Status: DC

## 2022-03-01 MED ORDER — PAROXETINE HCL 20 MG PO TABS: ORAL_TABLET | ORAL | 0 refills | Status: DC

## 2022-03-09 DIAGNOSIS — R7303 Prediabetes: Secondary | ICD-10-CM | POA: Diagnosis not present

## 2022-03-09 DIAGNOSIS — E039 Hypothyroidism, unspecified: Secondary | ICD-10-CM | POA: Diagnosis not present

## 2022-03-22 DIAGNOSIS — M180 Bilateral primary osteoarthritis of first carpometacarpal joints: Secondary | ICD-10-CM | POA: Diagnosis not present

## 2022-03-22 DIAGNOSIS — M1812 Unilateral primary osteoarthritis of first carpometacarpal joint, left hand: Secondary | ICD-10-CM | POA: Diagnosis not present

## 2022-03-22 DIAGNOSIS — M65331 Trigger finger, right middle finger: Secondary | ICD-10-CM | POA: Diagnosis not present

## 2022-03-23 DIAGNOSIS — Z6841 Body Mass Index (BMI) 40.0 and over, adult: Secondary | ICD-10-CM | POA: Diagnosis not present

## 2022-03-23 DIAGNOSIS — M461 Sacroiliitis, not elsewhere classified: Secondary | ICD-10-CM | POA: Diagnosis not present

## 2022-04-11 ENCOUNTER — Ambulatory Visit: Payer: Medicare PPO | Admitting: Psychiatry

## 2022-04-11 DIAGNOSIS — R7303 Prediabetes: Secondary | ICD-10-CM | POA: Diagnosis not present

## 2022-04-11 DIAGNOSIS — M797 Fibromyalgia: Secondary | ICD-10-CM | POA: Diagnosis not present

## 2022-04-11 DIAGNOSIS — J301 Allergic rhinitis due to pollen: Secondary | ICD-10-CM | POA: Diagnosis not present

## 2022-04-11 DIAGNOSIS — E039 Hypothyroidism, unspecified: Secondary | ICD-10-CM | POA: Diagnosis not present

## 2022-04-11 DIAGNOSIS — Z6841 Body Mass Index (BMI) 40.0 and over, adult: Secondary | ICD-10-CM | POA: Diagnosis not present

## 2022-04-11 DIAGNOSIS — R002 Palpitations: Secondary | ICD-10-CM | POA: Diagnosis not present

## 2022-04-11 DIAGNOSIS — G8929 Other chronic pain: Secondary | ICD-10-CM | POA: Diagnosis not present

## 2022-04-11 DIAGNOSIS — E7800 Pure hypercholesterolemia, unspecified: Secondary | ICD-10-CM | POA: Diagnosis not present

## 2022-04-12 DIAGNOSIS — Z79899 Other long term (current) drug therapy: Secondary | ICD-10-CM | POA: Diagnosis not present

## 2022-04-12 DIAGNOSIS — Z6841 Body Mass Index (BMI) 40.0 and over, adult: Secondary | ICD-10-CM | POA: Diagnosis not present

## 2022-05-04 ENCOUNTER — Other Ambulatory Visit: Payer: Self-pay | Admitting: Psychiatry

## 2022-05-04 DIAGNOSIS — G2581 Restless legs syndrome: Secondary | ICD-10-CM

## 2022-05-10 DIAGNOSIS — R7303 Prediabetes: Secondary | ICD-10-CM | POA: Diagnosis not present

## 2022-05-10 DIAGNOSIS — Z7689 Persons encountering health services in other specified circumstances: Secondary | ICD-10-CM | POA: Diagnosis not present

## 2022-05-10 DIAGNOSIS — Z79899 Other long term (current) drug therapy: Secondary | ICD-10-CM | POA: Diagnosis not present

## 2022-05-10 DIAGNOSIS — Z6841 Body Mass Index (BMI) 40.0 and over, adult: Secondary | ICD-10-CM | POA: Diagnosis not present

## 2022-05-10 DIAGNOSIS — R109 Unspecified abdominal pain: Secondary | ICD-10-CM | POA: Diagnosis not present

## 2022-05-11 DIAGNOSIS — Z79899 Other long term (current) drug therapy: Secondary | ICD-10-CM | POA: Diagnosis not present

## 2022-05-12 ENCOUNTER — Telehealth: Payer: Medicare PPO | Admitting: Psychiatry

## 2022-05-16 DIAGNOSIS — M461 Sacroiliitis, not elsewhere classified: Secondary | ICD-10-CM | POA: Diagnosis not present

## 2022-06-02 DIAGNOSIS — J069 Acute upper respiratory infection, unspecified: Secondary | ICD-10-CM | POA: Diagnosis not present

## 2022-06-02 DIAGNOSIS — E039 Hypothyroidism, unspecified: Secondary | ICD-10-CM | POA: Diagnosis not present

## 2022-06-02 DIAGNOSIS — R0981 Nasal congestion: Secondary | ICD-10-CM | POA: Diagnosis not present

## 2022-06-02 DIAGNOSIS — M7918 Myalgia, other site: Secondary | ICD-10-CM | POA: Diagnosis not present

## 2022-06-02 DIAGNOSIS — R7301 Impaired fasting glucose: Secondary | ICD-10-CM | POA: Diagnosis not present

## 2022-06-13 DIAGNOSIS — Z6841 Body Mass Index (BMI) 40.0 and over, adult: Secondary | ICD-10-CM | POA: Diagnosis not present

## 2022-06-13 DIAGNOSIS — M461 Sacroiliitis, not elsewhere classified: Secondary | ICD-10-CM | POA: Diagnosis not present

## 2022-06-15 ENCOUNTER — Other Ambulatory Visit: Payer: Self-pay | Admitting: Neurological Surgery

## 2022-06-15 DIAGNOSIS — M461 Sacroiliitis, not elsewhere classified: Secondary | ICD-10-CM

## 2022-06-16 ENCOUNTER — Telehealth: Payer: Medicare PPO | Admitting: Psychiatry

## 2022-06-20 DIAGNOSIS — L718 Other rosacea: Secondary | ICD-10-CM | POA: Diagnosis not present

## 2022-06-20 DIAGNOSIS — L821 Other seborrheic keratosis: Secondary | ICD-10-CM | POA: Diagnosis not present

## 2022-07-04 ENCOUNTER — Ambulatory Visit
Admission: RE | Admit: 2022-07-04 | Discharge: 2022-07-04 | Disposition: A | Discharge status: Home / Self Care | Payer: Medicare PPO | Source: Ambulatory Visit | Attending: Neurological Surgery | Admitting: Neurological Surgery

## 2022-07-04 DIAGNOSIS — M533 Sacrococcygeal disorders, not elsewhere classified: Secondary | ICD-10-CM | POA: Diagnosis not present

## 2022-07-04 DIAGNOSIS — M461 Sacroiliitis, not elsewhere classified: Secondary | ICD-10-CM

## 2022-07-04 MED ORDER — METHYLPREDNISOLONE ACETATE 40 MG/ML INJ SUSP (RADIOLOG
120.0000 mg | Freq: Once | INTRAMUSCULAR | Status: AC
  Administered 2022-07-04: 120 mg via INTRA_ARTICULAR

## 2022-07-19 ENCOUNTER — Other Ambulatory Visit: Payer: Self-pay | Admitting: Psychiatry

## 2022-07-19 DIAGNOSIS — G2581 Restless legs syndrome: Secondary | ICD-10-CM

## 2022-07-26 ENCOUNTER — Other Ambulatory Visit: Payer: Self-pay | Admitting: Family Medicine

## 2022-07-26 DIAGNOSIS — Z1231 Encounter for screening mammogram for malignant neoplasm of breast: Secondary | ICD-10-CM

## 2022-08-01 DIAGNOSIS — M461 Sacroiliitis, not elsewhere classified: Secondary | ICD-10-CM | POA: Diagnosis not present

## 2022-08-01 DIAGNOSIS — E039 Hypothyroidism, unspecified: Secondary | ICD-10-CM | POA: Diagnosis not present

## 2022-08-08 ENCOUNTER — Encounter: Payer: Self-pay | Admitting: Psychiatry

## 2022-08-08 ENCOUNTER — Telehealth (INDEPENDENT_AMBULATORY_CARE_PROVIDER_SITE_OTHER): Payer: Medicare PPO | Admitting: Psychiatry

## 2022-08-08 DIAGNOSIS — F4010 Social phobia, unspecified: Secondary | ICD-10-CM

## 2022-08-08 DIAGNOSIS — F341 Dysthymic disorder: Secondary | ICD-10-CM

## 2022-08-08 DIAGNOSIS — G2581 Restless legs syndrome: Secondary | ICD-10-CM

## 2022-08-08 DIAGNOSIS — F3340 Major depressive disorder, recurrent, in remission, unspecified: Secondary | ICD-10-CM

## 2022-08-08 DIAGNOSIS — F429 Obsessive-compulsive disorder, unspecified: Secondary | ICD-10-CM | POA: Diagnosis not present

## 2022-08-08 DIAGNOSIS — F4001 Agoraphobia with panic disorder: Secondary | ICD-10-CM | POA: Diagnosis not present

## 2022-08-08 DIAGNOSIS — F3342 Major depressive disorder, recurrent, in full remission: Secondary | ICD-10-CM | POA: Diagnosis not present

## 2022-08-08 DIAGNOSIS — F411 Generalized anxiety disorder: Secondary | ICD-10-CM | POA: Diagnosis not present

## 2022-08-08 MED ORDER — PAROXETINE HCL 20 MG PO TABS: ORAL_TABLET | ORAL | 3 refills | Status: DC

## 2022-08-08 MED ORDER — ALPRAZOLAM 0.25 MG PO TABS: 0.2500 mg | ORAL_TABLET | Freq: Four times a day (QID) | ORAL | 2 refills | Status: DC | PRN

## 2022-08-08 MED ORDER — PRAMIPEXOLE DIHYDROCHLORIDE 0.25 MG PO TABS: ORAL_TABLET | ORAL | 3 refills | Status: DC

## 2022-08-08 NOTE — Progress Notes (Signed)
[name] [id number] 1955/12/04 67 y.o.  Video Visit via My Chart  I connected with pt by video using My Chart and verified that I am speaking with the correct person using two identifiers.   I discussed the limitations, risks, security and privacy concerns of performing an evaluation and management service by My Chart  and the availability of in person appointments. I also discussed with the patient that there may be a patient responsible charge related to this service. The patient expressed understanding and agreed to proceed.  I discussed the assessment and treatment plan with the patient. The patient was provided an opportunity to ask questions and all were answered. The patient agreed with the plan and demonstrated an understanding of the instructions.   The patient was advised to call back or seek an in-person evaluation if the symptoms worsen or if the condition fails to improve as anticipated.  I provided 30 minutes of video time during this encounter.  The patient was located at home and the provider was located office.   Session 500-530   Subjective:   Patient ID:  [name] is a 67 y.o. (DOB 21-Jan-1955) female.  Chief Complaint:  Chief Complaint  Patient presents with   Follow-up   Depression   Anxiety    HPI [name] presents for follow-up of treatment resistant OCD, major depression, and restless leg syndrome.  seen May 2019.  She had accidentally reduced her paroxetine from 60 mg to 40 mg and it was increased back to the appropriate 60 mg dose.  She never increased the dosage.  seen May 2020.  She continued on paroxetine 40 mg a day.  Her anxiety level was high.  We discussed increasing it back to 60 mg a day which she had taken in the past.  She indicated a willingness to do so.  This had been discussed at the previous appointment as well but she never made the change.   As of Feb 2021, more anxiety.  September fusions back  surgery.  Created lots of anxiety.   More scary thoughts and more OCD.  Covid triggers obsessive fears about the end of times biblically.   Didn't increase paroxetine bc afraid she will lose options for future fear of worsening options.  "I'm saving it."   More down over the back surgery and immobility.  Depression was OK otherwise.  Been doing pretty good for the most part, but more anxious lately.  Nervous facing knee and possible back surgery.  Also working on obsessions with Dr. Farrel Demark.  She wonders about increasing the Paxil.  She never increased the Paxil to 60 mg daily.  Disc pros and cons of this.  Needing more Xanax but it's not a high dosage.. Plan: increase in paroxetine to 60 mg daily.  03/26/2020 appointment with following noted: Unusually patient has not been seen in over a year. Pretty good for the most part.  Still has triggers for OCD like yesterday memorial service but can pull herself out of it.Still has the OCD.  Easier to dismiss the obsessions and anxiety is better. Increase paroxetine helped her.  Recognizes benefit from the meds.  Stopped counseling and feels OK about it.  Much better than a year ago. Takes Xanax but not lately.  At least 2 weeks. Has tremor off and on and can affect handwriting.  Sisters & mother have tremor also. Chronic back pain and accepted it.  Taking propranolol ER 60 for anxiety.  10/05/2021 appt  noted: Reduced paroxetine to 20 BID for 6 mos or so.  Just trying to do with less.  No other reason. Anxiety is an issue but she felt it was not related to paroxeitine reduction. Shaking triggered by hyperthyroidism has made her a nervous wreck and this triggers fear of PD.  Reduced thyroid med 4 weeks ago without resolution. Taking increased Xanax 0.25 mg BID and increased propranolol to 80 mg daily.  08/08/22 appt noted: Doing pretty well.   Meds: Xanax 0.25 mg typically just prn, paroxetine 20 BID, pramipexole 0.25 mg in amd and 0.375 mg PM, propranolol  LA 80mg  daily. No problems with meds. Still has triggers but anxiety otherwise OCD is pretty stable.  Death can trigger salvation obsessions. Not dep at all.   Some back trouble again and fusion SI joint done Jan and probably needs other side. Back pain can interfere with sleep. DX FM and complaining of fibro fog.  Patient reports stable mood and denies depressed or irritable moods.  She has a history of severe depression but that is under control.  Patient denies difficulty with sleep initiation or maintenance. Denies appetite disturbance.  Patient reports that energy and motivation have been good.  Patient denies any difficulty with concentration.  Patient denies any suicidal ideation.  Past Psychiatric Medication Trials: Paroxetine, venlafaxine, clomipramine, Lexapro 40 no response, sertraline, duloxetine, fluvoxamine,   Wellbutrin 300 panic, Viibryd, Fetzima,  Abilify, Risperdal, Geodon 40 twice daily side effects, Seroquel, Latuda, Rexulti, Saphris,  N-acetylcysteine, Deplin, clonidine, pindolol, gabapentin SI, lamotrigine, buspirone, pramipexole, Xanax  Review of Systems:  Review of Systems  Cardiovascular:  Negative for palpitations.  Musculoskeletal:  Positive for back pain, gait problem and joint swelling.  Neurological:  Positive for tremors. Negative for weakness.  Psychiatric/Behavioral:  The patient is nervous/anxious.     Medications: I have reviewed the patient's current medications.  Current Outpatient Medications  Medication Sig Dispense Refill   acetaminophen (TYLENOL) 500 MG tablet Take 500 mg by mouth every 6 (six) hours as needed for moderate pain.     amoxicillin (AMOXIL) 500 MG capsule Take 2,000 mg by mouth See admin instructions. Take 2000 mg by mouth 1 hour prior to dental procedure.     Ascorbic Acid (VITAMIN C) 1000 MG tablet Take 1,000 mg by mouth daily.     aspirin EC 81 MG tablet Take 1 tablet (81 mg total) by mouth daily. Swallow whole. 90 tablet 3   B  Complex Vitamins (B COMPLEX PO) Place 1 tablet under the tongue daily.     betamethasone dipropionate 0.05 % cream Apply 1 application topically 2 (two) times daily as needed (irritation).     Cholecalciferol (VITAMIN D-3) 5000 UNITS TABS Take 5,000 Units by mouth daily.     docusate sodium (COLACE) 100 MG capsule Take 1 capsule (100 mg total) by mouth 2 (two) times daily. 10 capsule 0   ezetimibe (ZETIA) 10 MG tablet Take 10 mg by mouth daily.      fexofenadine (ALLEGRA) 180 MG tablet Take 180 mg by mouth daily as needed for allergies.      fluticasone (FLONASE) 50 MCG/ACT nasal spray Place 1-2 sprays into both nostrils daily as needed for allergies or rhinitis.     HYDROcodone-acetaminophen (NORCO/VICODIN) 5-325 MG tablet Take 1-2 tablets by mouth every 4 (four) hours as needed for moderate pain. 30 tablet 0   Levothyroxine Sodium 100 MCG CAPS Take 100 mcg by mouth daily.     methocarbamol (ROBAXIN) 500 MG tablet  Take 500 mg by mouth every 8 (eight) hours as needed for muscle spasms.     MOUNJARO 12.5 MG/0.5ML Pen Inject 12.5 mg into the skin once a week. Friday     Multiple Vitamin (MULTIVITAMIN WITH MINERALS) TABS tablet Take 1 tablet by mouth daily.     propranolol ER (INDERAL LA) 80 MG 24 hr capsule Take 80 mg by mouth daily.     rosuvastatin (CRESTOR) 5 MG tablet TAKE 1 TABLET BY MOUTH ONCE A DAY 90 tablet 2   ALPRAZolam (XANAX) 0.25 MG tablet Take 1 tablet (0.25 mg total) by mouth 4 (four) times daily as needed for anxiety. 120 tablet 2   PARoxetine (PAXIL) 20 MG tablet TAKE 1 TABLET BY MOUTH IN THE MORNING ATNOON AND AT BEDTIME 270 tablet 3   pramipexole (MIRAPEX) 0.25 MG tablet TAKE ONE TABLET BY MOUTH IN THE MORNING AND ONE AND ONE HALF (1/2) TABLET BY MOUTH AT NIGHT. 225 tablet 3   No current facility-administered medications for this visit.    Medication Side Effects: None  Allergies:  Allergies  Allergen Reactions   Morphine And Codeine Other (See Comments)    Feels like bugs  crawling over her   Nsaids Hives   Tape Other (See Comments)    Blisters with tape after surgical procedures   Tapentadol Other (See Comments)    Blisters with tape after surgical procedures   Levothyroxine     Other reaction(s): hot flashes,    Morphine Sulfate Hives   Naproxen Sodium    Nitrofurantoin Hives   Tramadol     Other reaction(s): Pt reports drug interaction with Paxil   Tyloxapol     Other reaction(s): hyper   Buspar [Buspirone] Palpitations   Ciprofloxacin Nausea Only    Upset stomach    Codeine Other (See Comments)    Hyper   Gabapentin Other (See Comments)    Suicidal ideation.   Lodine [Etodolac] Nausea Only    Upset Stomach    Lyrica [Pregabalin] Other (See Comments)    suicidal ideation   Niacin And Related Other (See Comments)    flushing   Pravastatin Other (See Comments)    achiness    Septra [Sulfamethoxazole-Trimethoprim] Other (See Comments)    Stomach pains   Tylox [Oxycodone-Acetaminophen] Other (See Comments)    Hyper   Wellbutrin [Bupropion] Other (See Comments)    dizzy    Past Medical History:  Diagnosis Date   Anginal pain (HCC)    "on and off" (11/01/2011) - anxiety related   Anxiety    takes Paxil daily and Xanax prn   Arthritis    "knees" (10/16/2013)   Chronic back pain    facet disease and bulging disc; "thoracic and lower back" (10/16/2013)   Constipation    r/t pain meds;takes stool softener daily   COVID 01/2022   Dysrhythmia    rapid HR on occasion-takes Propranolol daily   Family history of anesthesia complication    ":PONV; mom and sisters"   History of bladder infections    > 95yr ago   History of kidney stones    Hyperlipidemia    takes LIpitor daily   Hypothyroidism    takes Synthroid daily   Insomnia    Joint pain    Joint swelling    Muscle spasms of head or neck    lumbar and thoracic;takes Robaxin prn   Peripheral neuropathy    PONV (postoperative nausea and vomiting)    Rosacea    Seasonal  allergies    takes allegra prn   Sleep apnea     Family History  Problem Relation Age of Onset   Heart disease Mother 21   Heart failure Mother    Heart attack Father    Heart disease Father 88   Breast cancer Sister        has had it 2 times    Social History   Socioeconomic History   Marital status: Married    Spouse name: Not on file   Number of children: Not on file   Years of education: Not on file   Highest education level: Not on file  Occupational History   Not on file  Tobacco Use   Smoking status: Never   Smokeless tobacco: Never  Vaping Use   Vaping status: Never Used  Substance and Sexual Activity   Alcohol use: No   Drug use: No   Sexual activity: Yes    Birth control/protection: Surgical  Other Topics Concern   Not on file  Social History Narrative   Not on file   Social Determinants of Health   Financial Resource Strain: Not on file  Food Insecurity: Not on file  Transportation Needs: Not on file  Physical Activity: Not on file  Stress: Not on file  Social Connections: Not on file  Intimate Partner Violence: Not on file    Past Medical History, Surgical history, Social history, and Family history were reviewed and updated as appropriate.   Please see review of systems for further details on the patient's review from today.   Objective:   Physical Exam:  There were no vitals taken for this visit.  Physical Exam Neurological:     Mental Status: She is alert and oriented to person, place, and time.     Cranial Nerves: No dysarthria.  Psychiatric:        Attention and Perception: Attention and perception normal.        Mood and Affect: Mood is anxious. Mood is not depressed.        Speech: Speech normal.        Behavior: Behavior is cooperative.        Thought Content: Thought content normal. Thought content is not paranoid or delusional. Thought content does not include homicidal or suicidal ideation. Thought content does not include  suicidal plan.        Cognition and Memory: Cognition and memory normal.        Judgment: Judgment normal.     Comments: Insight intact     Lab Review:     Component Value Date/Time   NA 140 02/01/2022 0555   NA 141 06/15/2018 1411   K 3.7 02/01/2022 0555   CL 105 02/01/2022 0555   CO2 23 02/01/2022 0555   GLUCOSE 109 (H) 02/01/2022 0555   BUN 16 02/01/2022 0555   BUN 13 06/15/2018 1411   CREATININE 0.89 02/01/2022 0555   CALCIUM 9.1 02/01/2022 0555   PROT 6.5 10/06/2021 0852   ALBUMIN 3.9 10/06/2021 0852   AST 20 10/06/2021 0852   ALT 21 10/06/2021 0852   ALKPHOS 61 10/06/2021 0852   BILITOT 0.4 10/06/2021 0852   GFRNONAA >60 02/01/2022 0555   GFRAA >60 10/16/2018 0558       Component Value Date/Time   WBC 7.0 02/01/2022 0555   RBC 3.92 02/01/2022 0555   HGB 12.4 02/01/2022 0555   HCT 39.1 02/01/2022 0555   PLT 338 02/01/2022 0555   MCV 99.7 02/01/2022  0555   MCH 31.6 02/01/2022 0555   MCHC 31.7 02/01/2022 0555   RDW 13.5 02/01/2022 0555   LYMPHSABS 2,217 04/21/2020 1643   MONOABS 0.5 10/08/2013 1050   EOSABS 129 04/21/2020 1643   BASOSABS 48 04/21/2020 1643    No results found for: "POCLITH", "LITHIUM"   No results found for: "PHENYTOIN", "PHENOBARB", "VALPROATE", "CBMZ"   .res Assessment: Plan:    Obsessive-compulsive disorder - scrupulosity type - Plan: PARoxetine (PAXIL) 20 MG tablet  Generalized anxiety disorder - Plan: ALPRAZolam (XANAX) 0.25 MG tablet, PARoxetine (PAXIL) 20 MG tablet  Panic disorder with agoraphobia  Social anxiety disorder - Plan: PARoxetine (PAXIL) 20 MG tablet  Recurrent major depression in complete remission (HCC)  Restless legs syndrome - Plan: pramipexole (MIRAPEX) 0.25 MG tablet  Early onset dysthymia - Plan: PARoxetine (PAXIL) 20 MG tablet  Recurrent major depressive disorder, in remission (HCC) - Plan: PARoxetine (PAXIL) 20 MG tablet   30 min of non face to face time with patient was spent on counseling and  coordination of care. We discussed She overall has been satisfied with the meds in spite of being on a relatively low dose of paroxetine 40 mg given her history of treatment resistant OCD.  She  made progress in therapy that is helping.    Overall OCD is manageable but not gone.  Disc CBT and she's aware.     Can temporarily increase Xanax 0.25 mg TID - QID until hyperthyroidism resolved.  Propranolol is also helping her anxiety and she's satisfied but wants to increase for her tremor.  Her restless legs is managed as long as she takes the pramipexole.   continue paroxetine 40 mg daily. Option increase if needed. Pt historically  needs high dosage bc of TR OCD.  But done ok with 40 mg daily.  Don't go lower.  Rec trial (NA[facility]) N-Acetylcysteine 2 of the  600 mg capsules daily to help with mild cognitive problems.  It can be combined with a B-complex vitamin as the B-12 and folate which can sometimes enhance the effect.  Follow-up 9 mos bc stable.  Meredith Staggers, MD, DFAPA.  Future Appointments  Date Time Provider Department Center  09/09/2022 11:30 AM [facility]  10/14/2022  3:35 PM Louanne Skye, Devoria Albe., NP [id number]    No orders of the defined types were placed in this encounter.     -------------------------------

## 2022-08-11 DIAGNOSIS — Z9884 Bariatric surgery status: Secondary | ICD-10-CM | POA: Diagnosis not present

## 2022-08-11 DIAGNOSIS — Z79899 Other long term (current) drug therapy: Secondary | ICD-10-CM | POA: Diagnosis not present

## 2022-08-11 DIAGNOSIS — R7303 Prediabetes: Secondary | ICD-10-CM | POA: Diagnosis not present

## 2022-08-11 DIAGNOSIS — Z6841 Body Mass Index (BMI) 40.0 and over, adult: Secondary | ICD-10-CM | POA: Diagnosis not present

## 2022-08-22 DIAGNOSIS — Z6841 Body Mass Index (BMI) 40.0 and over, adult: Secondary | ICD-10-CM | POA: Diagnosis not present

## 2022-08-22 DIAGNOSIS — M5412 Radiculopathy, cervical region: Secondary | ICD-10-CM | POA: Diagnosis not present

## 2022-08-30 ENCOUNTER — Other Ambulatory Visit: Payer: Self-pay | Admitting: Surgery

## 2022-08-30 DIAGNOSIS — M5412 Radiculopathy, cervical region: Secondary | ICD-10-CM

## 2022-08-31 ENCOUNTER — Other Ambulatory Visit: Payer: Self-pay

## 2022-08-31 ENCOUNTER — Ambulatory Visit (HOSPITAL_BASED_OUTPATIENT_CLINIC_OR_DEPARTMENT_OTHER): Payer: Medicare PPO | Attending: Neurological Surgery | Admitting: Physical Therapy

## 2022-08-31 ENCOUNTER — Encounter (HOSPITAL_BASED_OUTPATIENT_CLINIC_OR_DEPARTMENT_OTHER): Payer: Self-pay | Admitting: Physical Therapy

## 2022-08-31 DIAGNOSIS — M6281 Muscle weakness (generalized): Secondary | ICD-10-CM | POA: Diagnosis not present

## 2022-08-31 DIAGNOSIS — M461 Sacroiliitis, not elsewhere classified: Secondary | ICD-10-CM | POA: Insufficient documentation

## 2022-08-31 DIAGNOSIS — R2689 Other abnormalities of gait and mobility: Secondary | ICD-10-CM | POA: Insufficient documentation

## 2022-08-31 NOTE — Therapy (Signed)
OUTPATIENT PHYSICAL THERAPY THORACOLUMBAR EVALUATION   Patient Name: [name] MRN: [id number] DOB:1955/02/25, 67 y.o., female Today's Date: 08/31/2022  END OF SESSION:  PT End of Session - 08/31/22 0938     Visit Number 1    Number of Visits 16    Date for PT Re-Evaluation 10/28/22    Authorization Type humana mcr    PT Start Time 0818    PT Stop Time 0900    PT Time Calculation (min) 42 min    Activity Tolerance Patient tolerated treatment well    Behavior During Therapy WFL for tasks assessed/performed             Past Medical History:  Diagnosis Date   Anginal pain (HCC)    "on and off" (11/01/2011) - anxiety related   Anxiety    takes Paxil daily and Xanax prn   Arthritis    "knees" (10/16/2013)   Chronic back pain    facet disease and bulging disc; "thoracic and lower back" (10/16/2013)   Constipation    r/t pain meds;takes stool softener daily   COVID 01/2022   Dysrhythmia    rapid HR on occasion-takes Propranolol daily   Family history of anesthesia complication    ":PONV; mom and sisters"   History of bladder infections    > 52yr ago   History of kidney stones    Hyperlipidemia    takes LIpitor daily   Hypothyroidism    takes Synthroid daily   Insomnia    Joint pain    Joint swelling    Muscle spasms of head or neck    lumbar and thoracic;takes Robaxin prn   Peripheral neuropathy    PONV (postoperative nausea and vomiting)    Rosacea    Seasonal allergies    takes allegra prn   Sleep apnea    Past Surgical History:  Procedure Laterality Date   ABDOMINAL HYSTERECTOMY  1980's   ANAL FISSURE REPAIR  ~ 2011   "banded hemorrhoids at this time too"   BACK SURGERY  10/20/2018   lumbar fusion L4-5, L5-S1   BARIATRIC SURGERY     CARDIAC CATHETERIZATION  ~ 1999   CARPAL TUNNEL RELEASE Left 09/13/2017   & ulnar nerve release   CERVICAL DISC SURGERY  03/2017   C5, C6    COLONOSCOPY     ESOPHAGOGASTRODUODENOSCOPY     KNEE  ARTHROPLASTY Left 2007   KNEE ARTHROSCOPY Right    KNEE ARTHROSCOPY Right    "torn meniscus"   KNEE SURGERY Left 1982-2013   "17 before 10/31/2013"   LEFT OOPHORECTOMY  1980's   PATELLECTOMY Right    PLANTAR FASCIA SURGERY Bilateral 1990's   REVISION TOTAL KNEE ARTHROPLASTY Left 11/01/2011   SACROILIAC JOINT FUSION Right 02/01/2022   Procedure: Minimally Invasive Surgery Sacroiliac Fusion ,Right;  Surgeon: Dawley, Alan Mulder, DO;  Location: [facility] OR;  Service: Neurosurgery;  Laterality: Right;   TOTAL KNEE ARTHROPLASTY Right 10/15/2013   Procedure: RIGHT TOTAL KNEE ARTHROPLASTY;  Surgeon: Valeria Batman, MD;  Location: [facility] OR;  Service: Orthopedics;  Laterality: Right;   TOTAL KNEE REVISION  11/01/2011   Procedure: TOTAL KNEE REVISION;  Surgeon: Valeria Batman, MD;  Location: [facility] OR;  Service: Orthopedics;  Laterality: Left;  Revision Left Total Knee Replacement    TOTAL KNEE REVISION Right 09/25/2020   Procedure: RIGHT POLY LINER EXCHANGE;  Surgeon: Kathryne Hitch, MD;  Location: [facility] ORS;  Service: Orthopedics;  Laterality: Right;   TUBAL LIGATION  1981   Patient Active Problem List   Diagnosis Date Noted   Sacroiliitis (HCC) 02/01/2022   Status post revision of total knee, right 11/05/2020   Status post revision of total replacement of right knee 09/25/2020   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 (HCC) 09/24/2020   Pain in right hip 07/08/2020   Painful total knee replacement, right (HCC) 06/18/2020   Pain in right knee 05/07/2020   Knee pain, bilateral 04/21/2020   Spondylolisthesis of lumbar region 10/15/2018   Early onset dysthymia 02/12/2018   Obsessive-compulsive disorder 10/10/2017   Generalized anxiety disorder 10/10/2017   Major depressive disorder, recurrent episode, moderate (HCC) 10/10/2017   Hiatal hernia 03/03/2015   Steatosis of liver 09/16/2014   Obstructive sleep apnea syndrome 08/27/2014   Pre-op evaluation 05/14/2014   Osteoarthritis of left  knee 10/17/2013   History of left knee replacement 10/15/2013   Hypercholesterolemia 08/05/2013   Palpitations 08/05/2013   HTN (hypertension) 08/05/2013   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 08/05/2013   Preoperative cardiovascular examination 08/05/2013   Degenerative arthritis of knee, bilateral 08/05/2013   Chronic pain of left knee 11/03/2011   Morbid obesity (HCC) 11/03/2011   Hypothyroid 11/03/2011    PCP: Noberto Retort, MD   REFERRING PROVIDER:   Dawley, Alan Mulder, DO    REFERRING DIAG: Sacroiliitis, not elsewhere classified   Rationale for Evaluation and Treatment: Rehabilitation  THERAPY DIAG:  Sacroiliitis, not elsewhere classified (HCC)  Muscle weakness (generalized)  Other abnormalities of gait and mobility  ONSET DATE: 1 year  SUBJECTIVE:                                                                                                                                                                                           SUBJECTIVE STATEMENT: R sacroiliac area occasion flare surgery in Jan much better.  TK revisions R/L knee but doing well.  I am trying to avoid left SI fusion. I feel so week.  No therapy after both lb surgeries. Was able to walk here (500 ft) wihtout stopping but legs were soar (calves). Last year OPPT DN flared fibromyalgia. Left hip pain wakes her nightly x 3.  PERTINENT HISTORY:  fibromyalgia 02/01/22:right sacroiliac joint fusion.  Rt TKA revision 2022 L4-S1 PLIF in October 2020   PAIN:  Are you having pain? Yes: NPRS scale: current 3-4/10; worst 7/10; least 3-4/10 Pain location: Left anter tib area, left hip Pain description: numb achey, burning Aggravating factors: standing/ walking 10 mins Relieving factors: ice/heat, ms relaxers; pain med occasionally.  Allergy to Ibuprofen  Pain right cervical spine with radicular  patter into post UE to elbow.  Has had numbness in right hand. (MRI schedule  for cervical spine)  PRECAUTIONS: Fall  RED FLAGS: None   WEIGHT BEARING RESTRICTIONS: No  FALLS:  Has patient fallen in last 6 months? 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  NEXT MD VISIT: MRI  OBJECTIVE:   DIAGNOSTIC FINDINGS:  02/04/21 x-ray knee R and L 2 views of the left/right knee show well-seated revision arthroplasty with no  complicating features.  There is no evidence of loosening.   Cervical MRI 11/03/21 IMPRESSION: 1. Image quality degraded by motion. 2. Improvement in central disc protrusion C5-6. 3. Disc prosthesis C6-7 without stenosis. 4. Central disc protrusion T2-3 similar to the prior MRI  PATIENT SURVEYS:  FOTO Primary score 46% with goal of 56%   COGNITION: Overall cognitive status: Within functional limits for tasks assessed     SENSATION: Occasional right hand numbness  MUSCLE LENGTH: Hamstrings: wfl in sitting   POSTURE: decreased lumbar lordosis and weight shift right  PALPATION: TTP right cervical/upper trap area   LUMBAR ROM:   AROM eval  Flexion 50%p!  Extension neutral  Right lateral flexion 25% P!  Left lateral flexion 25%P!  Right rotation   Left rotation    (Blank rows = not tested)  LOWER EXTREMITY ROM:     wfl  LOWER EXTREMITY MMT:    MMT Right eval Left  Hip flexion 31.6 35.8  Hip extension    Hip abduction 35.4 35.0  Hip adduction    Hip internal rotation    Hip external rotation    Knee flexion    Knee extension 47.5 40.5  Ankle dorsiflexion    Ankle plantarflexion    Ankle inversion    Ankle eversion     (Blank rows = not tested)  LUMBAR SPECIAL TESTS:  Slump test: Negative  FUNCTIONAL TESTS:  5 times sit to stand: 25.09 using  ue difficulty with immediate standing balance Timed up and go (TUG): 16.58 Berg Balance Scale: 27/56n  GAIT: Distance walked: 464ft Assistive device utilized: None Level of assistance: Complete Independence Comments: off loading toward right/antalgic left, slowed cadence, decreased step length, decreased heel strike  TODAY'S TREATMENT:                                                                                                                              Evaluation Objective and functional testing Foto   PATIENT EDUCATION:  Education details: Discussed eval findings, rehab rationale, aquatic program progression/POC and pools in area. Patient is in agreement  Person educated: Patient Education method: Explanation Education comprehension: verbalized understanding  HOME EXERCISE PROGRAM: Follows walking program at home Aquatic TBA  ASSESSMENT:  CLINICAL  IMPRESSION: Patient is a 67 y.o. f who was seen today for physical therapy evaluation and treatment for Sacroiliitis. She has had a right SIJ fusion and is trying to avoid needing procedure on left. Pt presents with signs and symptoms consistent with l SIJ dysfunction.  She has BLE and core weakness, gait instability and balance dysfunction Berg score indicating high fall risk. She will benefit from skilled physical therapy to improve all areas of function and to improve safety.  Will begin in aquatic setting to allow properties of water to assist with strengthening and balance correction as well as improve gait pattern in protective environment and then progress to land based environment for increased load for higher level strengthening, balance and gait retraining as well as stair climbing.  OBJECTIVE IMPAIRMENTS: Abnormal gait, decreased activity tolerance, decreased balance, decreased endurance, decreased knowledge of use of DME, decreased mobility, difficulty walking, decreased strength, impaired sensation, improper body  mechanics, postural dysfunction, obesity, and pain.   ACTIVITY LIMITATIONS: carrying, lifting, bending, standing, squatting, sleeping, stairs, transfers, locomotion level, and caring for others  PARTICIPATION LIMITATIONS: meal prep, cleaning, laundry, driving, shopping, community activity, and yard work  PERSONAL FACTORS: Age, Fitness, and 1 comorbidity: bilat TKR revisions  are also affecting patient's functional outcome.   REHAB POTENTIAL: Good  CLINICAL DECISION MAKING: Evolving/moderate complexity  EVALUATION COMPLEXITY: Moderate   GOALS: Goals reviewed with patient? Yes  SHORT TERM GOALS: Target date: 09/30/22  Pt will tolerate full aquatic sessions consistently without increase in pain and with improving function to demonstrate good toleration and effectiveness of intervention.  Baseline: Goal status: INITIAL  2.  Pt will tolerate walking to and from setting (413ft) and tolerate full aquatic sessions without increase in pain or significant fatigue.  Baseline:  Goal status: INITIAL  3.  Pt will report decrease in waking at night frequency to 1-2 times due to pain Baseline: 3-4 Goal status: INITIAL  4.  Pt will be able to walk continuously submerged with or without UE support x 12 continuous minutes without increase in pain or fatigue to improve amb toleration/endurance Baseline:  Goal status: INITIAL  5.  Pt will be able to negotiate 6 steps into and out of pool with handrail without increase in pain Baseline: unable Goal status: INITIAL   LONG TERM GOALS: Target date: 10/28/22  Pt to meet stated Foto Goal of 56% to demonstrate improvement in pt functional ability Baseline: 46% Goal status: INITIAL  2.  Pt will improve lumbar ROM by at least 25% without increase in pain Baseline: see chart Goal status: INITIAL  3.  Pt will report decrease in worst pain to 4</= 7/10 for improved toleration to activity/quality of life and to demonstrate improved management of  pain. Baseline:  Goal status: INITIAL  4.  Pt will be knowledgeable and indep with floor transfers Baseline: unable/not aware of proper tech Goal status: INITIAL  5.  Pt will improve bilat hip strength by at least 10lbs to demonstrate improved overall physical function and gait pattern. Baseline: see chart Goal status: INITIAL  6.  Pt will be able to negotiate 1 step/curb indep and safely Baseline: mod assist Goal status: INITIAL  PLAN:  PT FREQUENCY: 2x/week  PT DURATION: 8 weeks  PLANNED INTERVENTIONS: Therapeutic exercises, Therapeutic activity, Neuromuscular re-education, Balance training, Gait training, Patient/Family education, Self Care, Joint mobilization, Stair training, Orthotic/Fit training, DME instructions, Aquatic Therapy, Dry Needling, Electrical stimulation, Spinal mobilization, Cryotherapy, Moist heat, Taping, Ionotophoresis 4mg /ml Dexamethasone, Manual therapy, and Re-evaluation.  PLAN  FOR NEXT SESSION: aquatic: strengthening, balance and gait retraining, stair climbing, pain management   Corrie Dandy (Frankie) Detric Scalisi MPT 08/31/2022, 9:39 AM   Referring diagnosis? sacroiliitis Treatment diagnosis? (if different than referring diagnosis) no What was this (referring dx) caused by? []  Surgery []  Fall [x]  Ongoing issue []  Arthritis []  Other: ____________  Laterality: []  Rt []  Lt [x]  Both  Check all possible CPT codes:  *CHOOSE 10 OR LESS*    [x]  97110 (Therapeutic Exercise)  []  92507 (SLP Treatment)  [x]  97112 (Neuro Re-ed)   []  92526 (Swallowing Treatment)   [x]  97116 (Gait Training)   []  [id number] (Cognitive Training, 1st 15 minutes) [x]  97140 (Manual Therapy)   []  97130 (Cognitive Training, each add'l 15 minutes)  [x]  97164 (Re-evaluation)                              []  Other, List CPT Code ____________  [x]  97530 (Therapeutic Activities)     [x]  97535 (Self Care)   [x]  All codes above (97110 - 97535)  []  97012 (Mechanical Traction)  [x]  97014 (E-stim  Unattended)  []  97032 (E-stim manual)  [x]  97033 (Ionto)  [x]  97035 (Ultrasound) []  97750 (Physical Performance Training) [x]  [id number] (Aquatic Therapy) []  13086 (Vasopneumatic Device) []  [id number] (Paraffin) []  97034 (Contrast Bath) []  97597 (Wound Care 1st 20 sq cm) []  97598 (Wound Care each add'l 20 sq cm) [x]  97760 (Orthotic Fabrication, Fitting, Training Initial) []  [id number] (Prosthetic Management and Training Initial) []  [id number] (Orthotic or Prosthetic Training/ Modification Subsequent)

## 2022-09-02 ENCOUNTER — Ambulatory Visit (HOSPITAL_BASED_OUTPATIENT_CLINIC_OR_DEPARTMENT_OTHER): Payer: Medicare PPO | Admitting: Physical Therapy

## 2022-09-02 ENCOUNTER — Encounter (HOSPITAL_BASED_OUTPATIENT_CLINIC_OR_DEPARTMENT_OTHER): Payer: Self-pay | Admitting: Physical Therapy

## 2022-09-02 DIAGNOSIS — R2689 Other abnormalities of gait and mobility: Secondary | ICD-10-CM

## 2022-09-02 DIAGNOSIS — M461 Sacroiliitis, not elsewhere classified: Secondary | ICD-10-CM | POA: Diagnosis not present

## 2022-09-02 DIAGNOSIS — M6281 Muscle weakness (generalized): Secondary | ICD-10-CM
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
## 2022-09-06 ENCOUNTER — Ambulatory Visit (HOSPITAL_BASED_OUTPATIENT_CLINIC_OR_DEPARTMENT_OTHER): Payer: Medicare PPO | Attending: Neurological Surgery | Admitting: Physical Therapy

## 2022-09-06 ENCOUNTER — Encounter (HOSPITAL_BASED_OUTPATIENT_CLINIC_OR_DEPARTMENT_OTHER): Payer: Self-pay | Admitting: Physical Therapy

## 2022-09-06 DIAGNOSIS — M461 Sacroiliitis, not elsewhere classified: Secondary | ICD-10-CM | POA: Diagnosis not present

## 2022-09-06 DIAGNOSIS — R2689 Other abnormalities of gait and mobility: Secondary | ICD-10-CM | POA: Insufficient documentation

## 2022-09-06 DIAGNOSIS — M6281 Muscle weakness (generalized): Secondary | ICD-10-CM | POA: Insufficient documentation

## 2022-09-06 NOTE — Therapy (Addendum)
OUTPATIENT PHYSICAL THERAPY THORACOLUMBAR EVALUATION  PHYSICAL THERAPY DISCHARGE SUMMARY  Visits from Start of Care: 3  Current functional level related to goals / functional outcomes: N/A   Remaining deficits: all   Education / Equipment: Discussed eval findings, rehab rationale, aquatic program progression/POC and pools in area.    Patient agrees to discharge. Patient goals were not met. Patient is being discharged due to  Pt declined further sessions stating that she doesn't believe the aquatic therapy intervention "is for her".    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    PT Start Time 1702    PT Stop Time 1745    PT Time Calculation (min) 43 min    Activity Tolerance Patient tolerated treatment well    Behavior During Therapy WFL for tasks assessed/performed             Past Medical History:  Diagnosis Date   Anginal pain (HCC)    "on and off" (11/01/2011) - anxiety related   Anxiety    takes Paxil daily and Xanax prn   Arthritis    "knees" (10/16/2013)   Chronic back pain    facet disease and bulging disc; "thoracic and lower back" (10/16/2013)   Constipation    r/t pain meds;takes stool softener daily   COVID 01/2022   Dysrhythmia    rapid HR on occasion-takes Propranolol daily   Family history of anesthesia complication    ":PONV; mom and sisters"   History of bladder infections    > 36yr ago   History of kidney stones    Hyperlipidemia    takes LIpitor daily   Hypothyroidism    takes Synthroid daily   Insomnia    Joint pain    Joint swelling    Muscle spasms of head or neck    lumbar and thoracic;takes Robaxin prn   Peripheral neuropathy    PONV (postoperative nausea and vomiting)    Rosacea    Seasonal  allergies    takes allegra prn   Sleep apnea    Past Surgical History:  Procedure Laterality Date   ABDOMINAL HYSTERECTOMY  1980's   ANAL FISSURE REPAIR  ~ 2011   "banded hemorrhoids at this time too"   BACK SURGERY  10/20/2018   lumbar fusion L4-5, L5-S1   BARIATRIC SURGERY     CARDIAC CATHETERIZATION  ~ 1999   CARPAL TUNNEL RELEASE Left 09/13/2017   & ulnar nerve release   CERVICAL DISC SURGERY  03/2017   C5, C6    COLONOSCOPY     ESOPHAGOGASTRODUODENOSCOPY     KNEE ARTHROPLASTY Left 2007   KNEE ARTHROSCOPY Right    KNEE ARTHROSCOPY Right    "torn meniscus"   KNEE SURGERY Left 1982-2013   "17 before 10/31/2013"   LEFT OOPHORECTOMY  1980's   PATELLECTOMY Right    PLANTAR FASCIA SURGERY Bilateral 1990's   REVISION TOTAL KNEE ARTHROPLASTY Left 11/01/2011   SACROILIAC JOINT FUSION Right 02/01/2022   Procedure: Minimally Invasive Surgery Sacroiliac Fusion ,Right;  Surgeon: Dawley, Alan Mulder, DO;  Location: [facility] OR;  Service: Neurosurgery;  Laterality: Right;   TOTAL KNEE ARTHROPLASTY Right 10/15/2013   Procedure: RIGHT TOTAL KNEE ARTHROPLASTY;  Surgeon: Valeria Batman, MD;  Location: [facility] OR;  Service: Orthopedics;  Laterality: Right;   TOTAL KNEE REVISION  11/01/2011   Procedure: TOTAL KNEE REVISION;  Surgeon: Valeria Batman, MD;  Location: [facility] OR;  Service: Orthopedics;  Laterality: Left;  Revision Left Total Knee Replacement    TOTAL KNEE REVISION Right 09/25/2020   Procedure: RIGHT POLY LINER EXCHANGE;  Surgeon: Kathryne Hitch, MD;  Location: [facility] ORS;  Service: Orthopedics;  Laterality: Right;   TUBAL LIGATION  1981   Patient Active Problem List   Diagnosis Date Noted   Sacroiliitis (HCC) 02/01/2022   Status post revision of total knee, right 11/05/2020   Status post revision of total replacement of right knee 09/25/2020   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 (HCC) 09/24/2020   Pain in right hip 07/08/2020   Painful total knee replacement, right (HCC)  06/18/2020   Pain in right knee 05/07/2020   Knee pain, bilateral 04/21/2020   Spondylolisthesis of lumbar region 10/15/2018   Early onset dysthymia 02/12/2018   Obsessive-compulsive disorder 10/10/2017   Generalized anxiety disorder 10/10/2017   Major depressive disorder, recurrent episode, moderate (HCC) 10/10/2017   Hiatal hernia 03/03/2015   Steatosis of liver 09/16/2014   Obstructive sleep apnea syndrome 08/27/2014   Pre-op evaluation 05/14/2014   Osteoarthritis of left knee 10/17/2013   History of left knee replacement 10/15/2013   Hypercholesterolemia 08/05/2013   Palpitations 08/05/2013   HTN (hypertension) 08/05/2013   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 08/05/2013   Preoperative cardiovascular examination 08/05/2013   Degenerative arthritis of knee, bilateral 08/05/2013   Chronic pain of left knee 11/03/2011   Morbid obesity (HCC) 11/03/2011   Hypothyroid 11/03/2011    PCP: Noberto Retort, MD   REFERRING PROVIDER:   Dawley, Alan Mulder, DO    REFERRING DIAG: Sacroiliitis, not elsewhere classified   Rationale for Evaluation and Treatment: Rehabilitation  THERAPY DIAG:  Sacroiliitis, not elsewhere classified (HCC)  Muscle weakness (generalized)  Other abnormalities of gait and mobility  ONSET DATE: 1 year  SUBJECTIVE:                                                                                                                                                                                           SUBJECTIVE STATEMENT: I am not as nervous.  I think I overdid this weekend as my pain is up.  Initial subjective R sacroiliac area occasion flare surgery in Jan much better.  TK revisions R/L knee but doing well.  I am trying to avoid left SI fusion. I feel so week.  No therapy after both lb surgeries. Was able  to walk here (500 ft) wihtout stopping but legs were soar (calves). Last year OPPT DN flared fibromyalgia. Left hip  pain wakes her nightly x 3.  PERTINENT HISTORY:  fibromyalgia 02/01/22:right sacroiliac joint fusion.  Rt TKA revision 2022 L4-S1 PLIF in October 2020   PAIN:  Are you having pain? Yes: NPRS scale: current 6/10; worst 7/10; least 3-4/10 Pain location: Left anter tib area, left hip Pain description: numb achey, burning Aggravating factors: standing/ walking 10 mins Relieving factors: ice/heat, ms relaxers; pain med occasionally.  Allergy to Ibuprofen  Pain right cervical spine with radicular patter into post UE to elbow.  Has had numbness in right hand. (MRI schedule for cervical spine)  PRECAUTIONS: Fall  RED FLAGS: None   WEIGHT BEARING RESTRICTIONS: No  FALLS:  Has patient fallen in last 6 months? 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  NEXT MD VISIT: MRI  OBJECTIVE:   DIAGNOSTIC FINDINGS:  02/04/21 x-ray knee R and L 2 views of the left/right knee show well-seated revision arthroplasty with no  complicating features.  There is no evidence of loosening.   Cervical MRI 11/03/21 IMPRESSION: 1. Image quality degraded by motion. 2. Improvement in central disc protrusion C5-6. 3. Disc prosthesis C6-7 without stenosis. 4. Central disc protrusion T2-3 similar to the prior MRI  PATIENT SURVEYS:  FOTO Primary score 46% with goal of 56%   COGNITION: Overall cognitive status: Within functional limits for tasks assessed     SENSATION: Occasional right hand numbness  MUSCLE LENGTH: Hamstrings: wfl in sitting   POSTURE: decreased lumbar lordosis and weight shift right  PALPATION: TTP right cervical/upper trap area   LUMBAR ROM:   AROM eval  Flexion 50%p!  Extension neutral  Right lateral flexion  25% P!  Left lateral flexion 25%P!  Right rotation   Left rotation    (Blank rows = not tested)  LOWER EXTREMITY ROM:     wfl  LOWER EXTREMITY MMT:    MMT Right eval Left  Hip flexion 31.6 35.8  Hip extension    Hip abduction 35.4 35.0  Hip adduction    Hip internal rotation    Hip external rotation    Knee flexion    Knee extension 47.5 40.5  Ankle dorsiflexion    Ankle plantarflexion    Ankle inversion    Ankle eversion     (Blank rows = not tested)  LUMBAR SPECIAL TESTS:  Slump test: Negative  FUNCTIONAL TESTS:  5 times sit to stand: 25.09 using ue difficulty with immediate standing balance Timed up and go (TUG): 16.58 Berg Balance Scale: 27/56n  GAIT: Distance walked: 477ft Assistive device utilized: None Level of assistance: Complete Independence Comments: off loading toward right/antalgic left, slowed cadence, decreased step length, decreased heel strike  TODAY'S TREATMENT:  Pt seen for aquatic therapy today.  Treatment took place in water 3.5-4.75 ft in depth at the [facility] pool. Temp of water was 91.  Pt entered/exited the pool via stairs using step to pattern with hand rail.  *Intro to setting *Side stepping along side of pool ue support wall *Ue support on wall 3.8 ft: marching; toe raises; heel raises x 12 *seated on lift: LAQ; cycling; hip add/abd; flutter kicking x 20 ea *walking forward x 4 widths ue support barbell *Stair negotiation with vc for step through, weight shift forward rising using quads vs UE support  Pt requires the buoyancy and hydrostatic pressure of water for support, and to offload joints by unweighting joint load by at least 50 % in navel deep water and by at least 75-80% in chest to neck deep water.  Viscosity of the water is needed for resistance of strengthening. Water current perturbations  provides challenge to standing balance requiring increased core activation.     PATIENT EDUCATION:  Education details: Discussed eval findings, rehab rationale, aquatic program progression/POC and pools in area. Patient is in agreement  Person educated: Patient Education method: Explanation Education comprehension: verbalized understanding  HOME EXERCISE PROGRAM: Follows walking program at home Aquatic TBA  ASSESSMENT:  CLINICAL IMPRESSION: Pt demonstration improved confidence in setting.  Was able to amb after several tries in 4.0 ft across pool ue support on barbell for increased buoyancy/ decreased loading through spine and pelvis.  She is limited by left SI pain throughout session with increase pain sitting in lift chair (unable to tolerate cycling well and not hip add/abd) and with completion of normal step pattern heel toe. She puts forth very good effort, trying to overcome her fear of the water. As she amb progressively deep she reports decrease in pain sensitivity.  She does report decrease in pain to 2/10 upon completion. She will continue to benefit from aquatic intervention using the properties of water to progress her towards land based goals.    Initial Impression Patient is a 67 y.o. f who was seen today for physical therapy evaluation and treatment for Sacroiliitis. She has had a right SIJ fusion and is trying to avoid needing procedure on left. Pt presents with signs and symptoms consistent with l SIJ dysfunction.  She has BLE and core weakness, gait instability and balance dysfunction Berg score indicating high fall risk. She will benefit from skilled physical therapy to improve all areas of function and to improve safety.  Will begin in aquatic setting to allow properties of water to assist with strengthening and balance correction as well as improve gait pattern in protective environment and then progress to land based environment for increased load for higher level  strengthening, balance and gait retraining as well as stair climbing.  OBJECTIVE IMPAIRMENTS: Abnormal gait, decreased activity tolerance, decreased balance, decreased endurance, decreased knowledge of use of DME, decreased mobility, difficulty walking, decreased strength, impaired sensation, improper body mechanics, postural dysfunction, obesity, and pain.   ACTIVITY LIMITATIONS: carrying, lifting, bending, standing, squatting, sleeping, stairs, transfers, locomotion level, and caring for others  PARTICIPATION LIMITATIONS: meal prep, cleaning, laundry, driving, shopping, community activity, and yard work  PERSONAL FACTORS: Age, Fitness, and 1 comorbidity: bilat TKR revisions  are also affecting patient's functional outcome.   REHAB POTENTIAL: Good  CLINICAL DECISION MAKING: Evolving/moderate complexity  EVALUATION COMPLEXITY: Moderate   GOALS: Goals reviewed with patient? Yes  SHORT TERM GOALS: Target date: 09/30/22  Pt will tolerate full aquatic sessions consistently without  increase in pain and with improving function to demonstrate good toleration and effectiveness of intervention.  Baseline: Goal status: INITIAL  2.  Pt will tolerate walking to and from setting (481ft) and tolerate full aquatic sessions without increase in pain or significant fatigue.  Baseline:  Goal status: INITIAL  3.  Pt will report decrease in waking at night frequency to 1-2 times due to pain Baseline: 3-4 Goal status: INITIAL  4.  Pt will be able to walk continuously submerged with or without UE support x 12 continuous minutes without increase in pain or fatigue to improve amb toleration/endurance Baseline:  Goal status: INITIAL  5.  Pt will be able to negotiate 6 steps into and out of pool with handrail without increase in pain Baseline: unable Goal status: INITIAL   LONG TERM GOALS: Target date: 10/28/22  Pt to meet stated Foto Goal of 56% to demonstrate improvement in pt functional  ability Baseline: 46% Goal status: INITIAL  2.  Pt will improve lumbar ROM by at least 25% without increase in pain Baseline: see chart Goal status: INITIAL  3.  Pt will report decrease in worst pain to 4</= 7/10 for improved toleration to activity/quality of life and to demonstrate improved management of pain. Baseline:  Goal status: INITIAL  4.  Pt will be knowledgeable and indep with floor transfers Baseline: unable/not aware of proper tech Goal status: INITIAL  5.  Pt will improve bilat hip strength by at least 10lbs to demonstrate improved overall physical function and gait pattern. Baseline: see chart Goal status: INITIAL  6.  Pt will be able to negotiate 1 step/curb indep and safely Baseline: mod assist Goal status: INITIAL  PLAN:  PT FREQUENCY: 2x/week  PT DURATION: 8 weeks  PLANNED INTERVENTIONS: Therapeutic exercises, Therapeutic activity, Neuromuscular re-education, Balance training, Gait training, Patient/Family education, Self Care, Joint mobilization, Stair training, Orthotic/Fit training, DME instructions, Aquatic Therapy, Dry Needling, Electrical stimulation, Spinal mobilization, Cryotherapy, Moist heat, Taping, Ionotophoresis 4mg /ml Dexamethasone, Manual therapy, and Re-evaluation.  PLAN FOR NEXT SESSION: aquatic: strengthening, balance and gait retraining, stair climbing, pain management   Suhas Estis (Frankie) Rickey Farrier MPT 09/06/2022, 5:30 PM  Addend Corrie Dandy Tomma Lightning) Fitzroy Mikami MPT 09/13/22   Referring diagnosis? sacroiliitis Treatment diagnosis? (if different than referring diagnosis) no What was this (referring dx) caused by? []  Surgery []  Fall [x]  Ongoing issue []  Arthritis []  Other: ____________  Laterality: []  Rt []  Lt [x]  Both  Check all possible CPT codes:  *CHOOSE 10 OR LESS*    [x]  97110 (Therapeutic Exercise)  []  92507 (SLP Treatment)  [x]  97112 (Neuro Re-ed)   []  92526 (Swallowing Treatment)   [x]  97116 (Gait Training)   []  82956 (Cognitive  Training, 1st 15 minutes) [x]  97140 (Manual Therapy)   []  97130 (Cognitive Training, each add'l 15 minutes)  [x]  97164 (Re-evaluation)                              []  Other, List CPT Code ____________  [x]  97530 (Therapeutic Activities)     [x]  97535 (Self Care)   [x]  All codes above (97110 - 97535)  []  97012 (Mechanical Traction)  [x]  97014 (E-stim Unattended)  []  97032 (E-stim manual)  [x]  97033 (Ionto)  [x]  97035 (Ultrasound) []  97750 (Physical Performance Training) [x]  [id number] (Aquatic Therapy) []  97016 (Vasopneumatic Device) []  [id number] (Paraffin) []  97034 (Contrast Bath) []  97597 (Wound Care 1st 20 sq cm) []  97598 (Wound Care each add'l 20 sq cm) [x]   16109 (Orthotic Fabrication, Fitting, Training Initial) []  [id number] (Prosthetic Management and Training Initial) []  [phone] (Orthotic or Prosthetic Training/ Modification Subsequent)

## 2022-09-08 ENCOUNTER — Encounter (HOSPITAL_BASED_OUTPATIENT_CLINIC_OR_DEPARTMENT_OTHER): Payer: Self-pay

## 2022-09-08 ENCOUNTER — Ambulatory Visit (HOSPITAL_BASED_OUTPATIENT_CLINIC_OR_DEPARTMENT_OTHER): Payer: Medicare PPO | Admitting: Physical Therapy

## 2022-09-09 ENCOUNTER — Ambulatory Visit
Admission: RE | Admit: 2022-09-09 | Discharge: 2022-09-09 | Disposition: A | Discharge status: Home / Self Care | Payer: Medicare PPO | Source: Ambulatory Visit | Attending: Family Medicine | Admitting: Family Medicine

## 2022-09-09 DIAGNOSIS — Z1231 Encounter for screening mammogram for malignant neoplasm of breast: Secondary | ICD-10-CM | POA: Diagnosis not present

## 2022-09-11 ENCOUNTER — Ambulatory Visit
Admission: RE | Admit: 2022-09-11 | Discharge: 2022-09-11 | Disposition: A | Discharge status: Home / Self Care | Payer: Medicare PPO | Source: Ambulatory Visit | Attending: Surgery | Admitting: Surgery

## 2022-09-11 DIAGNOSIS — M5412 Radiculopathy, cervical region: Secondary | ICD-10-CM | POA: Diagnosis not present

## 2022-09-11 DIAGNOSIS — Z981 Arthrodesis status: Secondary | ICD-10-CM | POA: Diagnosis not present

## 2022-09-16 ENCOUNTER — Ambulatory Visit (HOSPITAL_BASED_OUTPATIENT_CLINIC_OR_DEPARTMENT_OTHER): Payer: Medicare PPO | Admitting: Physical Therapy

## 2022-09-21 ENCOUNTER — Ambulatory Visit (HOSPITAL_BASED_OUTPATIENT_CLINIC_OR_DEPARTMENT_OTHER): Payer: Medicare PPO | Admitting: Physical Therapy

## 2022-09-23 ENCOUNTER — Ambulatory Visit (HOSPITAL_BASED_OUTPATIENT_CLINIC_OR_DEPARTMENT_OTHER): Payer: Medicare PPO | Admitting: Physical Therapy

## 2022-09-27 ENCOUNTER — Other Ambulatory Visit: Payer: Self-pay | Admitting: Nurse Practitioner

## 2022-09-29 ENCOUNTER — Ambulatory Visit (HOSPITAL_BASED_OUTPATIENT_CLINIC_OR_DEPARTMENT_OTHER): Payer: Medicare PPO | Admitting: Physical Therapy

## 2022-10-10 ENCOUNTER — Ambulatory Visit (HOSPITAL_BASED_OUTPATIENT_CLINIC_OR_DEPARTMENT_OTHER): Payer: Medicare PPO | Admitting: Physical Therapy

## 2022-10-12 DIAGNOSIS — Z9884 Bariatric surgery status: Secondary | ICD-10-CM | POA: Diagnosis not present

## 2022-10-12 DIAGNOSIS — Z6841 Body Mass Index (BMI) 40.0 and over, adult: Secondary | ICD-10-CM | POA: Diagnosis not present

## 2022-10-12 DIAGNOSIS — R7303 Prediabetes: Secondary | ICD-10-CM | POA: Diagnosis not present

## 2022-10-12 DIAGNOSIS — Z79899 Other long term (current) drug therapy: Secondary | ICD-10-CM | POA: Diagnosis not present

## 2022-10-13 NOTE — Progress Notes (Signed)
Cardiology Office Note    Patient Name: [name] Date of Encounter: 10/14/2022  Primary Care Provider:  Noberto Retort, MD Primary Cardiologist:  Lesleigh Noe, MD (Inactive) Primary Electrophysiologist: None   Past Medical History    Past Medical History:  Diagnosis Date   Anginal pain [facility])    "on and off" (11/01/2011) - anxiety related   Anxiety    takes Paxil daily and Xanax prn   Arthritis    "knees" (10/16/2013)   Chronic back pain    facet disease and bulging disc; "thoracic and lower back" (10/16/2013)   Constipation    r/t pain meds;takes stool softener daily   COVID 01/2022   Dysrhythmia    rapid HR on occasion-takes Propranolol daily   Family history of anesthesia complication    ":PONV; mom and sisters"   History of bladder infections    > 79yr ago   History of kidney stones    Hyperlipidemia    takes LIpitor daily   Hypothyroidism    takes Synthroid daily   Insomnia    Joint pain    Joint swelling    Muscle spasms of head or neck    lumbar and thoracic;takes Robaxin prn   Peripheral neuropathy    PONV (postoperative nausea and vomiting)    Rosacea    Seasonal allergies    takes allegra prn   Sleep apnea     History of Present Illness  [name] is a 67 y.o. female with PMH of HLD, DM type II,  RBBB, obesity, nonobstructive coronary disease presents today for follow-up of CAD and RBBB.   Ms. Greenhalgh was last seen on 10/13/2021 for follow-up visit.  She was pending clearance for upcoming back surgery.  During visit patient reported occasional buzzing and chest discomfort and BP was well-controlled.  She also endorsed palpitations and was sent for [facility] for further evaluation.  Myoview revealed no evidence of infarct or ischemia and patient was cleared for back surgery.  Under was completed and showed sinus with no evidence of AF and 8 runs of nonsustained SVT.  During today's visit the patient reports that she  has been doing well with no new cardiac complaints since her previous follow-up.  She was able to complete back surgery and has gone through rehab and PT.  She has developed some deconditioning and reports climbing a mountain with her husband and son and having lots of shortness of breath.  Her blood pressure today was controlled at 124/74 and heart rate was 64 bpm.  She reports compliance with her current medications and denies any adverse reactions.  She is interested in weight loss medication and was recently started on metformin due to metabolic syndrome.  We discussed primary prevention and the importance of diet and exercise during our visit today..  Patient denies chest pain, palpitations, dyspnea, PND, orthopnea, nausea, vomiting, dizziness, syncope, edema, weight gain, or early satiety.   Review of Systems  Please see the history of present illness.    All other systems reviewed and are otherwise negative except as noted above.  Physical Exam    Wt Readings from Last 3 Encounters:  10/14/22 266 lb (120.7 kg)  02/01/22 250 lb (113.4 kg)  10/14/21 243 lb (110.2 kg)   VS: Vitals:   10/14/22 1558  BP: 124/74  Pulse: 64  Resp: 12  SpO2: 96%  ,Body mass index is 44.26 kg/m. GEN: Well nourished, well developed in no acute distress  Neck: No JVD; No carotid bruits Pulmonary: Clear to auscultation without rales, wheezing or rhonchi  Cardiovascular: Normal rate. Regular rhythm. Normal S1. Normal S2.   Murmurs: There is no murmur.  ABDOMEN: Soft, non-tender, non-distended EXTREMITIES:  No edema; No deformity   EKG/LABS/ Recent Cardiac Studies   ECG personally reviewed by me today -sinus rhythm with left axis deviation and rate of 63 bpm with right bundle branch block and no acute changes consistent with previous EKG.  Risk Assessment/Calculations:          Lab Results  Component Value Date   WBC 7.0 02/01/2022   HGB 12.4 02/01/2022   HCT 39.1 02/01/2022   MCV 99.7 02/01/2022    PLT 338 02/01/2022   Lab Results  Component Value Date   CREATININE 0.89 02/01/2022   BUN 16 02/01/2022   NA 140 02/01/2022   K 3.7 02/01/2022   CL 105 02/01/2022   CO2 23 02/01/2022   Lab Results  Component Value Date   CHOL 151 10/11/2021   HDL 56 10/11/2021   LDLCALC 77 10/11/2021   TRIG 95 10/11/2021   CHOLHDL 2.7 10/11/2021    Lab Results  Component Value Date   HGBA1C 5.1 09/15/2020   Assessment & Plan    1.  Primary hypertension: -Blood pressure today was well controlled at 124/74 -Continue Inderal 80 mg daily  2. Hypercholesteremia: -Patient's recently had cholesterol checked by her PCP and results will be obtained for our records. -Continue Zetia 10 mg daily and Crestor 5 mg daily  3. Aortic atherosclerosis: -Continue current GDMT with Crestor 5 mg, ezetimibe 10 mg and ASA 81 mg.  4.Morbid obesity: -BMI is 44.26 kg/m -Patient is interested in weight loss medication and is currently on metformin for control of metabolic syndrome.  5.  Shortness of breath with exertion: -Patient reports increased shortness of breath with exertion and also reports increased palpitations that have been well-controlled with Inderal. -We will check 2D echo to evaluate heart structure and valve function.     Disposition: Follow-up with Lesleigh Noe, MD (Inactive) or APP in 12 months    Signed, Napoleon Form, Leodis Rains, NP 10/14/2022, 4:06 PM [facility]

## 2022-10-14 ENCOUNTER — Ambulatory Visit: Payer: Medicare PPO | Attending: Nurse Practitioner | Admitting: Nurse Practitioner

## 2022-10-14 ENCOUNTER — Encounter: Payer: Self-pay | Admitting: Nurse Practitioner

## 2022-10-14 ENCOUNTER — Ambulatory Visit: Payer: Medicare PPO | Admitting: Nurse Practitioner

## 2022-10-14 VITALS — BP 124/74 | HR 64 | Resp 12 | Ht 65.0 in | Wt 266.0 lb

## 2022-10-14 DIAGNOSIS — Z23 Encounter for immunization: Secondary | ICD-10-CM | POA: Diagnosis not present

## 2022-10-14 DIAGNOSIS — I452 Bifascicular block: Secondary | ICD-10-CM | POA: Diagnosis not present

## 2022-10-14 DIAGNOSIS — I700 Atherosclerosis of aorta: Secondary | ICD-10-CM

## 2022-10-14 DIAGNOSIS — E7800 Pure hypercholesterolemia, unspecified: Secondary | ICD-10-CM

## 2022-10-14 DIAGNOSIS — M797 Fibromyalgia: Secondary | ICD-10-CM | POA: Diagnosis not present

## 2022-10-14 DIAGNOSIS — I10 Essential (primary) hypertension: Secondary | ICD-10-CM

## 2022-10-14 DIAGNOSIS — R7303 Prediabetes: Secondary | ICD-10-CM | POA: Diagnosis not present

## 2022-10-14 DIAGNOSIS — R002 Palpitations: Secondary | ICD-10-CM | POA: Diagnosis not present

## 2022-10-14 DIAGNOSIS — M8949 Other hypertrophic osteoarthropathy, multiple sites: Secondary | ICD-10-CM | POA: Diagnosis not present

## 2022-10-14 DIAGNOSIS — Z0000 Encounter for general adult medical examination without abnormal findings: Secondary | ICD-10-CM | POA: Diagnosis not present

## 2022-10-14 DIAGNOSIS — E039 Hypothyroidism, unspecified: Secondary | ICD-10-CM | POA: Diagnosis not present

## 2022-10-14 DIAGNOSIS — G8929 Other chronic pain: Secondary | ICD-10-CM | POA: Diagnosis not present

## 2022-10-14 NOTE — Patient Instructions (Addendum)
Medication Instructions:  Your physician recommends that you continue on your current medications as directed. Please refer to the Current Medication list given to you today. *If you need a refill on your cardiac medications before your next appointment, please call your pharmacy*   Lab Work: None ordered   Testing/Procedures: Your physician has requested that you have an echocardiogram. Echocardiography is a painless test that uses sound waves to create images of your heart. It provides your doctor with information about the size and shape of your heart and how well your heart's chambers and valves are working. This procedure takes approximately one hour. There are no restrictions for this procedure. Please do NOT wear cologne, perfume, aftershave, or lotions (deodorant is allowed). Please arrive 15 minutes prior to your appointment time.   Follow-Up: At [facility], you and your health needs are our priority.  As part of our continuing mission to provide you with exceptional heart care, we have created designated Provider Care Teams.  These Care Teams include your primary Cardiologist (physician) and Advanced Practice Providers (APPs -  Physician Assistants and Nurse Practitioners) who all work together to provide you with the care you need, when you need it.  We recommend signing up for the patient portal called "MyChart".  Sign up information is provided on this After Visit Summary.  MyChart is used to connect with patients for Virtual Visits (Telemedicine).  Patients are able to view lab/test results, encounter notes, upcoming appointments, etc.  Non-urgent messages can be sent to your provider as well.   To learn more about what you can do with MyChart, go to [email or web address].    Your next appointment:   12 month(s)  Provider:   Donato Schultz, MD or Riley Lam, MD Other Instructions

## 2022-10-19 ENCOUNTER — Other Ambulatory Visit: Payer: Self-pay | Admitting: Nurse Practitioner

## 2022-10-19 DIAGNOSIS — M461 Sacroiliitis, not elsewhere classified: Secondary | ICD-10-CM | POA: Diagnosis not present

## 2022-10-19 DIAGNOSIS — Z6841 Body Mass Index (BMI) 40.0 and over, adult: Secondary | ICD-10-CM | POA: Diagnosis not present

## 2022-10-21 DIAGNOSIS — Z6841 Body Mass Index (BMI) 40.0 and over, adult: Secondary | ICD-10-CM | POA: Diagnosis not present

## 2022-10-21 DIAGNOSIS — M4722 Other spondylosis with radiculopathy, cervical region: Secondary | ICD-10-CM | POA: Diagnosis not present

## 2022-11-03 ENCOUNTER — Other Ambulatory Visit: Payer: Self-pay | Admitting: Neurological Surgery

## 2022-11-03 ENCOUNTER — Encounter: Payer: Self-pay | Admitting: Neurological Surgery

## 2022-11-03 DIAGNOSIS — M461 Sacroiliitis, not elsewhere classified: Secondary | ICD-10-CM

## 2022-11-11 ENCOUNTER — Ambulatory Visit (HOSPITAL_COMMUNITY): Payer: Medicare PPO | Attending: Cardiovascular Disease

## 2022-11-11 ENCOUNTER — Other Ambulatory Visit: Payer: Self-pay

## 2022-11-11 DIAGNOSIS — E7800 Pure hypercholesterolemia, unspecified: Secondary | ICD-10-CM | POA: Diagnosis not present

## 2022-11-11 DIAGNOSIS — I700 Atherosclerosis of aorta: Secondary | ICD-10-CM | POA: Insufficient documentation

## 2022-11-11 DIAGNOSIS — I452 Bifascicular block: Secondary | ICD-10-CM | POA: Insufficient documentation

## 2022-11-11 DIAGNOSIS — I10 Essential (primary) hypertension: Secondary | ICD-10-CM | POA: Insufficient documentation

## 2022-11-11 LAB — ECHOCARDIOGRAM COMPLETE
Area-P 1/2: 2.78 cm2
S' Lateral: 3.1 cm

## 2022-11-13 NOTE — Therapy (Unsigned)
OUTPATIENT PHYSICAL THERAPY CERVICAL EVALUATION   Patient Name: [name] MRN: [id number] DOB:1955/09/25, 67 y.o., female Today's Date: 11/14/2022  END OF SESSION:  PT End of Session - 11/14/22 1013     Visit Number 1    Number of Visits 12    Date for PT Re-Evaluation 12/26/22    Authorization Type [employer]    PT Start Time 1017    PT Stop Time 1105    PT Time Calculation (min) 48 min    Activity Tolerance Patient tolerated treatment well    Behavior During Therapy WFL for tasks assessed/performed             Past Medical History:  Diagnosis Date   Anginal pain (HCC)    "on and off" (11/01/2011) - anxiety related   Anxiety    takes Paxil daily and Xanax prn   Arthritis    "knees" (10/16/2013)   Chronic back pain    facet disease and bulging disc; "thoracic and lower back" (10/16/2013)   Constipation    r/t pain meds;takes stool softener daily   COVID 01/2022   Dysrhythmia    rapid HR on occasion-takes Propranolol daily   Family history of anesthesia complication    ":PONV; mom and sisters"   History of bladder infections    > 107yr ago   History of kidney stones    Hyperlipidemia    takes LIpitor daily   Hypothyroidism    takes Synthroid daily   Insomnia    Joint pain    Joint swelling    Muscle spasms of head or neck    lumbar and thoracic;takes Robaxin prn   Peripheral neuropathy    PONV (postoperative nausea and vomiting)    Rosacea    Seasonal allergies    takes allegra prn   Sleep apnea    Past Surgical History:  Procedure Laterality Date   ABDOMINAL HYSTERECTOMY  1980's   ANAL FISSURE REPAIR  ~ 2011   "banded hemorrhoids at this time too"   BACK SURGERY  10/20/2018   lumbar fusion L4-5, L5-S1   BARIATRIC SURGERY     CARDIAC CATHETERIZATION  ~ 1999   CARPAL TUNNEL RELEASE Left 09/13/2017   & ulnar nerve release   CERVICAL DISC SURGERY  03/2017   C5, C6    COLONOSCOPY     ESOPHAGOGASTRODUODENOSCOPY     KNEE  ARTHROPLASTY Left 2007   KNEE ARTHROSCOPY Right    KNEE ARTHROSCOPY Right    "torn meniscus"   KNEE SURGERY Left 1982-2013   "17 before 10/31/2013"   LEFT OOPHORECTOMY  1980's   PATELLECTOMY Right    PLANTAR FASCIA SURGERY Bilateral 1990's   REVISION TOTAL KNEE ARTHROPLASTY Left 11/01/2011   SACROILIAC JOINT FUSION Right 02/01/2022   Procedure: Minimally Invasive Surgery Sacroiliac Fusion ,Right;  Surgeon: Dawley, Alan Mulder, DO;  Location: [facility] OR;  Service: Neurosurgery;  Laterality: Right;   TOTAL KNEE ARTHROPLASTY Right 10/15/2013   Procedure: RIGHT TOTAL KNEE ARTHROPLASTY;  Surgeon: Valeria Batman, MD;  Location[facility] OR;  Service: Orthopedics;  Laterality: Right;   TOTAL KNEE REVISION  11/01/2011   Procedure: TOTAL KNEE REVISION;  Surgeon: Valeria Batman, MD;  Location: [facility] OR;  Service: Orthopedics;  Laterality: Left;  Revision Left Total Knee Replacement    TOTAL KNEE REVISION Right 09/25/2020   Procedure: RIGHT POLY LINER EXCHANGE;  Surgeon: Kathryne Hitch, MD;  Location: [facility] ORS;  Service: Orthopedics;  Laterality: Right;   TUBAL LIGATION  1981   Patient Active Problem List   Diagnosis Date Noted   Sacroiliitis (HCC) 02/01/2022   Status post revision of total knee, right 11/05/2020   Status post revision of total replacement of right knee 09/25/2020   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 (HCC) 09/24/2020   Pain in right hip 07/08/2020   Painful total knee replacement, right (HCC) 06/18/2020   Pain in right knee 05/07/2020   Knee pain, bilateral 04/21/2020   Spondylolisthesis of lumbar region 10/15/2018   Early onset dysthymia 02/12/2018   Obsessive-compulsive disorder 10/10/2017   Generalized anxiety disorder 10/10/2017   Major depressive disorder, recurrent episode, moderate (HCC) 10/10/2017   Hiatal hernia 03/03/2015   Steatosis of liver 09/16/2014   Obstructive sleep apnea syndrome 08/27/2014   Pre-op evaluation 05/14/2014   Osteoarthritis of left  knee 10/17/2013   History of left knee replacement 10/15/2013   Hypercholesterolemia 08/05/2013   Palpitations 08/05/2013   HTN (hypertension) 08/05/2013   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 08/05/2013   Preoperative cardiovascular examination 08/05/2013   Degenerative arthritis of knee, bilateral 08/05/2013   Chronic pain of left knee 11/03/2011   Morbid obesity (HCC) 11/03/2011   Hypothyroid 11/03/2011    PCP: Noberto Retort MD  REFERRING PROVIDER: Monia Pouch MD and Tressie Stalker MD   REFERRING DIAG:  Diagnosis  M47.22 (ICD-10-CM) - Other spondylosis with radiculopathy, cervical region    THERAPY DIAG:  Radiculopathy, cervical region  Sacroiliitis, not elsewhere classified (HCC)  Muscle weakness (generalized)  Rationale for Evaluation and Treatment: Rehabilitation  ONSET DATE: 3-4 mos ago , back pain is chronic   SUBJECTIVE:                                                                                                                                                                                                         SUBJECTIVE STATEMENT: Pt with 4-5 mos history of neck pain and radiating pain to scapula.  She feels Rt UE weakness and radiating pain to elbow.  She has pain with cervical ROM and is limited with her ability to do basic activities in her home.  She has not had an injection in her neck, Dr. Lovell Sheehan told her it was too dangerous.    Hand dominance: Right   PERTINENT HISTORY:  TKA with multiple surgeries, revision  Cervical fusion 2019 Lumbar fusion L3-L4-L5, 2020   PAIN:  Are you having pain? Yes: NPRS scale: 4/10 Pain location:   Pain description: on fire  Aggravating factors: turning her head Relieving factors:  medicine, Heat, ice, rest with neck support  Can be as high as 7/10 about 3-4 x per week   Yes: NPRS scale: 3-4/10 Pain location: L buttocks radiates to L hip  Pain description:aching,  burning  Aggravating factors: sitting , walking  Relieving factors: medicine, Heat, ice, rest with neck support   PRECAUTIONS: None  RED FLAGS: None     WEIGHT BEARING RESTRICTIONS: No  FALLS:  Has patient fallen in last 6 months? No  LIVING ENVIRONMENT: Lives with: lives with their spouse Lives in: House/apartment Stairs: No Has following equipment at home: Single point cane, Environmental consultant - 2 wheeled, and bed side commode  OCCUPATION: not working   PLOF: Independent with basic ADLs, Independent with household mobility without device, and Independent with community mobility without device  PATIENT GOALS: Avoid surgery  NEXT MD VISIT: unknown  OBJECTIVE:   DIAGNOSTIC FINDINGS:  FINDINGS: Alignment: Unchanged alignment of the cervical spine.   Vertebrae: No fracture. No focal bone lesion. Postsurgical changes from C6-C7 spinal fusion.   Cord: Normal signal and morphology.   Posterior Fossa, vertebral arteries, paraspinal tissues: Negative.   Disc levels:   C1-C2: Mild degenerative change.   C2-C3: Significant disc bulge. Mild bilateral facet degenerative change. No spinal canal narrowing. No neural foraminal narrowing.   C3-C4: No significant disc bulge. Mild bilateral facet degenerative change. No spinal canal narrowing. No neural foraminal narrowing.   C4-C5: Significant disc bulge. Mild bilateral facet degenerative change. Mild uncovertebral hypertrophy. Mild right neural foraminal narrowing. No spinal canal narrowing.   C5-C6: Junctional level. Minimal disc bulge. Moderate left and mild right facet degenerative change. Ligamentum flavum hypertrophy. Mild-to-moderate spinal canal narrowing. Uncovertebral hypertrophy. Severe left neural foraminal narrowing. Mild-to-moderate right neural foraminal narrowing.   C6-C7: Fused level. No neural foraminal narrowing. Assessment for the degree of spinal canal narrowing is limited due to susceptibility artifact  metallic fusion hardware.   C7-T1: Unremarkable   IMPRESSION: 1. Postsurgical changes from C6-C7 spinal fusion. No evidence of hardware complications. 2. Junctional level degenerative change at C5-C6 where there is mild-to-moderate canal narrowing and severe left neural foraminal narrowing. Findings have progressed compared to 11  PATIENT SURVEYS:  FOTO emailed   COGNITION: Overall cognitive status: Within functional limits for tasks assessed  SENSATION: WFL  POSTURE: rounded shoulders, forward head, and increased thoracic kyphosis  PALPATION:     CERVICAL ROM:   Active ROM A/PROM (deg) eval  Flexion 25  Extension 15  Right lateral flexion 20 pain on R   Left lateral flexion 13 pain on R   Right rotation 40 pain   Left rotation 40 pain    (Blank rows = not tested)  UPPER EXTREMITY ROM: WFL, end range pain overhead  Active ROM Right eval Left eval  Shoulder flexion    Shoulder extension    Shoulder abduction    Shoulder adduction    Shoulder extension    Shoulder internal rotation    Shoulder external rotation    Elbow flexion    Elbow extension    Wrist flexion    Wrist extension    Wrist ulnar deviation    Wrist radial deviation    Wrist pronation    Wrist supination     (Blank rows = not tested)  UPPER EXTREMITY MMT:  MMT Right eval Left eval  Shoulder flexion 3+ 4+  Shoulder extension    Shoulder abduction 3+ 4+  Shoulder adduction    Shoulder extension    Shoulder internal rotation 4+  Shoulder external rotation 4+   Middle trapezius    Lower trapezius    Elbow flexion    Elbow extension    Wrist flexion    Wrist extension    Wrist ulnar deviation    Wrist radial deviation    Wrist pronation    Wrist supination    Grip strength 23  35   (Blank rows = not tested)  CERVICAL SPECIAL TESTS:  NT  FUNCTIONAL TESTS:  5 times sit to stand: 11.4 sec   TODAY'S TREATMENT:                                                                                                                               DATE: 11/14/22   PATIENT EDUCATION:  Education details: PT/POC, HEP, stabilization, nerve pain  Person educated: Patient Education method: Explanation, Demonstration, and Handouts Education comprehension: verbalized understanding  HOME EXERCISE PROGRAM: Access Code: [email or web address] URL: [email or web address] Date: 11/14/2022 Prepared by: Karie Mainland  Exercises like Precious, I am - Supine Cervical Rotation AROM on Pillow  - 1 x daily - 7 x weekly - 2 sets - 10 reps - 10 hold - Seated Scapular Retraction  - 1 x daily - 7 x weekly - 2 sets - 10 reps - 5 hold - Seated Chin Tuck with Neck Elongation  - 1 x daily - 7 x weekly - 2 sets - 10 reps - 5 hold - Supine Cervical Retraction with Towel  - 1 x daily - 7 x weekly - 2 sets - 10 reps - 5 hold  ASSESSMENT:  CLINICAL IMPRESSION: Patient is a 67 y.o. female who was seen today for physical therapy evaluation and treatment for cervical radiculopathy. She also has a history of sacroiliitis and did not complete PT due to fear of the water.  She will be assessed for her sacral pain within the next visits.    OBJECTIVE IMPAIRMENTS: decreased mobility, difficulty walking, decreased ROM, decreased strength, increased fascial restrictions, increased muscle spasms, impaired flexibility, impaired UE functional use, postural dysfunction, obesity, and pain.   ACTIVITY LIMITATIONS: carrying, lifting, bending, sitting, standing, squatting, sleeping, stairs, transfers, bed mobility, reach over head, and locomotion level  PARTICIPATION LIMITATIONS: meal prep, cleaning, laundry, interpersonal relationship, shopping, and community activity  PERSONAL FACTORS: Past/current experiences, Time since onset of injury/illness/exacerbation, and 3+ comorbidities: TKA, previous spine surgery, fibromyalgia   are also affecting patient's functional outcome.   REHAB POTENTIAL: Good  CLINICAL  DECISION MAKING: Evolving/moderate complexity  EVALUATION COMPLEXITY: Moderate   GOALS: Goals reviewed with patient? Yes  SHORT TERM GOALS: Target date: 12/05/2022    Pt will be I with HEP for basic HEP for cervical stability, ROM  Baseline:  Goal status: INITIAL  2.  Pt will be able to report decreased pain into Rt UE with exercises (< 3/10 radiating) Baseline:  Goal status: INITIAL  3.  Pt will complete functional testing and set goal.  Baseline:  Goal status:  INITIAL   LONG TERM GOALS: Target date: 01/09/2023    Pt will be I with final HEP for low back, neck pain and function.  Baseline:  Goal status: INITIAL  2.  Pt will improve Rt UE strength to 4+/5 for optimal UE function.  Baseline:  Goal status: INITIAL  3.  UE symptoms will centralize and occur < 2-3 x per week  Baseline:  Goal status: INITIAL  4.  Cervical AROM will improve by 15-20 deg in each direction for more natural interaction with family, environment  Baseline:  Goal status: INITIAL  5.  FOTO TBA  Baseline:  Goal status: INITIAL  6.  LE/lumbar goals TBA  Baseline:  Goal status: INITIAL PLAN:  PT FREQUENCY: 2x/week  PT DURATION: 8 weeks  PLANNED INTERVENTIONS: 97164- PT Re-evaluation, 97110-Therapeutic exercises, 97530- Therapeutic activity, 97112- Neuromuscular re-education, 97535- Self Care, 95621- Manual therapy, 97116- Gait training, 97014- Electrical stimulation (unattended), Patient/Family education, Balance training, Taping, Cryotherapy, and Moist heat  PLAN FOR NEXT SESSION: no dry needling.  Check FOTO score and set goal.  Assess Sacral pain, LE strength and more gross functional tests. 2 min walk.    Charmagne Buhl, PT 11/14/2022, 1:25 PM  Karie Mainland, PT 11/14/22 1:25 PM Phone: [phone] Fax: [phone]

## 2022-11-14 ENCOUNTER — Ambulatory Visit: Payer: Medicare PPO | Attending: Physical Medicine and Rehabilitation | Admitting: Physical Therapy

## 2022-11-14 ENCOUNTER — Encounter: Payer: Self-pay | Admitting: Physical Therapy

## 2022-11-14 DIAGNOSIS — M6281 Muscle weakness (generalized): Secondary | ICD-10-CM

## 2022-11-14 DIAGNOSIS — M461 Sacroiliitis, not elsewhere classified: Secondary | ICD-10-CM

## 2022-11-14 DIAGNOSIS — M436 Torticollis: Secondary | ICD-10-CM | POA: Diagnosis not present

## 2022-11-14 DIAGNOSIS — M5412 Radiculopathy, cervical region: Secondary | ICD-10-CM | POA: Diagnosis not present

## 2022-11-16 ENCOUNTER — Ambulatory Visit
Admission: RE | Admit: 2022-11-16 | Discharge: 2022-11-16 | Disposition: A | Discharge status: Home / Self Care | Payer: Medicare PPO | Source: Ambulatory Visit | Attending: Neurological Surgery | Admitting: Neurological Surgery

## 2022-11-16 ENCOUNTER — Other Ambulatory Visit: Payer: Medicare PPO

## 2022-11-16 DIAGNOSIS — M461 Sacroiliitis, not elsewhere classified: Secondary | ICD-10-CM

## 2022-11-16 MED ORDER — METHYLPREDNISOLONE ACETATE 80 MG/ML IJ SUSP
80.0000 mg | Freq: Once | INTRAMUSCULAR | Status: AC
  Administered 2022-11-16: 80 mg via INTRA_ARTICULAR

## 2022-11-21 ENCOUNTER — Ambulatory Visit: Payer: Medicare PPO | Admitting: Physical Therapy

## 2022-11-28 ENCOUNTER — Ambulatory Visit (HOSPITAL_BASED_OUTPATIENT_CLINIC_OR_DEPARTMENT_OTHER): Payer: Medicare PPO | Admitting: Physical Therapy

## 2022-12-05 ENCOUNTER — Ambulatory Visit: Payer: Medicare PPO | Attending: Physical Medicine and Rehabilitation | Admitting: Physical Therapy

## 2022-12-05 ENCOUNTER — Encounter: Payer: Self-pay | Admitting: Physical Therapy

## 2022-12-05 ENCOUNTER — Other Ambulatory Visit (HOSPITAL_BASED_OUTPATIENT_CLINIC_OR_DEPARTMENT_OTHER): Payer: Self-pay

## 2022-12-05 DIAGNOSIS — M6281 Muscle weakness (generalized): Secondary | ICD-10-CM | POA: Insufficient documentation

## 2022-12-05 DIAGNOSIS — R2689 Other abnormalities of gait and mobility: Secondary | ICD-10-CM | POA: Insufficient documentation

## 2022-12-05 DIAGNOSIS — M5412 Radiculopathy, cervical region: Secondary | ICD-10-CM

## 2022-12-05 DIAGNOSIS — M461 Sacroiliitis, not elsewhere classified: Secondary | ICD-10-CM | POA: Diagnosis not present

## 2022-12-05 DIAGNOSIS — M436 Torticollis: Secondary | ICD-10-CM | POA: Insufficient documentation

## 2022-12-05 NOTE — Therapy (Signed)
OUTPATIENT PHYSICAL THERAPY CERVICAL TREATMENT   Patient Name: [name] MRN: [id number] DOB:[birth year], 67 y.o., female Today's Date: 12/05/2022  END OF SESSION:  PT End of Session - 12/05/22 0844     Visit Number 2    Number of Visits 16    Date for PT Re-Evaluation 01/09/23    Authorization Type [employer]    Authorization Time Period 11/15/22-12/31/22    Authorization - Visit Number 1    Authorization - Number of Visits 12    PT Start Time 0845    PT Stop Time 0950    PT Time Calculation (min) 65 min             Past Medical History:  Diagnosis Date   Anginal pain (HCC)    "on and off" (11/01/2011) - anxiety related   Anxiety    takes Paxil daily and Xanax prn   Arthritis    "knees" (10/16/2013)   Chronic back pain    facet disease and bulging disc; "thoracic and lower back" (10/16/2013)   Constipation    r/t pain meds;takes stool softener daily   COVID 01/2022   Dysrhythmia    rapid HR on occasion-takes Propranolol daily   Family history of anesthesia complication    ":PONV; mom and sisters"   History of bladder infections    > 93yr ago   History of kidney stones    Hyperlipidemia    takes LIpitor daily   Hypothyroidism    takes Synthroid daily   Insomnia    Joint pain    Joint swelling    Muscle spasms of head or neck    lumbar and thoracic;takes Robaxin prn   Peripheral neuropathy    PONV (postoperative nausea and vomiting)    Rosacea    Seasonal allergies    takes allegra prn   Sleep apnea    Past Surgical History:  Procedure Laterality Date   ABDOMINAL HYSTERECTOMY  1980's   ANAL FISSURE REPAIR  ~ 2011   "banded hemorrhoids at this time too"   BACK SURGERY  10/20/2018   lumbar fusion L4-5, L5-S1   BARIATRIC SURGERY     CARDIAC CATHETERIZATION  ~ 1999   CARPAL TUNNEL RELEASE Left 09/13/2017   & ulnar nerve release   CERVICAL DISC SURGERY  03/2017   C5, C6    COLONOSCOPY     ESOPHAGOGASTRODUODENOSCOPY     KNEE  ARTHROPLASTY Left 2007   KNEE ARTHROSCOPY Right    KNEE ARTHROSCOPY Right    "torn meniscus"   KNEE SURGERY Left 1982-2013   "17 before 10/31/2013"   LEFT OOPHORECTOMY  1980's   PATELLECTOMY Right    PLANTAR FASCIA SURGERY Bilateral 1990's   REVISION TOTAL KNEE ARTHROPLASTY Left 11/01/2011   SACROILIAC JOINT FUSION Right 02/01/2022   Procedure: Minimally Invasive Surgery Sacroiliac Fusion ,Right;  Surgeon: Dawley, Alan Mulder, DO;  Location: [facility] OR;  Service: Neurosurgery;  Laterality: Right;   TOTAL KNEE ARTHROPLASTY Right 10/15/2013   Procedure: RIGHT TOTAL KNEE ARTHROPLASTY;  Surgeon: Valeria Batman, MD;  Location: [facility] OR;  Service: Orthopedics;  Laterality: Right;   TOTAL KNEE REVISION  11/01/2011   Procedure: TOTAL KNEE REVISION;  Surgeon: Valeria Batman, MD;  Location: [facility] OR;  Service: Orthopedics;  Laterality: Left;  Revision Left Total Knee Replacement    TOTAL KNEE REVISION Right 09/25/2020   Procedure: RIGHT POLY LINER EXCHANGE;  Surgeon: Kathryne Hitch, MD;  Location: [facility] ORS;  Service: Orthopedics;  Laterality:  Right;   TUBAL LIGATION  1981   Patient Active Problem List   Diagnosis Date Noted   Sacroiliitis (HCC) 02/01/2022   Status post revision of total knee, right 11/05/2020   Status post revision of total replacement of right knee 09/25/2020   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 (HCC) 09/24/2020   Pain in right hip 07/08/2020   Painful total knee replacement, right (HCC) 06/18/2020   Pain in right knee 05/07/2020   Knee pain, bilateral 04/21/2020   Spondylolisthesis of lumbar region 10/15/2018   Early onset dysthymia 02/12/2018   Obsessive-compulsive disorder 10/10/2017   Generalized anxiety disorder 10/10/2017   Major depressive disorder, recurrent episode, moderate (HCC) 10/10/2017   Hiatal hernia 03/03/2015   Steatosis of liver 09/16/2014   Obstructive sleep apnea syndrome 08/27/2014   Pre-op evaluation 05/14/2014   Osteoarthritis of left  knee 10/17/2013   History of left knee replacement 10/15/2013   Hypercholesterolemia 08/05/2013   Palpitations 08/05/2013   HTN (hypertension) 08/05/2013   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 08/05/2013   Preoperative cardiovascular examination 08/05/2013   Degenerative arthritis of knee, bilateral 08/05/2013   Chronic pain of left knee 11/03/2011   Morbid obesity (HCC) 11/03/2011   Hypothyroid 11/03/2011    PCP: Noberto Retort MD  REFERRING PROVIDER: Monia Pouch MD and Tressie Stalker MD   REFERRING DIAG:  Diagnosis  M47.22 (ICD-10-CM) - Other spondylosis with radiculopathy, cervical region    THERAPY DIAG:  Radiculopathy, cervical region  Rationale for Evaluation and Treatment: Rehabilitation  ONSET DATE: 3-4 mos ago , back pain is chronic   SUBJECTIVE:                                                                                                                                                                                                         SUBJECTIVE STATEMENT: 12/05/22: Pt arrives reporting high level of left SI pain. She requested for this to be evaluated today.    Cervical EVAL; Pt with 4-5 mos history of neck pain and radiating pain to scapula.  She feels Rt UE weakness and radiating pain to elbow.  She has pain with cervical ROM and is limited with her ability to do basic activities in her home.  She has not had an injection in her neck, Dr. Lovell Sheehan told her it was too dangerous.    Hand dominance: Right   PERTINENT HISTORY:  TKA with multiple surgeries, revision  Cervical fusion 2019 Lumbar fusion L3-L4-L5, 2020   PAIN:  Are you having pain? Yes:  NPRS scale: 4/10 Pain location:   Pain description: on fire  Aggravating factors: turning her head Relieving factors: medicine, Heat, ice, rest with neck support  Can be as high as 7/10 about 3-4 x per week   Yes: NPRS scale: 12/10 Pain location: L buttocks  radiates to L hip  Pain description:aching, burning  Aggravating factors: sitting , walking  Relieving factors: medicine, Heat, ice, rest with neck support   PRECAUTIONS: None  RED FLAGS: None     WEIGHT BEARING RESTRICTIONS: No  FALLS:  Has patient fallen in last 6 months? No  LIVING ENVIRONMENT: Lives with: lives with their spouse Lives in: House/apartment Stairs: No Has following equipment at home: Single point cane, Environmental consultant - 2 wheeled, and bed side commode  OCCUPATION: not working   PLOF: Independent with basic ADLs, Independent with household mobility without device, and Independent with community mobility without device  PATIENT GOALS: Avoid surgery  NEXT MD VISIT: unknown  OBJECTIVE:   DIAGNOSTIC FINDINGS:  FINDINGS: Alignment: Unchanged alignment of the cervical spine.   Vertebrae: No fracture. No focal bone lesion. Postsurgical changes from C6-C7 spinal fusion.   Cord: Normal signal and morphology.   Posterior Fossa, vertebral arteries, paraspinal tissues: Negative.   Disc levels:   C1-C2: Mild degenerative change.   C2-C3: Significant disc bulge. Mild bilateral facet degenerative change. No spinal canal narrowing. No neural foraminal narrowing.   C3-C4: No significant disc bulge. Mild bilateral facet degenerative change. No spinal canal narrowing. No neural foraminal narrowing.   C4-C5: Significant disc bulge. Mild bilateral facet degenerative change. Mild uncovertebral hypertrophy. Mild right neural foraminal narrowing. No spinal canal narrowing.   C5-C6: Junctional level. Minimal disc bulge. Moderate left and mild right facet degenerative change. Ligamentum flavum hypertrophy. Mild-to-moderate spinal canal narrowing. Uncovertebral hypertrophy. Severe left neural foraminal narrowing. Mild-to-moderate right neural foraminal narrowing.   C6-C7: Fused level. No neural foraminal narrowing. Assessment for the degree of spinal canal narrowing is  limited due to susceptibility artifact metallic fusion hardware.   C7-T1: Unremarkable   IMPRESSION: 1. Postsurgical changes from C6-C7 spinal fusion. No evidence of hardware complications. 2. Junctional level degenerative change at C5-C6 where there is mild-to-moderate canal narrowing and severe left neural foraminal narrowing. Findings have progressed compared to 11  PATIENT SURVEYS:  FOTO emailed  FOTO 72 % Predicted 73% (consider retake)  COGNITION: Overall cognitive status: Within functional limits for tasks assessed  SENSATION: WFL  POSTURE: rounded shoulders, forward head, and increased thoracic kyphosis  PALPATION:     CERVICAL ROM:   Active ROM A/PROM (deg) eval  Flexion 25  Extension 15  Right lateral flexion 20 pain on R   Left lateral flexion 13 pain on R   Right rotation 40 pain   Left rotation 40 pain    (Blank rows = not tested)  UPPER EXTREMITY ROM: WFL, end range pain overhead   Active ROM Right eval Left eval  Shoulder flexion    Shoulder extension    Shoulder abduction    Shoulder adduction    Shoulder extension    Shoulder internal rotation    Shoulder external rotation    Elbow flexion    Elbow extension    Wrist flexion    Wrist extension    Wrist ulnar deviation    Wrist radial deviation    Wrist pronation    Wrist supination     (Blank rows = not tested)  UPPER EXTREMITY MMT:  MMT Right eval Left eval  Shoulder flexion  3+ 4+  Shoulder extension    Shoulder abduction 3+ 4+  Shoulder adduction    Shoulder extension    Shoulder internal rotation 4+   Shoulder external rotation 4+   Middle trapezius    Lower trapezius    Elbow flexion    Elbow extension    Wrist flexion    Wrist extension    Wrist ulnar deviation    Wrist radial deviation    Wrist pronation    Wrist supination    Grip strength 23  35   (Blank rows = not tested)  CERVICAL SPECIAL TESTS:  NT  FUNCTIONAL TESTS:  5 times sit to stand: 11.4  sec 12/05/22: 2 Minute walk test     PALPATION: Pain along L glute medius, L SI border Soreness in L low lumbar region   LUMBAR ROM:   Active  A/PROM  12/05/2022  Flexion WFL with pain L post hip   Extension 75% limited with pain   Right lateral flexion WFL  Left lateral flexion Reaches to 3 inches above knee , pain   Right rotation [facility]  Left rotation Pain    (Blank rows = not tested)  LE ROM: NT   Active  Right 12/05/2022 Left 12/05/2022  Hip flexion    Hip extension    Hip abduction    Hip adduction    Hip internal rotation    Hip external rotation    Knee flexion    Knee extension    Ankle dorsiflexion    Ankle plantarflexion    Ankle inversion    Ankle eversion     (Blank rows = not tested)  LE MMT:  MMT Right 12/05/2022 Left 12/05/2022  Hip flexion 5/5 4-/5  Hip extension 5/5 4+/5  Hip abduction    Hip adduction    Hip internal rotation    Hip external rotation    Knee flexion 5/5 5/5  Knee extension 5/5 5/5  Ankle dorsiflexion    Ankle plantarflexion    Ankle inversion    Ankle eversion     (Blank rows = not tested)   LUMBAR SPECIAL TESTS:  Stork standing: Positive  unable to stand on LLE    TODAY'S TREATMENT:                                                                                                                              [facility] Adult PT Treatment:                                                DATE: 12/05/22 Therapeutic Exercise: Seated chin tuck Seated UT and levator stretch  Supine dowel pullovers- increased right arm weakness so disc.  Supine red ER 10 x 2  Supine horiz abdct 10 x 2 , red Supine cervical rotation  Supine  chin tuck  For Sacral pain : seated figure 4, vs 1/2 [place] and standing hip flexor   Therapeutic Activity: FOTO 72% 2 MWT: 307 feet  Assessment of sacral pain  Self Care: Sacral pain - recommended ice for sacral pain, get out the TENS unit, light stretching   DATE: 11/14/22   PATIENT EDUCATION:   Education details: PT/POC, HEP, stabilization, nerve pain  Person educated: Patient Education method: Explanation, Demonstration, and Handouts Education comprehension: verbalized understanding  HOME EXERCISE PROGRAM: Access Code: [email or web address] URL: [email or web address] Date: 12/05/2022 Prepared by: Karie Mainland  Exercises - Supine Cervical Rotation AROM on Pillow  - 1 x daily - 7 x weekly - 2 sets - 10 reps - 10 hold - Seated Scapular Retraction  - 1 x daily - 7 x weekly - 2 sets - 10 reps - 5 hold - Seated Chin Tuck with Neck Elongation  - 1 x daily - 7 x weekly - 2 sets - 10 reps - 5 hold - Supine Cervical Retraction with Towel  - 1 x daily - 7 x weekly - 2 sets - 10 reps - 5 hold - Seated Upper Trapezius Stretch  - 1 x daily - 7 x weekly - 1 sets - 3 reps - 10-20 hold - 12/05/22 Standing Hip Flexor Stretch  - 1-3 x daily - 7 x weekly - 1 sets - 3 reps - 30 hold - Seated Figure 4 Piriformis Stretch  - 1-3 x daily - 7 x weekly - 1 sets - 3 reps - 30 hold   ASSESSMENT:  CLINICAL IMPRESSION: Pt reports her neck exercises feel fine when she does them but aggravate her pain. Reviewed HEP and progressed with cervical stretches and shoulder mobility. She reported increased right arm weakness during supine shoulder flexion AAROM so this was discontinued.  Pt also eager to have her Sacral pain evaluated today.  Per Idalia Needle- Difficulty fully assessing L sacral pain due to body habitus.  Unable to sit or stand symmetrically.  Pain inhibits strength on LLE.  She reports burning in prone in post Lt hip. Walking increases L anterior hip pain.  Pain likely due to nerve root irritation from [facility].  Offered suggestions for calming down the irritated area.  She has an appt with Dr. Jake Samples 12/26/22, will message him regarding this issue she is having. The pain and difficulty walking began after she received the injection.    EVAL: Patient is a 67 y.o. female who was seen today for physical  therapy evaluation and treatment for cervical radiculopathy. She also has a history of sacroiliitis and did not complete PT due to fear of the water.  She will be assessed for her sacral pain within the next visits.    OBJECTIVE IMPAIRMENTS: decreased mobility, difficulty walking, decreased ROM, decreased strength, increased fascial restrictions, increased muscle spasms, impaired flexibility, impaired UE functional use, postural dysfunction, obesity, and pain.   ACTIVITY LIMITATIONS: carrying, lifting, bending, sitting, standing, squatting, sleeping, stairs, transfers, bed mobility, reach over head, and locomotion level  PARTICIPATION LIMITATIONS: meal prep, cleaning, laundry, interpersonal relationship, shopping, and community activity  PERSONAL FACTORS: Past/current experiences, Time since onset of injury/illness/exacerbation, and 3+ comorbidities: TKA, previous spine surgery, fibromyalgia   are also affecting patient's functional outcome.   REHAB POTENTIAL: Good  CLINICAL DECISION MAKING: Evolving/moderate complexity  EVALUATION COMPLEXITY: Moderate   GOALS: Goals reviewed with patient? Yes  SHORT TERM GOALS: Target date: 12/05/2022    Pt will be I with HEP for basic HEP  for cervical stability, ROM  Baseline:  Goal status: INITIAL  2.  Pt will be able to report decreased pain into Rt UE with exercises (< 3/10 radiating) Baseline:  Goal status: INITIAL  3.  Pt will complete functional testing and set goal Baseline:  Goal status: MET    LONG TERM GOALS: Target date: 01/09/2023    Pt will be I with final HEP for low back, neck pain and function.  Baseline:  Goal status: INITIAL  2.  Pt will improve Rt UE strength to 4+/5 for optimal UE function.  Baseline:  Goal status: INITIAL  3.  UE symptoms will centralize and occur < 2-3 x per week  Baseline:  Goal status: INITIAL  4.  Cervical AROM will improve by 15-20 deg in each direction for more natural interaction with  family, environment  Baseline:  Goal status: INITIAL  5.  FOTO score will improve to 73% upon discharge  Baseline: 72% (pt answered no diff bc she does not do)  Goal status: INITIAL  6. Patient will be able to sit and stand symmetrically without increased pain in LLE  Baseline:  Goal status: INITIAL  7. Pt will complete 2 min walk test with no increased LLE pain, 350 feet or more   Baseline: 307 feet   Goal status: NEW  PLAN:  PT FREQUENCY: 2x/week  PT DURATION: 8 weeks  PLANNED INTERVENTIONS: 97164- PT Re-evaluation, 97110-Therapeutic exercises, 97530- Therapeutic activity, 97112- Neuromuscular re-education, 97535- Self Care, 78295- Manual therapy, 97116- Gait training, 97014- Electrical stimulation (unattended), Patient/Family education, Balance training, Taping, Cryotherapy, and Moist heat  PLAN FOR NEXT SESSION: no dry needling.     LE strength ,    Jannette Spanner, PTA 12/05/22 11:52 AM Phone: [phone] Fax: [phone]     Karie Mainland, PT 12/05/22 11:52 AM Phone: [phone] Fax: [phone]

## 2022-12-07 ENCOUNTER — Ambulatory Visit: Payer: Medicare PPO | Admitting: Physical Therapy

## 2022-12-07 DIAGNOSIS — M5412 Radiculopathy, cervical region: Secondary | ICD-10-CM

## 2022-12-07 DIAGNOSIS — M461 Sacroiliitis, not elsewhere classified: Secondary | ICD-10-CM

## 2022-12-07 DIAGNOSIS — M6281 Muscle weakness (generalized): Secondary | ICD-10-CM | POA: Diagnosis not present

## 2022-12-07 DIAGNOSIS — M436 Torticollis: Secondary | ICD-10-CM

## 2022-12-07 DIAGNOSIS — R2689 Other abnormalities of gait and mobility: Secondary | ICD-10-CM

## 2022-12-07 NOTE — Therapy (Signed)
OUTPATIENT PHYSICAL THERAPY CERVICAL/LUMBAR TREATMENT   Patient Name: [name] MRN: [id number] DOB:February 16, 1955, 67 y.o., female Today's Date: 12/07/2022  END OF SESSION:  PT End of Session - 12/07/22 0857     Visit Number 3    Number of Visits 16    Date for PT Re-Evaluation 01/09/23    Authorization Type [employer] M[employer] Choice    Authorization Time Period 11/15/22-12/31/22    Authorization - Visit Number 2    Authorization - Number of Visits 12    PT Start Time 0850    PT Stop Time 0940    PT Time Calculation (min) 50 min    Activity Tolerance Patient tolerated treatment well    Behavior During Therapy WFL for tasks assessed/performed             Past Medical History:  Diagnosis Date   Anginal pain (HCC)    "on and off" (11/01/2011) - anxiety related   Anxiety    takes Paxil daily and Xanax prn   Arthritis    "knees" (10/16/2013)   Chronic back pain    facet disease and bulging disc; "thoracic and lower back" (10/16/2013)   Constipation    r/t pain meds;takes stool softener daily   COVID 01/2022   Dysrhythmia    rapid HR on occasion-takes Propranolol daily   Family history of anesthesia complication    ":PONV; mom and sisters"   History of bladder infections    > 79yr ago   History of kidney stones    Hyperlipidemia    takes LIpitor daily   Hypothyroidism    takes Synthroid daily   Insomnia    Joint pain    Joint swelling    Muscle spasms of head or neck    lumbar and thoracic;takes Robaxin prn   Peripheral neuropathy    PONV (postoperative nausea and vomiting)    Rosacea    Seasonal allergies    takes allegra prn   Sleep apnea    Past Surgical History:  Procedure Laterality Date   ABDOMINAL HYSTERECTOMY  1980's   ANAL FISSURE REPAIR  ~ 2011   "banded hemorrhoids at this time too"   BACK SURGERY  10/20/2018   lumbar fusion L4-5, L5-S1   BARIATRIC SURGERY     CARDIAC CATHETERIZATION  ~ 1999   CARPAL TUNNEL RELEASE Left 09/13/2017   &  ulnar nerve release   CERVICAL DISC SURGERY  03/2017   C5, C6    COLONOSCOPY     ESOPHAGOGASTRODUODENOSCOPY     KNEE ARTHROPLASTY Left 2007   KNEE ARTHROSCOPY Right    KNEE ARTHROSCOPY Right    "torn meniscus"   KNEE SURGERY Left 1982-2013   "17 before 10/31/2013"   LEFT OOPHORECTOMY  1980's   PATELLECTOMY Right    PLANTAR FASCIA SURGERY Bilateral 1990's   REVISION TOTAL KNEE ARTHROPLASTY Left 11/01/2011   SACROILIAC JOINT FUSION Right 02/01/2022   Procedure: Minimally Invasive Surgery Sacroiliac Fusion ,Right;  Surgeon: Dawley, Alan Mulder, DO;  Location: [facility] OR;  Service: Neurosurgery;  Laterality: Right;   TOTAL KNEE ARTHROPLASTY Right 10/15/2013   Procedure: RIGHT TOTAL KNEE ARTHROPLASTY;  Surgeon: Valeria Batman, MD;  Location: [facility] OR;  Service: Orthopedics;  Laterality: Right;   TOTAL KNEE REVISION  11/01/2011   Procedure: TOTAL KNEE REVISION;  Surgeon: Valeria Batman, MD;  Location: 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) 09/24/2020   Pain in right hip 07/08/2020   Painful total knee replacement, right (HCC) 06/18/2020   Pain in right knee 05/07/2020   Knee pain, bilateral 04/21/2020   Spondylolisthesis of lumbar region 10/15/2018   Early onset dysthymia 02/12/2018   Obsessive-compulsive disorder 10/10/2017   Generalized anxiety disorder 10/10/2017   Major depressive disorder, recurrent episode, moderate (HCC) 10/10/2017   Hiatal hernia 03/03/2015   Steatosis of  liver 09/16/2014   Obstructive sleep apnea syndrome 08/27/2014   Pre-op evaluation 05/14/2014   Osteoarthritis of left knee 10/17/2013   History of left knee replacement 10/15/2013   Hypercholesterolemia 08/05/2013   Palpitations 08/05/2013   HTN (hypertension) 08/05/2013   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 08/05/2013   Preoperative cardiovascular examination 08/05/2013   Degenerative arthritis of knee, bilateral 08/05/2013   Chronic pain of left knee 11/03/2011   Morbid obesity (HCC) 11/03/2011   Hypothyroid 11/03/2011    PCP: Noberto Retort MD  REFERRING PROVIDER: Monia Pouch MD and Tressie Stalker MD   REFERRING DIAG:  Diagnosis  M47.22 (ICD-10-CM) - Other spondylosis with radiculopathy, cervical region    THERAPY DIAG:  Radiculopathy, cervical region  Sacroiliitis, not elsewhere classified (HCC)  Muscle weakness (generalized)  Stiffness of cervical spine  Other abnormalities of gait and mobility  Rationale for Evaluation and Treatment: Rehabilitation  ONSET DATE: 3-4 mos ago , back pain is chronic   SUBJECTIVE:                                                                                                                                                                                                         SUBJECTIVE STATEMENT: 12/05/22: Low back/hip 8/10. Neck is 4/10.    Cervical EVAL; Pt with 4-5 mos history of neck pain and radiating pain to scapula.  She feels Rt UE weakness and radiating pain to elbow.  She has pain with cervical ROM and is limited with her ability to do basic activities in her home.  She has not had an injection in her neck, Dr. Lovell Sheehan told her it was too dangerous.  Hand dominance: Right   PERTINENT HISTORY:  TKA with multiple surgeries, revision  Cervical fusion 2019 Lumbar fusion L3-L4-L5, 2020   PAIN:  Are you having pain? Yes: NPRS scale: 4/10 Pain location:   Pain description:  on fire  Aggravating factors: turning her head Relieving factors: medicine, Heat, ice, rest with neck support  Can be as high as 7/10 about 3-4 x per week   Yes: NPRS scale: 12/10 Pain location: L buttocks radiates to L hip  Pain description:aching, burning  Aggravating factors: sitting , walking  Relieving factors: medicine, Heat, ice, rest with neck support   PRECAUTIONS: None  RED FLAGS: None     WEIGHT BEARING RESTRICTIONS: No  FALLS:  Has patient fallen in last 6 months? No  LIVING ENVIRONMENT: Lives with: lives with their spouse Lives in: House/apartment Stairs: No Has following equipment at home: Single point cane, Environmental consultant - 2 wheeled, and bed side commode  OCCUPATION: not working   PLOF: Independent with basic ADLs, Independent with household mobility without device, and Independent with community mobility without device  PATIENT GOALS: Avoid surgery  NEXT MD VISIT: unknown  OBJECTIVE:   DIAGNOSTIC FINDINGS:  FINDINGS: Alignment: Unchanged alignment of the cervical spine.   Vertebrae: No fracture. No focal bone lesion. Postsurgical changes from C6-C7 spinal fusion.   Cord: Normal signal and morphology.   Posterior Fossa, vertebral arteries, paraspinal tissues: Negative.   Disc levels:   C1-C2: Mild degenerative change.   C2-C3: Significant disc bulge. Mild bilateral facet degenerative change. No spinal canal narrowing. No neural foraminal narrowing.   C3-C4: No significant disc bulge. Mild bilateral facet degenerative change. No spinal canal narrowing. No neural foraminal narrowing.   C4-C5: Significant disc bulge. Mild bilateral facet degenerative change. Mild uncovertebral hypertrophy. Mild right neural foraminal narrowing. No spinal canal narrowing.   C5-C6: Junctional level. Minimal disc bulge. Moderate left and mild right facet degenerative change. Ligamentum flavum hypertrophy. Mild-to-moderate spinal canal narrowing. Uncovertebral  hypertrophy. Severe left neural foraminal narrowing. Mild-to-moderate right neural foraminal narrowing.   C6-C7: Fused level. No neural foraminal narrowing. Assessment for the degree of spinal canal narrowing is limited due to susceptibility artifact metallic fusion hardware.   C7-T1: Unremarkable   IMPRESSION: 1. Postsurgical changes from C6-C7 spinal fusion. No evidence of hardware complications. 2. Junctional level degenerative change at C5-C6 where there is mild-to-moderate canal narrowing and severe left neural foraminal narrowing. Findings have progressed compared to 11  PATIENT SURVEYS:  FOTO emailed  FOTO 72 % Predicted 73% (consider retake)  COGNITION: Overall cognitive status: Within functional limits for tasks assessed  SENSATION: WFL  POSTURE: rounded shoulders, forward head, and increased thoracic kyphosis  PALPATION:     CERVICAL ROM:   Active ROM A/PROM (deg) eval  Flexion 25  Extension 15  Right lateral flexion 20 pain on R   Left lateral flexion 13 pain on R   Right rotation 40 pain   Left rotation 40 pain    (Blank rows = not tested)  UPPER EXTREMITY ROM: WFL, end range pain overhead   Active ROM Right eval Left eval  Shoulder flexion    Shoulder extension    Shoulder abduction    Shoulder adduction    Shoulder extension    Shoulder internal rotation    Shoulder external rotation    Elbow flexion    Elbow extension    Wrist flexion    Wrist extension    Wrist ulnar deviation    Wrist radial deviation  Wrist pronation    Wrist supination     (Blank rows = not tested)  UPPER EXTREMITY MMT:  MMT Right eval Left eval  Shoulder flexion 3+ 4+  Shoulder extension    Shoulder abduction 3+ 4+  Shoulder adduction    Shoulder extension    Shoulder internal rotation 4+   Shoulder external rotation 4+   Middle trapezius    Lower trapezius    Elbow flexion    Elbow extension    Wrist flexion    Wrist extension    Wrist ulnar  deviation    Wrist radial deviation    Wrist pronation    Wrist supination    Grip strength 23  35   (Blank rows = not tested)  CERVICAL SPECIAL TESTS:  NT  FUNCTIONAL TESTS:  5 times sit to stand: 11.4 sec 12/05/22: 2 Minute walk test     PALPATION: Pain along L glute medius, L SI border Soreness in L low lumbar region   LUMBAR ROM:   Active  A/PROM  12/05/2022  Flexion WFL with pain L post hip   Extension 75% limited with pain   Right lateral flexion WFL  Left lateral flexion Reaches to 3 inches above knee , pain   Right rotation [facility]  Left rotation Pain    (Blank rows = not tested)  LE ROM: NT   Active  Right 12/05/2022 Left 12/05/2022  Hip flexion    Hip extension    Hip abduction    Hip adduction    Hip internal rotation    Hip external rotation    Knee flexion    Knee extension    Ankle dorsiflexion    Ankle plantarflexion    Ankle inversion    Ankle eversion     (Blank rows = not tested)  LE MMT:  MMT Right 12/05/2022 Left 12/05/2022  Hip flexion 5/5 4-/5  Hip extension 5/5 4+/5  Hip abduction    Hip adduction    Hip internal rotation    Hip external rotation    Knee flexion 5/5 5/5  Knee extension 5/5 5/5  Ankle dorsiflexion    Ankle plantarflexion    Ankle inversion    Ankle eversion     (Blank rows = not tested)   LUMBAR SPECIAL TESTS:  Stork standing: Positive  unable to stand on LLE    TODAY'S TREATMENT:                [facility] Adult PT Treatment:                                                DATE: 12/07/22 Therapeutic Exercise: Nustep L5 UE and LE for 7 min  Seated figure 4 added small hinge x 3 Wide knees lower trunk rotation  Supine green band for double leg clam and march x 10  Partial range bridge x 12 Quadratus lumborum lateral trunk stretch  Manual Therapy: Soft tissue work to L lateral trunk, paraspinals, glute med and piriformis Caudal pressure to L iliac crest to elongate L side  Modalities: MHP 10 min in sidelying  [facility] Adult PT Treatment:                                                DATE: 12/05/22 Therapeutic Exercise: Seated chin tuck Seated UT and levator stretch  Supine dowel pullovers- increased right arm weakness so disc.  Supine red ER 10 x 2  Supine horiz abdct 10 x 2 , red Supine cervical rotation  Supine chin tuck  For Sacral pain : seated figure 4, vs 1/2 [place] and standing hip flexor   Therapeutic Activity: FOTO 72% 2 MWT: 307 feet  Assessment of sacral pain  Self Care: Sacral pain - recommended ice for sacral pain, get out the TENS unit, light stretching   DATE: 11/14/22   PATIENT EDUCATION:  Education details: PT/POC, HEP, stabilization, nerve pain  Person educated: Patient Education method: Explanation, Demonstration, and Handouts Education comprehension: verbalized understanding  HOME EXERCISE PROGRAM: Access Code: [email or web address] URL: [email or web address] Date: 12/05/2022 Prepared by: Karie Mainland  Exercises - Supine Cervical Rotation AROM on Pillow  - 1 x daily - 7 x weekly - 2 sets - 10 reps - 10 hold - Seated Scapular Retraction  - 1 x daily - 7 x weekly - 2 sets - 10 reps - 5 hold  - Seated Chin Tuck with Neck Elongation  - 1 x daily - 7 x weekly - 2 sets - 10 reps - 5 hold - Supine Cervical Retraction with Towel  - 1 x daily - 7 x weekly - 2 sets - 10 reps - 5 hold - Seated Upper Trapezius Stretch  - 1 x daily - 7 x weekly - 1 sets - 3 reps - 10-20 hold - 12/05/22 Standing Hip Flexor Stretch  - 1-3 x daily - 7 x weekly - 1 sets - 3 reps - 30 hold - Seated Figure 4 Piriformis Stretch  - 1-3 x daily - 7 x weekly - 1 sets - 3 reps - 30 hold   ASSESSMENT:  CLINICAL IMPRESSION: Patient tolerated exercises well focused on her left lumbosacral region.  Trial of side-lying manual therapy to see if that can reduce the pain and spasming she  feels.  Continue plan of care to include neck and lumbar region.  EVAL: Patient is a 67 y.o. female who was seen today for physical therapy evaluation and treatment for cervical radiculopathy. She also has a history of sacroiliitis and did not complete PT due to fear of the water.  She will be assessed for her sacral pain within the next visits.    OBJECTIVE IMPAIRMENTS: decreased mobility, difficulty walking, decreased ROM, decreased strength, increased fascial restrictions, increased muscle spasms, impaired flexibility, impaired UE functional use, postural dysfunction, obesity, and pain.   ACTIVITY LIMITATIONS: carrying, lifting, bending, sitting, standing, squatting, sleeping, stairs, transfers, bed mobility, reach over head, and locomotion level  PARTICIPATION LIMITATIONS: meal prep, cleaning, laundry, interpersonal relationship, shopping, and community activity  PERSONAL FACTORS: Past/current experiences, Time since onset of injury/illness/exacerbation, and 3+ comorbidities: TKA, previous spine surgery, fibromyalgia   are also affecting patient's functional outcome.   REHAB POTENTIAL: Good  CLINICAL DECISION MAKING: Evolving/moderate complexity  EVALUATION COMPLEXITY: Moderate   GOALS: Goals reviewed with patient? Yes  SHORT TERM GOALS: Target date: 12/05/2022    Pt will be I with HEP for basic HEP for cervical stability, ROM  Baseline:  Goal status: INITIAL  2.  Pt will be able to report decreased pain into Rt UE with exercises (< 3/10 radiating) Baseline:  Goal status: INITIAL  3.  Pt will complete functional testing and set goal Baseline:  Goal status: MET    LONG TERM GOALS: Target date: 01/09/2023    Pt will be I with final HEP for low back, neck pain and function.  Baseline:  Goal status: INITIAL  2.  Pt will improve Rt UE strength to 4+/5 for optimal UE function.  Baseline:  Goal status: INITIAL  3.  UE symptoms will centralize and occur < 2-3 x per week   Baseline:  Goal status: INITIAL  4.  Cervical AROM will improve by 15-20 deg in each direction for more natural interaction with family, environment  Baseline:  Goal status: INITIAL  5.  FOTO score will improve to 73% upon discharge  Baseline: 72% (pt answered no diff bc she does not do)  Goal status: INITIAL  6. Patient will be able to sit and stand symmetrically without increased pain in LLE  Baseline:  Goal status: INITIAL  7. Pt will complete 2 min walk test with no increased LLE pain, 350 feet or more   Baseline: 307 feet   Goal status: NEW  PLAN:  PT FREQUENCY: 2x/week  PT DURATION: 8 weeks  PLANNED INTERVENTIONS: 97164- PT Re-evaluation, 97110-Therapeutic exercises, 97530- Therapeutic activity, 97112- Neuromuscular re-education, 97535- Self Care, 40981- Manual therapy, 97116- Gait training, 97014- Electrical stimulation (unattended), Patient/Family education, Balance training, Taping, Cryotherapy, and Moist heat  PLAN FOR NEXT SESSION: no dry needling.     LE strength ,    Jannette Spanner, PTA 12/07/22 12:03 PM Phone: [phone] Fax: [phone]     Karie Mainland, PT 12/07/22 12:03 PM Phone: [phone] Fax: [phone]

## 2022-12-12 ENCOUNTER — Ambulatory Visit: Payer: Medicare PPO | Admitting: Physical Therapy

## 2022-12-12 DIAGNOSIS — M461 Sacroiliitis, not elsewhere classified: Secondary | ICD-10-CM | POA: Diagnosis not present

## 2022-12-12 DIAGNOSIS — Z6841 Body Mass Index (BMI) 40.0 and over, adult: Secondary | ICD-10-CM | POA: Diagnosis not present

## 2022-12-13 DIAGNOSIS — E66813 Obesity, class 3: Secondary | ICD-10-CM | POA: Diagnosis not present

## 2022-12-13 DIAGNOSIS — E119 Type 2 diabetes mellitus without complications: Secondary | ICD-10-CM | POA: Diagnosis not present

## 2022-12-13 DIAGNOSIS — Z6841 Body Mass Index (BMI) 40.0 and over, adult: Secondary | ICD-10-CM | POA: Diagnosis not present

## 2022-12-13 DIAGNOSIS — Z79899 Other long term (current) drug therapy: Secondary | ICD-10-CM | POA: Diagnosis not present

## 2022-12-14 ENCOUNTER — Ambulatory Visit: Payer: Medicare PPO | Admitting: Physical Therapy

## 2022-12-19 ENCOUNTER — Ambulatory Visit: Payer: Medicare PPO | Admitting: Physical Therapy

## 2022-12-21 ENCOUNTER — Encounter: Payer: Medicare PPO | Admitting: Physical Therapy

## 2023-01-05 ENCOUNTER — Other Ambulatory Visit: Payer: Self-pay | Admitting: Nurse Practitioner

## 2023-01-20 DIAGNOSIS — M4722 Other spondylosis with radiculopathy, cervical region: Secondary | ICD-10-CM | POA: Diagnosis not present

## 2023-01-20 DIAGNOSIS — M461 Sacroiliitis, not elsewhere classified: Secondary | ICD-10-CM | POA: Diagnosis not present

## 2023-01-23 ENCOUNTER — Other Ambulatory Visit: Payer: Self-pay | Admitting: Neurological Surgery

## 2023-01-24 ENCOUNTER — Telehealth: Payer: Self-pay | Admitting: *Deleted

## 2023-01-24 NOTE — Telephone Encounter (Signed)
   Pre-operative Risk Assessment    Patient Name: [name]  DOB: [birth year] MRN: [id number]   Date of last office visit: 10/14/22 Robin Searing, NP Date of next office visit: NONE   Request for Surgical Clearance    Procedure:  S1 FUSION  Date of Surgery:  Clearance 02/02/23                                Surgeon:  DR. Monia Pouch Surgeon's Group or Practice Name:  [facility] Phone number:  [phone] Fax number:  [phone] ATTN: Lowella Bandy   Type of Clearance Requested:   - Medical  - Pharmacy:  Hold Aspirin     Type of Anesthesia:  General    Additional requests/questions:    [name]   01/24/2023, 4:07 PM

## 2023-01-25 ENCOUNTER — Telehealth: Payer: Self-pay | Admitting: *Deleted

## 2023-01-25 NOTE — Telephone Encounter (Signed)
Pt has been scheduled tele pre op appt add on tomorrow ok per preop APP Robin Searing, NP. Tele 01/26/23. Med rec and consent are done.     Patient Consent for Virtual Visit        [name] has provided verbal consent on 01/25/2023 for a virtual visit (video or telephone).   CONSENT FOR VIRTUAL VISIT FOR:  [name]  By participating in this virtual visit I agree to the following:  I hereby voluntarily request, consent and authorize [facility] and its employed or contracted physicians, physician assistants, nurse practitioners or other licensed health care professionals (the Practitioner), to provide me with telemedicine health care services (the "Services") as deemed necessary by the treating Practitioner. I acknowledge and consent to receive the Services by the Practitioner via telemedicine. I understand that the telemedicine visit will involve communicating with the Practitioner through live audiovisual communication technology and the disclosure of certain medical information by electronic transmission. I acknowledge that I have been given the opportunity to request an in-person assessment or other available alternative prior to the telemedicine visit and am voluntarily participating in the telemedicine visit.  I understand that I have the right to withhold or withdraw my consent to the use of telemedicine in the course of my care at any time, without affecting my right to future care or treatment, and that the Practitioner or I may terminate the telemedicine visit at any time. I understand that I have the right to inspect all information obtained and/or recorded in the course of the telemedicine visit and may receive copies of available information for a reasonable fee.  I understand that some of the potential risks of receiving the Services via telemedicine include:  Delay or interruption in medical evaluation due to technological equipment failure or  disruption; Information transmitted may not be sufficient (e.g. poor resolution of images) to allow for appropriate medical decision making by the Practitioner; and/or  In rare instances, security protocols could fail, causing a breach of personal health information.  Furthermore, I acknowledge that it is my responsibility to provide information about my medical history, conditions and care that is complete and accurate to the best of my ability. I acknowledge that Practitioner's advice, recommendations, and/or decision may be based on factors not within their control, such as incomplete or inaccurate data provided by me or distortions of diagnostic images or specimens that may result from electronic transmissions. I understand that the practice of medicine is not an exact science and that Practitioner makes no warranties or guarantees regarding treatment outcomes. I acknowledge that a copy of this consent can be made available to me via my patient portal [facility] MyChart), or I can request a printed copy by calling the office of [facility].    I understand that my insurance will be billed for this visit.   I have read or had this consent read to me. I understand the contents of this consent, which adequately explains the benefits and risks of the Services being provided via telemedicine.  I have been provided ample opportunity to ask questions regarding this consent and the Services and have had my questions answered to my satisfaction. I give my informed consent for the services to be provided through the use of telemedicine in my medical care

## 2023-01-25 NOTE — Telephone Encounter (Signed)
Patient is returning call and is requesting call back.  

## 2023-01-25 NOTE — Telephone Encounter (Signed)
Per preop APP Robin Searing, NP ok to add pt on tomorrow due to procedure date and med hold. Left message for the pt to call back ASAP to scheduled tele.

## 2023-01-25 NOTE — Telephone Encounter (Signed)
Pt has been scheduled tele pre op appt add on tomorrow ok per preop APP Robin Searing, NP. Tele 01/26/23. Med rec and consent are done.

## 2023-01-25 NOTE — Telephone Encounter (Signed)
   Name: [name]  DOB: 07-04-1955  MRN: [id number]  Primary Cardiologist: Lesleigh Noe, MD (Inactive)   Preoperative team, please contact this patient and set up a phone call appointment for further preoperative risk assessment. Please obtain consent and complete medication review. Thank you for your help.  I confirm that guidance regarding antiplatelet and oral anticoagulation therapy has been completed and, if necessary, noted below.  Per protocol patient can hold ASA 81 mg 5 to 7 days prior to procedure.  I also confirmed the patient resides in the state of [place]. As per [facility] Medical Board telemedicine laws, the patient must reside in the state in which the provider is licensed.   Napoleon Form, Leodis Rains, NP 01/25/2023, 8:22 AM [facility]

## 2023-01-26 ENCOUNTER — Ambulatory Visit: Payer: Medicare PPO | Attending: Nurse Practitioner

## 2023-01-26 DIAGNOSIS — Z01810 Encounter for preprocedural cardiovascular examination: Secondary | ICD-10-CM

## 2023-01-26 NOTE — Progress Notes (Signed)
Virtual Visit via Telephone Note   Because of Lanicia Goedken Gilland's co-morbid illnesses, she is at least at moderate risk for complications without adequate follow up.  This format is felt to be most appropriate for this patient at this time.  The patient did not have access to video technology/had technical difficulties with video requiring transitioning to audio format only (telephone).  All issues noted in this document were discussed and addressed.  No physical exam could be performed with this format.  Please refer to the patient's chart for her consent to telehealth for [facility].  Evaluation Performed:  Preoperative cardiovascular risk assessment _____________   Date:  01/26/2023   Patient ID:  Shanquetta Ormond, DOB 10-29-1955, MRN [id number] Patient Location:  Home Provider location:   Office  Primary Care Provider:  Noberto Retort, MD Primary Cardiologist:  Lesleigh Noe, MD (Inactive)  Chief Complaint / Patient Profile   68 y.o. y/o female with a h/o HLD, DM type II,  RBBB, obesity, nonobstructive coronary disease  who is pending S1 fusion procedure and presents today for telephonic preoperative cardiovascular risk assessment.  History of Present Illness    Sahori Nayar is a 68 y.o. female who presents via audio/video conferencing for a telehealth visit today. Pt was last seen in cardiology clinic on 10/14/2022 by Robin Searing, NP. At that time Jahnaya Cohill was doing well with no new cardiac complaints.  She did endorse some shortness of breath with exertion and 2D echo was completed to evaluate heart structure and valve function that showed no acute changes or valve abnormalities.  The patient is now pending procedure as outlined above. Since her last visit, she has been doing well with no new cardiac complaints.  She has lost a total of 15 pounds with Ozempic.  She is hoping to gain some mobility with her procedure to lose more  weight.  She denies chest pain, shortness of breath, lower extremity edema, fatigue, palpitations, melena, hematuria, hemoptysis, diaphoresis, weakness, presyncope, syncope, orthopnea, and PND.    Past Medical History    Past Medical History:  Diagnosis Date   Anginal pain (HCC)    "on and off" (11/01/2011) - anxiety related   Anxiety    takes Paxil daily and Xanax prn   Arthritis    "knees" (10/16/2013)   Chronic back pain    facet disease and bulging disc; "thoracic and lower back" (10/16/2013)   Constipation    r/t pain meds;takes stool softener daily   COVID 01/2022   Dysrhythmia    rapid HR on occasion-takes Propranolol daily   Family history of anesthesia complication    ":PONV; mom and sisters"   History of bladder infections    > 19yr ago   History of kidney stones    Hyperlipidemia    takes LIpitor daily   Hypothyroidism    takes Synthroid daily   Insomnia    Joint pain    Joint swelling    Muscle spasms of head or neck    lumbar and thoracic;takes Robaxin prn   Peripheral neuropathy    PONV (postoperative nausea and vomiting)    Rosacea    Seasonal allergies    takes allegra prn   Sleep apnea    Past Surgical History:  Procedure Laterality Date   ABDOMINAL HYSTERECTOMY  1980's   ANAL FISSURE REPAIR  ~ 2011   "banded hemorrhoids at this time too"   BACK SURGERY  10/20/2018  lumbar fusion L4-5, L5-S1   BARIATRIC SURGERY     CARDIAC CATHETERIZATION  ~ 1999   CARPAL TUNNEL RELEASE Left 09/13/2017   & ulnar nerve release   CERVICAL DISC SURGERY  03/2017   C5, C6    COLONOSCOPY     ESOPHAGOGASTRODUODENOSCOPY     KNEE ARTHROPLASTY Left 2007   KNEE ARTHROSCOPY Right    KNEE ARTHROSCOPY Right    "torn meniscus"   KNEE SURGERY Left 1982-2013   "17 before 10/31/2013"   LEFT OOPHORECTOMY  1980's   PATELLECTOMY Right    PLANTAR FASCIA SURGERY Bilateral 1990's   REVISION TOTAL KNEE ARTHROPLASTY Left 11/01/2011   SACROILIAC JOINT FUSION Right 02/01/2022    Procedure: Minimally Invasive Surgery Sacroiliac Fusion ,Right;  Surgeon: Dawley, Alan Mulder, DO;  Location: [facility] OR;  Service: Neurosurgery;  Laterality: Right;   TOTAL KNEE ARTHROPLASTY Right 10/15/2013   Procedure: RIGHT TOTAL KNEE ARTHROPLASTY;  Surgeon: Valeria Batman, MD;  Location: [facility] OR;  Service: Orthopedics;  Laterality: Right;   TOTAL KNEE REVISION  11/01/2011   Procedure: TOTAL KNEE REVISION;  Surgeon: Valeria Batman, MD;  Location: [facility] OR;  Service: Orthopedics;  Laterality: Left;  Revision Left Total Knee Replacement    TOTAL KNEE REVISION Right 09/25/2020   Procedure: RIGHT POLY LINER EXCHANGE;  Surgeon: Kathryne Hitch, MD;  Location: [facility] ORS;  Service: Orthopedics;  Laterality: Right;   TUBAL LIGATION  1981    Allergies  Allergies  Allergen Reactions   Morphine And Codeine Other (See Comments)    Feels like bugs crawling over her   Nsaids Hives   Tape Other (See Comments)    Blisters with tape after surgical procedures   Tapentadol Other (See Comments)    Blisters with tape after surgical procedures   Buspar [Buspirone] Palpitations   Ciprofloxacin Nausea Only    Upset stomach    Codeine Other (See Comments)    Hyper   Gabapentin Other (See Comments)    Suicidal ideation.   Levothyroxine     Other reaction(s): hot flashes,    Lodine [Etodolac] Nausea Only    Upset Stomach    Lyrica [Pregabalin] Other (See Comments)    suicidal ideation   Morphine Sulfate Hives   Naproxen Sodium    Niacin And Related Other (See Comments)    flushing   Nitrofurantoin Hives   Pravastatin Other (See Comments)    achiness    Septra [Sulfamethoxazole-Trimethoprim] Other (See Comments)    Stomach pains   Tramadol     Other reaction(s): Pt reports drug interaction with Paxil   Tylox [Oxycodone-Acetaminophen] Other (See Comments)    Hyper   Tyloxapol     Other reaction(s): hyper   Wellbutrin [Bupropion] Other (See Comments)    dizzy    Home Medications     Prior to Admission medications   Medication Sig Start Date End Date Taking? Authorizing Provider  acetaminophen (TYLENOL) 500 MG tablet Take 500 mg by mouth every 6 (six) hours as needed for moderate pain.    [provider]  Acetylcysteine (NAC PO) Take 2 capsules by mouth daily.    [provider]  ALPRAZolam Prudy Feeler) 0.25 MG tablet Take 1 tablet (0.25 mg total) by mouth 4 (four) times daily as needed for anxiety. 08/08/22   Cottle, Steva Ready., MD  amoxicillin (AMOXIL) 500 MG capsule Take 2,000 mg by mouth See admin instructions. Take 2000 mg by mouth 1 hour prior to dental procedure.    [provider]  aspirin EC 81 MG tablet Take 1 tablet (81 mg total) by mouth daily. Swallow whole. 02/09/22   Dawley, Troy C, DO  Azelaic Acid 15 % gel Apply 1 Application topically 2 (two) times daily as needed (rosacea). 10/14/22   [provider]  B Complex Vitamins (B COMPLEX PO) Place 1 tablet under the tongue daily.    [provider]  Cholecalciferol (VITAMIN D-3) 5000 UNITS TABS Take 5,000 Units by mouth daily.    [provider]  clindamycin (CLEOCIN T) 1 % external solution Apply 1 Application topically daily as needed (rosacea).    [provider]  ezetimibe (ZETIA) 10 MG tablet Take 10 mg by mouth daily.  01/23/17   [provider]  fexofenadine (ALLEGRA) 180 MG tablet Take 180 mg by mouth daily as needed for allergies.     [provider]  fluticasone (FLONASE) 50 MCG/ACT nasal spray Place 1-2 sprays into both nostrils daily as needed for allergies or rhinitis.    [provider]  HYDROcodone-acetaminophen (NORCO/VICODIN) 5-325 MG tablet Take 1-2 tablets by mouth every 4 (four) hours as needed for moderate pain. 02/02/22   Dawley, Alan Mulder, DO  levothyroxine (SYNTHROID) 137 MCG tablet Take 137 mcg by mouth daily before breakfast.    [provider]  lidocaine 4 % Place 1 patch onto the skin daily as needed  (pain).    [provider]  metFORMIN (GLUCOPHAGE) 500 MG tablet Take 500 mg by mouth 2 (two) times daily with a meal. 08/11/22 02/07/23  [provider]  methocarbamol (ROBAXIN) 500 MG tablet Take 500 mg by mouth every 12 (twelve) hours as needed for muscle spasms. 01/20/23   [provider]  metroNIDAZOLE (METROGEL) 0.75 % gel Apply 1 Application topically 2 (two) times daily as needed (rosacea).    [provider]  minocycline (MINOCIN) 100 MG capsule Take 100 mg by mouth 2 (two) times daily as needed (rosacea flare). 10/11/22   [provider]  Multiple Vitamin (MULTIVITAMIN WITH MINERALS) TABS tablet Take 1 tablet by mouth daily.    [provider]  OZEMPIC, 0.25 OR 0.5 MG/DOSE, 2 MG/3ML SOPN Inject 0.25 mg into the skin every 7 (seven) days. 12/13/22 03/07/23  [provider]  PARoxetine (PAXIL) 20 MG tablet TAKE 1 TABLET BY MOUTH IN THE MORNING ATNOON AND AT BEDTIME Patient taking differently: Take 20 mg by mouth in the morning and at bedtime. TAKE 1 TABLET BY MOUTH IN THE MORNING ATNOON AND AT BEDTIME 08/08/22   Cottle, Steva Ready., MD  pramipexole (MIRAPEX) 0.25 MG tablet TAKE ONE TABLET BY MOUTH IN THE MORNING AND ONE AND ONE HALF (1/2) TABLET BY MOUTH AT NIGHT. 08/08/22   Cottle, Steva Ready., MD  propranolol ER (INDERAL LA) 80 MG 24 hr capsule Take 80 mg by mouth daily. 12/10/21   [provider]  rosuvastatin (CRESTOR) 5 MG tablet TAKE ONE TABLET BY MOUTH ONCE A DAY 01/06/23   Gaston Islam., NP    Physical Exam    Vital Signs:  Eldred Luff does not have vital signs available for review today.  Given telephonic nature of communication, physical exam is limited. AAOx3. NAD. Normal affect.  Speech and respirations are unlabored.  Accessory Clinical Findings    None  Assessment & Plan    1.  Preoperative Cardiovascular Risk Assessment: -Patient's RCRI score is 0.9% The patient affirms she has been doing well  without any new cardiac  symptoms. They are able to achieve 5 METS without cardiac limitations. Therefore, based on ACC/AHA guidelines, the patient would be at acceptable risk for the planned procedure without further cardiovascular testing. The patient was advised that if she develops new symptoms prior to surgery to contact our office to arrange for a follow-up visit, and she verbalized understanding.   The patient was advised that if she develops new symptoms prior to surgery to contact our office to arrange for a follow-up visit, and she verbalized understanding.  Per protocol patient can hold ASA 81 mg 5 to 7 days prior to procedure.   A copy of this note will be routed to requesting surgeon.  Time:   Today, I have spent 8 minutes with the patient with telehealth technology discussing medical history, symptoms, and management plan.     Napoleon Form, Leodis Rains, NP  01/26/2023, 7:41 AM

## 2023-01-26 NOTE — Pre-Procedure Instructions (Signed)
Surgical Instructions   Your procedure is scheduled on February 02, 2023. Report to [facility] Main Entrance "A" at 5:30 A.M., then check in with the Admitting office. Any questions or running late day of surgery: call [phone]  Questions prior to your surgery date: call [phone], Monday-Friday, 8am-4pm. If you experience any cold or flu symptoms such as cough, fever, chills, shortness of breath, etc. between now and your scheduled surgery, please notify us at the above number.     Remember:  Do not eat or drink after midnight the night before your surgery    Take these medicines the morning of surgery with A SIP OF WATER: ezetimibe (ZETIA)  levothyroxine (SYNTHROID)  PARoxetine (PAXIL)  pramipexole (MIRAPEX)  propranolol ER (INDERAL LA)  rosuvastatin (CRESTOR)    May take these medicines IF NEEDED: acetaminophen (TYLENOL)  ALPRAZolam (XANAX)  fexofenadine (ALLEGRA)  fluticasone (FLONASE) nasal spray  HYDROcodone-acetaminophen (NORCO/VICODIN)  methocarbamol (ROBAXIN)  minocycline (MINOCIN)    Follow your surgeon's instructions on when to stop Aspirin.  If no instructions were given by your surgeon then you will need to call the office to get those instructions.     One week prior to surgery, STOP taking any Aleve, Naproxen, Ibuprofen, Motrin, Advil, Goody's, BC's, all herbal medications, fish oil, and non-prescription vitamins.   WHAT DO I DO ABOUT MY DIABETES MEDICATION?   Do not take metFORMIN (GLUCOPHAGE) the morning of surgery.  STOP taking your OZEMPIC one week prior to surgery. DO NOT take any doses after January 22nd.   HOW TO MANAGE YOUR DIABETES BEFORE AND AFTER SURGERY  Why is it important to control my blood sugar before and after surgery? Improving blood sugar levels before and after surgery helps healing and can limit problems. A way of improving blood sugar control is eating a healthy diet by:  Eating less sugar and carbohydrates  Increasing  activity/exercise  Talking with your doctor about reaching your blood sugar goals High blood sugars (greater than 180 mg/dL) can raise your risk of infections and slow your recovery, so you will need to focus on controlling your diabetes during the weeks before surgery. Make sure that the doctor who takes care of your diabetes knows about your planned surgery including the date and location.  How do I manage my blood sugar before surgery? Check your blood sugar at least 4 times a day, starting 2 days before surgery, to make sure that the level is not too high or low.  Check your blood sugar the morning of your surgery when you wake up and every 2 hours until you get to the Short Stay unit.  If your blood sugar is less than 70 mg/dL, you will need to treat for low blood sugar: Do not take insulin. Treat a low blood sugar (less than 70 mg/dL) with  cup of clear juice (cranberry or apple), 4 glucose tablets, OR glucose gel. Recheck blood sugar in 15 minutes after treatment (to make sure it is greater than 70 mg/dL). If your blood sugar is not greater than 70 mg/dL on recheck, call [phone] for further instructions. Report your blood sugar to the short stay nurse when you get to Short Stay.  If you are admitted to the hospital after surgery: Your blood sugar will be checked by the staff and you will probably be given insulin after surgery (instead of oral diabetes medicines) to make sure you have good blood sugar levels. The goal for blood sugar control after surgery is 80-180 mg/dL.  Do NOT Smoke (Tobacco/Vaping) for 24 hours prior to your procedure.  If you use a CPAP at night, you may bring your mask/headgear for your overnight stay.   You will be asked to remove any contacts, glasses, piercing's, hearing aid's, dentures/partials prior to surgery. Please bring cases for these items if needed.    Patients discharged the day of surgery will not be allowed to drive  home, and someone needs to stay with them for 24 hours.  SURGICAL WAITING ROOM VISITATION Patients may have no more than 2 support people in the waiting area - these visitors may rotate.   Pre-op nurse will coordinate an appropriate time for 1 ADULT support person, who may not rotate, to accompany patient in pre-op.  Children under the age of 60 must have an adult with them who is not the patient and must remain in the main waiting area with an adult.  If the patient needs to stay at the hospital during part of their recovery, the visitor guidelines for inpatient rooms apply.  Please refer to the [facility] website for the visitor guidelines for any additional information.   If you received a COVID test during your pre-op visit  it is requested that you wear a mask when out in public, stay away from anyone that may not be feeling well and notify your surgeon if you develop symptoms. If you have been in contact with anyone that has tested positive in the last 10 days please notify you surgeon.      Pre-operative 5 CHG Bathing Instructions   You can play a key role in reducing the risk of infection after surgery. Your skin needs to be as free of germs as possible. You can reduce the number of germs on your skin by washing with CHG (chlorhexidine gluconate) soap before surgery. CHG is an antiseptic soap that kills germs and continues to kill germs even after washing.   DO NOT use if you have an allergy to chlorhexidine/CHG or antibacterial soaps. If your skin becomes reddened or irritated, stop using the CHG and notify one of our RNs at [phone].   Please shower with the CHG soap starting 4 days before surgery using the following schedule:     Please keep in mind the following:  DO NOT shave, including legs and underarms, starting the day of your first shower.   You may shave your face at any point before/day of surgery.  Place clean sheets on your bed the day you start using CHG  soap. Use a clean washcloth (not used since being washed) for each shower. DO NOT sleep with pets once you start using the CHG.   CHG Shower Instructions:  Wash your face and private area with normal soap. If you choose to wash your hair, wash first with your normal shampoo.  After you use shampoo/soap, rinse your hair and body thoroughly to remove shampoo/soap residue.  Turn the water OFF and apply about 3 tablespoons (45 ml) of CHG soap to a CLEAN washcloth.  Apply CHG soap ONLY FROM YOUR NECK DOWN TO YOUR TOES (washing for 3-5 minutes)  DO NOT use CHG soap on face, private areas, open wounds, or sores.  Pay special attention to the area where your surgery is being performed.  If you are having back surgery, having someone wash your back for you may be helpful. Wait 2 minutes after CHG soap is applied, then you may rinse off the CHG soap.  Pat dry with a  clean towel  Put on clean clothes/pajamas   If you choose to wear lotion, please use ONLY the CHG-compatible lotions that are listed below.  Additional instructions for the day of surgery: DO NOT APPLY any lotions, deodorants, cologne, or perfumes.   Do not bring valuables to the hospital. [facility] is not responsible for any belongings/valuables. Do not wear nail polish, gel polish, artificial nails, or any other type of covering on natural nails (fingers and toes) Do not wear jewelry or makeup Put on clean/comfortable clothes.  Please brush your teeth.  Ask your nurse before applying any prescription medications to the skin.     CHG Compatible Lotions   Aveeno Moisturizing lotion  Cetaphil Moisturizing Cream  Cetaphil Moisturizing Lotion  Clairol Herbal Essence Moisturizing Lotion, Dry Skin  Clairol Herbal Essence Moisturizing Lotion, Extra Dry Skin  Clairol Herbal Essence Moisturizing Lotion, Normal Skin  Curel Age Defying Therapeutic Moisturizing Lotion with Alpha Hydroxy  Curel Extreme Care Body Lotion  Curel Soothing  Hands Moisturizing Hand Lotion  Curel Therapeutic Moisturizing Cream, Fragrance-Free  Curel Therapeutic Moisturizing Lotion, Fragrance-Free  Curel Therapeutic Moisturizing Lotion, Original Formula  Eucerin Daily Replenishing Lotion  Eucerin Dry Skin Therapy Plus Alpha Hydroxy Crme  Eucerin Dry Skin Therapy Plus Alpha Hydroxy Lotion  Eucerin Original Crme  Eucerin Original Lotion  Eucerin Plus Crme Eucerin Plus Lotion  Eucerin TriLipid Replenishing Lotion  Keri Anti-Bacterial Hand Lotion  Keri Deep Conditioning Original Lotion Dry Skin Formula Softly Scented  Keri Deep Conditioning Original Lotion, Fragrance Free Sensitive Skin Formula  Keri Lotion Fast Absorbing Fragrance Free Sensitive Skin Formula  Keri Lotion Fast Absorbing Softly Scented Dry Skin Formula  Keri Original Lotion  Keri Skin Renewal Lotion Keri Silky Smooth Lotion  Keri Silky Smooth Sensitive Skin Lotion  Nivea Body Creamy Conditioning Oil  Nivea Body Extra Enriched Lotion  Nivea Body Original Lotion  Nivea Body Sheer Moisturizing Lotion Nivea Crme  Nivea Skin Firming Lotion  NutraDerm 30 Skin Lotion  NutraDerm Skin Lotion  NutraDerm Therapeutic Skin Cream  NutraDerm Therapeutic Skin Lotion  ProShield Protective Hand Cream  Provon moisturizing lotion  Please read over the following fact sheets that you were given.

## 2023-01-27 ENCOUNTER — Encounter (HOSPITAL_COMMUNITY): Payer: Self-pay

## 2023-01-27 ENCOUNTER — Other Ambulatory Visit: Payer: Self-pay

## 2023-01-27 ENCOUNTER — Encounter (HOSPITAL_COMMUNITY)
Admission: RE | Admit: 2023-01-27 | Discharge: 2023-01-27 | Disposition: A | Discharge status: Home / Self Care | Payer: Medicare PPO | Source: Ambulatory Visit | Attending: Neurological Surgery | Admitting: Neurological Surgery

## 2023-01-27 VITALS — BP 116/58 | HR 76 | Temp 98.3°F | Resp 18 | Ht 65.0 in | Wt 256.0 lb

## 2023-01-27 DIAGNOSIS — Z79899 Other long term (current) drug therapy: Secondary | ICD-10-CM | POA: Insufficient documentation

## 2023-01-27 DIAGNOSIS — Z01812 Encounter for preprocedural laboratory examination: Secondary | ICD-10-CM | POA: Insufficient documentation

## 2023-01-27 DIAGNOSIS — Z01818 Encounter for other preprocedural examination: Secondary | ICD-10-CM

## 2023-01-27 LAB — TYPE AND SCREEN
ABO/RH(D): A POS
Antibody Screen: NEGATIVE

## 2023-01-27 LAB — CBC
HCT: 38 % (ref 36.0–46.0)
Hemoglobin: 12.5 g/dL (ref 12.0–15.0)
MCH: 32.2 pg (ref 26.0–34.0)
MCHC: 32.9 g/dL (ref 30.0–36.0)
MCV: 97.9 fL (ref 80.0–100.0)
Platelets: 327 10*3/uL (ref 150–400)
RBC: 3.88 MIL/uL (ref 3.87–5.11)
RDW: 13.6 % (ref 11.5–15.5)
WBC: 7.3 10*3/uL (ref 4.0–10.5)
nRBC: 0 % (ref 0.0–0.2)

## 2023-01-27 LAB — BASIC METABOLIC PANEL
Anion gap: 10 (ref 5–15)
BUN: 24 mg/dL — ABNORMAL HIGH (ref 8–23)
CO2: 26 mmol/L (ref 22–32)
Calcium: 9.5 mg/dL (ref 8.9–10.3)
Chloride: 105 mmol/L (ref 98–111)
Creatinine, Ser: 0.81 mg/dL (ref 0.44–1.00)
GFR, Estimated: >60 mL/min (ref >60)
Glucose, Bld: 104 mg/dL — ABNORMAL HIGH (ref 70–99)
Potassium: 4 mmol/L (ref 3.5–5.1)
Sodium: 141 mmol/L (ref 135–145)

## 2023-01-27 LAB — SURGICAL PCR SCREEN
MRSA, PCR: NEGATIVE
Staphylococcus aureus: NEGATIVE

## 2023-01-27 NOTE — Progress Notes (Signed)
PCP - Leonides Sake at [place] Cardiologist - Robin Searing - cardiac clearance 01/26/23  PPM/ICD - denies Device Orders - n/a Rep Notified - n/a  Chest x-ray - denies EKG - 10/14/22 Stress Test - 10/15/21 ECHO - 11/11/22 Cardiac Cath - 1999  Sleep Study - OSA+ but since bariatric surgery has not needed CPAP and states she no longer has sleep apnea CPAP - n/a  Patient states that she takes Metformin and Ozempic for weight loss and is not diabetic.  Patient states that she does not check blood sugars at home.  A1C on 10/19/22 was 5.6.    Last dose of GLP1 agonist-  Ozempic last dose was 01/12/23 - patient aware not to take prior to surgery   Blood Thinner Instructions: n/a Aspirin Instructions: last does was 1/9.     ERAS Protcol - NPO   COVID TEST- n/a   Anesthesia review: yes - cardiac clearance  Patient denies shortness of breath, fever, cough and chest pain at PAT appointment   All instructions explained to the patient, with a verbal understanding of the material. Patient agrees to go over the instructions while at home for a better understanding. Patient also instructed to self quarantine after being tested for COVID-19. The opportunity to ask questions was provided.

## 2023-01-30 NOTE — Progress Notes (Signed)
Anesthesia Chart Review:  68 year old female follows with cardiology for history of HLD, right bundle branch block, nonobstructive CAD.  Seen by Robin Searing, NP on 01/26/2023 for preop eval.  Per note, "Patient's RCRI score is 0.9% The patient affirms she has been doing well without any new cardiac symptoms. They are able to achieve 5 METS without cardiac limitations. Therefore, based on ACC/AHA guidelines, the patient would be at acceptable risk for the planned procedure without further cardiovascular testing. The patient was advised that if she develops new symptoms prior to surgery to contact our office to arrange for a follow-up visit, and she verbalized understanding. The patient was advised that if she develops new symptoms prior to surgery to contact our office to arrange for a follow-up visit, and she verbalized understanding. Per protocol patient can hold ASA 81 mg 5 to 7 days prior to procedure."   Other pertinent history includes PONV, hypothyroid, OSA (no longer on CPAP since sleeve gastrectomy with paraesophageal hernia repair 2017), s/p C6-C7 disc arthroplasty.  Reports last dose of Ozempic 01/12/2023.  EKG 10/14/2022: NSR.  Rate 63.  LAD.  Right bundle branch block.  TTE 11/11/2022:  1. Left ventricular ejection fraction, by estimation, is 60 to 65%. The  left ventricle has normal function. The left ventricle has no regional  wall motion abnormalities. Left ventricular diastolic parameters are  consistent with Grade I diastolic  dysfunction (impaired relaxation). Elevated left ventricular end-diastolic  pressure. The average left ventricular global longitudinal strain is -25.7  %. The global longitudinal strain is normal.   2. Right ventricular systolic function is normal. The right ventricular  size is normal.   3. Left atrial size was moderately dilated.   4. The mitral valve is abnormal. Mild mitral valve regurgitation. No  evidence of mitral stenosis. Moderate mitral annular  calcification.   5. The aortic valve is tricuspid. Aortic valve regurgitation is not  visualized. No aortic stenosis is present.   6. The inferior vena cava is normal in size with greater than 50%  respiratory variability, suggesting right atrial pressure of 3 mmHg.   Nuclear stress 10/15/2021:   The study is normal. The study is low risk.   No ST deviation was noted.   LV perfusion is normal. There is no evidence of ischemia. There is no evidence of infarction.   Left ventricular function is normal. Nuclear stress EF: 66 %. The left ventricular ejection fraction is hyperdynamic (>65%). End diastolic cavity size is normal. End systolic cavity size is normal.   Prior study available for comparison from 09/20/2014.   Fixed inferior perfusion defect with normal wall motion, consistent with artifact Low risk study    Zannie Cove [facility] Short Stay Center/Anesthesiology Phone [phone] 01/30/2023 3:06 PM

## 2023-01-30 NOTE — Anesthesia Preprocedure Evaluation (Signed)
Anesthesia Evaluation  Patient identified by MRN, date of birth, ID band Patient awake    Reviewed: Allergy & Precautions, NPO status , Patient's Chart, lab work & pertinent test results  History of Anesthesia Complications (+) PONV and history of anesthetic complications  Airway Mallampati: III  TM Distance: >3 FB Neck ROM: Full    Dental  (+) Teeth Intact, Dental Advisory Given   Pulmonary sleep apnea    Pulmonary exam normal breath sounds clear to auscultation       Cardiovascular + CAD  Normal cardiovascular exam+ dysrhythmias (RBBB)  Rhythm:Regular Rate:Normal     Neuro/Psych  PSYCHIATRIC DISORDERS Anxiety Depression    BILATERAL SACROILIITIS  Neuromuscular disease    GI/Hepatic Neg liver ROS, hiatal hernia,,,  Endo/Other  Hypothyroidism  Class 3 obesity  Renal/GU negative Renal ROS     Musculoskeletal  (+) Arthritis ,    Abdominal   Peds  Hematology negative hematology ROS (+)   Anesthesia Other Findings Day of surgery medications reviewed with the patient.  Reproductive/Obstetrics                             Anesthesia Physical Anesthesia Plan  ASA: 3  Anesthesia Plan: General   Post-op Pain Management: Tylenol PO (pre-op)*   Induction: Intravenous  PONV Risk Score and Plan: 4 or greater and Midazolam, Dexamethasone, Ondansetron and Propofol infusion  Airway Management Planned: Oral ETT  Additional Equipment:   Intra-op Plan:   Post-operative Plan: Extubation in OR  Informed Consent: I have reviewed the patients History and Physical, chart, labs and discussed the procedure including the risks, benefits and alternatives for the proposed anesthesia with the patient or authorized representative who has indicated his/her understanding and acceptance.     Dental advisory given  Plan Discussed with: CRNA  Anesthesia Plan Comments: (PAT note by Antionette Poles,  PA-C:  68 year old female follows with cardiology for history of HLD, right bundle branch block, nonobstructive CAD.  Seen by Robin Searing, NP on 01/26/2023 for preop eval.  Per note, "Patient's RCRI score is 0.9% The patient affirms she has been doing well without any new cardiac symptoms. They are able to achieve 5 METS without cardiac limitations. Therefore, based on ACC/AHA guidelines, the patient would be at acceptable risk for the planned procedure without further cardiovascular testing. The patient was advised that if she develops new symptoms prior to surgery to contact our office to arrange for a follow-up visit, and she verbalized understanding. The patient was advised that if she develops new symptoms prior to surgery to contact our office to arrange for a follow-up visit, and she verbalized understanding. Per protocol patient can hold ASA 81 mg 5 to 7 days prior to procedure."   Other pertinent history includes PONV, hypothyroid, OSA (no longer on CPAP since sleeve gastrectomy with paraesophageal hernia repair 2017), s/p C6-C7 disc arthroplasty.  Reports last dose of Ozempic 01/12/2023.  EKG 10/14/2022: NSR.  Rate 63.  LAD.  Right bundle branch block.  TTE 11/11/2022: 1. Left ventricular ejection fraction, by estimation, is 60 to 65%. The  left ventricle has normal function. The left ventricle has no regional  wall motion abnormalities. Left ventricular diastolic parameters are  consistent with Grade I diastolic  dysfunction (impaired relaxation). Elevated left ventricular end-diastolic  pressure. The average left ventricular global longitudinal strain is -25.7  %. The global longitudinal strain is normal.  2. Right ventricular systolic function is normal. The right  ventricular  size is normal.  3. Left atrial size was moderately dilated.  4. The mitral valve is abnormal. Mild mitral valve regurgitation. No  evidence of mitral stenosis. Moderate mitral annular calcification.  5. The  aortic valve is tricuspid. Aortic valve regurgitation is not  visualized. No aortic stenosis is present.  6. The inferior vena cava is normal in size with greater than 50%  respiratory variability, suggesting right atrial pressure of 3 mmHg.   Nuclear stress 10/15/2021:   The study is normal. The study is low risk.   No ST deviation was noted.   LV perfusion is normal. There is no evidence of ischemia. There is no evidence of infarction.   Left ventricular function is normal. Nuclear stress EF: 66 %. The left ventricular ejection fraction is hyperdynamic (>65%). End diastolic cavity size is normal. End systolic cavity size is normal.   Prior study available for comparison from 09/20/2014.  1. Fixed inferior perfusion defect with normal wall motion, consistent with artifact 2. Low risk study   )        Anesthesia Quick Evaluation

## 2023-02-02 ENCOUNTER — Observation Stay (HOSPITAL_COMMUNITY)
Admission: RE | Admit: 2023-02-02 | Discharge: 2023-02-03 | Disposition: A | Discharge status: Home / Self Care | Payer: Medicare PPO | Attending: Neurological Surgery | Admitting: Neurological Surgery

## 2023-02-02 ENCOUNTER — Encounter (HOSPITAL_COMMUNITY): Payer: Self-pay | Admitting: Neurological Surgery

## 2023-02-02 ENCOUNTER — Ambulatory Visit (HOSPITAL_BASED_OUTPATIENT_CLINIC_OR_DEPARTMENT_OTHER): Payer: Medicare PPO | Admitting: Anesthesiology

## 2023-02-02 ENCOUNTER — Other Ambulatory Visit: Payer: Self-pay

## 2023-02-02 ENCOUNTER — Ambulatory Visit (HOSPITAL_COMMUNITY): Payer: Self-pay | Admitting: Physician Assistant

## 2023-02-02 ENCOUNTER — Encounter (HOSPITAL_COMMUNITY)
Admission: RE | Disposition: A | Discharge status: Home / Self Care | Payer: Self-pay | Source: Home / Self Care | Attending: Neurological Surgery

## 2023-02-02 ENCOUNTER — Ambulatory Visit (HOSPITAL_COMMUNITY): Payer: Medicare PPO

## 2023-02-02 DIAGNOSIS — Z7982 Long term (current) use of aspirin: Secondary | ICD-10-CM | POA: Insufficient documentation

## 2023-02-02 DIAGNOSIS — Z981 Arthrodesis status: Secondary | ICD-10-CM | POA: Diagnosis not present

## 2023-02-02 DIAGNOSIS — E039 Hypothyroidism, unspecified: Secondary | ICD-10-CM | POA: Diagnosis not present

## 2023-02-02 DIAGNOSIS — I2510 Atherosclerotic heart disease of native coronary artery without angina pectoris: Secondary | ICD-10-CM | POA: Diagnosis not present

## 2023-02-02 DIAGNOSIS — M461 Sacroiliitis, not elsewhere classified: Secondary | ICD-10-CM | POA: Diagnosis not present

## 2023-02-02 DIAGNOSIS — G4733 Obstructive sleep apnea (adult) (pediatric): Secondary | ICD-10-CM | POA: Diagnosis not present

## 2023-02-02 DIAGNOSIS — Z79899 Other long term (current) drug therapy: Secondary | ICD-10-CM | POA: Diagnosis not present

## 2023-02-02 DIAGNOSIS — Z7984 Long term (current) use of oral hypoglycemic drugs: Secondary | ICD-10-CM | POA: Insufficient documentation

## 2023-02-02 DIAGNOSIS — Z96653 Presence of artificial knee joint, bilateral: Secondary | ICD-10-CM | POA: Insufficient documentation

## 2023-02-02 DIAGNOSIS — I10 Essential (primary) hypertension: Secondary | ICD-10-CM | POA: Diagnosis not present

## 2023-02-02 DIAGNOSIS — Z8616 Personal history of COVID-19: Secondary | ICD-10-CM | POA: Insufficient documentation

## 2023-02-02 HISTORY — PX: SACROILIAC JOINT FUSION: SHX6088

## 2023-02-02 SURGERY — SACROILIAC JOINT FUSION
Anesthesia: General | Site: Spine Lumbar | Laterality: Left

## 2023-02-02 MED ORDER — ONDANSETRON HCL 4 MG PO TABS: 4.0000 mg | ORAL_TABLET | Freq: Four times a day (QID) | ORAL | Status: DC | PRN

## 2023-02-02 MED ORDER — DIPHENHYDRAMINE HCL 25 MG PO CAPS: 25.0000 mg | ORAL_CAPSULE | Freq: Four times a day (QID) | ORAL | Status: DC | PRN

## 2023-02-02 MED ORDER — AMISULPRIDE (ANTIEMETIC) 5 MG/2ML IV SOLN
INTRAVENOUS | Status: AC
  Filled 2023-02-02: qty 4

## 2023-02-02 MED ORDER — VANCOMYCIN HCL 1000 MG IV SOLR
INTRAVENOUS | Status: AC
  Filled 2023-02-02: qty 20

## 2023-02-02 MED ORDER — PRAMIPEXOLE DIHYDROCHLORIDE 0.25 MG PO TABS
0.2500 mg | ORAL_TABLET | Freq: Every day | ORAL | Status: DC
  Administered 2023-02-02: 0.25 mg via ORAL
  Filled 2023-02-02: qty 1

## 2023-02-02 MED ORDER — METHOCARBAMOL 500 MG PO TABS
500.0000 mg | ORAL_TABLET | Freq: Four times a day (QID) | ORAL | Status: DC | PRN
  Administered 2023-02-02: 500 mg via ORAL
  Filled 2023-02-02: qty 1

## 2023-02-02 MED ORDER — LIDOCAINE-EPINEPHRINE 1 %-1:100000 IJ SOLN
INTRAMUSCULAR | Status: DC | PRN
  Administered 2023-02-02: 8.5 mL

## 2023-02-02 MED ORDER — LIDOCAINE-EPINEPHRINE 1 %-1:100000 IJ SOLN
INTRAMUSCULAR | Status: AC
  Filled 2023-02-02: qty 1

## 2023-02-02 MED ORDER — FENTANYL CITRATE (PF) 100 MCG/2ML IJ SOLN
INTRAMUSCULAR | Status: AC
  Filled 2023-02-02: qty 2

## 2023-02-02 MED ORDER — DOCUSATE SODIUM 100 MG PO CAPS
100.0000 mg | ORAL_CAPSULE | Freq: Two times a day (BID) | ORAL | Status: DC
  Administered 2023-02-02 (×2): 100 mg via ORAL
  Filled 2023-02-02 (×2): qty 1

## 2023-02-02 MED ORDER — METHOCARBAMOL 1000 MG/10ML IJ SOLN: 500.0000 mg | Freq: Four times a day (QID) | INTRAMUSCULAR | Status: DC | PRN

## 2023-02-02 MED ORDER — ACETAMINOPHEN 650 MG RE SUPP: 650.0000 mg | RECTAL | Status: DC | PRN

## 2023-02-02 MED ORDER — GLYCOPYRROLATE PF 0.2 MG/ML IJ SOSY
PREFILLED_SYRINGE | INTRAMUSCULAR | Status: DC | PRN
  Administered 2023-02-02 (×2): .1 mg via INTRAVENOUS

## 2023-02-02 MED ORDER — OXYCODONE HCL 5 MG PO TABS: 5.0000 mg | ORAL_TABLET | ORAL | Status: DC | PRN

## 2023-02-02 MED ORDER — SUGAMMADEX SODIUM 200 MG/2ML IV SOLN
INTRAVENOUS | Status: DC | PRN
  Administered 2023-02-02: 200 mg via INTRAVENOUS

## 2023-02-02 MED ORDER — PHENOL 1.4 % MT LIQD: 1.0000 | OROMUCOSAL | Status: DC | PRN

## 2023-02-02 MED ORDER — PROPOFOL 10 MG/ML IV BOLUS
INTRAVENOUS | Status: AC
  Filled 2023-02-02: qty 20

## 2023-02-02 MED ORDER — BUPIVACAINE LIPOSOME 1.3 % IJ SUSP
INTRAMUSCULAR | Status: DC | PRN
  Administered 2023-02-02: 20 mL

## 2023-02-02 MED ORDER — PHENYLEPHRINE 80 MCG/ML (10ML) SYRINGE FOR IV PUSH (FOR BLOOD PRESSURE SUPPORT)
PREFILLED_SYRINGE | INTRAVENOUS | Status: DC | PRN
  Administered 2023-02-02: 80 ug via INTRAVENOUS
  Administered 2023-02-02: 160 ug via INTRAVENOUS
  Administered 2023-02-02 (×4): 80 ug via INTRAVENOUS

## 2023-02-02 MED ORDER — SCOPOLAMINE 1 MG/3DAYS TD PT72
MEDICATED_PATCH | TRANSDERMAL | Status: DC | PRN
  Administered 2023-02-02: 1 via TRANSDERMAL

## 2023-02-02 MED ORDER — ACETAMINOPHEN 500 MG PO TABS
ORAL_TABLET | ORAL | Status: AC
  Administered 2023-02-02: 1000 mg
  Filled 2023-02-02: qty 1

## 2023-02-02 MED ORDER — THROMBIN 5000 UNITS EX SOLR: OROMUCOSAL | Status: DC | PRN

## 2023-02-02 MED ORDER — FENTANYL CITRATE (PF) 100 MCG/2ML IJ SOLN
25.0000 ug | INTRAMUSCULAR | Status: DC | PRN
Start: 2023-02-02 — End: 2023-02-02

## 2023-02-02 MED ORDER — FENTANYL CITRATE (PF) 100 MCG/2ML IJ SOLN
25.0000 ug | INTRAMUSCULAR | Status: DC | PRN
  Administered 2023-02-02: 50 ug via INTRAVENOUS

## 2023-02-02 MED ORDER — ACETAMINOPHEN 325 MG PO TABS: 650.0000 mg | ORAL_TABLET | ORAL | Status: DC | PRN

## 2023-02-02 MED ORDER — LACTATED RINGERS IV SOLN: INTRAVENOUS | Status: DC | PRN

## 2023-02-02 MED ORDER — CEFAZOLIN SODIUM-DEXTROSE 2-4 GM/100ML-% IV SOLN
2.0000 g | Freq: Four times a day (QID) | INTRAVENOUS | Status: AC
  Administered 2023-02-02 (×2): 2 g via INTRAVENOUS
  Filled 2023-02-02 (×2): qty 100

## 2023-02-02 MED ORDER — PROPRANOLOL HCL ER 80 MG PO CP24
80.0000 mg | ORAL_CAPSULE | Freq: Every day | ORAL | Status: DC
  Filled 2023-02-02: qty 1

## 2023-02-02 MED ORDER — BUPIVACAINE LIPOSOME 1.3 % IJ SUSP
INTRAMUSCULAR | Status: AC
  Filled 2023-02-02: qty 20

## 2023-02-02 MED ORDER — PROPOFOL 10 MG/ML IV BOLUS
INTRAVENOUS | Status: DC | PRN
  Administered 2023-02-02: 170 mg via INTRAVENOUS
  Administered 2023-02-02: 25 ug/kg/min via INTRAVENOUS

## 2023-02-02 MED ORDER — PRAMIPEXOLE DIHYDROCHLORIDE 0.25 MG PO TABS
0.3750 mg | ORAL_TABLET | Freq: Every day | ORAL | Status: DC
  Administered 2023-02-02: 0.375 mg via ORAL
  Filled 2023-02-02: qty 2

## 2023-02-02 MED ORDER — SUGAMMADEX SODIUM 200 MG/2ML IV SOLN: INTRAVENOUS | Status: DC | PRN

## 2023-02-02 MED ORDER — CEFAZOLIN SODIUM-DEXTROSE 2-4 GM/100ML-% IV SOLN
2.0000 g | INTRAVENOUS | Status: AC
  Administered 2023-02-02: 2 g via INTRAVENOUS
  Filled 2023-02-02: qty 100

## 2023-02-02 MED ORDER — ROSUVASTATIN CALCIUM 5 MG PO TABS: 5.0000 mg | ORAL_TABLET | Freq: Every day | ORAL | Status: DC

## 2023-02-02 MED ORDER — EZETIMIBE 10 MG PO TABS: 10.0000 mg | ORAL_TABLET | Freq: Every day | ORAL | Status: DC

## 2023-02-02 MED ORDER — BUPIVACAINE-EPINEPHRINE (PF) 0.5% -1:200000 IJ SOLN
INTRAMUSCULAR | Status: DC | PRN
  Administered 2023-02-02: 8.5 mL

## 2023-02-02 MED ORDER — ONDANSETRON HCL 4 MG/2ML IJ SOLN: 4.0000 mg | Freq: Once | INTRAMUSCULAR | Status: DC | PRN

## 2023-02-02 MED ORDER — SODIUM CHLORIDE 0.9% FLUSH: 3.0000 mL | INTRAVENOUS | Status: DC | PRN

## 2023-02-02 MED ORDER — CHLORHEXIDINE GLUCONATE CLOTH 2 % EX PADS
6.0000 | MEDICATED_PAD | Freq: Once | CUTANEOUS | Status: DC
Start: 2023-02-02 — End: 2023-02-02

## 2023-02-02 MED ORDER — EPHEDRINE SULFATE-NACL 50-0.9 MG/10ML-% IV SOSY
PREFILLED_SYRINGE | INTRAVENOUS | Status: DC | PRN
  Administered 2023-02-02 (×2): 5 mg via INTRAVENOUS
  Administered 2023-02-02: 2.5 mg via INTRAVENOUS

## 2023-02-02 MED ORDER — MIDAZOLAM HCL 2 MG/2ML IJ SOLN
INTRAMUSCULAR | Status: AC
  Filled 2023-02-02: qty 2

## 2023-02-02 MED ORDER — SCOPOLAMINE 1 MG/3DAYS TD PT72
MEDICATED_PATCH | TRANSDERMAL | Status: AC
  Filled 2023-02-02: qty 1

## 2023-02-02 MED ORDER — POLYETHYLENE GLYCOL 3350 17 G PO PACK
17.0000 g | PACK | Freq: Every day | ORAL | Status: DC | PRN
Start: 2023-02-02 — End: 2023-02-03

## 2023-02-02 MED ORDER — BUPIVACAINE-EPINEPHRINE (PF) 0.5% -1:200000 IJ SOLN
INTRAMUSCULAR | Status: AC
  Filled 2023-02-02: qty 30

## 2023-02-02 MED ORDER — ONDANSETRON HCL 4 MG/2ML IJ SOLN: 4.0000 mg | Freq: Four times a day (QID) | INTRAMUSCULAR | Status: DC | PRN

## 2023-02-02 MED ORDER — CHLORHEXIDINE GLUCONATE CLOTH 2 % EX PADS: 6.0000 | MEDICATED_PAD | Freq: Once | CUTANEOUS | Status: DC

## 2023-02-02 MED ORDER — LACTATED RINGERS IV SOLN
INTRAVENOUS | Status: DC
Start: 2023-02-02 — End: 2023-02-02

## 2023-02-02 MED ORDER — FENTANYL CITRATE (PF) 250 MCG/5ML IJ SOLN
INTRAMUSCULAR | Status: DC | PRN
  Administered 2023-02-02: 100 ug via INTRAVENOUS

## 2023-02-02 MED ORDER — THROMBIN 5000 UNITS EX SOLR
CUTANEOUS | Status: AC
  Filled 2023-02-02: qty 5000

## 2023-02-02 MED ORDER — MENTHOL 3 MG MT LOZG
1.0000 | LOZENGE | OROMUCOSAL | Status: DC | PRN
  Administered 2023-02-02: 3 mg via ORAL
  Filled 2023-02-02: qty 9

## 2023-02-02 MED ORDER — FENTANYL CITRATE (PF) 250 MCG/5ML IJ SOLN
INTRAMUSCULAR | Status: AC
  Filled 2023-02-02: qty 5

## 2023-02-02 MED ORDER — MIDAZOLAM HCL 2 MG/2ML IJ SOLN
INTRAMUSCULAR | Status: DC | PRN
  Administered 2023-02-02 (×2): 1 mg via INTRAVENOUS

## 2023-02-02 MED ORDER — ONDANSETRON HCL 4 MG/2ML IJ SOLN
INTRAMUSCULAR | Status: DC | PRN
  Administered 2023-02-02: 4 mg via INTRAVENOUS

## 2023-02-02 MED ORDER — DEXAMETHASONE SODIUM PHOSPHATE 10 MG/ML IJ SOLN
INTRAMUSCULAR | Status: DC | PRN
  Administered 2023-02-02: 10 mg via INTRAVENOUS

## 2023-02-02 MED ORDER — PROPOFOL 1000 MG/100ML IV EMUL
INTRAVENOUS | Status: AC
  Filled 2023-02-02: qty 100

## 2023-02-02 MED ORDER — OXYCODONE HCL 5 MG PO TABS
10.0000 mg | ORAL_TABLET | ORAL | Status: DC | PRN
  Administered 2023-02-02 – 2023-02-03 (×6): 10 mg via ORAL
  Filled 2023-02-02 (×6): qty 2

## 2023-02-02 MED ORDER — ALPRAZOLAM 0.5 MG PO TABS: 0.2500 mg | ORAL_TABLET | Freq: Four times a day (QID) | ORAL | Status: DC | PRN

## 2023-02-02 MED ORDER — LEVOTHYROXINE SODIUM 137 MCG PO TABS
137.0000 ug | ORAL_TABLET | Freq: Every day | ORAL | Status: DC
  Administered 2023-02-03: 137 ug via ORAL
  Filled 2023-02-02: qty 1

## 2023-02-02 MED ORDER — DEXAMETHASONE SODIUM PHOSPHATE 10 MG/ML IJ SOLN
INTRAMUSCULAR | Status: AC
  Filled 2023-02-02: qty 1

## 2023-02-02 MED ORDER — CHLORHEXIDINE GLUCONATE 0.12 % MT SOLN
15.0000 mL | Freq: Once | OROMUCOSAL | Status: AC
Start: 2023-02-02 — End: 2023-02-02
  Administered 2023-02-02: 15 mL via OROMUCOSAL
  Filled 2023-02-02: qty 15

## 2023-02-02 MED ORDER — PAROXETINE HCL 20 MG PO TABS
20.0000 mg | ORAL_TABLET | Freq: Two times a day (BID) | ORAL | Status: DC
  Administered 2023-02-02: 20 mg via ORAL
  Filled 2023-02-02 (×2): qty 1

## 2023-02-02 MED ORDER — HYDROMORPHONE HCL 1 MG/ML IJ SOLN: 0.5000 mg | INTRAMUSCULAR | Status: DC | PRN

## 2023-02-02 MED ORDER — SODIUM CHLORIDE 0.9 % IV SOLN: 250.0000 mL | INTRAVENOUS | Status: DC

## 2023-02-02 MED ORDER — VANCOMYCIN HCL 1 G IV SOLR
INTRAVENOUS | Status: DC | PRN
  Administered 2023-02-02: 1000 mg via TOPICAL

## 2023-02-02 MED ORDER — FLEET ENEMA RE ENEM: 1.0000 | ENEMA | Freq: Once | RECTAL | Status: DC | PRN

## 2023-02-02 MED ORDER — 0.9 % SODIUM CHLORIDE (POUR BTL) OPTIME
TOPICAL | Status: DC | PRN
  Administered 2023-02-02: 1000 mL

## 2023-02-02 MED ORDER — LIDOCAINE 2% (20 MG/ML) 5 ML SYRINGE
INTRAMUSCULAR | Status: DC | PRN
  Administered 2023-02-02: 80 mg via INTRAVENOUS

## 2023-02-02 MED ORDER — INSULIN ASPART 100 UNIT/ML IJ SOLN: 0.0000 [IU] | INTRAMUSCULAR | Status: DC | PRN

## 2023-02-02 MED ORDER — AMISULPRIDE (ANTIEMETIC) 5 MG/2ML IV SOLN
10.0000 mg | Freq: Once | INTRAVENOUS | Status: AC
  Administered 2023-02-02: 10 mg via INTRAVENOUS

## 2023-02-02 MED ORDER — SCOPOLAMINE 1 MG/3DAYS TD PT72
MEDICATED_PATCH | TRANSDERMAL | Status: AC
Start: 2023-02-02 — End: ?
  Filled 2023-02-02: qty 1

## 2023-02-02 MED ORDER — ROCURONIUM BROMIDE 100 MG/10ML IV SOLN
INTRAVENOUS | Status: DC | PRN
  Administered 2023-02-02: 60 mg via INTRAVENOUS

## 2023-02-02 MED ORDER — METFORMIN HCL 500 MG PO TABS
500.0000 mg | ORAL_TABLET | Freq: Two times a day (BID) | ORAL | Status: DC
  Administered 2023-02-02 – 2023-02-03 (×2): 500 mg via ORAL
  Filled 2023-02-02 (×2): qty 1

## 2023-02-02 MED ORDER — SODIUM CHLORIDE 0.9% FLUSH
3.0000 mL | Freq: Two times a day (BID) | INTRAVENOUS | Status: DC
  Administered 2023-02-02: 3 mL via INTRAVENOUS

## 2023-02-02 MED ORDER — ORAL CARE MOUTH RINSE: 15.0000 mL | Freq: Once | OROMUCOSAL | Status: AC

## 2023-02-02 SURGICAL SUPPLY — 57 items
BAG COUNTER SPONGE SURGICOUNT (BAG) ×2 IMPLANT
BONE CANC CHIPS 20CC PCAN1/4 (Bone Implant) ×2 IMPLANT
BUR 14 MATCH 3 (BUR) IMPLANT
BUR MR8 14 BALL 5 (BUR) IMPLANT
BURR 14 MATCH 3 (BUR) IMPLANT
BURR MR8 14 BALL 5 (BUR) IMPLANT
COVERAGE SUPPORT O-ARM STEALTH (MISCELLANEOUS) ×2 IMPLANT
DERMABOND ADVANCED .7 DNX12 (GAUZE/BANDAGES/DRESSINGS) ×2 IMPLANT
DEVICE FUSION THRD 40X12 (Joint) IMPLANT
DEVICE FUSION THRD 50X12 (Joint) IMPLANT
DRAIN JACKSON RD 7FR 3/32 (WOUND CARE) IMPLANT
DRAPE C-ARM 42X72 X-RAY (DRAPES) IMPLANT
DRAPE LAPAROTOMY 100X72X124 (DRAPES) ×2 IMPLANT
DRAPE SHEET LG 3/4 BI-LAMINATE (DRAPES) ×12 IMPLANT
DRAPE SURG 17X23 STRL (DRAPES) ×2 IMPLANT
DRSG OPSITE POSTOP 3X4 (GAUZE/BANDAGES/DRESSINGS) IMPLANT
DRSG OPSITE POSTOP 4X6 (GAUZE/BANDAGES/DRESSINGS) IMPLANT
DURAPREP 26ML APPLICATOR (WOUND CARE) ×2 IMPLANT
ELECT BLADE INSULATED 4IN (ELECTROSURGICAL) ×2 IMPLANT
ELECT COATED BLADE 2.86 ST (ELECTRODE) ×4 IMPLANT
ELECT REM PT RETURN 9FT ADLT (ELECTROSURGICAL) ×2 IMPLANT
ELECTRODE BLADE INSULATED 4IN (ELECTROSURGICAL) ×2 IMPLANT
ELECTRODE REM PT RTRN 9FT ADLT (ELECTROSURGICAL) ×2 IMPLANT
FEE COVERAGE SUPPORT O-ARM (MISCELLANEOUS) ×2 IMPLANT
GAUZE 4X4 16PLY ~~LOC~~+RFID DBL (SPONGE) IMPLANT
GAUZE SPONGE 4X4 12PLY STRL (GAUZE/BANDAGES/DRESSINGS) ×2 IMPLANT
GLOVE BIO SURGEON STRL SZ7 (GLOVE) ×4 IMPLANT
GLOVE BIOGEL PI IND STRL 7.5 (GLOVE) ×4 IMPLANT
GLOVE BIOGEL PI IND STRL 8 (GLOVE) ×2 IMPLANT
GLOVE ECLIPSE 8.0 STRL XLNG CF (GLOVE) ×2 IMPLANT
GOWN STRL REUS W/ TWL LRG LVL3 (GOWN DISPOSABLE) IMPLANT
GOWN STRL REUS W/ TWL XL LVL3 (GOWN DISPOSABLE) ×4 IMPLANT
GOWN STRL REUS W/TWL 2XL LVL3 (GOWN DISPOSABLE) IMPLANT
GRAFT BNE CANC CHIPS 1-8 20CC (Bone Implant) IMPLANT
HEMOSTAT POWDER KIT SURGIFOAM (HEMOSTASIS) ×2 IMPLANT
KIT BASIN OR (CUSTOM PROCEDURE TRAY) ×2 IMPLANT
KIT POSITION SURG JACKSON T1 (MISCELLANEOUS) ×2 IMPLANT
KIT TURNOVER KIT B (KITS) ×2 IMPLANT
MARKER SPHERE PSV REFLC NDI (MISCELLANEOUS) ×10 IMPLANT
NDL HYPO 21X1.5 SAFETY (NEEDLE) ×2 IMPLANT
NDL HYPO 25X1 1.5 SAFETY (NEEDLE) ×2 IMPLANT
NEEDLE HYPO 21X1.5 SAFETY (NEEDLE) ×2 IMPLANT
NEEDLE HYPO 25X1 1.5 SAFETY (NEEDLE) ×2 IMPLANT
NS IRRIG 1000ML POUR BTL (IV SOLUTION) ×2 IMPLANT
PACK LAMINECTOMY NEURO (CUSTOM PROCEDURE TRAY) ×2 IMPLANT
PAD ARMBOARD 7.5X6 YLW CONV (MISCELLANEOUS) ×6 IMPLANT
PATTIES SURGICAL .5 X1 (DISPOSABLE) IMPLANT
PIN BONE FIX 150 (PIN) IMPLANT
SPIKE FLUID TRANSFER (MISCELLANEOUS) ×2 IMPLANT
SPONGE T-LAP 4X18 ~~LOC~~+RFID (SPONGE) IMPLANT
STAPLER VISISTAT (STAPLE) ×2 IMPLANT
SUT VIC AB 2-0 CP2 18 (SUTURE) ×2 IMPLANT
SUT VIC AB 3-0 SH 8-18 (SUTURE) ×2 IMPLANT
SYR 20CC LL (SYRINGE) ×2 IMPLANT
TOWEL GREEN STERILE (TOWEL DISPOSABLE) IMPLANT
TOWEL GREEN STERILE FF (TOWEL DISPOSABLE) IMPLANT
WATER STERILE IRR 1000ML POUR (IV SOLUTION) ×2 IMPLANT

## 2023-02-02 NOTE — Anesthesia Postprocedure Evaluation (Signed)
Anesthesia Post Note  Patient: [name]  Procedure(s) Performed: MINIMALLY INVASIVE LEFT SACROILIAC FUSION (Left: Spine Lumbar) Application of O-Arm (Left)     Patient location during evaluation: PACU Anesthesia Type: General Level of consciousness: awake and alert Pain management: pain level controlled Vital Signs Assessment: post-procedure vital signs reviewed and stable Respiratory status: spontaneous breathing, nonlabored ventilation and respiratory function stable Cardiovascular status: blood pressure returned to baseline and stable Postop Assessment: no apparent nausea or vomiting Anesthetic complications: no   No notable events documented.  Last Vitals:  Vitals:   02/02/23 1000 02/02/23 1042  BP: (!) 106/57 136/82  Pulse: 78 72  Resp: 15 18  Temp: 36.7 C   SpO2: 99% 100%    Last Pain:  Vitals:   02/02/23 1205  TempSrc:   PainSc: 7                  Collene Schlichter

## 2023-02-02 NOTE — Anesthesia Procedure Notes (Signed)
Procedure Name: Intubation Date/Time: 02/02/2023 7:45 AM  Performed by: Marena Chancy, CRNAPre-anesthesia Checklist: Patient identified, Emergency Drugs available, Suction available and Patient being monitored Patient Re-evaluated:Patient Re-evaluated prior to induction Oxygen Delivery Method: Circle System Utilized Preoxygenation: Pre-oxygenation with 100% oxygen Induction Type: IV induction Ventilation: Mask ventilation without difficulty Laryngoscope Size: Glidescope and 3 Grade View: Grade I Tube type: Oral Tube size: 7.0 mm Number of attempts: 1 Airway Equipment and Method: Stylet and Oral airway Placement Confirmation: ETT inserted through vocal cords under direct vision, positive ETCO2 and breath sounds checked- equal and bilateral Tube secured with: Tape Dental Injury: Teeth and Oropharynx as per pre-operative assessment

## 2023-02-02 NOTE — H&P (Signed)
Providing Compassionate, Quality Care - Together  NEUROSURGERY HISTORY & PHYSICAL   [name] is an 68 y.o. female.   Chief Complaint: Sacroiliitis HPI: This is a 68 year old female with a history of progressively worsening left sacroiliac pain.  This been altering her daily lifestyle, causing her difficulty performing ADLs.  She has a history of lumbar fusion, and right sacroiliac fusion.  Her pain has been quite severe, and she has failed multiple conservative measures including sacroiliac injections.  She presents today for surgical intervention.  Past Medical History:  Diagnosis Date   Anginal pain (HCC)    "on and off" (11/01/2011) - anxiety related   Anxiety    takes Paxil daily and Xanax prn   Arthritis    "knees" (10/16/2013)   Chronic back pain    facet disease and bulging disc; "thoracic and lower back" (10/16/2013)   Constipation    r/t pain meds;takes stool softener daily   COVID 01/2022   Dysrhythmia    rapid HR on occasion-takes Propranolol daily   Family history of anesthesia complication    ":PONV; mom and sisters"   History of bladder infections    > 44yr ago   History of kidney stones    Hyperlipidemia    takes LIpitor daily   Hypothyroidism    takes Synthroid daily   Insomnia    Joint pain    Joint swelling    Muscle spasms of head or neck    lumbar and thoracic;takes Robaxin prn   Peripheral neuropathy    PONV (postoperative nausea and vomiting)    Rosacea    Seasonal allergies    takes allegra prn   Sleep apnea     Past Surgical History:  Procedure Laterality Date   ABDOMINAL HYSTERECTOMY  1980's   ANAL FISSURE REPAIR  ~ 2011   "banded hemorrhoids at this time too"   BACK SURGERY  10/20/2018   lumbar fusion L4-5, L5-S1   BARIATRIC SURGERY     CARDIAC CATHETERIZATION  ~ 1999   CARPAL TUNNEL RELEASE Left 09/13/2017   & ulnar nerve release   CERVICAL DISC SURGERY  03/2017   C5, C6    COLONOSCOPY      ESOPHAGOGASTRODUODENOSCOPY     KNEE ARTHROPLASTY Left 2007   KNEE ARTHROSCOPY Right    KNEE ARTHROSCOPY Right    "torn meniscus"   KNEE SURGERY Left 1982-2013   "17 before 10/31/2013"   LEFT OOPHORECTOMY  1980's   PATELLECTOMY Right    PLANTAR FASCIA SURGERY Bilateral 1990's   REVISION TOTAL KNEE ARTHROPLASTY Left 11/01/2011   SACROILIAC JOINT FUSION Right 02/01/2022   Procedure: Minimally Invasive Surgery Sacroiliac Fusion ,Right;  Surgeon: Cristen Murcia, Alan Mulder, DO;  Location: [facility] OR;  Service: Neurosurgery;  Laterality: Right;   TOTAL KNEE ARTHROPLASTY Right 10/15/2013   Procedure: RIGHT TOTAL KNEE ARTHROPLASTY;  Surgeon: Valeria Batman, MD;  Location: [facility];  Service: Orthopedics;  Laterality: Right;   TOTAL KNEE REVISION  11/01/2011   Procedure: TOTAL KNEE REVISION;  Surgeon: Valeria Batman, MD;  Location: [facility] OR;  Service: Orthopedics;  Laterality: Left;  Revision Left Total Knee Replacement    TOTAL KNEE REVISION Right 09/25/2020   Procedure: RIGHT POLY LINER EXCHANGE;  Surgeon: Kathryne Hitch, MD;  Location: [facility] ORS;  Service: Orthopedics;  Laterality: Right;   TUBAL LIGATION  1981    Family History  Problem Relation Age of Onset   Heart disease Mother 91   Heart failure Mother  Heart attack Father    Heart disease Father 25   Breast cancer Sister        has had it 2 times   Social History:  reports that she has never smoked. She has never used smokeless tobacco. She reports that she does not drink alcohol and does not use drugs.  Allergies:  Allergies  Allergen Reactions   Morphine And Codeine Other (See Comments)    Feels like bugs crawling over her   Nsaids Hives   Tape Other (See Comments)    Blisters with tape after surgical procedures   Tapentadol Other (See Comments)    Blisters with tape after surgical procedures   Buspar [Buspirone] Palpitations   Ciprofloxacin Nausea Only    Upset stomach    Codeine Other (See Comments)    Hyper   Gabapentin  Other (See Comments)    Suicidal ideation.   Levothyroxine     Other reaction(s): hot flashes,    Lodine [Etodolac] Nausea Only    Upset Stomach    Lyrica [Pregabalin] Other (See Comments)    suicidal ideation   Morphine Sulfate Hives   Naproxen Sodium    Niacin And Related Other (See Comments)    flushing   Nitrofurantoin Hives   Pravastatin Other (See Comments)    achiness    Septra [Sulfamethoxazole-Trimethoprim] Other (See Comments)    Stomach pains   Tramadol     Other reaction(s): Pt reports drug interaction with Paxil   Tylox [Oxycodone-Acetaminophen] Other (See Comments)    Hyper   Tyloxapol     Other reaction(s): hyper   Wellbutrin [Bupropion] Other (See Comments)    dizzy    Medications Prior to Admission  Medication Sig Dispense Refill   acetaminophen (TYLENOL) 500 MG tablet Take 500 mg by mouth every 6 (six) hours as needed for moderate pain.     Acetylcysteine (NAC PO) Take 2 capsules by mouth daily.     ALPRAZolam (XANAX) 0.25 MG tablet Take 1 tablet (0.25 mg total) by mouth 4 (four) times daily as needed for anxiety. 120 tablet 2   aspirin EC 81 MG tablet Take 1 tablet (81 mg total) by mouth daily. Swallow whole. 90 tablet 3   Azelaic Acid 15 % gel Apply 1 Application topically 2 (two) times daily as needed (rosacea).     B Complex Vitamins (B COMPLEX PO) Place 1 tablet under the tongue daily.     Cholecalciferol (VITAMIN D-3) 5000 UNITS TABS Take 5,000 Units by mouth daily.     clindamycin (CLEOCIN T) 1 % external solution Apply 1 Application topically daily as needed (rosacea).     ezetimibe (ZETIA) 10 MG tablet Take 10 mg by mouth daily.      fexofenadine (ALLEGRA) 180 MG tablet Take 180 mg by mouth daily as needed for allergies.      fluticasone (FLONASE) 50 MCG/ACT nasal spray Place 1-2 sprays into both nostrils daily as needed for allergies or rhinitis.     HYDROcodone-acetaminophen (NORCO/VICODIN) 5-325 MG tablet Take 1-2 tablets by mouth every 4 (four)  hours as needed for moderate pain. 30 tablet 0   levothyroxine (SYNTHROID) 137 MCG tablet Take 137 mcg by mouth daily before breakfast.     lidocaine 4 % Place 1 patch onto the skin daily as needed (pain).     metFORMIN (GLUCOPHAGE) 500 MG tablet Take 500 mg by mouth 2 (two) times daily with a meal.     methocarbamol (ROBAXIN) 500 MG tablet Take 500  mg by mouth every 12 (twelve) hours as needed for muscle spasms.     metroNIDAZOLE (METROGEL) 0.75 % gel Apply 1 Application topically 2 (two) times daily as needed (rosacea).     Multiple Vitamin (MULTIVITAMIN WITH MINERALS) TABS tablet Take 1 tablet by mouth daily.     OZEMPIC, 0.25 OR 0.5 MG/DOSE, 2 MG/3ML SOPN Inject 0.25 mg into the skin every 7 (seven) days.     PARoxetine (PAXIL) 20 MG tablet TAKE 1 TABLET BY MOUTH IN THE MORNING ATNOON AND AT BEDTIME (Patient taking differently: Take 20 mg by mouth in the morning and at bedtime. TAKE 1 TABLET BY MOUTH IN THE MORNING ATNOON AND AT BEDTIME) 270 tablet 3   pramipexole (MIRAPEX) 0.25 MG tablet TAKE ONE TABLET BY MOUTH IN THE MORNING AND ONE AND ONE HALF (1/2) TABLET BY MOUTH AT NIGHT. 225 tablet 3   propranolol ER (INDERAL LA) 80 MG 24 hr capsule Take 80 mg by mouth daily.     rosuvastatin (CRESTOR) 5 MG tablet TAKE ONE TABLET BY MOUTH ONCE A DAY 90 tablet 3   amoxicillin (AMOXIL) 500 MG capsule Take 2,000 mg by mouth See admin instructions. Take 2000 mg by mouth 1 hour prior to dental procedure.     minocycline (MINOCIN) 100 MG capsule Take 100 mg by mouth 2 (two) times daily as needed (rosacea flare).      No results found for this or any previous visit (from the past 48 hours). No results found.  ROS All pertinent positives and negatives are listed in HPI above  Blood pressure (!) 145/60, pulse 73, temperature 98.3 F (36.8 C), temperature source Oral, resp. rate 16, height 5\' 5"  (1.651 m), weight 116.1 kg, SpO2 97%. Physical Exam  Awake alert oriented x 3, no acute distress Speech fluent  and appropriate PERRLA Cranial nerves II through XII intact Full strength in upper and lower extremities Positive SI tenderness, left Positive Pearlean Brownie, left  Assessment/Plan 68 year old female with  Left sacroiliitis   -OR today for minimally invasive left sacroiliac fusion.  We discussed all risks, benefits and expected outcomes as well as alternatives.  She failed multiple conservative measures.  Informed consent was obtained and witnessed.  Thank you for allowing me to participate in this patient's care.  Please do not hesitate to call with questions or concerns.   Monia Pouch, DO Neurosurgeon [facility] [phone]

## 2023-02-02 NOTE — Transfer of Care (Signed)
Immediate Anesthesia Transfer of Care Note  Patient: [name]  Procedure(s) Performed: MINIMALLY INVASIVE LEFT SACROILIAC FUSION (Left: Spine Lumbar) Application of O-Arm (Left)  Patient Location: PACU  Anesthesia Type:General  Level of Consciousness: awake, alert , and oriented  Airway & Oxygen Therapy: Patient Spontanous Breathing and Patient connected to nasal cannula oxygen  Post-op Assessment: Report given to RN and Post -op Vital signs reviewed and stable  Post vital signs: Reviewed and stable  Last Vitals:  Vitals Value Taken Time  BP    Temp 36.4 C 02/02/23 0922  Pulse    Resp    SpO2      Last Pain:  Vitals:   02/02/23 0625  TempSrc:   PainSc: 7       Patients Stated Pain Goal: 3 (02/02/23 0625)  Complications: No notable events documented.

## 2023-02-02 NOTE — Op Note (Addendum)
    Providing Compassionate, Quality Care - Together   Date of service: 02/02/2023   PREOP DIAGNOSIS:  Left sacroiliitis   POSTOP DIAGNOSIS: Same   PROCEDURE: Left minimally invasive sacroiliac arthrodesis; [employer] [place] 12.5 x 50 mm superiorly, 12.5 by 40 mm inferiorly Intraoperative use of O-arm, Stealth for neuro navigation Intraoperative use of autograft, same incision Intraoperative use of allograft   SURGEON: Dr. Kendell Bane C. Marigrace Mccole, DO   ASSISTANT: Patrici Ranks, PA   ANESTHESIA: General Endotracheal   EBL: Minimal   SPECIMENS: None   DRAINS: None   COMPLICATIONS: None   CONDITION: Hemodynamically stable   HISTORY: This is a 68 year old female with a history of lumbar fusion, with complaints of left sacroiliac pain.  She underwent multiple conservative measures without longstanding relief of her pain.  She had positive diagnostic test in terms of a sacroiliac block.  Therefore offered her a left sacroiliac minimally invasive fusion, we went over all risks, benefits and expected outcomes as well as alternatives to treatment.  Informed consent was obtained and witnessed.   PROCEDURE IN DETAIL: The patient was brought to the operating room. After induction of general anesthesia, the patient was positioned on the operative table in the prone position. All pressure points were meticulously padded. Skin incision was then marked out and prepped and draped in the usual sterile fashion. Physician driven timeout was performed.   Using a 10 blade, a small stab incision was created over the right PSIS, and the percutaneous pin was placed into the PSIS with appropriate bony purchase.  Navigation frame was then placed, the field was sterilely covered and the O-arm was brought in for an initial intraoperative scan.  The scan was then verified with neuro navigation to have excellent accuracy.   I then used neuro navigation to plan the inferior and superior implants from the ilium into  the sacrum avoiding the foramina.  Using a 10 blade, I made sharp incision and the planned trajectory was then tapped with a 10 mm tap on the inferior trajectory which had appropriate bony purchase across the ilium and sacrum.  Autograft from the tap was saved.  I then selected a 40 mm implant, filled with autograft and allograft this was placed with appropriate bony purchase.   This was then repeated superiorly from the ilium to the sacrum again with care to not violate the sacral foramina.  A 50 mm implant was selected and placed with appropriate bony purchase.  The wounds were hemostased with passive hemostatic.  I then sterilely covered the field again and performed an intraoperative scan to verify appropriate hardware placement.  Imaging was reviewed and noted to be appropriately placed across the SI joint with no foramina violation.   The wounds were noted to be excellently hemostatic, long-acting anesthetic was placed in the wounds as well as vancomycin powder.  I removed the percutaneous pin.  I then closed the wounds with 2-0 and 3-0 Vicryl suture.  Skin was closed with skin glue, sterile dressing was applied.   At the end of the case all sponge, needle, and instrument counts were correct. The patient was then transferred to the stretcher, extubated, and taken to the post-anesthesia care unit in stable hemodynamic condition.

## 2023-02-03 ENCOUNTER — Other Ambulatory Visit (HOSPITAL_COMMUNITY): Payer: Self-pay

## 2023-02-03 DIAGNOSIS — M461 Sacroiliitis, not elsewhere classified: Secondary | ICD-10-CM | POA: Diagnosis not present

## 2023-02-03 DIAGNOSIS — E039 Hypothyroidism, unspecified: Secondary | ICD-10-CM | POA: Diagnosis not present

## 2023-02-03 DIAGNOSIS — Z79899 Other long term (current) drug therapy: Secondary | ICD-10-CM | POA: Diagnosis not present

## 2023-02-03 DIAGNOSIS — Z96653 Presence of artificial knee joint, bilateral: Secondary | ICD-10-CM | POA: Diagnosis not present

## 2023-02-03 DIAGNOSIS — Z7982 Long term (current) use of aspirin: Secondary | ICD-10-CM | POA: Diagnosis not present

## 2023-02-03 DIAGNOSIS — I10 Essential (primary) hypertension: Secondary | ICD-10-CM | POA: Diagnosis not present

## 2023-02-03 DIAGNOSIS — Z7984 Long term (current) use of oral hypoglycemic drugs: Secondary | ICD-10-CM | POA: Diagnosis not present

## 2023-02-03 DIAGNOSIS — Z8616 Personal history of COVID-19: Secondary | ICD-10-CM | POA: Diagnosis not present

## 2023-02-03 MED ORDER — OXYCODONE HCL 5 MG PO TABS
5.0000 mg | ORAL_TABLET | ORAL | 0 refills | Status: DC | PRN
  Filled 2023-02-03: qty 30, 5d supply, fill #0

## 2023-02-03 MED ORDER — METHOCARBAMOL 500 MG PO TABS
500.0000 mg | ORAL_TABLET | Freq: Four times a day (QID) | ORAL | 1 refills | Status: AC | PRN
  Filled 2023-02-03: qty 120, 30d supply, fill #0

## 2023-02-03 NOTE — Discharge Instructions (Signed)
 Wound Care Keep incision covered and dry until post op day 3. You may remove the Honeycomb dressing on post op day 3. Leave steri-strips on back.  They will fall off by themselves. Do not put any creams, lotions, or ointments on incision. You are fine to shower. Let water run over incision and pat dry.   Activity Walk each and every day, increasing distance each day. No lifting greater than 8 lbs.  No lifting no bending no twisting no driving ,you can ride as a passenger  locally  Diet Resume your normal diet.   Return to Work Will be discussed at your follow up appointment.  Call Your Doctor If Any of These Occur Redness, drainage, or swelling at the wound.  Temperature greater than 101 degrees. Severe pain not relieved by pain medication. Incision starts to come apart.  Follow Up Appt Call [phone] if you have one or any problem.

## 2023-02-03 NOTE — Progress Notes (Signed)
OT Screen Note  Patient Details Name: [name] MRN: [id number] DOB: 20-Sep-1955   Cancelled Treatment:    Reason Eval/Treat Not Completed: OT screened, no needs identified, will sign off (Discussed with PT, pt has no acute skilled OT needs. OT signing off.)  02/03/2023  AB, OTR/L  Acute Rehabilitation Services  Office: [phone]   Tristan Schroeder 02/03/2023, 8:05 AM

## 2023-02-03 NOTE — Evaluation (Signed)
Physical Therapy Brief Evaluation and Discharge Note Patient Details Name: [name] MRN: [id number] DOB: 1955/11/16 Today's Date: 02/03/2023   History of Present Illness  68 yo female admitted 1/30 for Lt SI fusion. PMhx: Rt SI fusion, lumbar fusion, anxiety, HLD, insomnia, Rt TKA  Clinical Impression  Pt pleasant and reports moving well and with decreased pain compared to pre-op. Pt with tDWB after Rt SI fusion and states MD would want the same, RN echoed this despite no formal weight bearing order. Pt educated for gait with decreased weight on LLE however pt able to maintain PWB but not TDWB due to body habitus and strength. Pt educated for basic mobility and answered questions for ADLs assist with spouse present. All education complete without futher needs and pt agreeable.        PT Assessment Patient does not need any further PT services  Assistance Needed at Discharge  PRN    Equipment Recommendations None recommended by PT  Recommendations for Other Services       Precautions/Restrictions Precautions Precautions: Other (comment) Restrictions Weight Bearing Restrictions Per Provider Order: Yes LLE Weight Bearing Per Provider Order: Touchdown weight bearing Other Position/Activity Restrictions: per RN        Mobility  Bed Mobility   Supine/Sidelying to sit: Modified independent (Device/Increased time) Sit to supine/sidelying: Modified independent (Device/Increased time)    Transfers Overall transfer level: Needs assistance   Transfers: Sit to/from Stand Sit to Stand: Supervision           General transfer comment: cues for sequence with limited weight bearing LLE    Ambulation/Gait Ambulation/Gait assistance: Supervision Gait Distance (Feet): 100 Feet Assistive device: Rolling walker (2 wheels) Gait Pattern/deviations: Step-to pattern Gait Speed: Below normal General Gait Details: pt with toe strike and PWB on LLE, unable to maintain TDWB  but limiting gait within pain and cues for increased weight bearing through UB  Home Activity Instructions    Stairs            Modified Rankin (Stroke Patients Only)        Balance Overall balance assessment: No apparent balance deficits (not formally assessed)                        Pertinent Vitals/Pain PT - Brief Vital Signs All Vital Signs Stable: Yes Pain Assessment Pain Assessment: No/denies pain     Home Living Family/patient expects to be discharged to:: Private residence Living Arrangements: Spouse/significant other Available Help at Discharge: Family;Available 24 hours/day Home Environment: Level entry   Home Equipment: Agricultural consultant (2 wheels);Crutches;BSC/3in1;Cane - single point        Prior Function Level of Independence: Independent Comments: spouse does heavy housework    UE/LE Assessment   UE ROM/Strength/Tone/Coordination: WFL    LE ROM/Strength/Tone/Coordination: [facility]      Communication   Communication Communication: No apparent difficulties Cueing Techniques: Verbal cues     Cognition Overall Cognitive Status: Appears within functional limits for tasks assessed/performed       General Comments      Exercises     Assessment/Plan    PT Problem List         PT Visit Diagnosis Other abnormalities of gait and mobility (R26.89)    No Skilled PT All education completed;Patient will have necessary level of assist by caregiver at discharge   Co-evaluation                AMPAC 6 Clicks Help  needed turning from your back to your side while in a flat bed without using bedrails?: None Help needed moving from lying on your back to sitting on the side of a flat bed without using bedrails?: None Help needed moving to and from a bed to a chair (including a wheelchair)?: A Little Help needed standing up from a chair using your arms (e.g., wheelchair or bedside chair)?: A Little Help needed to walk in hospital room?:  A Little Help needed climbing 3-5 steps with a railing? : A Little 6 Click Score: 20      End of Session   Activity Tolerance: Patient tolerated treatment well Patient left: in bed;with call bell/phone within reach;with family/visitor present Nurse Communication: Mobility status PT Visit Diagnosis: Other abnormalities of gait and mobility (R26.89)     Time: 1610-9604 PT Time Calculation (min) (ACUTE ONLY): 16 min  Charges:   PT Evaluation $PT Eval Low Complexity: 1 Low      Teddie Curd P, PT Acute Rehabilitation Services Office: [phone]   Enedina Finner Bless Lisenby  02/03/2023, 9:06 AM

## 2023-02-03 NOTE — Care Management Obs Status (Signed)
MEDICARE OBSERVATION STATUS NOTIFICATION   Patient Details  Name: [name] MRN: [id number] Date of Birth: 09-Jul-1955   Medicare Observation Status Notification Given:  Yes    Kermit Balo, RN 02/03/2023, 8:52 AM

## 2023-02-03 NOTE — Progress Notes (Signed)
 Patient alert and oriented, voiding adequately, skin clean, dry and intact without evidence of skin break down, or symptoms of complications - no redness or edema noted, only slight tenderness at site.  Patient states pain is manageable at time of discharge. Room was checked and accounted for all patient's belongings; discharge instructions concerning her medications, incision care, follow up appointment and when to call the doctor as needed were all discussed with patient by RN and she expressed understanding on the instructions given.

## 2023-02-03 NOTE — Plan of Care (Signed)
  Problem: Education: Goal: Knowledge of General Education information will improve Description: Including pain rating scale, medication(s)/side effects and non-pharmacologic comfort measures Outcome: Completed/Met   Problem: Health Behavior/Discharge Planning: Goal: Ability to manage health-related needs will improve Outcome: Completed/Met   Problem: Clinical Measurements: Goal: Ability to maintain clinical measurements within normal limits will improve Outcome: Completed/Met Goal: Will remain free from infection Outcome: Completed/Met Goal: Diagnostic test results will improve Outcome: Completed/Met Goal: Respiratory complications will improve Outcome: Completed/Met Goal: Cardiovascular complication will be avoided Outcome: Completed/Met   Problem: Activity: Goal: Risk for activity intolerance will decrease Outcome: Completed/Met   Problem: Nutrition: Goal: Adequate nutrition will be maintained Outcome: Completed/Met   Problem: Coping: Goal: Level of anxiety will decrease Outcome: Completed/Met   Problem: Elimination: Goal: Will not experience complications related to bowel motility Outcome: Completed/Met Goal: Will not experience complications related to urinary retention Outcome: Completed/Met   Problem: Pain Managment: Goal: General experience of comfort will improve and/or be controlled Outcome: Completed/Met   Problem: Safety: Goal: Ability to remain free from injury will improve Outcome: Completed/Met   Problem: Skin Integrity: Goal: Risk for impaired skin integrity will decrease Outcome: Completed/Met   Problem: Education: Goal: Ability to verbalize activity precautions or restrictions will improve Outcome: Completed/Met Goal: Knowledge of the prescribed therapeutic regimen will improve Outcome: Completed/Met Goal: Understanding of discharge needs will improve Outcome: Completed/Met   Problem: Activity: Goal: Ability to avoid complications of  mobility impairment will improve Outcome: Completed/Met Goal: Ability to tolerate increased activity will improve Outcome: Completed/Met Goal: Will remain free from falls Outcome: Completed/Met   Problem: Bowel/Gastric: Goal: Gastrointestinal status for postoperative course will improve Outcome: Completed/Met   Problem: Clinical Measurements: Goal: Ability to maintain clinical measurements within normal limits will improve Outcome: Completed/Met Goal: Postoperative complications will be avoided or minimized Outcome: Completed/Met Goal: Diagnostic test results will improve Outcome: Completed/Met   Problem: Pain Management: Goal: Pain level will decrease Outcome: Completed/Met   Problem: Skin Integrity: Goal: Will show signs of wound healing Outcome: Completed/Met

## 2023-02-03 NOTE — Discharge Summary (Signed)
Physician Discharge Summary  Patient ID: [name] MRN: [id number] DOB/AGE: [birth year] 68 y.o.  Admit date: 02/02/2023 Discharge date: 02/03/2023  Admission Diagnoses:  Left sacroiliitis  Discharge Diagnoses:  Same Principal Problem:   Sacroiliitis [facility])   Discharged Condition: Stable  Hospital Course:  [name] is a 68 y.o. female underwent an elective left sacroiliac joint fusion.  She tolerated surgery well.  Was monitored overnight, ambulating independently.  Her pain was controlled on oral medication, she was having normal bowel bladder function.  Her preoperative sacroiliac pain was improved.  Her wounds were clean dry and intact.  Treatments: Surgery -left sacroiliac fusion  Discharge Exam: Blood pressure 124/75, pulse 72, temperature 97.9 F (36.6 C), temperature source Oral, resp. rate 20, height 5\' 5"  (1.651 m), weight 116.1 kg, SpO2 93%. Awake, alert, oriented x 3 Speech fluent, appropriate CN grossly intact 5/5 BUE/BLE Wound c/d/i  Disposition: Discharge disposition: 01-Home or Self Care       Discharge Instructions     Incentive spirometry RT   Complete by: As directed       Allergies as of 02/03/2023       Reactions   Morphine And Codeine Other (See Comments)   Feels like bugs crawling over her   Nsaids Hives   Tape Other (See Comments)   Blisters with tape after surgical procedures   Tapentadol Other (See Comments)   Blisters with tape after surgical procedures   Buspar [buspirone] Palpitations   Ciprofloxacin Nausea Only   Upset stomach   Codeine Other (See Comments)   Hyper   Gabapentin Other (See Comments)   Suicidal ideation.   Levothyroxine    Other reaction(s): hot flashes,    Lodine [etodolac] Nausea Only   Upset Stomach   Lyrica [pregabalin] Other (See Comments)   suicidal ideation   Morphine Sulfate Hives   Naproxen Sodium    Niacin And Related Other (See Comments)   flushing   Nitrofurantoin  Hives   Pravastatin Other (See Comments)   achiness   Septra [sulfamethoxazole-trimethoprim] Other (See Comments)   Stomach pains   Tramadol    Other reaction(s): Pt reports drug interaction with Paxil   Tylox [oxycodone-acetaminophen] Other (See Comments)   Hyper   Tyloxapol    Other reaction(s): hyper   Wellbutrin [bupropion] Other (See Comments)   dizzy        Medication List     PAUSE taking these medications    aspirin EC 81 MG tablet Wait to take this until: February 09, 2023 Take 1 tablet (81 mg total) by mouth daily. Swallow whole.       STOP taking these medications    HYDROcodone-acetaminophen 5-325 MG tablet Commonly known as: NORCO/VICODIN       TAKE these medications    acetaminophen 500 MG tablet Commonly known as: TYLENOL Take 500 mg by mouth every 6 (six) hours as needed for moderate pain.   ALPRAZolam 0.25 MG tablet Commonly known as: XANAX Take 1 tablet (0.25 mg total) by mouth 4 (four) times daily as needed for anxiety.   amoxicillin 500 MG capsule Commonly known as: AMOXIL Take 2,000 mg by mouth See admin instructions. Take 2000 mg by mouth 1 hour prior to dental procedure.   Azelaic Acid 15 % gel Apply 1 Application topically 2 (two) times daily as needed (rosacea).   B COMPLEX PO Place 1 tablet under the tongue daily.   clindamycin 1 % external solution Commonly known as: CLEOCIN T Apply  1 Application topically daily as needed (rosacea).   ezetimibe 10 MG tablet Commonly known as: ZETIA Take 10 mg by mouth daily.   fexofenadine 180 MG tablet Commonly known as: ALLEGRA Take 180 mg by mouth daily as needed for allergies.   fluticasone 50 MCG/ACT nasal spray Commonly known as: FLONASE Place 1-2 sprays into both nostrils daily as needed for allergies or rhinitis.   lidocaine 4 % Place 1 patch onto the skin daily as needed (pain).   metFORMIN 500 MG tablet Commonly known as: GLUCOPHAGE Take 500 mg by mouth 2 (two) times  daily with a meal.   methocarbamol 500 MG tablet Commonly known as: ROBAXIN Take 1 tablet (500 mg total) by mouth every 6 (six) hours as needed for muscle spasms. What changed: when to take this   metroNIDAZOLE 0.75 % gel Commonly known as: METROGEL Apply 1 Application topically 2 (two) times daily as needed (rosacea).   minocycline 100 MG capsule Commonly known as: MINOCIN Take 100 mg by mouth 2 (two) times daily as needed (rosacea flare).   multivitamin with minerals Tabs tablet Take 1 tablet by mouth daily.   NAC PO Take 2 capsules by mouth daily.   oxyCODONE 5 MG immediate release tablet Commonly known as: Oxy IR/ROXICODONE Take 1 tablet (5 mg total) by mouth every 4 (four) hours as needed for moderate pain (pain score 4-6).   Ozempic (0.25 or 0.5 MG/DOSE) 2 MG/3ML Sopn Generic drug: Semaglutide(0.25 or 0.5MG /DOS) Inject 0.25 mg into the skin every 7 (seven) days.   PARoxetine 20 MG tablet Commonly known as: PAXIL TAKE 1 TABLET BY MOUTH IN THE MORNING ATNOON AND AT BEDTIME What changed:  how much to take how to take this when to take this   pramipexole 0.25 MG tablet Commonly known as: MIRAPEX TAKE ONE TABLET BY MOUTH IN THE MORNING AND ONE AND ONE HALF (1/2) TABLET BY MOUTH AT NIGHT.   propranolol ER 80 MG 24 hr capsule Commonly known as: INDERAL LA Take 80 mg by mouth daily.   rosuvastatin 5 MG tablet Commonly known as: CRESTOR TAKE ONE TABLET BY MOUTH ONCE A DAY   Synthroid 137 MCG tablet Generic drug: levothyroxine Take 137 mcg by mouth daily before breakfast.   Vitamin D-3 125 MCG (5000 UT) Tabs Take 5,000 Units by mouth daily.         SignedAlan Mulder Amoree Newlon 02/03/2023, 8:34 AM

## 2023-04-07 DIAGNOSIS — M7062 Trochanteric bursitis, left hip: Secondary | ICD-10-CM | POA: Diagnosis not present

## 2023-04-07 DIAGNOSIS — Z6841 Body Mass Index (BMI) 40.0 and over, adult: Secondary | ICD-10-CM | POA: Diagnosis not present

## 2023-04-07 DIAGNOSIS — M461 Sacroiliitis, not elsewhere classified: Secondary | ICD-10-CM | POA: Diagnosis not present

## 2023-04-19 DIAGNOSIS — M797 Fibromyalgia: Secondary | ICD-10-CM | POA: Diagnosis not present

## 2023-04-19 DIAGNOSIS — L719 Rosacea, unspecified: Secondary | ICD-10-CM | POA: Diagnosis not present

## 2023-04-19 DIAGNOSIS — E7800 Pure hypercholesterolemia, unspecified: Secondary | ICD-10-CM | POA: Diagnosis not present

## 2023-04-19 DIAGNOSIS — J301 Allergic rhinitis due to pollen: Secondary | ICD-10-CM | POA: Diagnosis not present

## 2023-04-19 DIAGNOSIS — G8929 Other chronic pain: Secondary | ICD-10-CM | POA: Diagnosis not present

## 2023-04-19 DIAGNOSIS — R7303 Prediabetes: Secondary | ICD-10-CM | POA: Diagnosis not present

## 2023-04-19 DIAGNOSIS — Z6841 Body Mass Index (BMI) 40.0 and over, adult: Secondary | ICD-10-CM | POA: Diagnosis not present

## 2023-04-19 DIAGNOSIS — E039 Hypothyroidism, unspecified: Secondary | ICD-10-CM | POA: Diagnosis not present

## 2023-04-19 DIAGNOSIS — R002 Palpitations: Secondary | ICD-10-CM | POA: Diagnosis not present

## 2023-04-25 DIAGNOSIS — M4722 Other spondylosis with radiculopathy, cervical region: Secondary | ICD-10-CM | POA: Diagnosis not present

## 2023-04-25 DIAGNOSIS — H04123 Dry eye syndrome of bilateral lacrimal glands: Secondary | ICD-10-CM | POA: Diagnosis not present

## 2023-04-25 DIAGNOSIS — Z961 Presence of intraocular lens: Secondary | ICD-10-CM | POA: Diagnosis not present

## 2023-04-25 DIAGNOSIS — H524 Presbyopia: Secondary | ICD-10-CM | POA: Diagnosis not present

## 2023-05-03 DIAGNOSIS — Z6841 Body Mass Index (BMI) 40.0 and over, adult: Secondary | ICD-10-CM | POA: Diagnosis not present

## 2023-05-03 DIAGNOSIS — K909 Intestinal malabsorption, unspecified: Secondary | ICD-10-CM | POA: Diagnosis not present

## 2023-05-03 DIAGNOSIS — R7303 Prediabetes: Secondary | ICD-10-CM | POA: Diagnosis not present

## 2023-05-03 DIAGNOSIS — Z79899 Other long term (current) drug therapy: Secondary | ICD-10-CM | POA: Diagnosis not present

## 2023-05-03 DIAGNOSIS — E66813 Obesity, class 3: Secondary | ICD-10-CM | POA: Diagnosis not present

## 2023-05-05 DIAGNOSIS — R7303 Prediabetes: Secondary | ICD-10-CM | POA: Diagnosis not present

## 2023-05-05 DIAGNOSIS — K909 Intestinal malabsorption, unspecified: Secondary | ICD-10-CM | POA: Diagnosis not present

## 2023-05-05 DIAGNOSIS — E66813 Obesity, class 3: Secondary | ICD-10-CM | POA: Diagnosis not present

## 2023-05-05 DIAGNOSIS — Z79899 Other long term (current) drug therapy: Secondary | ICD-10-CM | POA: Diagnosis not present

## 2023-05-17 DIAGNOSIS — Z6841 Body Mass Index (BMI) 40.0 and over, adult: Secondary | ICD-10-CM | POA: Diagnosis not present

## 2023-05-17 DIAGNOSIS — M4326 Fusion of spine, lumbar region: Secondary | ICD-10-CM | POA: Diagnosis not present

## 2023-05-17 DIAGNOSIS — M4722 Other spondylosis with radiculopathy, cervical region: Secondary | ICD-10-CM | POA: Diagnosis not present

## 2023-05-17 DIAGNOSIS — M25552 Pain in left hip: Secondary | ICD-10-CM | POA: Diagnosis not present

## 2023-05-25 DIAGNOSIS — M25552 Pain in left hip: Secondary | ICD-10-CM | POA: Diagnosis not present

## 2023-06-01 ENCOUNTER — Encounter: Payer: Self-pay | Admitting: Family Medicine

## 2023-06-02 ENCOUNTER — Other Ambulatory Visit: Payer: Self-pay | Admitting: Family Medicine

## 2023-06-02 ENCOUNTER — Encounter: Payer: Self-pay | Admitting: Family Medicine

## 2023-06-02 DIAGNOSIS — M533 Sacrococcygeal disorders, not elsewhere classified: Secondary | ICD-10-CM

## 2023-06-05 ENCOUNTER — Other Ambulatory Visit

## 2023-06-06 ENCOUNTER — Encounter: Payer: Self-pay | Admitting: Family Medicine

## 2023-06-07 ENCOUNTER — Ambulatory Visit
Admission: RE | Admit: 2023-06-07 | Discharge: 2023-06-07 | Disposition: A | Discharge status: Home / Self Care | Source: Ambulatory Visit | Attending: Family Medicine | Admitting: Family Medicine

## 2023-06-07 DIAGNOSIS — M25552 Pain in left hip: Secondary | ICD-10-CM | POA: Diagnosis not present

## 2023-06-07 DIAGNOSIS — M533 Sacrococcygeal disorders, not elsewhere classified: Secondary | ICD-10-CM

## 2023-06-09 NOTE — Progress Notes (Signed)
 Neurosurgery follow-up  Chief Complaint  Patient presents with  . Joint Pain    Left SIJ, fusion Jan 30th 2025, pain started March, does not radiate, starting to spread to the right, no aggravating factors noted, relieving factors include laying on opposite side and icing    Subjective Patient is a pleasant 68 y.o. female who presents forroutine follow-up related to l lumbar spine and sacroiliac pain.  She has a history of bilateral, most recently left SI joint fusion January 2025 in [place] .  She now complains of worsening pain in her midline low back that radiates into her left buttock and intermittently into her left thigh with numbness and tingling.  The pain radiates down to her knee, but does not past her knee.  Her pain is bothersome on a daily basis, she is utilizing hydrocodone  and Robaxin  for pain control as well as Tylenol  and pain patches without relief.  She did have a intra-articular injection of the left hip which was mildly helpful.  She denies any lower extremity weakness.  She does have aggravating symptoms with activity, gets relief with rest.  She also underwent a CT scan of her pelvis for evaluation of her SI joint hardware.  She denies any lower extremity weakness except to pain or bowel or bladder changes.  She denies any falls.  Current Outpatient Medications:  .  acetaminophen  (TYLENOL  8 HOUR ARTHRITIS PAIN) 650 mg ER caplet, Take 1 caplet (650 mg total) by mouth every 8 (eight) hours., Disp: 270 caplet, Rfl: 3 .  Acetylcysteine (N-ACETYL-L-CYSTEINE) POWD, 600 mg by Does Not Apply route daily. 1-3 tabs daily, Disp: , Rfl:  .  aspirin  81 mg EC tablet, Take 1 tablet (81 mg total) by mouth daily., Disp: , Rfl:  .  Cyanocobalamin  (B-12) 500 mcg TABS, Take 500 mcg by mouth daily., Disp: , Rfl:  .  ezetimibe  (ZETIA ) 10 mg tablet, Take 1 tablet (10 mg total) by mouth daily., Disp: , Rfl:  .  Ferrous Sulfate ER 45 MG TBCR, Take 45 mg by mouth daily., Disp: , Rfl:   .  levothyroxine  (SYNTHROID ) 137 mcg tablet, Take 137 mcg by mouth daily., Disp: , Rfl:  .  metFORMIN  (GLUCOPHAGE  XR) 500 mg 24 hr ER tablet, Take 1 tablet (500 mg total) by mouth 2 (two) times daily with meals., Disp: , Rfl:  .  methocarbamol  (ROBAXIN ) 750 mg tablet, Take 1 tablet (750 mg total) by mouth 2 (two) times daily as needed (spasms)., Disp: , Rfl:  .  PARoxetine  (PAXIL ) 20 mg tablet, Take 1 tablet (20 mg total) by mouth every morning., Disp: , Rfl:  .  pramipexole  (MIRAPEX ) 0.25 mg tablet, Take 1 tablet (0.25 mg total) by mouth 2 (two) times daily., Disp: , Rfl:  .  propranolol  (INDERAL  LA) 80 mg 24 hr ER capsule, Take 1 capsule (80 mg total) by mouth daily., Disp: , Rfl:  .  rosuvastatin  (CRESTOR ) 5 mg tablet, Take 1 tablet (5 mg total) by mouth daily., Disp: , Rfl:  .  semaglutide (OZEMPIC) 4 mg/3 mL pen, Inject 1 mg into the skin every 7 (seven) days., Disp: , Rfl:  .  vitamin D3 (CHOLECALCIFEROL ) 5,000 units tablet, Take 1 tablet (5,000 Units total) by mouth daily., Disp: , Rfl:  .  ALPRAZolam  (XANAX ) 0.5 mg tablet, Take 1 tablet (0.5 mg total) by mouth every 12 (twelve) hours as needed for anxiety., Disp: , Rfl:  .  atorvastatin  (LIPITOR) 10 mg tablet, Take 1 tablet (10  mg total) by mouth daily., Disp: , Rfl:  .  docusate sodium  (DULCOLAX) 100 mg capsule, Take 1 capsule (100 mg total) by mouth every 12 (twelve) hours as needed for constipation., Disp: , Rfl:  .  fexofenadine (ALLEGRA) 180 mg tablet, Take 1 tablet (180 mg total) by mouth as needed., Disp: , Rfl:  .  fluticasone  (FLONASE ) 50 mcg/spray nasal spray, 1 spray by Each Nare route as needed for rhinitis., Disp: , Rfl:  .  HYDROcodone -acetaminophen  (NORCO) 7.5-325 mg tablet, Take 1 tablet by mouth every 6 (six) hours as needed for moderate pain or severe pain., Disp: , Rfl: 0  History reviewed. No pertinent past medical history.  History reviewed. No pertinent surgical history.  Social History   Socioeconomic History   . Marital status: Unknown    Spouse name: Not on file  . Number of children: Not on file  . Years of education: Not on file  . Highest education level: Not on file  Occupational History  . Not on file  Tobacco Use  . Smoking status: Never  . Smokeless tobacco: Never  Substance and Sexual Activity  . Alcohol use: Never  . Drug use: Not on file  . Sexual activity: Not on file  Other Topics Concern  . Not on file  Social History Narrative  . Not on file   Social Drivers of Health   Financial Resource Strain: Not on file  Food Insecurity: Not on file  Transportation Needs: Not on file  Physical Activity: Not on file  Stress: Not on file  Social Connections: Not on file  Intimate Partner Violence: Not on file  Housing Stability: Not on file    History reviewed. No pertinent family history.  Review of Systems  Musculoskeletal:  Positive for back pain.  Neurological:  Positive for weakness.    Reviewed and updated this visit by provider: Tobacco  Allergies  Meds  Problems  Med Hx  Surg Hx  Fam Hx       Objective   The patient is awake, alert, and interactive. Their speech is fluent and cohesive. Well-groomed, well-nourished. Functional cognition intact. Dentition overall unremarkable On motor exam,  The RIGHT lower extremity is 5 out of 5 throughout with normal tone  The LEFT lowerextremity is 5 out of 5 throughout with normal tone Mild pain with internal rotation of the left hip, pain referred to the groin Moderate tenderness over the left SI joint Nontender left greater trochanteric bursa  Lumbar spine range of motion is normal without evidence of paraspinal spasm  Sensory is intact to light touch throughout  Deep tendon reflexes are normal in the upper and lower extremities No evidence of pathological reflexes Gait is stable Motor coordination intact  Radiology: CT scan of the pelvis from May 2025 independently reviewed.  There is evidence of  bilateral sacroiliac joint hardware.  There is also hardware and fusion L4-S1.  There does not appear to be any hardware lucency along the left sacroiliac joint or the right sacroiliac joint.  I do not see any significant obvious bony bridging at this point.  There is also L3 on L4 retrolisthesis with hyperlordosis and spondylosis.   Assessment/Plan 68 y.o. female with history of left sacroiliitis, with new low back pain and left lower extremity pain concerning for lumbar radiculopathy  -I recommend she undergo an MRI of the lumbar spine to rule out lumbar stenosis and radiculopathy as her symptomatology is concerning for L3/L4 radiculopathy.  She is going to  send her MRI to my office once completed for review and discussion of results.  Diagnoses and all orders for this visit:  1. Sacroiliitis -     acetaminophen  (TYLENOL  8 HOUR ARTHRITIS PAIN) 650 mg ER caplet; Take 1 caplet (650 mg total) by mouth every 8 (eight) hours.  2. Pain, radicular, lumbar -     MRI Lumbar Spine Without Contrast; Future  3. Lumbar radiculopathy    Orders Placed This Encounter  Procedures  . MRI Lumbar Spine Without Contrast    Standing Status:   Future    Expiration Date:   07/14/2024    Release to patient:   Immediate    Does Patient have a Pacemaker/Implanted Defibrillator?:   No    Does patient have cochlear(ear) implant?:   No    Does patient have Brain Aneurysm Clip?:   No    Return for Imaging Follow Up - will call patient with results after mri received .  Dawley, Lani BROCKS, DO, [facility] Neurosurgeon

## 2023-06-22 ENCOUNTER — Other Ambulatory Visit: Payer: Self-pay | Admitting: Neurological Surgery

## 2023-06-22 DIAGNOSIS — H903 Sensorineural hearing loss, bilateral: Secondary | ICD-10-CM | POA: Diagnosis not present

## 2023-06-22 DIAGNOSIS — M5416 Radiculopathy, lumbar region: Secondary | ICD-10-CM

## 2023-06-26 DIAGNOSIS — E039 Hypothyroidism, unspecified: Secondary | ICD-10-CM | POA: Diagnosis not present

## 2023-07-04 ENCOUNTER — Other Ambulatory Visit: Payer: Self-pay | Admitting: Neurological Surgery

## 2023-07-04 ENCOUNTER — Encounter: Payer: Self-pay | Admitting: Neurological Surgery

## 2023-07-08 ENCOUNTER — Ambulatory Visit
Admission: RE | Admit: 2023-07-08 | Discharge: 2023-07-08 | Disposition: A | Discharge status: Home / Self Care | Source: Ambulatory Visit | Attending: Neurological Surgery

## 2023-07-08 DIAGNOSIS — M47816 Spondylosis without myelopathy or radiculopathy, lumbar region: Secondary | ICD-10-CM | POA: Diagnosis not present

## 2023-07-08 DIAGNOSIS — M48061 Spinal stenosis, lumbar region without neurogenic claudication: Secondary | ICD-10-CM | POA: Diagnosis not present

## 2023-07-08 DIAGNOSIS — M5126 Other intervertebral disc displacement, lumbar region: Secondary | ICD-10-CM | POA: Diagnosis not present

## 2023-07-08 DIAGNOSIS — M5416 Radiculopathy, lumbar region: Secondary | ICD-10-CM

## 2023-07-26 DIAGNOSIS — M4802 Spinal stenosis, cervical region: Secondary | ICD-10-CM | POA: Diagnosis not present

## 2023-07-26 DIAGNOSIS — Z9889 Other specified postprocedural states: Secondary | ICD-10-CM | POA: Diagnosis not present

## 2023-07-26 DIAGNOSIS — M4722 Other spondylosis with radiculopathy, cervical region: Secondary | ICD-10-CM | POA: Diagnosis not present

## 2023-07-26 DIAGNOSIS — M5412 Radiculopathy, cervical region: Secondary | ICD-10-CM | POA: Diagnosis not present

## 2023-08-14 ENCOUNTER — Other Ambulatory Visit: Payer: Self-pay | Admitting: Family Medicine

## 2023-08-14 DIAGNOSIS — Z1231 Encounter for screening mammogram for malignant neoplasm of breast: Secondary | ICD-10-CM

## 2023-08-15 DIAGNOSIS — M4722 Other spondylosis with radiculopathy, cervical region: Secondary | ICD-10-CM | POA: Diagnosis not present

## 2023-08-16 ENCOUNTER — Other Ambulatory Visit: Payer: Self-pay | Admitting: Psychiatry

## 2023-08-16 DIAGNOSIS — G2581 Restless legs syndrome: Secondary | ICD-10-CM

## 2023-08-16 NOTE — Telephone Encounter (Signed)
 Pt called at 2:30p.  She is requesting Pramipexole  be sent in.  She is out of meds for tonight.  Confirmed pharmacy is [facility].  Next appt 10/23

## 2023-08-16 NOTE — Telephone Encounter (Signed)
 Past due for FU. Sent MyChart message asking her to schedule FU.

## 2023-09-12 ENCOUNTER — Ambulatory Visit
Admission: RE | Admit: 2023-09-12 | Discharge: 2023-09-12 | Disposition: A | Discharge status: Home / Self Care | Source: Ambulatory Visit | Attending: Family Medicine | Admitting: Family Medicine

## 2023-09-12 DIAGNOSIS — Z1231 Encounter for screening mammogram for malignant neoplasm of breast: Secondary | ICD-10-CM | POA: Diagnosis not present

## 2023-09-13 DIAGNOSIS — Z9884 Bariatric surgery status: Secondary | ICD-10-CM | POA: Diagnosis not present

## 2023-09-13 DIAGNOSIS — E66813 Obesity, class 3: Secondary | ICD-10-CM | POA: Diagnosis not present

## 2023-09-13 DIAGNOSIS — Z79899 Other long term (current) drug therapy: Secondary | ICD-10-CM | POA: Diagnosis not present

## 2023-09-13 DIAGNOSIS — Z6839 Body mass index (BMI) 39.0-39.9, adult: Secondary | ICD-10-CM | POA: Diagnosis not present

## 2023-09-13 DIAGNOSIS — K909 Intestinal malabsorption, unspecified: Secondary | ICD-10-CM | POA: Diagnosis not present

## 2023-10-05 DIAGNOSIS — Z23 Encounter for immunization: Secondary | ICD-10-CM | POA: Diagnosis not present

## 2023-10-05 DIAGNOSIS — E039 Hypothyroidism, unspecified: Secondary | ICD-10-CM | POA: Diagnosis not present

## 2023-10-23 ENCOUNTER — Telehealth: Payer: Self-pay | Admitting: Psychiatry

## 2023-10-23 DIAGNOSIS — F3340 Major depressive disorder, recurrent, in remission, unspecified: Secondary | ICD-10-CM

## 2023-10-23 DIAGNOSIS — F411 Generalized anxiety disorder: Secondary | ICD-10-CM

## 2023-10-23 DIAGNOSIS — F4010 Social phobia, unspecified: Secondary | ICD-10-CM

## 2023-10-23 DIAGNOSIS — F429 Obsessive-compulsive disorder, unspecified: Secondary | ICD-10-CM

## 2023-10-23 DIAGNOSIS — F341 Dysthymic disorder: Secondary | ICD-10-CM

## 2023-10-23 MED ORDER — PAROXETINE HCL 20 MG PO TABS: 20.0000 mg | ORAL_TABLET | Freq: Two times a day (BID) | ORAL | 0 refills | Status: DC

## 2023-10-23 NOTE — Telephone Encounter (Signed)
 Pt needs rf of Paxil . She picked up last in Sept an it was an emergency fill and she said her bottle says 0 rfs.   Has appt 10/23.   [facility]

## 2023-10-23 NOTE — Telephone Encounter (Signed)
 Sent!

## 2023-10-26 ENCOUNTER — Telehealth (INDEPENDENT_AMBULATORY_CARE_PROVIDER_SITE_OTHER): Admitting: Psychiatry

## 2023-10-26 ENCOUNTER — Encounter: Payer: Self-pay | Admitting: Psychiatry

## 2023-10-26 DIAGNOSIS — F4001 Agoraphobia with panic disorder: Secondary | ICD-10-CM | POA: Diagnosis not present

## 2023-10-26 DIAGNOSIS — F341 Dysthymic disorder: Secondary | ICD-10-CM | POA: Diagnosis not present

## 2023-10-26 DIAGNOSIS — F411 Generalized anxiety disorder: Secondary | ICD-10-CM

## 2023-10-26 DIAGNOSIS — F429 Obsessive-compulsive disorder, unspecified: Secondary | ICD-10-CM

## 2023-10-26 DIAGNOSIS — G2581 Restless legs syndrome: Secondary | ICD-10-CM

## 2023-10-26 DIAGNOSIS — F3340 Major depressive disorder, recurrent, in remission, unspecified: Secondary | ICD-10-CM | POA: Diagnosis not present

## 2023-10-26 DIAGNOSIS — G250 Essential tremor: Secondary | ICD-10-CM | POA: Diagnosis not present

## 2023-10-26 DIAGNOSIS — F4010 Social phobia, unspecified: Secondary | ICD-10-CM

## 2023-10-26 MED ORDER — PROPRANOLOL HCL ER 120 MG PO CP24
120.0000 mg | ORAL_CAPSULE | Freq: Every day | ORAL | 0 refills | Status: AC
Start: 2023-10-26 — End: ?

## 2023-10-26 MED ORDER — PRAMIPEXOLE DIHYDROCHLORIDE 0.25 MG PO TABS: ORAL_TABLET | ORAL | 1 refills | Status: DC

## 2023-10-26 MED ORDER — PAROXETINE HCL 20 MG PO TABS: 20.0000 mg | ORAL_TABLET | Freq: Two times a day (BID) | ORAL | 0 refills | Status: DC

## 2023-10-26 MED ORDER — ALPRAZOLAM 0.25 MG PO TABS: 0.2500 mg | ORAL_TABLET | Freq: Four times a day (QID) | ORAL | 2 refills | Status: DC | PRN

## 2023-10-26 NOTE — Progress Notes (Signed)
 [name] [id number] Jun 14, 1955 68 y.o.  Video Visit via My Chart  I connected with pt by video using My Chart and verified that I am speaking with the correct person using two identifiers.   I discussed the limitations, risks, security and privacy concerns of performing an evaluation and management service by My Chart  and the availability of in person appointments. I also discussed with the patient that there may be a patient responsible charge related to this service. The patient expressed understanding and agreed to proceed.  I discussed the assessment and treatment plan with the patient. The patient was provided an opportunity to ask questions and all were answered. The patient agreed with the plan and demonstrated an understanding of the instructions.   The patient was advised to call back or seek an in-person evaluation if the symptoms worsen or if the condition fails to improve as anticipated.  I provided 30 minutes of video time during this encounter.  The patient was located at home and the provider was located office.   Session [phone]   Subjective:   Patient ID:  [name] is a 68 y.o. (DOB Nov 01, 1955) female.  Chief Complaint:  Chief Complaint  Patient presents with   Follow-up   Anxiety   Other    tremor    HPI [name] presents for follow-up of treatment resistant OCD, major depression, and restless leg syndrome.  seen May 2019.  She had accidentally reduced her paroxetine  from 60 mg to 40 mg and it was increased back to the appropriate 60 mg dose.  She never increased the dosage.  seen May 2020.  She continued on paroxetine  40 mg a day.  Her anxiety level was high.  We discussed increasing it back to 60 mg a day which she had taken in the past.  She indicated a willingness to do so.  This had been discussed at the previous appointment as well but she never made the change.   As of Feb 2021, more anxiety.  September fusions back  surgery.  Created lots of anxiety.   More scary thoughts and more OCD.  Covid triggers obsessive fears about the end of times biblically.   Didn't increase paroxetine  bc afraid she will lose options for future fear of worsening options.  I'm saving it.   More down over the back surgery and immobility.  Depression was OK otherwise.  Been doing pretty good for the most part, but more anxious lately.  Nervous facing knee and possible back surgery.  Also working on obsessions with Dr. Marijean.  She wonders about increasing the Paxil .  She never increased the Paxil  to 60 mg daily.  Disc pros and cons of this.  Needing more Xanax  but it's not a high dosage.. Plan: increase in paroxetine  to 60 mg daily.  03/26/2020 appointment with following noted: Unusually patient has not been seen in over a year. Pretty good for the most part.  Still has triggers for OCD like yesterday memorial service but can pull herself out of it.Still has the OCD.  Easier to dismiss the obsessions and anxiety is better. Increase paroxetine  helped her.  Recognizes benefit from the meds.  Stopped counseling and feels OK about it.  Much better than a year ago. Takes Xanax  but not lately.  At least 2 weeks. Has tremor off and on and can affect handwriting.  Sisters & mother have tremor also. Chronic back pain and accepted it.  Taking propranolol  ER 60 for  anxiety.  10/05/2021 appt noted: Reduced paroxetine  to 20 BID for 6 mos or so.  Just trying to do with less.  No other reason. Anxiety is an issue but she felt it was not related to paroxeitine reduction. Shaking triggered by hyperthyroidism has made her a nervous wreck and this triggers fear of PD.  Reduced thyroid  med 4 weeks ago without resolution. Taking increased Xanax  0.25 mg BID and increased propranolol  to 80 mg daily.  08/08/22 appt noted: Doing pretty well.   Meds: Xanax  0.25 mg typically just prn, paroxetine  20 BID, pramipexole  0.25 mg in amd and 0.375 mg PM, propranolol   LA 80mg  daily. No problems with meds. Still has triggers but anxiety otherwise OCD is pretty stable.  Death can trigger salvation obsessions. Not dep at all.   Some back trouble again and fusion SI joint done Jan and probably needs other side. Back pain can interfere with sleep. DX FM and complaining of fibro fog.  10/26/23 appt noted:  Meds: Xanax  0.25 mg typically just prn, paroxetine  20 BID, pramipexole  0.25 mg in am and 0.375 mg PM, propranolol  LA 80mg  daily. Overall doing well.  Developing some px like tremor inside and tremor.  Dx hyperthyroid 2 mos ago and reduced meds but tremor not better.   Mouth and eyes and hands tremor.  Starting to be all the time once up for 10 mins.  No Caffeine.   GF tremor including head.  Started propranolol  for tremor years ago and it worked for [employer].   Not dep at all.   Feeling quite anxious with other sx including OCD worse but is handling it some.  But some racing anxious thoughts.  Sleep is not so good, erratic. 6 hours sleep.     Past Psychiatric Medication Trials:  Paroxetine , venlafaxine, clomipramine, Lexapro 40 no response, sertraline, duloxetine, fluvoxamine,   Wellbutrin 300 panic, Viibryd, Fetzima,  Abilify, Risperdal, Geodon 40 twice daily side effects, Seroquel, Latuda, Rexulti, Saphris,  N-acetylcysteine, Deplin, clonidine, pindolol, gabapentin SI, lamotrigine, buspirone,  pramipexole ,  propranolol  Xanax   Review of Systems:  Review of Systems  Cardiovascular:  Negative for palpitations.  Musculoskeletal:  Positive for back pain, gait problem and joint swelling.  Neurological:  Positive for tremors. Negative for weakness.  Psychiatric/Behavioral:  The patient is nervous/anxious.     Medications: I have reviewed the patient's current medications.  Current Outpatient Medications  Medication Sig Dispense Refill   acetaminophen  (TYLENOL ) 500 MG tablet Take 500 mg by mouth every 6 (six) hours as needed for moderate pain.      Acetylcysteine (NAC PO) Take 2 capsules by mouth daily.     aspirin  EC 81 MG tablet Take 1 tablet (81 mg total) by mouth daily. Swallow whole. 90 tablet 3   Azelaic Acid 15 % gel Apply 1 Application topically 2 (two) times daily as needed (rosacea).     B Complex Vitamins (B COMPLEX PO) Place 1 tablet under the tongue daily.     Cholecalciferol  (VITAMIN D-3) 5000 UNITS TABS Take 5,000 Units by mouth daily.     clindamycin (CLEOCIN T) 1 % external solution Apply 1 Application topically daily as needed (rosacea).     ezetimibe  (ZETIA ) 10 MG tablet Take 10 mg by mouth daily.      fexofenadine (ALLEGRA) 180 MG tablet Take 180 mg by mouth daily as needed for allergies.      fluticasone  (FLONASE ) 50 MCG/ACT nasal spray Place 1-2 sprays into both nostrils daily as needed for allergies or rhinitis.  levothyroxine  (SYNTHROID ) 137 MCG tablet Take 137 mcg by mouth daily before breakfast.     lidocaine  4 % Place 1 patch onto the skin daily as needed (pain).     methocarbamol  (ROBAXIN ) 500 MG tablet Take 1 tablet (500 mg total) by mouth every 6 (six) hours as needed for muscle spasms. 120 tablet 1   metroNIDAZOLE (METROGEL) 0.75 % gel Apply 1 Application topically 2 (two) times daily as needed (rosacea).     minocycline (MINOCIN) 100 MG capsule Take 100 mg by mouth 2 (two) times daily as needed (rosacea flare).     Multiple Vitamin (MULTIVITAMIN WITH MINERALS) TABS tablet Take 1 tablet by mouth daily.     rosuvastatin  (CRESTOR ) 5 MG tablet TAKE ONE TABLET BY MOUTH ONCE A DAY 90 tablet 3   ALPRAZolam  (XANAX ) 0.25 MG tablet Take 1 tablet (0.25 mg total) by mouth 4 (four) times daily as needed for anxiety. 120 tablet 2   amoxicillin (AMOXIL) 500 MG capsule Take 2,000 mg by mouth See admin instructions. Take 2000 mg by mouth 1 hour prior to dental procedure. (Patient not taking: Reported on 10/26/2023)     metFORMIN  (GLUCOPHAGE ) 500 MG tablet Take 500 mg by mouth 2 (two) times daily with a meal. (Patient not  taking: Reported on 10/26/2023)     oxyCODONE  (OXY IR/ROXICODONE ) 5 MG immediate release tablet Take 1 tablet (5 mg total) by mouth every 4 (four) hours as needed for moderate pain (pain score 4-6). (Patient not taking: Reported on 10/26/2023) 30 tablet 0   PARoxetine  (PAXIL ) 20 MG tablet Take 1 tablet (20 mg total) by mouth 2 (two) times daily. 180 tablet 0   pramipexole  (MIRAPEX ) 0.25 MG tablet TAKE ONE TABLET BY MOUTH IN THE MORNING AND ONE AND ONE HALF (1/2) TABLET BY MOUTH AT NIGHT. 150 tablet 1   propranolol  ER (INDERAL  LA) 120 MG 24 hr capsule Take 1 capsule (120 mg total) by mouth daily. 90 capsule 0   No current facility-administered medications for this visit.    Medication Side Effects: None  Allergies:  Allergies  Allergen Reactions   Morphine And Codeine Other (See Comments)    Feels like bugs crawling over her   Nsaids Hives   Tape Other (See Comments)    Blisters with tape after surgical procedures   Tapentadol Other (See Comments)    Blisters with tape after surgical procedures   Buspar [Buspirone] Palpitations   Ciprofloxacin Nausea Only    Upset stomach    Codeine Other (See Comments)    Hyper   Gabapentin Other (See Comments)    Suicidal ideation.   Levothyroxine      Other reaction(s): hot flashes,    Lodine [Etodolac] Nausea Only    Upset Stomach    Lyrica [Pregabalin] Other (See Comments)    suicidal ideation   Morphine Sulfate Hives   Naproxen Sodium    Niacin And Related Other (See Comments)    flushing   Nitrofurantoin Hives   Pravastatin Other (See Comments)    achiness    Septra [Sulfamethoxazole-Trimethoprim] Other (See Comments)    Stomach pains   Tramadol     Other reaction(s): Pt reports drug interaction with Paxil    Tylox [Oxycodone -Acetaminophen ] Other (See Comments)    Hyper   Tyloxapol     Other reaction(s): hyper   Wellbutrin [Bupropion] Other (See Comments)    dizzy    Past Medical History:  Diagnosis Date   Anginal pain     on and off (  11/01/2011) - anxiety related   Anxiety    takes Paxil  daily and Xanax  prn   Arthritis    knees (10/16/2013)   Chronic back pain    facet disease and bulging disc; thoracic and lower back (10/16/2013)   Constipation    r/t pain meds;takes stool softener daily   COVID 01/2022   Dysrhythmia    rapid HR on occasion-takes Propranolol  daily   Family history of anesthesia complication    :PONV; mom and sisters   History of bladder infections    > 40yr ago   History of kidney stones    Hyperlipidemia    takes LIpitor daily   Hypothyroidism    takes Synthroid  daily   Insomnia    Joint pain    Joint swelling    Muscle spasms of head or neck    lumbar and thoracic;takes Robaxin  prn   Peripheral neuropathy    PONV (postoperative nausea and vomiting)    Rosacea    Seasonal allergies    takes allegra prn   Sleep apnea     Family History  Problem Relation Age of Onset   Heart disease Mother 29   Heart failure Mother    Heart attack Father    Heart disease Father 78   Breast cancer Sister 63       recur @ age 43 (metastisized)    Social History   Socioeconomic History   Marital status: Married    Spouse name: Not on file   Number of children: Not on file   Years of education: Not on file   Highest education level: Not on file  Occupational History   Not on file  Tobacco Use   Smoking status: Never   Smokeless tobacco: Never  Vaping Use   Vaping status: Never Used  Substance and Sexual Activity   Alcohol use: No   Drug use: No   Sexual activity: Yes    Birth control/protection: Surgical  Other Topics Concern   Not on file  Social History Narrative   Not on file   Social Drivers of Health   Financial Resource Strain: Not on file  Food Insecurity: Not on file  Transportation Needs: Not on file  Physical Activity: Not on file  Stress: Not on file  Social Connections: Not on file  Intimate Partner Violence: Not on file    Past Medical  History, Surgical history, Social history, and Family history were reviewed and updated as appropriate.   Please see review of systems for further details on the patient's review from today.   Objective:   Physical Exam:  There were no vitals taken for this visit.  Physical Exam Neurological:     Mental Status: She is alert and oriented to person, place, and time.     Cranial Nerves: No dysarthria.     Motor: Tremor present.     Comments: Fine vertical tremor both hands.  Psychiatric:        Attention and Perception: Attention and perception normal.        Mood and Affect: Mood is anxious. Mood is not depressed.        Speech: Speech normal.        Behavior: Behavior is cooperative.        Thought Content: Thought content normal. Thought content is not paranoid or delusional. Thought content does not include homicidal or suicidal ideation. Thought content does not include suicidal plan.        Cognition and  Memory: Cognition and memory normal.        Judgment: Judgment normal.     Comments: Insight intact     Lab Review:     Component Value Date/Time   NA 141 01/27/2023 1000   NA 141 06/15/2018 1411   K 4.0 01/27/2023 1000   CL 105 01/27/2023 1000   CO2 26 01/27/2023 1000   GLUCOSE 104 (H) 01/27/2023 1000   BUN 24 (H) 01/27/2023 1000   BUN 13 06/15/2018 1411   CREATININE 0.81 01/27/2023 1000   CALCIUM  9.5 01/27/2023 1000   PROT 6.5 10/06/2021 0852   ALBUMIN  3.9 10/06/2021 0852   AST 20 10/06/2021 0852   ALT 21 10/06/2021 0852   ALKPHOS 61 10/06/2021 0852   BILITOT 0.4 10/06/2021 0852   GFRNONAA >60 01/27/2023 1000   GFRAA >60 10/16/2018 0558       Component Value Date/Time   WBC 7.3 01/27/2023 1000   RBC 3.88 01/27/2023 1000   HGB 12.5 01/27/2023 1000   HCT 38.0 01/27/2023 1000   PLT 327 01/27/2023 1000   MCV 97.9 01/27/2023 1000   MCH 32.2 01/27/2023 1000   MCHC 32.9 01/27/2023 1000   RDW 13.6 01/27/2023 1000   LYMPHSABS 2,217 04/21/2020 1643   MONOABS  0.5 10/08/2013 1050   EOSABS 129 04/21/2020 1643   BASOSABS 48 04/21/2020 1643    No results found for: POCLITH, LITHIUM   No results found for: PHENYTOIN, PHENOBARB, VALPROATE, CBMZ   .res Assessment: Plan:    Generalized anxiety disorder - Plan: ALPRAZolam  (XANAX ) 0.25 MG tablet, PARoxetine  (PAXIL ) 20 MG tablet  Obsessive-compulsive disorder - scrupulosity type - Plan: PARoxetine  (PAXIL ) 20 MG tablet  Social anxiety disorder - Plan: PARoxetine  (PAXIL ) 20 MG tablet  Recurrent major depressive disorder, in remission - Plan: PARoxetine  (PAXIL ) 20 MG tablet  Restless legs syndrome - Plan: pramipexole  (MIRAPEX ) 0.25 MG tablet  Panic disorder with agoraphobia  Early onset dysthymia - Plan: PARoxetine  (PAXIL ) 20 MG tablet  Essential tremor - Plan: propranolol  ER (INDERAL  LA) 120 MG 24 hr capsule   30 min of video face to face time with patient was spent on counseling and coordination of care. We discussed She overall has been satisfied with the meds in spite of being on a relatively low dose of paroxetine  40 mg given her history of treatment resistant OCD.  She  made progress in therapy that is helping.    Overall OCD is manageable but not gone.  Disc CBT and she's aware.     Can temporarily increase Xanax  0.25 mg TID - QID until hyperthyroidism resolved.  Propranolol  is also helping her anxiety and she's satisfied but wants to increase for her tremor. After one week then increase propranolol  to LA 120 mg daily. Check BP and pulse.  Call if low.  Disc SE.  Disc alternative like primido  Her restless legs is managed as long as she takes the pramipexole .   continue paroxetine  40 mg daily. Option increase if needed. Pt historically  needs high dosage bc of TR OCD.  But done ok with 40 mg daily.   Reduce paroxetine  to 30 mg for 1 week and let us  know if tremor is better or not and then increase back to 40 mg daily.   Rec trial (NAC) N-Acetylcysteine 2 of the  600 mg  capsules daily to help with mild cognitive problems.  It can be combined with a B-complex vitamin as the B-12 and folate which can sometimes enhance the  effect.  Follow-up 2 mos in office  Lorene Macintosh, MD, DFAPA.  Future Appointments  Date Time Provider Department Center  11/06/2023  8:00 AM Jeffrie Oneil BROCKS, MD [facility]  11/08/2023  7:35 AM [facility]    No orders of the defined types were placed in this encounter.     -------------------------------

## 2023-10-30 DIAGNOSIS — G8929 Other chronic pain: Secondary | ICD-10-CM | POA: Diagnosis not present

## 2023-10-30 DIAGNOSIS — J301 Allergic rhinitis due to pollen: Secondary | ICD-10-CM | POA: Diagnosis not present

## 2023-10-30 DIAGNOSIS — E7800 Pure hypercholesterolemia, unspecified: Secondary | ICD-10-CM | POA: Diagnosis not present

## 2023-10-30 DIAGNOSIS — Z0000 Encounter for general adult medical examination without abnormal findings: Secondary | ICD-10-CM | POA: Diagnosis not present

## 2023-10-30 DIAGNOSIS — R002 Palpitations: Secondary | ICD-10-CM | POA: Diagnosis not present

## 2023-10-30 DIAGNOSIS — R7303 Prediabetes: Secondary | ICD-10-CM | POA: Diagnosis not present

## 2023-10-30 DIAGNOSIS — E039 Hypothyroidism, unspecified: Secondary | ICD-10-CM | POA: Diagnosis not present

## 2023-10-30 DIAGNOSIS — M797 Fibromyalgia: Secondary | ICD-10-CM | POA: Diagnosis not present

## 2023-10-30 DIAGNOSIS — R109 Unspecified abdominal pain: Secondary | ICD-10-CM | POA: Diagnosis not present

## 2023-10-31 ENCOUNTER — Other Ambulatory Visit (HOSPITAL_BASED_OUTPATIENT_CLINIC_OR_DEPARTMENT_OTHER): Payer: Self-pay | Admitting: Family Medicine

## 2023-10-31 DIAGNOSIS — E2839 Other primary ovarian failure: Secondary | ICD-10-CM

## 2023-11-06 ENCOUNTER — Ambulatory Visit: Attending: Cardiology | Admitting: Cardiology

## 2023-11-06 ENCOUNTER — Other Ambulatory Visit: Payer: Self-pay | Admitting: Family Medicine

## 2023-11-06 ENCOUNTER — Encounter: Payer: Self-pay | Admitting: Cardiology

## 2023-11-06 VITALS — BP 129/71 | HR 74 | Ht 65.0 in | Wt 238.0 lb

## 2023-11-06 DIAGNOSIS — I700 Atherosclerosis of aorta: Secondary | ICD-10-CM

## 2023-11-06 DIAGNOSIS — I4510 Unspecified right bundle-branch block: Secondary | ICD-10-CM | POA: Diagnosis not present

## 2023-11-06 DIAGNOSIS — I10 Essential (primary) hypertension: Secondary | ICD-10-CM

## 2023-11-06 DIAGNOSIS — R1031 Right lower quadrant pain: Secondary | ICD-10-CM

## 2023-11-06 DIAGNOSIS — E7800 Pure hypercholesterolemia, unspecified: Secondary | ICD-10-CM

## 2023-11-06 NOTE — Progress Notes (Signed)
 Cardiology Office Note:  .   Date:  11/06/2023  ID:  Rock Oneita Metro, DOB Feb 14, 1955, MRN [id number] PCP: Arloa Elsie SAUNDERS, MD  [facility] Providers Cardiologist:  Victory LELON Claudene DOUGLAS, MD (Inactive)    History of Present Illness: .   Kaitlen Redford is a 68 y.o. female Discussed the use of AI scribe software History of Present Illness Sevin Farone is a 68 year old female with right bundle branch block and aortic atherosclerosis who presents for cardiovascular follow-up. She was referred by [facility] for ongoing cardiovascular care.  She has a history of right bundle branch block identified on a previous EKG.  She is on a statin and Zetia  for aortic atherosclerosis. An echocardiogram from November 11, 2022, showed normal pump function with mild mitral valve leakage and calcification of the valve annulus. Another echocardiogram is scheduled soon. A CT scan in 2020 showed a calcium  score of zero and mild plaque in the right coronary artery. No new symptoms are reported, and she is on a low-dose aspirin  regimen.  She experiences pain, previously evaluated with a CT scan that showed no severe blockage.  Her family history includes heart disease; her mother died of congestive heart failure, and her father had a heart attack at 7. He was a heavy smoker, but she has never smoked.  She is on propranolol  for anxiety disorder but reports that her tremors have returned, suggesting it or her Paxil  may not be effective.  She is on metformin  and Ozempic for weight loss, not for diabetes, as her A1c is 5.5. Her LDL is 62, and creatinine is 0.8.  No CP, no SOB  Studies Reviewed: SABRA   EKG Interpretation Date/Time:  Monday November 06 2023 07:56:12 EST Ventricular Rate:  74 PR Interval:  152 QRS Duration:  136 QT Interval:  430 QTC Calculation: 477 R Axis:   -44  Text Interpretation: Normal sinus rhythm Left axis deviation Right bundle branch block When compared with ECG of  14-Oct-2022 15:55, Nonspecific T wave abnormality has replaced inverted T waves in Inferior leads Confirmed by Jeffrie Anes ([id number]) on 11/06/2023 8:01:29 AM    Results LABS A1c: 5.5 LDL: 62 Creatinine: 0.8  RADIOLOGY Cardiac CT: Calcium  score of 0, minimal plaque in right coronary artery (2020)  DIAGNOSTIC EKG: Right bundle branch block (11/06/2023) Echocardiogram: Normal ejection fraction, mild mitral regurgitation, calcification of mitral annulus (11/11/2022) Risk Assessment/Calculations:            Physical Exam:   VS:  BP 129/71   Pulse 74   Ht 5' 5 (1.651 m)   Wt 238 lb (108 kg)   SpO2 97%   BMI 39.61 kg/m    Wt Readings from Last 3 Encounters:  11/06/23 238 lb (108 kg)  02/02/23 256 lb (116.1 kg)  01/27/23 256 lb (116.1 kg)    GEN: Well nourished, well developed in no acute distress NECK: No JVD; No carotid bruits CARDIAC: RRR, no murmurs, no rubs, no gallops RESPIRATORY:  Clear to auscultation without rales, wheezing or rhonchi  ABDOMEN: Soft, non-tender, non-distended EXTREMITIES:  No edema; No deformity   ASSESSMENT AND PLAN: .    Assessment and Plan Assessment & Plan Aortic atherosclerosis and hyperlipidemia Aortic atherosclerosis with calcification and plaque in the aorta. Hyperlipidemia managed with statin and Zetia . Recent labs show LDL at 62, indicating good control. No new symptoms warranting further imaging. - Continue statin and Zetia  therapy. - Maintain current lifestyle modifications, including a Mediterranean diet.  Mitral valve regurgitation Mild mitral valve regurgitation with calcification of the valve annulus. Previous echocardiogram showed normal pump function with mild valve leakage. No significant progression expected. - Await results of upcoming echocardiogram to assess current status of mitral valve regurgitation.  Right bundle branch block Chronic right bundle branch block with no symptoms warranting intervention. EKG shows persistent  right bundle branch block. Rarely requires pacemaker unless symptomatic.  Essential hypertension Blood pressure well-controlled at 29/71 mmHg. Current management appears effective. - Continue current antihypertensive regimen.  Generalized anxiety disorder Managed with propranolol  and Paxil . Recent increase in tremors suggests possible need for adjustment in medication. Blood pressure is stable, allowing for potential increase in propranolol  dosage. - Increased propranolol  dosage to 120 mg as per primary care physician's recommendation.         Dispo: 1 yr APP  Signed, Oneil Parchment, MD

## 2023-11-06 NOTE — Patient Instructions (Signed)
 Medication Instructions:  The current medical regimen is effective;  continue present plan and medications.  *If you need a refill on your cardiac medications before your next appointment, please call your pharmacy*  Follow-Up: At [facility], you and your health needs are our priority.  As part of our continuing mission to provide you with exceptional heart care, our providers are all part of one team.  This team includes your primary Cardiologist (physician) and Advanced Practice Providers or APPs (Physician Assistants and Nurse Practitioners) who all work together to provide you with the care you need, when you need it.  Your next appointment:   1 year(s)  Provider:   One of our Advanced Practice Providers (APPs): Morse Clause, PA-C  Lamarr Satterfield, NP Miriam Shams, NP  Olivia Pavy, PA-C Josefa Beauvais, NP  Leontine Salen, PA-C Orren Fabry, PA-C  [place], PA-C Ernest Dick, NP  Damien Braver, NP Jon Hails, PA-C  Waddell Donath, PA-C    Dayna Dunn, PA-C  Scott Weaver, PA-C Lum Louis, NP Katlyn West, NP Callie Goodrich, PA-C  Xika Zhao, NP Sheng Haley, PA-C    Kathleen Johnson, PA-C       We recommend signing up for the patient portal called MyChart.  Sign up information is provided on this After Visit Summary.  MyChart is used to connect with patients for Virtual Visits (Telemedicine).  Patients are able to view lab/test results, encounter notes, upcoming appointments, etc.  Non-urgent messages can be sent to your provider as well.   To learn more about what you can do with MyChart, go to [email or web address].

## 2023-11-07 ENCOUNTER — Telehealth: Payer: Self-pay

## 2023-11-07 NOTE — Telephone Encounter (Signed)
 Sent to Dr. Jennelle Human via [facility]

## 2023-11-07 NOTE — Telephone Encounter (Signed)
 From MyChart:  Hi Dr. Geoffry.  Just to let you know that my tremor is somewhat better than it was on 10/26/23.  The internal feeling of  my shaking insides is about 75% gone and the eye twitching, mouth tremors and hand tremors are about 45% reduced with lower amount of Paxil .  Dr. Arloa wanted me to tell you that he has lowered my thyroid  medication  a small amount also as the T4 results were still showing signs of hyper thyroid  instead of hypo thyroid .  He will check again in 8 weeks.  I am a little nervous about taking the increased dose of propranolol  ER 120 b/c of possible very low pulse.  Is there another med I can try or do I need to stick with the propranolol ?   Thank you so much for helping me.   Rock Metro

## 2023-11-08 ENCOUNTER — Ambulatory Visit (HOSPITAL_COMMUNITY)
Admission: RE | Admit: 2023-11-08 | Discharge: 2023-11-08 | Disposition: A | Discharge status: Home / Self Care | Source: Ambulatory Visit | Attending: Cardiology | Admitting: Cardiology

## 2023-11-08 ENCOUNTER — Inpatient Hospital Stay
Admission: RE | Admit: 2023-11-08 | Discharge: 2023-11-08 | Disposition: A | Source: Ambulatory Visit | Attending: Family Medicine | Admitting: Family Medicine

## 2023-11-08 ENCOUNTER — Ambulatory Visit (HOSPITAL_COMMUNITY): Payer: Self-pay | Admitting: Nurse Practitioner

## 2023-11-08 DIAGNOSIS — R1031 Right lower quadrant pain: Secondary | ICD-10-CM

## 2023-11-08 DIAGNOSIS — I10 Essential (primary) hypertension: Secondary | ICD-10-CM

## 2023-11-08 DIAGNOSIS — I452 Bifascicular block: Secondary | ICD-10-CM

## 2023-11-08 DIAGNOSIS — I700 Atherosclerosis of aorta: Secondary | ICD-10-CM | POA: Diagnosis not present

## 2023-11-08 DIAGNOSIS — E7800 Pure hypercholesterolemia, unspecified: Secondary | ICD-10-CM | POA: Diagnosis not present

## 2023-11-08 DIAGNOSIS — K5730 Diverticulosis of large intestine without perforation or abscess without bleeding: Secondary | ICD-10-CM | POA: Diagnosis not present

## 2023-11-08 LAB — ECHOCARDIOGRAM COMPLETE
Area-P 1/2: 2.93 cm2
S' Lateral: 3.52 cm

## 2023-11-08 MED ORDER — IOPAMIDOL (ISOVUE-370) INJECTION 76%
75.0000 mL | Freq: Once | INTRAVENOUS | Status: AC | PRN
  Administered 2023-11-08: 75 mL via INTRAVENOUS

## 2023-11-08 NOTE — Telephone Encounter (Signed)
 Pt called to give an update to the issues she's been having with the Paxil  and Propranolol .  Next appt 12/23

## 2023-11-08 NOTE — Telephone Encounter (Signed)
 Patient left message to update on Paxil  and propranolol . Reviewed recommendations from Dr. Geoffry with her. She is going to increase Paxil  back to 40 mg and will start 120 mg propranolol  tomorrow. Asked her to FU in a couple of days.

## 2023-11-08 NOTE — Telephone Encounter (Signed)
 Pt requesting a c/b to discuss results.

## 2023-11-08 NOTE — Telephone Encounter (Signed)
 We reduced paroxetine  from 40 mg daily to 30 mg daily briefly to see if tremor was better or not.  However it is very difficult to discern the degree of effect on tremor when she is apparently still hyper thyroid  and is reducing that med too.  It is very risky to leave the paroxetine  at 30 mg bc anxiety and OCD will gradually get worse if you stay at that dose.  If pulse is below about 62 don't increase propranolol .  The best option is to increase paroxetine  back to 40 mg and wait and see how much tremor improvement you get with the reduction in thyroid  med.    Lorene Macintosh, MD, DFAPA

## 2023-11-09 NOTE — Telephone Encounter (Signed)
 Spoke with pt regarding test results. Pt would like more clarification on her study regarding  3. The mitral valve is normal in structure. Mild mitral valve regurgitation. No evidence of mitral stenosis. Severe mitral annular calcification.   Pt stated her previous study read as mild and now shows severe. Please advise

## 2023-11-16 DIAGNOSIS — R109 Unspecified abdominal pain: Secondary | ICD-10-CM | POA: Diagnosis not present

## 2023-11-16 DIAGNOSIS — R935 Abnormal findings on diagnostic imaging of other abdominal regions, including retroperitoneum: Secondary | ICD-10-CM | POA: Diagnosis not present

## 2023-11-16 DIAGNOSIS — K5900 Constipation, unspecified: Secondary | ICD-10-CM | POA: Diagnosis not present

## 2023-11-17 DIAGNOSIS — M47812 Spondylosis without myelopathy or radiculopathy, cervical region: Secondary | ICD-10-CM | POA: Diagnosis not present

## 2023-11-17 DIAGNOSIS — M4722 Other spondylosis with radiculopathy, cervical region: Secondary | ICD-10-CM | POA: Diagnosis not present

## 2023-11-17 DIAGNOSIS — M48061 Spinal stenosis, lumbar region without neurogenic claudication: Secondary | ICD-10-CM | POA: Diagnosis not present

## 2023-11-23 DIAGNOSIS — R7303 Prediabetes: Secondary | ICD-10-CM | POA: Diagnosis not present

## 2023-11-23 DIAGNOSIS — E66813 Obesity, class 3: Secondary | ICD-10-CM | POA: Diagnosis not present

## 2023-11-23 DIAGNOSIS — Z9884 Bariatric surgery status: Secondary | ICD-10-CM | POA: Diagnosis not present

## 2023-11-23 DIAGNOSIS — Z79899 Other long term (current) drug therapy: Secondary | ICD-10-CM | POA: Diagnosis not present

## 2023-12-06 DIAGNOSIS — Z9884 Bariatric surgery status: Secondary | ICD-10-CM | POA: Diagnosis not present

## 2023-12-06 DIAGNOSIS — K3189 Other diseases of stomach and duodenum: Secondary | ICD-10-CM | POA: Diagnosis not present

## 2023-12-06 DIAGNOSIS — R933 Abnormal findings on diagnostic imaging of other parts of digestive tract: Secondary | ICD-10-CM | POA: Diagnosis not present

## 2023-12-06 DIAGNOSIS — K319 Disease of stomach and duodenum, unspecified: Secondary | ICD-10-CM | POA: Diagnosis not present

## 2023-12-06 DIAGNOSIS — R109 Unspecified abdominal pain: Secondary | ICD-10-CM | POA: Diagnosis not present

## 2023-12-06 DIAGNOSIS — K2970 Gastritis, unspecified, without bleeding: Secondary | ICD-10-CM | POA: Diagnosis not present

## 2023-12-26 ENCOUNTER — Ambulatory Visit: Admitting: Psychiatry

## 2024-01-15 ENCOUNTER — Encounter: Payer: Self-pay | Admitting: Psychiatry

## 2024-01-15 ENCOUNTER — Ambulatory Visit: Admitting: Psychiatry

## 2024-01-15 DIAGNOSIS — F3340 Major depressive disorder, recurrent, in remission, unspecified: Secondary | ICD-10-CM | POA: Diagnosis not present

## 2024-01-15 DIAGNOSIS — G2581 Restless legs syndrome: Secondary | ICD-10-CM | POA: Diagnosis not present

## 2024-01-15 DIAGNOSIS — F4010 Social phobia, unspecified: Secondary | ICD-10-CM | POA: Diagnosis not present

## 2024-01-15 DIAGNOSIS — F4001 Agoraphobia with panic disorder: Secondary | ICD-10-CM

## 2024-01-15 DIAGNOSIS — F411 Generalized anxiety disorder: Secondary | ICD-10-CM | POA: Diagnosis not present

## 2024-01-15 DIAGNOSIS — G250 Essential tremor: Secondary | ICD-10-CM

## 2024-01-15 DIAGNOSIS — F341 Dysthymic disorder: Secondary | ICD-10-CM

## 2024-01-15 DIAGNOSIS — F429 Obsessive-compulsive disorder, unspecified: Secondary | ICD-10-CM

## 2024-01-15 MED ORDER — ALPRAZOLAM 0.25 MG PO TABS: 0.2500 mg | ORAL_TABLET | Freq: Four times a day (QID) | ORAL | 2 refills | Status: AC | PRN

## 2024-01-15 MED ORDER — PRAMIPEXOLE DIHYDROCHLORIDE 0.25 MG PO TABS: ORAL_TABLET | ORAL | 1 refills | Status: AC

## 2024-01-15 MED ORDER — PAROXETINE HCL 20 MG PO TABS: ORAL_TABLET | ORAL | 1 refills | Status: AC

## 2024-01-15 NOTE — Progress Notes (Signed)
 [name] [id number] [birth year] 69 y.o.    Subjective:   Patient ID:  [name] is a 69 y.o. (DOB 1955-08-04) female.  Chief Complaint:  Chief Complaint  Patient presents with   Follow-up   Anxiety   Depression   Stress    HPI [name] presents for follow-up of treatment resistant OCD, major depression, and restless leg syndrome.  seen May 2019.  She had accidentally reduced her paroxetine  from 60 mg to 40 mg and it was increased back to the appropriate 60 mg dose.  She never increased the dosage.  seen May 2020.  She continued on paroxetine  40 mg a day.  Her anxiety level was high.  We discussed increasing it back to 60 mg a day which she had taken in the past.  She indicated a willingness to do so.  This had been discussed at the previous appointment as well but she never made the change.   As of Feb 2021, more anxiety.  September fusions back surgery.  Created lots of anxiety.   More scary thoughts and more OCD.  Covid triggers obsessive fears about the end of times biblically.   Didn't increase paroxetine  bc afraid she will lose options for future fear of worsening options.  I'm saving it.   More down over the back surgery and immobility.  Depression was OK otherwise.  Been doing pretty good for the most part, but more anxious lately.  Nervous facing knee and possible back surgery.  Also working on obsessions with Dr. Marijean.  She wonders about increasing the Paxil .  She never increased the Paxil  to 60 mg daily.  Disc pros and cons of this.  Needing more Xanax  but it's not a high dosage.. Plan: increase in paroxetine  to 60 mg daily.  03/26/2020 appointment with following noted: Unusually patient has not been seen in over a year. Pretty good for the most part.  Still has triggers for OCD like yesterday memorial service but can pull herself out of it.Still has the OCD.  Easier to dismiss the obsessions and anxiety is better. Increase paroxetine   helped her.  Recognizes benefit from the meds.  Stopped counseling and feels OK about it.  Much better than a year ago. Takes Xanax  but not lately.  At least 2 weeks. Has tremor off and on and can affect handwriting.  Sisters & mother have tremor also. Chronic back pain and accepted it.  Taking propranolol  ER 60 for anxiety.  10/05/2021 appt noted: Reduced paroxetine  to 20 BID for 6 mos or so.  Just trying to do with less.  No other reason. Anxiety is an issue but she felt it was not related to paroxeitine reduction. Shaking triggered by hyperthyroidism has made her a nervous wreck and this triggers fear of PD.  Reduced thyroid  med 4 weeks ago without resolution. Taking increased Xanax  0.25 mg BID and increased propranolol  to 80 mg daily.  08/08/22 appt noted: Doing pretty well.   Meds: Xanax  0.25 mg typically just prn, paroxetine  20 BID, pramipexole  0.25 mg in amd and 0.375 mg PM, propranolol  LA 80mg  daily. No problems with meds. Still has triggers but anxiety otherwise OCD is pretty stable.  Death can trigger salvation obsessions. Not dep at all.   Some back trouble again and fusion SI joint done Jan and probably needs other side. Back pain can interfere with sleep. DX FM and complaining of fibro fog.  10/26/23 appt noted:  Meds: Xanax  0.25 mg typically just prn,  paroxetine  20 BID, pramipexole  0.25 mg in am and 0.375 mg PM, propranolol  LA 80mg  daily. Overall doing well.  Developing some px like tremor inside and tremor.  Dx hyperthyroid 2 mos ago and reduced meds but tremor not better.   Mouth and eyes and hands tremor.  Starting to be all the time once up for 10 mins.  No Caffeine.   GF tremor including head.  Started propranolol  for tremor years ago and it worked for [employer].   Not dep at all.   Feeling quite anxious with other sx including OCD worse but is handling it some.  But some racing anxious thoughts.  Sleep is not so good, erratic. 6 hours sleep.   Plan: Reduce paroxetine  to  30 mg for 1 week and let us  know if tremor is better or not and then increase back to 40 mg daily. Increase Propranolol  LA 120 mg daily  11/03/23 TC:    11/03/23  1:38 PM Hi Dr. Geoffry.  Just to let you know that my tremor is somewhat better than it was on 10/26/23.  The internal feeling of  my shaking insides is about 75% gone and the eye twitching, mouth tremors and hand tremors are about 45% reduced with lower amount of Paxil .  Dr. Arloa wanted me to tell you that he has lowered my thyroid  medication  a small amount also as the T4 results were still showing signs of hyper thyroid  instead of hypo thyroid .  He will check again in 8 weeks.  I am a little nervous about taking the increased dose of propranolol  ER 120 b/c of possible very low pulse.  Is there another med I can try or do I need to stick with the propranolol ?   Thank you so much for helping me.  [name] [birth year]  01/15/24 appt noted:  Meds: Xanax  0.25 mg typically just prn, paroxetine  20 BID, pramipexole  0.25 mg in am and 0.375 mg PM, propranolol  LA 80mg  daily. Felt worse with reduced paroxetine  re: obsessiveness.  Minimal change in tremor.  Seems like to paroxetine  is not working as well as it did before the reduction in dose.  H notices.   Still a tremor.  Went to [employer] with family and shaking went away and returned when she started back home. No caffeine.  Didn't tolerate increase propranolol  120 mg after 4 days bc too tired. Current tremor not too bad, mostly positional.  Can't sing in choir bc hands shake too much.   Not sig dep.  No SI When tremor is bad, mouth and eyes will twitch.  Past Psychiatric Medication Trials:  Paroxetine , venlafaxine, clomipramine, Lexapro 40 no response, sertraline, duloxetine, fluvoxamine,   Wellbutrin 300 panic, Viibryd, Fetzima,  Abilify, Risperdal, Geodon 40 twice daily side effects, Seroquel, Latuda, Rexulti, Saphris,  N-acetylcysteine, Deplin, clonidine, pindolol, gabapentin SI,  lamotrigine, buspirone,  pramipexole ,  propranolol  Xanax   Review of Systems:  Review of Systems  Cardiovascular:  Negative for palpitations.  Musculoskeletal:  Positive for back pain, gait problem and joint swelling.  Neurological:  Positive for tremors. Negative for weakness.  Psychiatric/Behavioral:  The patient is nervous/anxious.     Medications: I have reviewed the patient's current medications.  Current Outpatient Medications  Medication Sig Dispense Refill   acetaminophen  (TYLENOL ) 500 MG tablet Take 500 mg by mouth every 6 (six) hours as needed for moderate pain.     Acetylcysteine (NAC PO) Take 2 capsules by mouth daily.     aspirin  EC 81 MG tablet  Take 1 tablet (81 mg total) by mouth daily. Swallow whole. 90 tablet 3   Azelaic Acid 15 % gel Apply 1 Application topically 2 (two) times daily as needed (rosacea).     B Complex Vitamins (B COMPLEX PO) Place 1 tablet under the tongue daily.     Cholecalciferol  (VITAMIN D-3) 5000 UNITS TABS Take 5,000 Units by mouth daily.     clindamycin (CLEOCIN T) 1 % external solution Apply 1 Application topically daily as needed (rosacea).     ezetimibe  (ZETIA ) 10 MG tablet Take 10 mg by mouth daily.      fexofenadine (ALLEGRA) 180 MG tablet Take 180 mg by mouth daily as needed for allergies.      fluticasone  (FLONASE ) 50 MCG/ACT nasal spray Place 1-2 sprays into both nostrils daily as needed for allergies or rhinitis.     levothyroxine  (SYNTHROID ) 137 MCG tablet Take 137 mcg by mouth daily before breakfast.     lidocaine  4 % Place 1 patch onto the skin daily as needed (pain).     methocarbamol  (ROBAXIN ) 500 MG tablet Take 1 tablet (500 mg total) by mouth every 6 (six) hours as needed for muscle spasms. 120 tablet 1   metroNIDAZOLE (METROGEL) 0.75 % gel Apply 1 Application topically 2 (two) times daily as needed (rosacea).     minocycline (MINOCIN) 100 MG capsule Take 100 mg by mouth 2 (two) times daily as needed (rosacea flare).     Multiple  Vitamin (MULTIVITAMIN WITH MINERALS) TABS tablet Take 1 tablet by mouth daily.     propranolol  ER (INDERAL  LA) 120 MG 24 hr capsule Take 1 capsule (120 mg total) by mouth daily. (Patient taking differently: Take 80 mg by mouth daily.) 90 capsule 0   rosuvastatin  (CRESTOR ) 5 MG tablet TAKE ONE TABLET BY MOUTH ONCE A DAY 90 tablet 3   ALPRAZolam  (XANAX ) 0.25 MG tablet Take 1 tablet (0.25 mg total) by mouth 4 (four) times daily as needed for anxiety. 120 tablet 2   metFORMIN  (GLUCOPHAGE ) 500 MG tablet Take 500 mg by mouth 2 (two) times daily with a meal.     PARoxetine  (PAXIL ) 20 MG tablet 1 tablet in the AM and 2 tablets at night 270 tablet 1   pramipexole  (MIRAPEX ) 0.25 MG tablet TAKE ONE TABLET BY MOUTH IN THE MORNING AND ONE AND ONE HALF (1/2) TABLET BY MOUTH AT NIGHT. 150 tablet 1   No current facility-administered medications for this visit.    Medication Side Effects: None  Allergies:  Allergies  Allergen Reactions   Morphine And Codeine Other (See Comments)    Feels like bugs crawling over her   Nsaids Hives   Tape Other (See Comments)    Blisters with tape after surgical procedures   Tapentadol Other (See Comments)    Blisters with tape after surgical procedures   Buspar [Buspirone] Palpitations   Ciprofloxacin Nausea Only    Upset stomach    Codeine Other (See Comments)    Hyper   Gabapentin Other (See Comments)    Suicidal ideation.   Levothyroxine      Other reaction(s): hot flashes,    Lodine [Etodolac] Nausea Only    Upset Stomach    Lyrica [Pregabalin] Other (See Comments)    suicidal ideation   Morphine Sulfate Hives   Naproxen Sodium    Niacin And Related Other (See Comments)    flushing   Nitrofurantoin Hives   Pravastatin Other (See Comments)    achiness    Septra [Sulfamethoxazole-Trimethoprim]  Other (See Comments)    Stomach pains   Tramadol     Other reaction(s): Pt reports drug interaction with Paxil    Tylox [Oxycodone -Acetaminophen ] Other (See  Comments)    Hyper   Tyloxapol     Other reaction(s): hyper   Wellbutrin [Bupropion] Other (See Comments)    dizzy    Past Medical History:  Diagnosis Date   Anginal pain    on and off (11/01/2011) - anxiety related   Anxiety    takes Paxil  daily and Xanax  prn   Arthritis    knees (10/16/2013)   Chronic back pain    facet disease and bulging disc; thoracic and lower back (10/16/2013)   Constipation    r/t pain meds;takes stool softener daily   COVID 01/2022   Dysrhythmia    rapid HR on occasion-takes Propranolol  daily   Family history of anesthesia complication    :PONV; mom and sisters   History of bladder infections    > 67yr ago   History of kidney stones    Hyperlipidemia    takes LIpitor daily   Hypothyroidism    takes Synthroid  daily   Insomnia    Joint pain    Joint swelling    Muscle spasms of head or neck    lumbar and thoracic;takes Robaxin  prn   Peripheral neuropathy    PONV (postoperative nausea and vomiting)    Rosacea    Seasonal allergies    takes allegra prn   Sleep apnea     Family History  Problem Relation Age of Onset   Heart disease Mother 52   Heart failure Mother    Heart attack Father    Heart disease Father 18   Breast cancer Sister 56       recur @ age 94 (metastisized)    Social History   Socioeconomic History   Marital status: Married    Spouse name: Not on file   Number of children: Not on file   Years of education: Not on file   Highest education level: Not on file  Occupational History   Not on file  Tobacco Use   Smoking status: Never   Smokeless tobacco: Never  Vaping Use   Vaping status: Never Used  Substance and Sexual Activity   Alcohol use: No   Drug use: No   Sexual activity: Yes    Birth control/protection: Surgical  Other Topics Concern   Not on file  Social History Narrative   Not on file   Social Drivers of Health   Tobacco Use: Low Risk (01/15/2024)   Patient History    Smoking Tobacco  Use: Never    Smokeless Tobacco Use: Never    Passive Exposure: Not on file  Financial Resource Strain: Not on file  Food Insecurity: Not on file  Transportation Needs: Not on file  Physical Activity: Not on file  Stress: Not on file  Social Connections: Not on file  Intimate Partner Violence: Not on file  Depression (EYV7-0): Not on file  Alcohol Screen: Not on file  Housing: Not on file  Utilities: Not on file  Health Literacy: Not on file    Past Medical History, Surgical history, Social history, and Family history were reviewed and updated as appropriate.   Please see review of systems for further details on the patient's review from today.   Objective:   Physical Exam:  There were no vitals taken for this visit.  Physical Exam Neurological:  Mental Status: She is alert and oriented to person, place, and time.     Cranial Nerves: No dysarthria.     Motor: Tremor present.     Comments: Fine vertical tremor both hands.  Without changes  Psychiatric:        Attention and Perception: Attention and perception normal.        Mood and Affect: Mood is anxious. Mood is not depressed.        Speech: Speech normal.        Behavior: Behavior is cooperative.        Thought Content: Thought content normal. Thought content is not paranoid or delusional. Thought content does not include homicidal or suicidal ideation. Thought content does not include suicidal plan.        Cognition and Memory: Cognition and memory normal.        Judgment: Judgment normal.     Comments: Insight intact Residual anxiety     Lab Review:     Component Value Date/Time   NA 141 01/27/2023 1000   NA 141 06/15/2018 1411   K 4.0 01/27/2023 1000   CL 105 01/27/2023 1000   CO2 26 01/27/2023 1000   GLUCOSE 104 (H) 01/27/2023 1000   BUN 24 (H) 01/27/2023 1000   BUN 13 06/15/2018 1411   CREATININE 0.81 01/27/2023 1000   CALCIUM  9.5 01/27/2023 1000   PROT 6.5 10/06/2021 0852   ALBUMIN  3.9 10/06/2021  0852   AST 20 10/06/2021 0852   ALT 21 10/06/2021 0852   ALKPHOS 61 10/06/2021 0852   BILITOT 0.4 10/06/2021 0852   GFRNONAA >60 01/27/2023 1000   GFRAA >60 10/16/2018 0558       Component Value Date/Time   WBC 7.3 01/27/2023 1000   RBC 3.88 01/27/2023 1000   HGB 12.5 01/27/2023 1000   HCT 38.0 01/27/2023 1000   PLT 327 01/27/2023 1000   MCV 97.9 01/27/2023 1000   MCH 32.2 01/27/2023 1000   MCHC 32.9 01/27/2023 1000   RDW 13.6 01/27/2023 1000   LYMPHSABS 2,217 04/21/2020 1643   MONOABS 0.5 10/08/2013 1050   EOSABS 129 04/21/2020 1643   BASOSABS 48 04/21/2020 1643    No results found for: POCLITH, LITHIUM   No results found for: PHENYTOIN, PHENOBARB, VALPROATE, CBMZ   .res Assessment: Plan:    Obsessive-compulsive disorder - scrupulosity type - Plan: PARoxetine  (PAXIL ) 20 MG tablet  Generalized anxiety disorder - Plan: PARoxetine  (PAXIL ) 20 MG tablet, ALPRAZolam  (XANAX ) 0.25 MG tablet  Social anxiety disorder - Plan: PARoxetine  (PAXIL ) 20 MG tablet  Recurrent major depressive disorder, in remission - Plan: PARoxetine  (PAXIL ) 20 MG tablet  Restless legs syndrome - Plan: pramipexole  (MIRAPEX ) 0.25 MG tablet  Panic disorder with agoraphobia  Early onset dysthymia - Plan: PARoxetine  (PAXIL ) 20 MG tablet  Essential tremor   30 min of video face to face time with patient was spent on counseling and coordination of care. We discussed She overall has been satisfied with the meds in spite of being on a relatively low dose of paroxetine  40 mg given her history of treatment resistant OCD.  She  made progress in therapy that is helping.    Overall OCD is manageable but not gone.  Disc CBT and she's aware.     Can temporarily increase Xanax  0.25 mg TID - QID until hyperthyroidism resolved.  Propranolol  is also helping her anxiety and she's satisfied but wants to increase for her tremor. After one week then increase propranolol  to LA  120 mg daily. Check BP and  pulse.  Call if low.  Disc SE.  Disc alternative like primido  Her restless legs is managed as long as she takes the pramipexole .   Pt historically  needs high dosage bc of TR OCD.  But done ok with 40 mg daily until temporarily reduced to 30 mg daily.   Worse with Reduced paroxetine  to 30 mg for 1 week and didn't get back back to baseline at 40 mg daily.  OCD not as controlled.   Increase paroxetine  to 20 mg AM and 40 mg PM>  Rec trial (NAC) N-Acetylcysteine 2 of the  600 mg capsules daily to help with mild cognitive problems.  It can be combined with a B-complex vitamin as the B-12 and folate which can sometimes enhance the effect.  Follow-up 2 mos in office  Lorene Macintosh, MD, DFAPA.  Future Appointments  Date Time Provider Department Center  02/28/2024 11:00 AM [facility]    No orders of the defined types were placed in this encounter.     -------------------------------

## 2024-01-24 ENCOUNTER — Other Ambulatory Visit: Payer: Self-pay | Admitting: Nurse Practitioner

## 2024-01-26 ENCOUNTER — Encounter: Payer: Self-pay | Admitting: Psychiatry

## 2024-01-26 ENCOUNTER — Telehealth: Payer: Self-pay | Admitting: Psychiatry

## 2024-01-26 NOTE — Telephone Encounter (Signed)
 Pt seen 1/12.  Worse with Reduced paroxetine  to 30 mg for 1 week and didn't get back back to baseline at 40 mg daily.  OCD not as controlled.   Increase paroxetine  to 20 mg AM and 40 mg PM>  She is reporting decreased BP. She said she takes it at home, but not daily. I pulled some from other providers in [facility]. BP 11/2023 116/63 before the increase, 01/17/24 98/66. She provided me BP of 96/54, and today was 119/64. She said she went to get her hair done yesterday and when she stood up she almost fell. Reviewed orthostatic precautions with her. She is taking 80 mg propranolol .  Says her head feels different when her BP is low, but says it doesn't happen on a daily basis.

## 2024-01-26 NOTE — Telephone Encounter (Signed)
 Paroxetine  does not directly reduce her BP.  But it might affect the propranolol  making it stronger.  Reduce the paroxetine  to 1/2 of 20 mg in the AM and continue paroxetine  40 mg at night.   Sent message by my chart Lorene Macintosh, MD, DFAPA

## 2024-01-26 NOTE — Telephone Encounter (Signed)
 [name] called and has a question. Dr. Geoffry increased her paxil . Since she has been taking it her BP is low especially the bottom number. She is dizzy. She wants to know if this is normal and will pass. Please call her at [phone]

## 2024-01-29 ENCOUNTER — Other Ambulatory Visit: Payer: Self-pay | Admitting: Surgery

## 2024-01-29 NOTE — Telephone Encounter (Signed)
 Called patient to make sure she had seen the MyChart message and she had and is following recommendation.

## 2024-01-30 NOTE — Telephone Encounter (Signed)
 Lipid Completed on 05/05/23 [facility]

## 2024-02-05 ENCOUNTER — Ambulatory Visit: Admitting: Psychiatry

## 2024-02-28 ENCOUNTER — Other Ambulatory Visit

## 2024-03-12 ENCOUNTER — Telehealth: Admitting: Psychiatry
